# Patient Record
Sex: Male | Born: 1980 | Race: Black or African American | Hispanic: No | Marital: Married | Smoking: Current every day smoker
Health system: Southern US, Community
[De-identification: ages and names within clinical notes are randomized; demographics above are authoritative.]

## PROBLEM LIST (undated history)

## (undated) DIAGNOSIS — M5137 Other intervertebral disc degeneration, lumbosacral region: Secondary | ICD-10-CM

## (undated) DIAGNOSIS — T7840XA Allergy, unspecified, initial encounter: Secondary | ICD-10-CM

## (undated) DIAGNOSIS — F1190 Opioid use, unspecified, uncomplicated: Secondary | ICD-10-CM

## (undated) DIAGNOSIS — R519 Headache, unspecified: Secondary | ICD-10-CM

## (undated) DIAGNOSIS — R51 Headache: Secondary | ICD-10-CM

## (undated) DIAGNOSIS — M5441 Lumbago with sciatica, right side: Secondary | ICD-10-CM

## (undated) DIAGNOSIS — M5442 Lumbago with sciatica, left side: Secondary | ICD-10-CM

## (undated) DIAGNOSIS — G8929 Other chronic pain: Secondary | ICD-10-CM

## (undated) HISTORY — PX: BACK SURGERY: SHX140

## (undated) HISTORY — DX: Allergy, unspecified, initial encounter: T78.40XA

## (undated) HISTORY — DX: Other intervertebral disc degeneration, lumbosacral region: M51.37

## (undated) HISTORY — PX: FRACTURE SURGERY: SHX138

## (undated) HISTORY — DX: Opioid use, unspecified, uncomplicated: F11.90

## (undated) HISTORY — DX: Other chronic pain: G89.29

## (undated) HISTORY — DX: Lumbago with sciatica, right side: M54.41

## (undated) HISTORY — DX: Lumbago with sciatica, left side: M54.42

---
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
## 2002-10-06 ENCOUNTER — Emergency Department (HOSPITAL_COMMUNITY): Admission: EM | Admit: 2002-10-06 | Discharge: 2002-10-06 | Payer: Self-pay | Admitting: Emergency Medicine

## 2002-10-06 ENCOUNTER — Encounter: Payer: Self-pay | Admitting: Emergency Medicine

## 2006-11-13 ENCOUNTER — Emergency Department (HOSPITAL_COMMUNITY): Admission: EM | Admit: 2006-11-13 | Discharge: 2006-11-13 | Payer: Self-pay | Admitting: Emergency Medicine

## 2010-07-19 DIAGNOSIS — T7840XA Allergy, unspecified, initial encounter: Secondary | ICD-10-CM

## 2010-07-19 HISTORY — DX: Allergy, unspecified, initial encounter: T78.40XA

## 2014-05-23 DIAGNOSIS — G44011 Episodic cluster headache, intractable: Secondary | ICD-10-CM | POA: Insufficient documentation

## 2015-06-16 HISTORY — PX: SPINE SURGERY: SHX786

## 2015-08-12 ENCOUNTER — Encounter (HOSPITAL_COMMUNITY): Payer: Self-pay

## 2015-08-12 ENCOUNTER — Ambulatory Visit (HOSPITAL_COMMUNITY): Payer: BLUE CROSS/BLUE SHIELD | Attending: Neurosurgery

## 2015-08-12 DIAGNOSIS — M2560 Stiffness of unspecified joint, not elsewhere classified: Secondary | ICD-10-CM | POA: Insufficient documentation

## 2015-08-12 DIAGNOSIS — G8929 Other chronic pain: Secondary | ICD-10-CM | POA: Diagnosis present

## 2015-08-12 DIAGNOSIS — M545 Low back pain: Secondary | ICD-10-CM | POA: Insufficient documentation

## 2015-08-12 DIAGNOSIS — R269 Unspecified abnormalities of gait and mobility: Secondary | ICD-10-CM

## 2015-08-12 DIAGNOSIS — M6281 Muscle weakness (generalized): Secondary | ICD-10-CM | POA: Diagnosis present

## 2015-08-12 DIAGNOSIS — Z9889 Other specified postprocedural states: Secondary | ICD-10-CM | POA: Insufficient documentation

## 2015-08-12 DIAGNOSIS — M5126 Other intervertebral disc displacement, lumbar region: Secondary | ICD-10-CM | POA: Insufficient documentation

## 2015-08-12 NOTE — Patient Instructions (Signed)
  Isometric Transverse abdominal contraction  Lay on your back with your knees bent.    Place your thumbs on your stomach just inside your hip bones to feel the muscle contract.  Activate your abdominals by pulling everything in.  "Try to bring your naval to your spine."  Hold this contraction for 5 sec, 1 set of 15 reps, 2x/day   Learn to use this muscle with daily activities such as standing, walking, and rolling.        STRETCH - PIRIFORMIS  Start in a supine position ( on your back), one leg bent with foot flat on the bed/floor, and the other leg crossed over the knee.  Gently push the knee of the crossed leg forward until a strong yet pain-free stretch is felt.  Hold for 30 seconds then release.  Repeat 3 times and complete the stretch 2x/day      HAMSTRING STRETCH - SUPINE  While lying on your bed, hold the back of your knee/thigh area and straighten your knee until a stretch is felt along the back of your leg.Hold 30 sec and repeat 3 times in a row, Complete 2x/day

## 2015-08-13 ENCOUNTER — Encounter (HOSPITAL_COMMUNITY): Payer: Self-pay

## 2015-08-13 NOTE — Therapy (Signed)
[place] [facility] [address] [place], [place], [address] Phone: [phone]   Fax:  [phone]  Physical Therapy Evaluation  Patient Details  Name: ARRION BROADDUS MRN: [id number] Date of Birth: 21-Sep-1980 Referring Provider: Dr. Donalee Citrin  Encounter Date: 08/12/2015      PT End of Session - 08/12/15 1836    Visit Number 1   Number of Visits 13   Authorization Type [employer]    PT Start Time 1515   PT Stop Time 1620   PT Time Calculation (min) 65 min   Activity Tolerance Patient tolerated treatment well   Behavior During Therapy [facility] for tasks assessed/performed      Past Medical History  Diagnosis Date  . Allergy 2012    dogs/cats and bactirm    Past Surgical History  Procedure Laterality Date  . Fracture surgery Left     ORIF   . Spine surgery Right 06/16/2015    s/p lumbar laminectomy and microdisectomy at L5-S1 on the right    There were no vitals filed for this visit.  Visit Diagnosis:  S/P lumbar laminectomy - Plan: PT plan of care cert/re-cert  Disc displacement, lumbar - Plan: PT plan of care cert/re-cert  Chronic right-sided low back pain without sciatica - Plan: PT plan of care cert/re-cert  Muscle weakness - Plan: PT plan of care cert/re-cert  Joint stiffness of spine - Plan: PT plan of care cert/re-cert  Abnormality of gait - Plan: PT plan of care cert/re-cert      Subjective Assessment - 08/12/15 1536    Subjective Mr. Agard is a 35 yo male who c/o R-sdied LBP, low back stiffness, and generalized weakness s/p lumbar laminectomy and microdisectomy at L5-S1 on the right completed on 06/16/2015 by Dr. Wynetta Emery. Pt noted that he has not completed any formal therapy since his surgery and that he has been out of work as a result of his initial injury. Pt noted that he initially sustained his low back injury while lifting a tote that was ~30# at work on 03/24/2015. He participated with PT services for a  few weeks with symptoms worsening. Pt had a subsequent MRI that indicated a displaced disc in the lumbar spine that led him to the surgical procedure stated above. Pt noted that he has difficulty with long distance ambulation and household chores secondary to low back pain and weakness. He reports that he would like to return to work eventually. Pt was unable to recall his current pain meds and will bring them at his next visit. Pt further reported that he no longer experiences radicular pain into his R LE. According to the patient, his health benefits will drop at the beginning of February 2017 due to being out of work for an extended period of time; therefore, he further noted that he would not be able to return until the beginning of March 2017, which is when his new healthcare policy begins.    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 a few hours   How long can you stand comfortably? 30 minutes    How long can you walk comfortably? 5-10 minutes    Diagnostic tests MRI= herniated NP at L5-S1   Patient Stated Goals Pt's goal is to reduce his LBP, return to sports related activities, return to work, and to be able to play with his son.    Currently  in Pain? Yes   Pain Score 1   R-sided LBP ranges between a 1-7/10 on a VAS   Pain Location Back   Pain Orientation Posterior;Right   Pain Descriptors / Indicators Stabbing;Shooting   Pain Type Surgical pain   Pain Radiating Towards No radicular pain reported   Pain Onset Other (comment)  since surgery   Pain Frequency Intermittent   Aggravating Factors  walking, standing, bending, and sit<>stand transition    Pain Relieving Factors Prescribed pain meds, sitting, elevating legs, and biofreeze ointment    Effect of Pain on Daily Activities Pt reprots difficulty with household chores and lower body exercises.    Multiple Pain Sites No                                PT Education - 08/12/15 1552    Education provided Yes   Education Details Educated pt on log rolling technique, ice therapy (15-20 minutes, 3-4x/day), initial HEP, post-op restrictions, and PT findings    Person(s) Educated Patient   Methods Explanation;Demonstration   Comprehension Verbalized understanding;Returned demonstration          PT Short Term Goals - 08/13/15 0912    PT SHORT TERM GOAL #1   Title Patient will independently demo full understanding with initial HEP in order to progress towards improved function.    Time 2   Period Weeks   Status New   PT SHORT TERM GOAL #2   Title Patient will report decreased R-sided LBP  to 0-5/10 on a VAS in order to improve his ability to sleep with less than 2 interruptions/ night.   Baseline 2-3 interruptions/ night.   Time 3   Period Weeks   Status New   PT SHORT TERM GOAL #3   Title Patient will safely demo proper body mechanics with squats and log rolling in order to reduce stress on the lumbar spine with ADLs and household activities.    Time 2   Period Weeks   Status New           PT Long Term Goals - 08/13/15 0916    PT LONG TERM GOAL #1   Title Patient will report decreased back pain to 0-3/10 on a VAS in order to improve outdoor ambulation to >45 minutes.    Time 7   Period Weeks   Status New   PT LONG TERM GOAL #2   Title Patient will demo improved LE and core strength to 4+/5 MMT grade in order to improve tolerance with leisure activities.   Time 7   Period Weeks   Status New   PT LONG TERM GOAL #3   Title Patient will improve his FOTO to <40% limitation in order to progress towards his PLOF with ambulation, leisure activities, and household chores.    Time 7   Period Weeks   Status New   PT LONG TERM GOAL #4   Title Patient will be able to lift 15# from low counter with proper squatting technique in order to progress towards work related activities.     Time 7   Period Weeks   Status New   PT LONG TERM GOAL #5   Title Patient will improve lumbar spine AROM to [facility] in all directions within post-op restrictions.   Time 7   Period Weeks   Status New  Plan - 08/13/15 0749    Clinical Impression Statement Mr. Cruthfield is a 35 yo male who was referred to outpatient PT to initiate post-op rehab s/p lumbar laminectomy and microdisectomy at L5-S1 on the right with microdissection of the right S1 nerve root and microscopic discectomy. The patient presents with signs and symptoms that are consistent with post-op limitations s/p laminectomy and microdisectomy at L5-S1. He currently presents with impairments including R-sided LBP, impaired B hip/core weakness, limited lumbar AROM, impaired HS/quad/piriformis/ITB flexibility, and impaired posture. The pt denies radicular symptoms into either LE and noted that his radicular symptoms subsided after his surgery. The pt would benefit from skilled PT in order to improve postural alignment/awareness, improve lumbar spine AROM within post-op precautions, improve postural strength, improve hip strength, reduce LBP, and improve HS/quad/piriformis/ITB flexibility. In addition, pt would benefit from body mechanics education with lifting and work related activities in order to reduce risk for injury once returning back to work.    Pt will benefit from skilled therapeutic intervention in order to improve on the following deficits Abnormal gait;Decreased endurance;Hypomobility;Decreased scar mobility;Decreased strength;Pain;Decreased activity tolerance;Difficulty walking;Improper body mechanics;Decreased range of motion;Impaired flexibility;Postural dysfunction;Decreased mobility   Rehab Potential Good   Clinical Impairments Affecting Rehab Potential hx of lumbar disc dicplacement due to work related injury   PT Frequency 2x / week   PT Duration Other (comment)  7 weeks   PT Treatment/Interventions  Electrical Stimulation;Moist Heat;Gait training;Functional mobility training;Therapeutic activities;Therapeutic exercise;Manual techniques;Patient/family education;Scar mobilization;Passive range of motion;Taping   PT Next Visit Plan Next visit to focus on Tr abdominis activation in supine, piriformis/HS/quad stretches, education on log rolling, and manual therapy techaniues to reduce TPs. Update and review HEP. Review and add current meds to medical file.    PT Home Exercise Plan Provided this visit which includes Tr. abdominis level 1 in supine, piriformis stretch, and HS stretch in supine    Recommended Other Services None at this time    Consulted and Agree with Plan of Care Patient         Problem List There are no active problems to display for this patient.   Bonnee Quin, PT, DPT    08/13/2015, 12:55 PM  [facility] [address] [place], [place], [address] Phone: [phone]   Fax:  [phone]  Name: KOBEN DAMAN MRN: [id number] Date of Birth: 05/25/1981

## 2015-08-19 ENCOUNTER — Ambulatory Visit (HOSPITAL_COMMUNITY): Payer: BLUE CROSS/BLUE SHIELD

## 2015-08-19 DIAGNOSIS — Z9889 Other specified postprocedural states: Secondary | ICD-10-CM | POA: Diagnosis not present

## 2015-08-19 DIAGNOSIS — G8929 Other chronic pain: Secondary | ICD-10-CM

## 2015-08-19 DIAGNOSIS — M2560 Stiffness of unspecified joint, not elsewhere classified: Secondary | ICD-10-CM

## 2015-08-19 DIAGNOSIS — R269 Unspecified abnormalities of gait and mobility: Secondary | ICD-10-CM

## 2015-08-19 DIAGNOSIS — M545 Low back pain: Secondary | ICD-10-CM

## 2015-08-19 DIAGNOSIS — M6281 Muscle weakness (generalized): Secondary | ICD-10-CM

## 2015-08-19 DIAGNOSIS — M5126 Other intervertebral disc displacement, lumbar region: Secondary | ICD-10-CM

## 2015-08-19 NOTE — Therapy (Signed)
[place] [facility] [address] [place], [place], [address] Phone: [phone]   Fax:  [phone]  Physical Therapy Treatment  Patient Details  Name: Nathan Lambert MRN: [id number] Date of Birth: November 06, 1980 Referring Provider: Dr. Donalee Citrin  Encounter Date: 08/19/2015      PT End of Session - 08/19/15 1122    Visit Number 2   Number of Visits 13   Authorization Type [employer]    PT Start Time 1114   PT Stop Time 1200   PT Time Calculation (min) 46 min   Activity Tolerance Patient tolerated treatment well;No increased pain   Behavior During Therapy [facility] for tasks assessed/performed      Past Medical History  Diagnosis Date  . Allergy 2012    dogs/cats and bactirm    Past Surgical History  Procedure Laterality Date  . Fracture surgery Left     ORIF   . Spine surgery Right 06/16/2015    p lumbar laminectomy and microdisectomy at L5-S1 on the right    There were no vitals filed for this visit.  Visit Diagnosis:  S/P lumbar laminectomy  Disc displacement, lumbar  Chronic right-sided low back pain without sciatica  Muscle weakness  Joint stiffness of spine  Abnormality of gait      Subjective Assessment - 08/19/15 1117    Subjective Pt reported that his low back "feels stiff" with pain rated a 2/10 on a VAS upon arrival. R-sided LBP has ranged between a 1-7/10 on a VAS since last PT visit. Pt noted that he will not be able to return to outpatient PT until March due to issues with his insurance. Pt will plan to call the clinic back once his insurance reinitiates.    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 a few hours   How long can you stand comfortably? 30 minutes    How long can you walk comfortably? 5-10 minutes    Diagnostic tests MRI= herniated NP at L5-S1   Patient Stated Goals Pt's goal is to reduce his LBP, return to sports  related activities, return to work, and to be able to play with his son.    Currently in Pain? Yes   Pain Score 2    Pain Location Back   Pain Orientation Right   Pain Descriptors / Indicators Aching;Sharp   Pain Type Surgical pain   Pain Onset Other (comment)  Since surgery    Pain Frequency Intermittent   Aggravating Factors  walking, standing, and sitting for prolonged periods of time    Pain Relieving Factors pain meds, sitting, elevating legs, and biofreeze ointment    Effect of Pain on Daily Activities Pt reports continued difficulty with household chores   Multiple Pain Sites No                         [facility] Adult PT Treatment/Exercise - 08/19/15 0001    Lumbar Exercises: Stretches   Active Hamstring Stretch 3 reps;30 seconds;Other (comment)  bilateral    Single Knee to Chest Stretch 3 reps;30 seconds;Other (comment)  bilateral    Lower Trunk Rotation Other (comment)  1 set of 15 reps in supine   Piriformis Stretch 3 reps;30 seconds;Other (comment)  bilateral    Lumbar Exercises: Aerobic   Stationary Bike 5 minutes on level 1  at beginning of PT tx  Lumbar Exercises: Standing   Functional Squats 15 reps;Other (comment)  1 set with use of physioball in corner of room   Lumbar Exercises: Supine   Ab Set 5 seconds;20 reps   Large Ball Abdominal Isometric 15 reps;3 seconds   Manual Therapy   Passive ROM B hip PROM into flex/IR/ER x 15 reps each                 PT Education - 08/19/15 1121    Education provided Yes   Education Details Educated pt on log rolling technique, post-op restrictions, PT eval findings,updated HEP,  and heat therapy 15-20 minutes    Person(s) Educated Patient   Methods Explanation;Demonstration;Handout   Comprehension Verbalized understanding;Returned demonstration;Need further instruction          PT Short Term Goals - 08/13/15 0912    PT SHORT TERM GOAL #1   Title Patient will independently demo full  understanding with initial HEP in order to progress towards improved function.    Time 2   Period Weeks   Status New   PT SHORT TERM GOAL #2   Title Patient will report decreased R-sided LBP  to 0-5/10 on a VAS in order to improve his ability to sleep with less than 2 interruptions/ night.   Baseline 2-3 interruptions/ night.   Time 3   Period Weeks   Status New   PT SHORT TERM GOAL #3   Title Patient will safely demo proper body mechanics with squats and log rolling in order to reduce stress on the lumbar spine with ADLs and household activities.    Time 2   Period Weeks   Status New           PT Long Term Goals - 08/13/15 0916    PT LONG TERM GOAL #1   Title Patient will report decreased back pain to 0-3/10 on a VAS in order to improve outdoor ambulation to >45 minutes.    Time 7   Period Weeks   Status New   PT LONG TERM GOAL #2   Title Patient will demo improved LE and core strength to 4+/5 MMT grade in order to improve tolerance with leisure activities.   Time 7   Period Weeks   Status New   PT LONG TERM GOAL #3   Title Patient will improve his FOTO to <40% limitation in order to progress towards his PLOF with ambulation, leisure activities, and household chores.    Time 7   Period Weeks   Status New   PT LONG TERM GOAL #4   Title Patient will be able to lift 15# from low counter with proper squatting technique in order to progress towards work related activities.    Time 7   Period Weeks   Status New   PT LONG TERM GOAL #5   Title Patient will improve lumbar spine AROM to [facility] in all directions within post-op restrictions.   Time 7   Period Weeks   Status New               Plan - 08/19/15 1123    Clinical Impression Statement Initiated PT tx with 5 minute warm-up on the recumbent bike with good tolerance reported. Pt tolerated progressed core stabilization ther ex with addition of physio ball abdominal isometric in supine. SKTC, LTR, and mini squats were  added to ther ex regimen with good tolerance reported. Pt required verbal and tactile cues for proper technique and to maintain core activated.  HEP was updated this visit with addition of SKTC and mini squats. LBP was reduced to 1/10 on a VAS at the completion of PT tx. Pt is responding well to current PT POC and would benefit from continued skilled PT to further improve B LE/core strength, LE flexibility, lumbar ROM, and reduce pain. Per pt report, he will not be able to return to outpatient PT until March due to his current insurance being discontinued by his employer. Pt plans to return once his new health insurance policy begins. Pt will continue with his updated HEP until then. Excellent understanding demo and verbalized per pt with HEP.    Pt will benefit from skilled therapeutic intervention in order to improve on the following deficits Abnormal gait;Decreased endurance;Hypomobility;Decreased scar mobility;Decreased strength;Pain;Decreased activity tolerance;Difficulty walking;Improper body mechanics;Decreased range of motion;Impaired flexibility;Postural dysfunction;Decreased mobility   Rehab Potential Good   Clinical Impairments Affecting Rehab Potential hx of lumbar disc dicplacement due to work related injury   PT Frequency 2x / week   PT Duration Other (comment)  7 weeks   PT Treatment/Interventions Electrical Stimulation;Moist Heat;Gait training;Functional mobility training;Therapeutic activities;Therapeutic exercise;Manual techniques;Patient/family education;Scar mobilization;Passive range of motion;Taping   PT Next Visit Plan Next visit to focus on Tr abdominis activation in supine, piriformis/HS/quad stretches, education on log rolling, and manual therapy techaniues to reduce TPs. Update and review HEP. Review and add current meds to medical file.    PT Home Exercise Plan Reviewed HEP with addition of mini squats and SKTC stretch. Handout provided this visit with additional instructions  provided on proper technique and parameters.    Recommended Other Services None at time    Consulted and Agree with Plan of Care Patient        Problem List There are no active problems to display for this patient.  Bonnee Quin, PT, DPT    08/19/2015, 12:24 PM  [place] [address] [place], [place], [address] Phone: [phone]   Fax:  [phone]  Name: Nathan Lambert MRN: [id number] Date of Birth: 07-10-1981

## 2015-08-19 NOTE — Patient Instructions (Addendum)
  SINGLE KNEE TO CHEST STRETCH - SKTC  While Lying on your back,  hold your knee and gently pull it up towards your chest. Complete 1 set of 15 reps on both sides, complete 2-3x/day, everyday    EXERCISE BALL - PARTIAL WALL SQUATS  Start by standing up and leaning your low back up against an exercise ball in the corner of a room for safety. Your feet should be spread apart about shoulder width apart.   Next, slowly bend your knees and lower your buttocks towards the floor, but only go down to ~45 degrees/partial squat. DO NOT GO AS FAR DOWN AS THE PICTURE  Knees should bend in line with the 2nd toe and not pass the front of the foot. Complete 10-20 reps, 1-2x/day, everyday with focus on keeping your core engaged during the movement STOP the exercises if you experience pain

## 2015-09-23 ENCOUNTER — Encounter (HOSPITAL_COMMUNITY): Payer: Self-pay

## 2015-09-23 ENCOUNTER — Ambulatory Visit (HOSPITAL_COMMUNITY): Payer: BLUE CROSS/BLUE SHIELD | Attending: Neurosurgery

## 2015-09-23 DIAGNOSIS — Z9889 Other specified postprocedural states: Secondary | ICD-10-CM | POA: Diagnosis not present

## 2015-09-23 DIAGNOSIS — M545 Low back pain: Secondary | ICD-10-CM | POA: Insufficient documentation

## 2015-09-23 DIAGNOSIS — R269 Unspecified abnormalities of gait and mobility: Secondary | ICD-10-CM | POA: Diagnosis present

## 2015-09-23 DIAGNOSIS — M6281 Muscle weakness (generalized): Secondary | ICD-10-CM | POA: Diagnosis present

## 2015-09-23 DIAGNOSIS — G8929 Other chronic pain: Secondary | ICD-10-CM | POA: Insufficient documentation

## 2015-09-23 DIAGNOSIS — M2560 Stiffness of unspecified joint, not elsewhere classified: Secondary | ICD-10-CM | POA: Insufficient documentation

## 2015-09-23 DIAGNOSIS — M5126 Other intervertebral disc displacement, lumbar region: Secondary | ICD-10-CM | POA: Insufficient documentation

## 2015-09-23 NOTE — Therapy (Signed)
[facility] [address] [place], [place], [address] Phone: [phone]   Fax:  [phone]  Physical Therapy Treatment/ Physical Therapy Reassessment   Patient Details  Name: Nathan Lambert MRN: [id number] Date of Birth: Jan 31, 1981 Referring Provider: Dr. Donalee Citrin   Encounter Date: 09/23/2015      PT End of Session - 09/23/15 0944    Visit Number 3   Number of Visits 13   Date for PT Re-Evaluation 10/23/15   Authorization Type BCBS    Authorization Time Period 09/23/2015 to 11/11/2015   PT Start Time 0935   PT Stop Time 1025   PT Time Calculation (min) 50 min   Activity Tolerance Patient tolerated treatment well   Behavior During Therapy [facility] for tasks assessed/performed      Past Medical History  Diagnosis Date  . Allergy 2012    dogs/cats and bactirm    Past Surgical History  Procedure Laterality Date  . Fracture surgery Left     ORIF   . Spine surgery Right 06/16/2015    p lumbar laminectomy and microdisectomy at L5-S1 on the right    There were no vitals filed for this visit.  Visit Diagnosis:  S/P lumbar laminectomy - Plan: PT plan of care cert/re-cert  Disc displacement, lumbar - Plan: PT plan of care cert/re-cert  Chronic right-sided low back pain without sciatica - Plan: PT plan of care cert/re-cert  Muscle weakness - Plan: PT plan of care cert/re-cert  Joint stiffness of spine - Plan: PT plan of care cert/re-cert  Abnormality of gait - Plan: PT plan of care cert/re-cert      Subjective Assessment - 09/23/15 0936    Subjective Pt stated that his new health insurance ([employer]) began the beginning of this month and has returned to continue with PT services. No significant changes or improvements reported since last PT visit. Pt rated his R-sided LBP a 5-6/10 on a VAS upon arrival. Pt stated that he had a f/u appt with Dr. Wynetta Emery a few weeks ago and was told to continue with PT. Furthermore, he was instructed  by Dr. Wynetta Emery to avoid twisting and forward bending per pt report. Pt noted good compliance with his HEP, but verbalized incorrect parameters upon questioning.    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 a few hours   How long can you stand comfortably? 45 minutes    How long can you walk comfortably? 20 minutes    Diagnostic tests MRI= herniated NP at L5-S1   Patient Stated Goals Pt's goal is to reduce his LBP, return to sports related activities, return to work, and to be able to play with his son.    Currently in Pain? Yes   Pain Score 6   R-sided LBP ranges between a 2-10/10 on a VAS    Pain Location Back   Pain Orientation Right   Pain Descriptors / Indicators Aching;Sharp   Pain Type Surgical pain   Pain Radiating Towards No radicualr pain reported    Pain Onset More than a month ago   Pain Frequency Intermittent   Aggravating Factors  walking, standing, and sitting for prolonged periods of time   Pain Relieving Factors pain meds, sitting, and elevating B LE   Effect of Pain on Daily Activities Pt reports continued difficulty with household chores    Multiple Pain Sites No  [facility] PT Assessment - 09/23/15 0001    Assessment   Medical Diagnosis Lumbago   Referring Provider Dr. Donalee CitrinGary Cram    Onset Date/Surgical Date 06/16/15   Hand Dominance Right   Next MD Visit 11/02/2015   Prior Therapy PT with different clinic prior to his recent surgery    Precautions   Precautions Back  s/p Laminectomy and microdisectomy   Restrictions   Weight Bearing Restrictions No   Prior Function   Level of Independence Independent   Vocation On disability  short-term    Cognition   Overall Cognitive Status Within Functional Limits for tasks assessed   Attention Focused   Focused Attention Appears intact   Memory Appears intact   Awareness Appears intact   Problem Solving Appears intact    Observation/Other Assessments   Focus on Therapeutic Outcomes (FOTO)  Not completed this vist   Sensation   Light Touch Appears Intact   ROM / Strength   AROM / PROM / Strength AROM   AROM   AROM Assessment Site Lumbar   Lumbar Flexion NT   Lumbar Extension 8   Lumbar - Right Side Bend 10   Lumbar - Left Side Bend 10   Strength   Right Hip Flexion 4/5   Right Hip Extension 3/5   Right Hip ABduction 4/5   Left Hip Flexion 5/5   Left Hip Extension 3/5   Left Hip ABduction 4/5   Right Knee Flexion 5/5   Right Knee Extension 5/5   Left Knee Flexion 5/5   Left Knee Extension 5/5   Right Ankle Dorsiflexion 5/5   Right Ankle Plantar Flexion 5/5   Right Ankle Inversion 5/5   Right Ankle Eversion 5/5   Left Ankle Dorsiflexion 5/5   Left Ankle Plantar Flexion 5/5   Left Ankle Inversion 5/5   Left Ankle Eversion 5/5   Flexibility   Hamstrings -10 deg, bilateral    Quadriceps 50% limited   Piriformis 50% limited, bilateral           Transfers   Transfers Sit to Stand;Stand to Sit;Supine to Sit;Sit to Supine   Sit to Stand 7: Independent   Stand to Sit 7: Independent   Supine to Sit 5: Supervision   Supine to Sit Details Other (comment)  cues for log rolling   Sit to Supine 5: Supervision   Ambulation/Gait   Gait Pattern --  Right                     [facility] Adult PT Treatment/Exercise - 09/23/15 0001    Lumbar Exercises: Stretches   Passive Hamstring Stretch 3 reps;30 seconds   Passive Hamstring Stretch Limitations bilateral in supine position    Single Knee to Chest Stretch 3 reps;30 seconds;Other (comment)  bilateral    Lower Trunk Rotation Other (comment)  1 set of 15 reps in supine   Piriformis Stretch 3 reps;30 seconds;Other (comment)  bilateral    Lumbar Exercises: Aerobic   Stationary Bike 5 minutes on level 2  at end of PT tx           Lumbar Exercises: Supine   Ab Set 5 seconds;10 reps   Bent Knee Raise 15 reps;5 seconds  Core bracing    Bent Knee Raise Limitations with alternating shoulder flexion    Large Ball Abdominal Isometric 15 reps;3 seconds   Lumbar Exercises: Sidelying   Clam 15 reps;Other (comment)  bilateral    Clam Limitations red thera-band  Modalities   Modalities Moist Heat   Moist Heat Therapy   Number Minutes Moist Heat 10 Minutes  in seated position at the end of PT tx; Skin was WNL   Moist Heat Location Lumbar Spine   Manual Therapy   Passive ROM B hip PROM into flex/IR/ER x 15 reps each                 PT Education - 09/23/15 0942    Education provided Yes   Education Details Educated pt on log rolling, post-op restrictions,current HEP, and heat therapy 15-20 minutes, 3-4x/day to manage pain and muscle spasms    Person(s) Educated Patient   Methods Explanation;Demonstration   Comprehension Verbalized understanding;Returned demonstration          PT Short Term Goals - 09/23/15 1009    PT SHORT TERM GOAL #1   Title Patient will independently demo full understanding with initial HEP in order to progress towards improved function.    Baseline Pt required vebal cues for proper technique and parameters   Time 2   Period Weeks   Status On-going   PT SHORT TERM GOAL #2   Title Patient will report decreased R-sided LBP  to 0-5/10 on a VAS in order to improve his ability to sleep with less than 2 interruptions/ night.   Baseline 2-3 interruptions/ night.   Time 3   Period Weeks   Status On-going   PT SHORT TERM GOAL #3   Title Patient will safely demo proper body mechanics with squats and log rolling in order to reduce stress on the lumbar spine with ADLs and household activities.    Baseline Fair technique demo with log rolling with verbal cues to keep core braced    Time 2   Period Weeks   Status On-going           PT Long Term Goals - 09/23/15 1010    PT LONG TERM GOAL #1   Title Patient will report decreased back pain to 0-3/10 on a VAS in order to improve outdoor  ambulation to >45 minutes.    Baseline LBP ranges between a 2-10/10 on a VAS   Time 7   Period Weeks   Status On-going   PT LONG TERM GOAL #2   Title Patient will demo improved LE and core strength to 4+/5 MMT grade in order to improve tolerance with leisure activities.   Time 7   Period Weeks   Status On-going   PT LONG TERM GOAL #3   Title Patient will improve his FOTO to <40% limitation in order to progress towards his PLOF with ambulation, leisure activities, and household chores.    Baseline Not formally assessed this visit   Time 7   Period Weeks   Status On-going   PT LONG TERM GOAL #4   Title Patient will be able to lift 15# from low counter with proper squatting technique in order to progress towards work related activities.    Baseline Not formally assessed this visit   Time 7   Period Weeks   Status On-going   PT LONG TERM GOAL #5   Title Patient will improve lumbar spine AROM to [facility] in all directions within post-op restrictions.   Time 7   Period Weeks   Status On-going               Plan - 09/23/15 0945    Clinical Impression Statement Mr. Thrun is a 35 yo male who  has not been seen at PT for the last month due to his health insurance expiring; however, the pt returns to PT to resume with his POC with new health insurance in effect since September 17, 2015. Mr. Jeraldine Loots was initially referred to outpatient PT to s/p lumbar laminectomy and microdisectomy at L5-S1 on the right with microdissection of the right S1 nerve root and microscopic discectomy. The pt has demo slight improvement with B hip strength and less guarded movements with functional mobility activities. In addition, his B HS flexibility has improved. Minor progress assessed towards stated goals.  He continues to present with signs and symptoms that are consistent with post-op limitations s/p laminectomy and microdisectomy at L5-S1 including R-sided LBP, impaired B hip/core weakness, limited lumbar  AROM, impaired HS/quad/piriformis/ITB flexibility, and impaired posture. The pt denies radicular symptoms into either LE. PT tx was focused on progressed core stabilization in supine with alternating hip/shoulder flexion and resisted hip ER in S/L position. Pt tolerated progressed ther ex with no issues reported per pt. Pt required tactile and verbal cues for proper core activation and to avoid holding his breath. Improved technique assessed with subsequent reps. R-sided LBP was rated a 5/10 on a VAS at the completion of today's PT tx. Initiated moist heat to the lumbar spine region in order to reduce muscle guarding of erector spinae and to reduce pain. Pt noted minor relief post moist heat tx. Pt would benefit from continued skilled PT to focus on core stabilization/strengthening ther ex, hip strengthening, B LE stretches, education on body mechanics, and lumbar ROM within post-op protocol/MD orders. Pt is in agreement with continued skilled PT with current POC.    Pt will benefit from skilled therapeutic intervention in order to improve on the following deficits Abnormal gait;Decreased endurance;Hypomobility;Decreased scar mobility;Decreased strength;Pain;Decreased activity tolerance;Difficulty walking;Improper body mechanics;Decreased range of motion;Impaired flexibility;Postural dysfunction;Decreased mobility   Rehab Potential Good   Clinical Impairments Affecting Rehab Potential hx of lumbar disc dicplacement due to work related injury   PT Frequency 2x / week   PT Duration Other (comment)  7 weeks    PT Treatment/Interventions Electrical Stimulation;Moist Heat;Gait training;Functional mobility training;Therapeutic activities;Therapeutic exercise;Manual techniques;Patient/family education;Scar mobilization;Passive range of motion;Taping   PT Next Visit Plan Next visit to focus on Tr abdominis activation in supine, piriformis/HS/quad stretches, education on log rolling, and manual therapy techaniues to  reduce TPs. Update and review HEP. Review and add current meds to medical file.    PT Home Exercise Plan Reviewed HEP with no changes made this visit    Consulted and Agree with Plan of Care Patient        Problem List There are no active problems to display for this patient.   Bonnee Quin, PT, DPT   09/23/2015, 12:55 PM  [facility] [address] [place], [place], [address] Phone: [phone]   Fax:  [phone]  Name: UZOMA VIVONA MRN: [id number] Date of Birth: 12-23-1980

## 2015-09-25 ENCOUNTER — Ambulatory Visit (HOSPITAL_COMMUNITY): Payer: BLUE CROSS/BLUE SHIELD

## 2015-09-25 DIAGNOSIS — R269 Unspecified abnormalities of gait and mobility: Secondary | ICD-10-CM

## 2015-09-25 DIAGNOSIS — M545 Low back pain: Secondary | ICD-10-CM

## 2015-09-25 DIAGNOSIS — Z9889 Other specified postprocedural states: Secondary | ICD-10-CM

## 2015-09-25 DIAGNOSIS — M2560 Stiffness of unspecified joint, not elsewhere classified: Secondary | ICD-10-CM

## 2015-09-25 DIAGNOSIS — M6281 Muscle weakness (generalized): Secondary | ICD-10-CM

## 2015-09-25 DIAGNOSIS — M5126 Other intervertebral disc displacement, lumbar region: Secondary | ICD-10-CM

## 2015-09-25 DIAGNOSIS — G8929 Other chronic pain: Secondary | ICD-10-CM

## 2015-09-25 NOTE — Therapy (Signed)
[facility] [address] [place], [place], [address] Phone: [phone]   Fax:  [phone]  Physical Therapy Treatment  Patient Details  Name: Nathan Lambert MRN: [id number] Date of Birth: 1980/11/19 Referring Provider: Dr. Donalee Citrin   Encounter Date: 09/25/2015      PT End of Session - 09/25/15 0950    Visit Number 4   Number of Visits 13   Date for PT Re-Evaluation 10/23/15   Authorization Type BC[employer]    Authorization Time Period 09/23/2015 to 11/11/2015   PT Start Time 0933   PT Stop Time 1025   PT Time Calculation (min) 52 min   Activity Tolerance Patient tolerated treatment well   Behavior During Therapy [facility] for tasks assessed/performed      Past Medical History  Diagnosis Date  . Allergy 2012    dogs/cats and bactirm    Past Surgical History  Procedure Laterality Date  . Fracture surgery Left     ORIF   . Spine surgery Right 06/16/2015    p lumbar laminectomy and microdisectomy at L5-S1 on the right    There were no vitals filed for this visit.  Visit Diagnosis:  S/P lumbar laminectomy  Disc displacement, lumbar  Chronic right-sided low back pain without sciatica  Muscle weakness  Joint stiffness of spine  Abnormality of gait      Subjective Assessment - 09/25/15 0941    Subjective LBP rated a 4-5/10 on a VAS upon arrival with c/o low back stiffness. No changes noted since last PT visit. Pt reported good tolerance with last PT visit with no residual soreness experienced. Pt noted that he forgot to bring in his med list and will plan to bring it to his next PT visit.    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 a few hours   How long can you stand comfortably? 45 minutes    How long can you walk comfortably? 20 minutes    Diagnostic tests MRI= herniated NP at L5-S1   Patient Stated Goals Pt's goal is to reduce his LBP,  return to sports related activities, return to work, and to be able to play with his son.    Pain Score 5   R-sided LBP has ranged between 2-7/10 on a VAS since last PT visit    Pain Location Back   Pain Orientation Right   Pain Descriptors / Indicators Aching   Pain Type Surgical pain   Pain Radiating Towards No radicular pain    Pain Onset More than a month ago   Pain Frequency Intermittent   Aggravating Factors  walking, standing, and sitting for prolonged periods of time    Pain Relieving Factors lying in supine, pain meds, and LE elevation    Effect of Pain on Daily Activities limited with household chores and long distance ambulation    Multiple Pain Sites No                         [facility] Adult PT Treatment/Exercise - 09/25/15 0001                                      Lumbar Exercises: Stretches   Passive Hamstring Stretch 3 reps;30 seconds   Passive Hamstring Stretch Limitations bilateral in supine  position    Single Knee to Chest Stretch 3 reps;30 seconds;Other (comment)  bilateral    Lower Trunk Rotation Other (comment)  1 set of 15 reps in supine   Piriformis Stretch 3 reps;30 seconds;Other (comment)  bilateral    Lumbar Exercises: Aerobic   Stationary Bike 6 minutes on level 3  at end of PT tx    Lumbar Exercises: Standing   Functional Squats 15 reps;Other (comment)  1 set with use of physioball in corner of room   Lumbar Exercises: Supine   Ab Set 5 seconds;15 reps   Bent Knee Raise 15 reps;5 seconds  Core bracing   Bent Knee Raise Limitations with alternating shoulder and hip flexion        Lumbar Exercises: Sidelying   Clam 15 reps;Other (comment)  bilateral    Clam Limitations red thera-band           Modalities   Modalities Moist Heat   Moist Heat Therapy   Number Minutes Moist Heat 10 Minutes  at the end of PT tx    Moist Heat Location Lumbar Spine   Manual Therapy   Manual Therapy Soft tissue mobilization   Manual therapy  comments Completed independently from other interventions    Soft tissue mobilization STM to bilateral paraspinal muscles with palmar spreading technique    Passive ROM B hip PROM into flex/IR/ER x 15 reps each                 PT Education - 09/25/15 0949    Education provided Yes   Education Details Educated pt on log rolling technique, post-op restrictions, current HEP, avoid prolonged sitting, and heat therapy    Person(s) Educated Patient   Methods Explanation;Demonstration   Comprehension Returned demonstration;Verbalized understanding          PT Short Term Goals - 09/23/15 1009    PT SHORT TERM GOAL #1   Title Patient will independently demo full understanding with initial HEP in order to progress towards improved function.    Baseline Pt required vebal cues for proper technique and parameters   Time 2   Period Weeks   Status On-going   PT SHORT TERM GOAL #2   Title Patient will report decreased R-sided LBP  to 0-5/10 on a VAS in order to improve his ability to sleep with less than 2 interruptions/ night.   Baseline 2-3 interruptions/ night.   Time 3   Period Weeks   Status On-going   PT SHORT TERM GOAL #3   Title Patient will safely demo proper body mechanics with squats and log rolling in order to reduce stress on the lumbar spine with ADLs and household activities.    Baseline Fair technique demo with log rolling with verbal cues to keep core braced    Time 2   Period Weeks   Status On-going           PT Long Term Goals - 09/23/15 1010    PT LONG TERM GOAL #1   Title Patient will report decreased back pain to 0-3/10 on a VAS in order to improve outdoor ambulation to >45 minutes.    Baseline LBP ranges between a 2-10/10 on a VAS   Time 7   Period Weeks   Status On-going   PT LONG TERM GOAL #2   Title Patient will demo improved LE and core strength to 4+/5 MMT grade in order to improve tolerance with leisure activities.   Time 7  Period Weeks    Status On-going   PT LONG TERM GOAL #3   Title Patient will improve his FOTO to <40% limitation in order to progress towards his PLOF with ambulation, leisure activities, and household chores.    Baseline Not formally assessed this visit   Time 7   Period Weeks   Status On-going   PT LONG TERM GOAL #4   Title Patient will be able to lift 15# from low counter with proper squatting technique in order to progress towards work related activities.    Baseline Not formally assessed this visit   Time 7   Period Weeks   Status On-going   PT LONG TERM GOAL #5   Title Patient will improve lumbar spine AROM to [facility] in all directions within post-op restrictions.   Time 7   Period Weeks   Status On-going               Plan - 09/25/15 5409    Clinical Impression Statement PT tx focused on LE stretches, core stabilization ther ex, gluteal strengthening, and manual techniques to reduce pain and TPs. Palpable tenderness assessed t/o lumbar paraspinals and quadratus lumborum. Added and completed STM to lumbar paraspinals in L S/L position with TPs reduced. Improved technique and understanding demo with supine core bracing ther ex with minor cues required for proper sequence and to avoid holding breath. Therapist encouraged and instructed the pt to avoid long durations with sitting and to ambulate tolerable distances daily in order to improve tolerance with community ambulation. Pt verbalized full understanding and admitted that he watches television and plays video games for the majority of the day. Reintroduced mini squats with physioball on wall with fair tolerance reported. Pt required verbal and visual cues for proper technique. Improved tolerance reported once cues were provided. Ended PT tx with moist heat therapy to the lumbar spine in a seated position. LBP was rated a 4-5/10 on a VAS at the end of today's visit. Continue with current POC.    Pt will benefit from skilled therapeutic intervention  in order to improve on the following deficits Abnormal gait;Decreased endurance;Hypomobility;Decreased scar mobility;Decreased strength;Pain;Decreased activity tolerance;Difficulty walking;Improper body mechanics;Decreased range of motion;Impaired flexibility;Postural dysfunction;Decreased mobility   Rehab Potential Good   Clinical Impairments Affecting Rehab Potential hx of lumbar disc dicplacement due to work related injury   PT Frequency 2x / week   PT Duration Other (comment)  7 weeks    PT Treatment/Interventions Electrical Stimulation;Moist Heat;Gait training;Functional mobility training;Therapeutic activities;Therapeutic exercise;Manual techniques;Patient/family education;Scar mobilization;Passive range of motion;Taping   PT Next Visit Plan Next visit to focus on Tr abdominis activation in supine, piriformis/HS/quad stretches, manual therapy techaniues to reduce TPs, and IFC e-stim to reduce pain. Update and review HEP. Review and add current meds to medical file once pt brings in med list.    PT Home Exercise Plan Reviewed HEP with addition of alternating hip and shoulder flexion with core stabilization in supine    Consulted and Agree with Plan of Care Patient        Problem List There are no active problems to display for this patient.   Bonnee Quin, PT, DPT  09/25/2015, 12:33 PM  [facility] [address] [place], [place], [address] Phone: [phone]   Fax:  [phone]  Name: ALDOUS HOUSEL MRN: [id number] Date of Birth: Feb 25, 1981

## 2015-09-30 ENCOUNTER — Ambulatory Visit (HOSPITAL_COMMUNITY): Payer: BLUE CROSS/BLUE SHIELD | Admitting: Physical Therapy

## 2015-09-30 DIAGNOSIS — M545 Low back pain, unspecified: Secondary | ICD-10-CM

## 2015-09-30 DIAGNOSIS — Z9889 Other specified postprocedural states: Secondary | ICD-10-CM | POA: Diagnosis not present

## 2015-09-30 DIAGNOSIS — M6281 Muscle weakness (generalized): Secondary | ICD-10-CM

## 2015-09-30 DIAGNOSIS — M5126 Other intervertebral disc displacement, lumbar region: Secondary | ICD-10-CM

## 2015-09-30 DIAGNOSIS — R269 Unspecified abnormalities of gait and mobility: Secondary | ICD-10-CM

## 2015-09-30 DIAGNOSIS — M2560 Stiffness of unspecified joint, not elsewhere classified: Secondary | ICD-10-CM

## 2015-09-30 DIAGNOSIS — G8929 Other chronic pain: Secondary | ICD-10-CM

## 2015-09-30 NOTE — Therapy (Signed)
[place] [facility] [address] [place], [place], [address] Phone: [phone]   Fax:  [phone]  Physical Therapy Treatment  Patient Details  Name: Nathan Lambert MRN: [id number] Date of Birth: 06-18-1981 Referring Provider: Dr. Donalee Citrin   Encounter Date: 09/30/2015      PT End of Session - 09/30/15 1204    Visit Number 5   Number of Visits 13   Date for PT Re-Evaluation 10/23/15   Authorization Type BCBS    Authorization Time Period 09/23/2015 to 11/11/2015   PT Start Time 1107   PT Stop Time 1151   PT Time Calculation (min) 44 min   Activity Tolerance Patient tolerated treatment well   Behavior During Therapy [facility] for tasks assessed/performed      Past Medical History  Diagnosis Date  . Allergy 2012    dogs/cats and bactirm    Past Surgical History  Procedure Laterality Date  . Fracture surgery Left     ORIF   . Spine surgery Right 06/16/2015    p lumbar laminectomy and microdisectomy at L5-S1 on the right    There were no vitals filed for this visit.  Visit Diagnosis:  S/P lumbar laminectomy  Disc displacement, lumbar  Chronic right-sided low back pain without sciatica  Muscle weakness  Joint stiffness of spine  Abnormality of gait      Subjective Assessment - 09/30/15 1108    Subjective Trying to walk more but it was really tough. Able to walk for about 10 minutes before back tightens up and then has to sit down. Trying to get up about every 30 minutes.    Currently in Pain? Yes   Pain Score 5    Pain Location Back   Pain Orientation Lower;Medial   Pain Descriptors / Indicators Aching   Pain Type Surgical pain   Pain Radiating Towards no radicular pain   Aggravating Factors  walking, laying flat, staying in one position too long   Pain Relieving Factors sit in recliner, pain pills                         [facility] Adult PT Treatment/Exercise - 09/30/15 0001    Ambulation/Gait   Ambulation/Gait Yes   Ambulation/Gait Assistance 7: Independent   Ambulation Distance (Feet) 250 Feet   Gait Comments Lt leg external rotation, minimal trunk rotation and arm swing. Encouraging core activation during ambulation   Posture/Postural Control   Posture Comments Discussing posture and positioning at home. Pt describes maintaining lateral shift Lt. Encouraging neurtal position when sitting.    Exercises   Other Exercises  seated abdominal activation 1x10   Lumbar Exercises: Stretches   Active Hamstring Stretch 3 reps;30 seconds;Other (comment)  cues for core activation when lifting/lowering   Passive Hamstring Stretch 3 reps;30 seconds   Passive Hamstring Stretch Limitations bilateral in supine position    Single Knee to Chest Stretch 3 reps;30 seconds;Other (comment)  bilateral    Piriformis Stretch 3 reps;30 seconds;Other (comment)  bilateral    Lumbar Exercises: Seated   Sit to Stand 5 reps   Sit to Stand Limitations focus on core activation.   Lumbar Exercises: Supine   Ab Set 5 seconds;15 reps;Other (comment)  1X15, 1X10   Bent Knee Raise 10 reps;3 seconds   Bent Knee Raise Limitations cues for abdominal activation                PT Education - 09/30/15 1203  Education provided Yes   Education Details education on anatomy of spine and core activation to stabilize, seated abdominal activation, ambulation with activation. Encouraging ambulation and positioning.    Person(s) Educated Patient   Methods Explanation;Demonstration;Tactile cues;Verbal cues   Comprehension Verbalized understanding;Returned demonstration          PT Short Term Goals - 09/23/15 1009    PT SHORT TERM GOAL #1   Title Patient will independently demo full understanding with initial HEP in order to progress towards improved function.    Baseline Pt required vebal cues for proper technique and parameters   Time 2   Period Weeks   Status On-going   PT SHORT TERM GOAL #2   Title  Patient will report decreased R-sided LBP  to 0-5/10 on a VAS in order to improve his ability to sleep with less than 2 interruptions/ night.   Baseline 2-3 interruptions/ night.   Time 3   Period Weeks   Status On-going   PT SHORT TERM GOAL #3   Title Patient will safely demo proper body mechanics with squats and log rolling in order to reduce stress on the lumbar spine with ADLs and household activities.    Baseline Fair technique demo with log rolling with verbal cues to keep core braced    Time 2   Period Weeks   Status On-going           PT Long Term Goals - 09/23/15 1010    PT LONG TERM GOAL #1   Title Patient will report decreased back pain to 0-3/10 on a VAS in order to improve outdoor ambulation to >45 minutes.    Baseline LBP ranges between a 2-10/10 on a VAS   Time 7   Period Weeks   Status On-going   PT LONG TERM GOAL #2   Title Patient will demo improved LE and core strength to 4+/5 MMT grade in order to improve tolerance with leisure activities.   Time 7   Period Weeks   Status On-going   PT LONG TERM GOAL #3   Title Patient will improve his FOTO to <40% limitation in order to progress towards his PLOF with ambulation, leisure activities, and household chores.    Baseline Not formally assessed this visit   Time 7   Period Weeks   Status On-going   PT LONG TERM GOAL #4   Title Patient will be able to lift 15# from low counter with proper squatting technique in order to progress towards work related activities.    Baseline Not formally assessed this visit   Time 7   Period Weeks   Status On-going   PT LONG TERM GOAL #5   Title Patient will improve lumbar spine AROM to [facility] in all directions within post-op restrictions.   Time 7   Period Weeks   Status On-going               Plan - 09/30/15 1205    Clinical Impression Statement Patient having difficulty with core activation and activities such as raising legs, ambulation and transfers. Spent  extensive time on education and technique. Pt admits to not being consistent with HEP, encouraged pt to work on HEP and continue ambulation. Discussed seated core activation to enable pt to perform more consistenly during the day. Pt remains appropriate for continued PT sessions.    PT Next Visit Plan cont with abdominal activation with functional movement, reinforce HEP/ambulation at home. Trial e-stim for muscle guarding.  PT Home Exercise Plan encourage HEP, started seated abdominal sets this session.    Consulted and Agree with Plan of Care Patient        Problem List There are no active problems to display for this patient.   Christiane HaBenjamin J. Preciosa Bundrick, PT, CSCS Pager [phone] Office [phone]  09/30/2015, 12:10 PM  [place] [facility] [address] [place], [place], [address] Phone: [phone]   Fax:  [phone]  Name: Nathan Lambert MRN: [id number] Date of Birth: Aug 31, 1980

## 2015-10-02 ENCOUNTER — Encounter (HOSPITAL_COMMUNITY): Payer: BLUE CROSS/BLUE SHIELD | Admitting: Physical Therapy

## 2015-10-07 ENCOUNTER — Ambulatory Visit (HOSPITAL_COMMUNITY): Payer: BLUE CROSS/BLUE SHIELD

## 2015-10-07 DIAGNOSIS — Z9889 Other specified postprocedural states: Secondary | ICD-10-CM

## 2015-10-07 DIAGNOSIS — M6281 Muscle weakness (generalized): Secondary | ICD-10-CM

## 2015-10-07 DIAGNOSIS — M5126 Other intervertebral disc displacement, lumbar region: Secondary | ICD-10-CM

## 2015-10-07 DIAGNOSIS — M545 Low back pain: Secondary | ICD-10-CM

## 2015-10-07 DIAGNOSIS — G8929 Other chronic pain: Secondary | ICD-10-CM

## 2015-10-07 DIAGNOSIS — M2560 Stiffness of unspecified joint, not elsewhere classified: Secondary | ICD-10-CM

## 2015-10-07 DIAGNOSIS — R269 Unspecified abnormalities of gait and mobility: Secondary | ICD-10-CM

## 2015-10-07 NOTE — Therapy (Signed)
[facility] [address] [place], [place], [address] Phone: [phone]   Fax:  [phone]  Physical Therapy Treatment  Patient Details  Name: Nathan Lambert MRN: [id number] Date of Birth: 11-06-1980 Referring Provider: Dr. Donalee CitrinGary Cram   Encounter Date: 10/07/2015      PT End of Session - 10/07/15 1114    Visit Number 6   Number of Visits 13   Date for PT Re-Evaluation 10/23/15   Authorization Type BCBS    Authorization Time Period 09/23/2015 to 11/11/2015   PT Start Time 1103   PT Stop Time 1150   PT Time Calculation (min) 47 min   Activity Tolerance Patient tolerated treatment well   [facility] tasks assessed/performed      Past Medical History  Diagnosis Date  . Allergy 2012    dogs/cats and bactirm    Past Surgical History  Procedure Laterality Date  . Fracture surgery Left     ORIF   . Spine surgery Right 06/16/2015    p lumbar laminectomy and microdisectomy at L5-S1 on the right    There were no vitals filed for this visit.  Visit Diagnosis:  S/P lumbar laminectomy  Disc displacement, lumbar  Chronic right-sided low back pain without sciatica  Muscle weakness  Joint stiffness of spine  Abnormality of gait      Subjective Assessment - 10/07/15 1107    Subjective Pt reported "my back is feeling better" and that he is able to walk further distances with less severe LBP. He reports good compliance with his HEP and ambulating on a more frequent basis as requested by the therapist. R-sided LBP was rated 4/10 on a VAS upon arrival.    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 a few hours   How long can you stand comfortably? 45 minutes    How long can you walk comfortably? 20 minutes    Diagnostic tests MRI= herniated NP at L5-S1   Patient Stated Goals Pt's goal is to reduce his LBP, return to sports related  activities, return to work, and to be able to play with his son.    Currently in Pain? Yes   Pain Score 4   LBP ranges 1-7/10 on a VAS    Pain Location Back   Pain Orientation Right;Lower;Medial   Pain Descriptors / Indicators Aching   Pain Type Surgical pain   Pain Radiating Towards No radicular pain    Pain Onset More than a month ago   Pain Frequency Intermittent   Aggravating Factors  long distance ambulation and staying in one position for too long    Pain Relieving Factors prescribed pain meds and sitting   Effect of Pain on Daily Activities limited with household chores and long distance ambulation    Multiple Pain Sites No                         [facility] Adult PT Treatment/Exercise - 10/07/15 0001    Transfers   Transfers Sit to Stand;Stand to Sit;Supine to Sit;Sit to Supine   Sit to Stand 7: Independent   Stand to Sit 7: Independent   Supine to Sit 5: Supervision   Supine to Sit Details Other (comment)  cues for log rolling   Sit to Supine 5: Supervision   Ambulation/Gait   Gait Pattern --  Right  Lumbar Exercises: Stretches   Passive Hamstring Stretch 3 reps;30 seconds   Passive Hamstring Stretch Limitations bilateral in supine position    Single Knee to Chest Stretch 3 reps;30 seconds;Other (comment)  bilateral    Lower Trunk Rotation Other (comment)  1 set of 15 reps in supine   Piriformis Stretch 3 reps;30 seconds;Other (comment)  bilateral    Lumbar Exercises: Aerobic   Stationary Bike 7 minutes on level 3  at beginning of PT tx    Lumbar Exercises: Standing   Functional Squats 15 reps;Other (comment)  1 set with use of physioball in corner of room   Other Standing Lumbar Exercises Standing rows with red thera-band x 2 sets of 10 reps    Lumbar Exercises: Supine   Ab Set 5 seconds;15 reps;Other (comment)  with B shoulder flexion to 90 degrees               Other Supine Lumbar Exercises Seated core bracing with shd flexion to 90 degrees  with no additional resistance, x 2 sets of 10 reps    Lumbar Exercises: Sidelying   Clam Other (comment);20 reps  bilateral    Clam Limitations red thera-band    Manual Therapy    Passive ROM B hip PROM into flex/IR/ER x 15 reps each                 PT Education - 10/07/15 1113    Education provided Yes   Education Details Educated pt on current HEP, core bracing, and walking program    Person(s) Educated Patient   Methods Explanation;Demonstration;Tactile cues   Comprehension Verbalized understanding;Returned demonstration          PT Short Term Goals - 09/23/15 1009    PT SHORT TERM GOAL #1   Title Patient will independently demo full understanding with initial HEP in order to progress towards improved function.    Baseline Pt required vebal cues for proper technique and parameters   Time 2   Period Weeks   Status On-going   PT SHORT TERM GOAL #2   Title Patient will report decreased R-sided LBP  to 0-5/10 on a VAS in order to improve his ability to sleep with less than 2 interruptions/ night.   Baseline 2-3 interruptions/ night.   Time 3   Period Weeks   Status On-going   PT SHORT TERM GOAL #3   Title Patient will safely demo proper body mechanics with squats and log rolling in order to reduce stress on the lumbar spine with ADLs and household activities.    Baseline Fair technique demo with log rolling with verbal cues to keep core braced    Time 2   Period Weeks   Status On-going           PT Long Term Goals - 09/23/15 1010    PT LONG TERM GOAL #1   Title Patient will report decreased back pain to 0-3/10 on a VAS in order to improve outdoor ambulation to >45 minutes.    Baseline LBP ranges between a 2-10/10 on a VAS   Time 7   Period Weeks   Status On-going   PT LONG TERM GOAL #2   Title Patient will demo improved LE and core strength to 4+/5 MMT grade in order to improve tolerance with leisure activities.   Time 7   Period Weeks   Status On-going    PT LONG TERM GOAL #3   Title Patient will improve his FOTO to <  40% limitation in order to progress towards his PLOF with ambulation, leisure activities, and household chores.    Baseline Not formally assessed this visit   Time 7   Period Weeks   Status On-going   PT LONG TERM GOAL #4   Title Patient will be able to lift 15# from low counter with proper squatting technique in order to progress towards work related activities.    Baseline Not formally assessed this visit   Time 7   Period Weeks   Status On-going   PT LONG TERM GOAL #5   Title Patient will improve lumbar spine AROM to [facility] in all directions within post-op restrictions.   Time 7   Period Weeks   Status On-going               Plan - 10/07/15 1115    Clinical Impression Statement PT tx was focused on core stabilization/postural strengthening ther ex, LE stretches, and hip strengthening. Pt ambulated into the clinic with less postural guarding with improved reciprocal arm swing. Reviewed and completed core stabilization ther ex with verbal and tactile cues required for proper technique. Progressed core stabilization in seated position with shoulder flexion. Pt demo improved technique and core activation compared to previous PT visits. Added postural strengthening ther ex with addition of seated rows with red thera-band. Good tolerance reported and assessed with added reps and progressed ther ex. Pt is making slow, but steady progress towards stated PT goals with less severe LBP, less postural guarding, and improved tolerance with ambulation. LBP was rated a 3-4/10 on a VAS at the completion of PT tx. Pt would benefit from continued skilled PT to address strength, flexibility, mobility, and pain deficits. Continue with current POC.    Pt will benefit from skilled therapeutic intervention in order to improve on the following deficits Abnormal gait;Decreased endurance;Hypomobility;Decreased scar mobility;Decreased  strength;Pain;Decreased activity tolerance;Difficulty walking;Improper body mechanics;Decreased range of motion;Impaired flexibility;Postural dysfunction;Decreased mobility   Clinical Impairments Affecting Rehab Potential hx of lumbar disc dicplacement due to work related injury   PT Frequency 2x / week   PT Duration Other (comment)  7 weeks    PT Treatment/Interventions Electrical Stimulation;Moist Heat;Gait training;Functional mobility training;Therapeutic activities;Therapeutic exercise;Manual techniques;Patient/family education;Scar mobilization;Passive range of motion;Taping   PT Next Visit Plan cont with abdominal activation with functional movement, reinforce HEP/ambulation at home, and complete STM to lumbar spine paraspinals to reduce muscle guarding. Trial e-stim for muscle guarding.    PT Home Exercise Plan encourage HEP, started seated abdominal sets this session.    Recommended Other Services None at this time    Consulted and Agree with Plan of Care Patient        Problem List There are no active problems to display for this patient.   Erma Pinto, PT, DPT  10/07/2015, 11:57 AM  [place] [facility] [address] [place], [place], [address] Phone: [phone]   Fax:  [phone]  Name: Nathan Lambert MRN: [id number] Date of Birth: [birth year]

## 2015-10-09 ENCOUNTER — Ambulatory Visit (HOSPITAL_COMMUNITY): Payer: BLUE CROSS/BLUE SHIELD

## 2015-10-09 DIAGNOSIS — M5126 Other intervertebral disc displacement, lumbar region: Secondary | ICD-10-CM

## 2015-10-09 DIAGNOSIS — M6281 Muscle weakness (generalized): Secondary | ICD-10-CM

## 2015-10-09 DIAGNOSIS — M545 Low back pain, unspecified: Secondary | ICD-10-CM

## 2015-10-09 DIAGNOSIS — Z9889 Other specified postprocedural states: Secondary | ICD-10-CM

## 2015-10-09 DIAGNOSIS — G8929 Other chronic pain: Secondary | ICD-10-CM

## 2015-10-09 DIAGNOSIS — M2560 Stiffness of unspecified joint, not elsewhere classified: Secondary | ICD-10-CM

## 2015-10-09 DIAGNOSIS — R269 Unspecified abnormalities of gait and mobility: Secondary | ICD-10-CM

## 2015-10-09 NOTE — Therapy (Signed)
[facility] [address] [place], [place], [address] Phone: [phone]   Fax:  [phone]  Physical Therapy Treatment  Patient Details  Name: Nathan Lambert. MRN: [id number] Date of Birth: 1981-02-07 Referring Provider: Dr. Donalee Citrin   Encounter Date: 10/09/2015      PT End of Session - 10/09/15 1130    Visit Number 7   Number of Visits 13   Date for PT Re-Evaluation 10/23/15   Authorization Type BCBS    Authorization Time Period 09/23/2015 to 11/11/2015   PT Start Time 1108   PT Stop Time 1153   PT Time Calculation (min) 45 min   Activity Tolerance Patient tolerated treatment well   Behavior During Therapy [facility] for tasks assessed/performed      Past Medical History  Diagnosis Date  . Allergy 2012    dogs/cats and bactirm    Past Surgical History  Procedure Laterality Date  . Fracture surgery Left     ORIF   . Spine surgery Right 06/16/2015    p lumbar laminectomy and microdisectomy at L5-S1 on the right    There were no vitals filed for this visit.  Visit Diagnosis:  S/P lumbar laminectomy  Disc displacement, lumbar  Chronic right-sided low back pain without sciatica  Muscle weakness  Joint stiffness of spine  Abnormality of gait      Subjective Assessment - 10/09/15 1115    Subjective Pt reported " my back is doing pretty good" with LBP rated a 4/10 on a VAS. Pt further stated " I felt great after my last visit". Pt reported good compliance with his HEP and daily walking. He further reported that he believes the walking is most beneficial.    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    Currently in Pain? Yes   Pain Score 4   LBP has ranged between a 2-6/10 on a VAS  since last PT visit    Pain Location Back   Pain Orientation Right;Lower;Medial   Pain Descriptors / Indicators Aching   Pain Type Surgical pain;Chronic pain   Pain Onset More than a month ago   Pain Frequency Intermittent   Aggravating Factors  long distance ambulation and prolonged standing    Pain Relieving Factors prescribed pain meds and sitting   Effect of Pain on Daily Activities limited with household chores and long distance ambulation                          [facility] Adult PT Treatment/Exercise - 10/09/15 0001    Lumbar Exercises: Stretches   Passive Hamstring Stretch 3 reps;30 seconds   Passive Hamstring Stretch Limitations bilateral in supine position    Single Knee to Chest Stretch 3 reps;30 seconds;Other (comment)   Lower Trunk Rotation Other (comment)  1 set of 15 reps in supine   Piriformis Stretch 3 reps;30 seconds;Other (comment)   Lumbar Exercises: Aerobic   Stationary Bike 7 minutes on level 3   Lumbar Exercises: Standing   Functional Squats 15 reps;Other (comment)  2 sets with use of physioball on wall   Other Standing Lumbar Exercises Standing rows with red thera-band x 2 sets of 10 reps  Lumbar Exercises: Seated   Hip Flexion on Ball Strengthening;Both;Other (comment)   Hip Flexion on Ball Limitations alternating shd and hip flexion x 10 reps   Sit to Stand 5 reps   Sit to Stand Limitations focus on core activation.   Lumbar Exercises: Supine   Other Supine Lumbar Exercises Seated core bracing with shd flexion to 90 degrees, x 2 sets of 10 reps   2nd set completed with 2# DB in each hand    Manual Therapy   Passive ROM B hip PROM into flex/IR/ER x 15 reps each              PT Education - 10/09/15 1129    Education provided Yes   Education Details HEP, core bracing, and walking program    Person(s) Educated Patient   Methods Explanation;Demonstration;Verbal cues   Comprehension Verbalized understanding;Returned demonstration           PT Short Term Goals - 09/23/15 1009    PT SHORT TERM GOAL #1   Title Patient will independently demo full understanding with initial HEP in order to progress towards improved function.    Baseline Pt required vebal cues for proper technique and parameters   Time 2   Period Weeks   Status On-going   PT SHORT TERM GOAL #2   Title Patient will report decreased R-sided LBP  to 0-5/10 on a VAS in order to improve his ability to sleep with less than 2 interruptions/ night.   Baseline 2-3 interruptions/ night.   Time 3   Period Weeks   Status On-going   PT SHORT TERM GOAL #3   Title Patient will safely demo proper body mechanics with squats and log rolling in order to reduce stress on the lumbar spine with ADLs and household activities.    Baseline Fair technique demo with log rolling with verbal cues to keep core braced    Time 2   Period Weeks   Status On-going           PT Long Term Goals - 09/23/15 1010    PT LONG TERM GOAL #1   Title Patient will report decreased back pain to 0-3/10 on a VAS in order to improve outdoor ambulation to >45 minutes.    Baseline LBP ranges between a 2-10/10 on a VAS   Time 7   Period Weeks   Status On-going   PT LONG TERM GOAL #2   Title Patient will demo improved LE and core strength to 4+/5 MMT grade in order to improve tolerance with leisure activities.   Time 7   Period Weeks   Status On-going   PT LONG TERM GOAL #3   Title Patient will improve his FOTO to <40% limitation in order to progress towards his PLOF with ambulation, leisure activities, and household chores.    Baseline Not formally assessed this visit   Time 7   Period Weeks   Status On-going   PT LONG TERM GOAL #4   Title Patient will be able to lift 15# from low counter with proper squatting technique in order to progress towards work related activities.    Baseline Not formally assessed this visit   Time 7   Period Weeks   Status On-going   PT LONG TERM GOAL #5    Title Patient will improve lumbar spine AROM to [facility] in all directions within post-op restrictions.   Time 7   Period Weeks   Status On-going  Plan - 10/09/15 1131    Clinical Impression Statement Progressed core stabilization ther ex with addition of 4# to core bracing in seated position with shoulder flexion to 90 degrees and seated core bracing with alternating hip/shd flexion. Improved core activation and technique demo with core stabilization ther ex this visit with less cueing required. Pt is able to tolerate progressed ther ex without exacerbating symptoms. Pt is responding well to PT tx and is making steady progress towards stated goals with improved activity tolerance. Discussed importance of completing HEP as prescribed. Pt admitted inconsistency with performance of his stretches, but he is completing his ab sets and walking program as prescribed. LBP remained at baseline levels at the end of PT tx. Continue with current POC and progress with core stabilization ther ex as tolerated and within post-op restrictions.   Pt will benefit from skilled therapeutic intervention in order to improve on the following deficits Abnormal gait;Decreased endurance;Hypomobility;Decreased scar mobility;Decreased strength;Pain;Decreased activity tolerance;Difficulty walking;Improper body mechanics;Decreased range of motion;Impaired flexibility;Postural dysfunction;Decreased mobility   Rehab Potential Good   Clinical Impairments Affecting Rehab Potential hx of lumbar disc dicplacement due to work related injury   PT Frequency 2x / week   PT Duration Other (comment)  7 weeks   PT Treatment/Interventions Electrical Stimulation;Moist Heat;Gait training;Functional mobility training;Therapeutic activities;Therapeutic exercise;Manual techniques;Patient/family education;Scar mobilization;Passive range of motion;Taping   PT Next Visit Plan cont with abdominal activation with functional movement,  reinforce HEP/ambulation at home, and complete STM to lumbar spine paraspinals to reduce muscle guarding. Trial e-stim for muscle guarding if applicable   PT Home Exercise Plan Reviewed HEP with no changes made this visit; Current HEP includes= HS stretch, piriformis stretch, ab set in supine/sitting, SKTC, and mini squats with UE support,    Consulted and Agree with Plan of Care Patient        Problem List There are no active problems to display for this patient.   Bonnee QuinGabe Socrates Cahoon, PT, DPT  10/09/2015, 11:58 AM  [facility] [address] [place], [place], [address] Phone: [phone]   Fax:  [phone]  Name: Nathan FlockCarlton L Korver Jr. MRN: [id number] Date of Birth: August 04, 1980

## 2015-10-14 ENCOUNTER — Ambulatory Visit (HOSPITAL_COMMUNITY): Payer: BLUE CROSS/BLUE SHIELD

## 2015-10-14 DIAGNOSIS — M2560 Stiffness of unspecified joint, not elsewhere classified: Secondary | ICD-10-CM

## 2015-10-14 DIAGNOSIS — M5126 Other intervertebral disc displacement, lumbar region: Secondary | ICD-10-CM

## 2015-10-14 DIAGNOSIS — Z9889 Other specified postprocedural states: Secondary | ICD-10-CM

## 2015-10-14 DIAGNOSIS — M6281 Muscle weakness (generalized): Secondary | ICD-10-CM

## 2015-10-14 DIAGNOSIS — M545 Low back pain, unspecified: Secondary | ICD-10-CM

## 2015-10-14 DIAGNOSIS — R269 Unspecified abnormalities of gait and mobility: Secondary | ICD-10-CM

## 2015-10-14 DIAGNOSIS — G8929 Other chronic pain: Secondary | ICD-10-CM

## 2015-10-14 NOTE — Therapy (Signed)
[facility] [address] [place], [place], [address] Phone: [phone]   Fax:  [phone]  Physical Therapy Treatment  Patient Details  Name: Artis FlockCarlton L Madrid Jr. MRN: [id number] Date of Birth: 07-12-1981 Referring Provider: Dr. Donalee CitrinGary Cram   Encounter Date: 10/14/2015      PT End of Session - 10/14/15 1116    Visit Number 8   Number of Visits 13   Date for PT Re-Evaluation 10/23/15   Authorization Type BCBS    Authorization Time Period 09/23/2015 to 11/11/2015   PT Start Time 1101   PT Stop Time 1154   PT Time Calculation (min) 53 min   Activity Tolerance Patient tolerated treatment well   Behavior [facility] assessed/performed      Past Medical History  Diagnosis Date  . Allergy 2012    dogs/cats and bactirm    Past Surgical History  Procedure Laterality Date  . Fracture surgery Left     ORIF   . Spine surgery Right 06/16/2015    p lumbar laminectomy and microdisectomy at L5-S1 on the right    There were no vitals filed for this visit.  Visit Diagnosis:  S/P lumbar laminectomy  Disc displacement, lumbar  Chronic right-sided low back pain without sciatica  Muscle weakness  Joint stiffness of spine  Abnormality of gait      Subjective Assessment - 10/14/15 1108    Subjective Pt reported increased LBP to a 8/10 on a VAS after mowing his lawn with the use of his riding lawnmower yesterday afternoon. Pt noted that his symptoms subsided later yesterday evening. LBP was rated a 4/10 on a VAS upon arrival. Despite his recent exacerbation, pt noted that his LBP has been less severe since last week.   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    Currently in Pain? Yes   Pain Score 4   LBP has ranged  between a 2-8/10 on a VAS since last PT visit.     Pain Location Back   Pain Orientation Right;Lower;Medial   Pain Descriptors / Indicators Aching   Pain Type Surgical pain;Chronic pain   Pain Onset More than a month ago   Pain Frequency Intermittent   Aggravating Factors  long distance ambulation, prolonged standing   Pain Relieving Factors prescribed pain meds and sitting    Effect of Pain on Daily Activities limited with yardwork    Multiple Pain Sites No                         [facility] Adult PT Treatment/Exercise - 10/14/15 0001    Lumbar Exercises: Stretches   Active Hamstring Stretch 3 reps;30 seconds   Active Hamstring Stretch Limitations standing  with heel on second step with B UE support    Single Knee to Chest Stretch 3 reps;30 seconds;Other (comment)   Lower Trunk Rotation Other (comment)  1 set of 15 reps in supine   Piriformis Stretch 3 reps;30 seconds   Piriformis Stretch Limitations seated positon for left LE; supine position for right  LE    Lumbar Exercises: Aerobic   Stationary Bike 7 minutes on level 3   Lumbar Exercises: Standing   Functional Squats 15  reps;Other (comment)  1 set with UE support on parallel bars   Other Standing Lumbar Exercises Standing rows with red thera-band x 2 sets of 10 reps    Other Standing Lumbar Exercises Standing shoulder extension red thera-band x 1 set of 10 reps    Lumbar Exercises: Seated   Hip Flexion on Ball Strengthening;Both;Other (comment)   Hip Flexion on Ball Limitations alternating shd and hip flexion x 10 reps  seated on standard chair   Sit to Stand 5 reps   Sit to Stand Limitations focus on core activation.   Lumbar Exercises: Seated   Other Supine Lumbar Exercises Seated core bracing with shd flexion to 90 degrees, x 2 sets of 10 reps   2nd set completed with 2# DB in each hand    Manual Therapy   Manual Therapy Soft tissue mobilization   Manual therapy comments Completed independently from other  interventions    Soft tissue mobilization STM toR-sided paraspinal/quadratus lumborum muscles with palmar/finger spreading technique    Passive ROM B hip PROM into flex/IR/ER x 15 reps each                 PT Education - 10/14/15 1115    Education provided Yes   Education Details HEP, core bracing with functional mobility activities, and to avoid yardwork at this time    Person(s) Educated Patient   Methods Explanation;Demonstration;Verbal cues   Comprehension Verbalized understanding;Returned demonstration          PT Short Term Goals - 09/23/15 1009    PT SHORT TERM GOAL #1   Title Patient will independently demo full understanding with initial HEP in order to progress towards improved function.    Baseline Pt required vebal cues for proper technique and parameters   Time 2   Period Weeks   Status On-going   PT SHORT TERM GOAL #2   Title Patient will report decreased R-sided LBP  to 0-5/10 on a VAS in order to improve his ability to sleep with less than 2 interruptions/ night.   Baseline 2-3 interruptions/ night.   Time 3   Period Weeks   Status On-going   PT SHORT TERM GOAL #3   Title Patient will safely demo proper body mechanics with squats and log rolling in order to reduce stress on the lumbar spine with ADLs and household activities.    Baseline Fair technique demo with log rolling with verbal cues to keep core braced    Time 2   Period Weeks   Status On-going           PT Long Term Goals - 09/23/15 1010    PT LONG TERM GOAL #1   Title Patient will report decreased back pain to 0-3/10 on a VAS in order to improve outdoor ambulation to >45 minutes.    Baseline LBP ranges between a 2-10/10 on a VAS   Time 7   Period Weeks   Status On-going   PT LONG TERM GOAL #2   Title Patient will demo improved LE and core strength to 4+/5 MMT grade in order to improve tolerance with leisure activities.   Time 7   Period Weeks   Status On-going   PT LONG TERM GOAL  #3   Title Patient will improve his FOTO to <40% limitation in order to progress towards his PLOF with ambulation, leisure activities, and household chores.    Baseline Not formally assessed this visit   Time 7   Period Weeks  Status On-going   PT LONG TERM GOAL #4   Title Patient will be able to lift 15# from low counter with proper squatting technique in order to progress towards work related activities.    Baseline Not formally assessed this visit   Time 7   Period Weeks   Status On-going   PT LONG TERM GOAL #5   Title Patient will improve lumbar spine AROM to [facility] in all directions within post-op restrictions.   Time 7   Period Weeks   Status On-going               Plan - 10/14/15 1118    Clinical Impression Statement Reviewed and completed progressed core stabilization ther ex. Improved core activation and postural awareness demo by pt with less cues required compared to previous PT visits. Added resisted shoulder extension in standing with red thera-band in order to improve postural strength. Reviewed and progressed mini squats with UE support on parallel bars with instructions provided on proper squatting technique. Fair technique demo with verbal cues required to maintain lumbar spine in neutral position. Good tolerance reported with added postural strengthening. Pt c/o minor paraspinal/core fatigue at the end of PT tx. LBP was rated a 3-4/10 on a VAS at the completion of PT tx. Discussed importance of completing HEP as prescribed and to avoid strenuous activities in order to avoid re-injury or exacerbation of symptoms. Pt verbalized full understanding and agreement. Continue with current POC and progress with core stabilization ther ex as tolerated and within post-op restrictions.    Pt will benefit from skilled therapeutic intervention in order to improve on the following deficits Abnormal gait;Decreased endurance;Hypomobility;Decreased scar mobility;Decreased  strength;Pain;Decreased activity tolerance;Difficulty walking;Improper body mechanics;Decreased range of motion;Impaired flexibility;Postural dysfunction;Decreased mobility   Rehab Potential Good   Clinical Impairments Affecting Rehab Potential hx of lumbar disc dicplacement due to work related injury   PT Frequency 2x / week   PT Duration Other (comment)  7 weeks    PT Treatment/Interventions Electrical Stimulation;Moist Heat;Gait training;Functional mobility training;Therapeutic activities;Therapeutic exercise;Manual techniques;Patient/family education;Scar mobilization;Passive range of motion;Taping   PT Next Visit Plan cont with abdominal activation with functional movement, reinforce HEP/ambulation at home, and complete STM to lumbar spine paraspinals to reduce muscle guarding. Complete IFC e-stim to low back in S/L position to reduce pain and muscle guarding at next PT visit.    PT Home Exercise Plan Reviewed HEP with no changes made this visit; Current HEP includes= HS stretch, piriformis stretch, ab set in supine/sitting, SKTC, and mini squats with UE support; Add resisted rows and shd extenstion with red thera-band to HEP at next PT visit   Consulted and Agree with Plan of Care Patient        Problem List There are no active problems to display for this patient.   Bonnee Quin, PT, DPT   10/14/2015, 12:00 PM  [facility] [address] [place], [place], [address] Phone: [phone]   Fax:  [phone]  Name: Ivar Domangue. MRN: [id number] Date of Birth: 1980-07-23

## 2015-10-16 ENCOUNTER — Encounter (HOSPITAL_COMMUNITY): Payer: Self-pay

## 2015-10-16 ENCOUNTER — Ambulatory Visit (HOSPITAL_COMMUNITY): Payer: BLUE CROSS/BLUE SHIELD

## 2015-10-16 DIAGNOSIS — R269 Unspecified abnormalities of gait and mobility: Secondary | ICD-10-CM

## 2015-10-16 DIAGNOSIS — Z9889 Other specified postprocedural states: Secondary | ICD-10-CM | POA: Diagnosis not present

## 2015-10-16 DIAGNOSIS — M2560 Stiffness of unspecified joint, not elsewhere classified: Secondary | ICD-10-CM

## 2015-10-16 DIAGNOSIS — M545 Low back pain: Secondary | ICD-10-CM

## 2015-10-16 DIAGNOSIS — M5126 Other intervertebral disc displacement, lumbar region: Secondary | ICD-10-CM

## 2015-10-16 DIAGNOSIS — M6281 Muscle weakness (generalized): Secondary | ICD-10-CM

## 2015-10-16 DIAGNOSIS — G8929 Other chronic pain: Secondary | ICD-10-CM

## 2015-10-16 NOTE — Therapy (Signed)
[facility] [address] [place], [place], [address] Phone: [phone]   Fax:  [phone]  Physical Therapy Treatment  Patient Details  Name: Nathan Lambert. MRN: [id number] Date of Birth: 07/02/1981 Referring Provider: Dr. Donalee Citrin   Encounter Date: 10/16/2015      PT End of Session - 10/16/15 1117    Visit Number 9   Number of Visits 13   Date for PT Re-Evaluation 10/23/15   Authorization Type BCBS    Authorization Time Period 09/23/2015 to 11/11/2015   PT Start Time 1105   PT Stop Time 1200   PT Time Calculation (min) 55 min   Activity Tolerance Patient tolerated treatment well   Behavior During Therapy [facility] for tasks assessed/performed      Past Medical History  Diagnosis Date  . Allergy 2012    dogs/cats and bactirm    Past Surgical History  Procedure Laterality Date  . Fracture surgery Left     ORIF   . Spine surgery Right 06/16/2015    p lumbar laminectomy and microdisectomy at L5-S1 on the right    There were no vitals filed for this visit.  Visit Diagnosis:  S/P lumbar laminectomy  Disc displacement, lumbar  Chronic right-sided low back pain without sciatica  Muscle weakness  Joint stiffness of spine  Abnormality of gait      Subjective Assessment - 10/16/15 1112    Subjective Pt reported LBP that was rated a 4/10 on a VAS. Pt noted minor residual soreness after his last PT visit, which subsided later that day. No new complaints reported. Pt reported improved compliance with his HEP with no issues noted.    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 20 minutes    Diagnostic tests MRI= herniated NP at L5-S1   Patient Stated Goals Pt's goal is to reduce his LBP, return to sports related activities, return to work, and to be able to play with his son.    Currently in Pain? Yes   Pain Score 4   R-sided LBP has ranged  between a 2-7/10 on a VAS since last PT visit    Pain Location Back   Pain Orientation Right;Lower   Pain Descriptors / Indicators Aching   Pain Type Surgical pain;Chronic pain   Pain Radiating Towards No radicular pain    Pain Onset More than a month ago   Pain Frequency Intermittent   Aggravating Factors  long distance ambulation and prolonged standing    Pain Relieving Factors prescribed pain meds and sitting    Effect of Pain on Daily Activities limited with yard work activities                          [facility] Adult PT Treatment/Exercise - 10/16/15 0001    Lumbar Exercises: Stretches   Active Hamstring Stretch 3 reps;30 seconds   Active Hamstring Stretch Limitations standing  with heel on second step with B UE support    Piriformis Stretch 3 reps;30 seconds   Piriformis Stretch Limitations seated positon for left LE; supine position for right  LE    Lumbar Exercises: Aerobic   Stationary Bike 7 minutes on level 3-4   Lumbar Exercises: Standing   Functional Squats Other (comment);10 reps  2 set with use of physioball on wall   Other Standing Lumbar Exercises Standing rows with  red thera-band x 2 sets of 10 reps   Added to HEP    Other Standing Lumbar Exercises Standing shoulder extension red thera-band x 1 set of 10 reps   Added to HEP    Lumbar Exercises: Supine   Other Supine Lumbar Exercises Seated core bracing with shd flexion to 90 degrees, x 2 sets of 10 reps   2nd set completed with 2# DB in each hand    Modalities   Modalities Electrical Stimulation   Electrical Stimulation   Electrical Stimulation Location Lumbar spine; 4 pads ( 2 on right and 2 on left);    Electrical Stimulation Action IFC e-stim for pain reduction   Electrical Stimulation Parameters Pre-set parameters for IFC e-stim on intensity 4-5; x 10 minutes   Electrical Stimulation Goals Pain;Other (comment)  skin was WNL prior to and after tx    Manual Therapy   Manual Therapy Soft tissue  mobilization   Manual therapy comments Completed independently from other interventions    Soft tissue mobilization STM toR-sided paraspinal/quadratus lumborum muscles with palmar/finger spreading technique                 PT Education - 10/16/15 1116    Education provided Yes   Education Details HEP and core bracing with functional mobility activities    Person(s) Educated Patient   Methods Explanation;Demonstration   Comprehension Verbalized understanding;Returned demonstration          PT Short Term Goals - 09/23/15 1009    PT SHORT TERM GOAL #1   Title Patient will independently demo full understanding with initial HEP in order to progress towards improved function.    Baseline Pt required vebal cues for proper technique and parameters   Time 2   Period Weeks   Status On-going   PT SHORT TERM GOAL #2   Title Patient will report decreased R-sided LBP  to 0-5/10 on a VAS in order to improve his ability to sleep with less than 2 interruptions/ night.   Baseline 2-3 interruptions/ night.   Time 3   Period Weeks   Status On-going   PT SHORT TERM GOAL #3   Title Patient will safely demo proper body mechanics with squats and log rolling in order to reduce stress on the lumbar spine with ADLs and household activities.    Baseline Fair technique demo with log rolling with verbal cues to keep core braced    Time 2   Period Weeks   Status On-going           PT Long Term Goals - 09/23/15 1010    PT LONG TERM GOAL #1   Title Patient will report decreased back pain to 0-3/10 on a VAS in order to improve outdoor ambulation to >45 minutes.    Baseline LBP ranges between a 2-10/10 on a VAS   Time 7   Period Weeks   Status On-going   PT LONG TERM GOAL #2   Title Patient will demo improved LE and core strength to 4+/5 MMT grade in order to improve tolerance with leisure activities.   Time 7   Period Weeks   Status On-going   PT LONG TERM GOAL #3   Title Patient will  improve his FOTO to <40% limitation in order to progress towards his PLOF with ambulation, leisure activities, and household chores.    Baseline Not formally assessed this visit   Time 7   Period Weeks   Status On-going   PT LONG  TERM GOAL #4   Title Patient will be able to lift 15# from low counter with proper squatting technique in order to progress towards work related activities.    Baseline Not formally assessed this visit   Time 7   Period Weeks   Status On-going   PT LONG TERM GOAL #5   Title Patient will improve lumbar spine AROM to [facility] in all directions within post-op restrictions.   Time 7   Period Weeks   Status On-going               Plan - 10/16/15 1118    Clinical Impression Statement Pt progressed well through ther regimen with good tolerance reported. Updated HEP with addition of resisted rows and shd extension with red thera-band. Good understanding demo with updated HEP. No changes made to ther ex regimen this visit. Palpable tenderness assessed over R paraspinals of the lumbar spine. Completed STM to R-sided paraspinals with TPs reduced. Ended PT tx with IFC E-stim to the lumbar spine in order to reduce pain and reduce muscle guarding. Good tolerance reported with E-stim tx with LBP rated a 4/10 on a VAS. Pt is making slow, but steady progress towards stated goals with less guarded standing posture/ambulation, reduced LBP, improved core activation, and improved hip strength. The pt would benefit from continued skilled PT to further improve lumbar AROM, B hip/core strength, reduce pain, and improve LE flexibility in order to return to recreational and work related activities.    Pt will benefit from skilled therapeutic intervention in order to improve on the following deficits Abnormal gait;Decreased endurance;Hypomobility;Decreased scar mobility;Decreased strength;Pain;Decreased activity tolerance;Difficulty walking;Improper body mechanics;Decreased range of  motion;Impaired flexibility;Postural dysfunction;Decreased mobility   Rehab Potential Good   Clinical Impairments Affecting Rehab Potential hx of lumbar disc dicplacement due to work related injury   PT Frequency 2x / week   PT Duration Other (comment)  7 weeks    PT Treatment/Interventions Electrical Stimulation;Moist Heat;Gait training;Functional mobility training;Therapeutic activities;Therapeutic exercise;Manual techniques;Patient/family education;Scar mobilization;Passive range of motion;Taping   PT Next Visit Plan cont with abdominal activation with functional movement, reinforce HEP/ambulation at home, and complete STM to lumbar spine paraspinals to reduce muscle guarding. Continue with IFC e-stim to low back in S/L position to reduce pain and muscle guarding. Progress core stabilization ther ex as tolerated.    PT Home Exercise Plan Reviewed HEP with addition of resisted rows and shd extenstion with red thera-band; Current HEP includes= HS stretch, piriformis stretch, ab set in supine/sitting, SKTC, mini squats with UE support, and resisted rows and shd extenstion with red thera-band ;   Consulted and Agree with Plan of Care Patient        Problem List There are no active problems to display for this patient.   Bonnee Quin, PT, DPT  10/16/2015, 12:15 PM  [facility] [address] [place], [place], [address] Phone: [phone]   Fax:  [phone]  Name: Nathan Lambert. MRN: [id number] Date of Birth: 27-Feb-1981

## 2015-10-21 ENCOUNTER — Ambulatory Visit (HOSPITAL_COMMUNITY): Payer: BLUE CROSS/BLUE SHIELD | Attending: Neurosurgery

## 2015-10-21 DIAGNOSIS — G8929 Other chronic pain: Secondary | ICD-10-CM

## 2015-10-21 DIAGNOSIS — R2689 Other abnormalities of gait and mobility: Secondary | ICD-10-CM | POA: Diagnosis present

## 2015-10-21 DIAGNOSIS — M6281 Muscle weakness (generalized): Secondary | ICD-10-CM | POA: Diagnosis present

## 2015-10-21 DIAGNOSIS — R269 Unspecified abnormalities of gait and mobility: Secondary | ICD-10-CM | POA: Diagnosis present

## 2015-10-21 DIAGNOSIS — M2560 Stiffness of unspecified joint, not elsewhere classified: Secondary | ICD-10-CM | POA: Diagnosis present

## 2015-10-21 DIAGNOSIS — M5126 Other intervertebral disc displacement, lumbar region: Secondary | ICD-10-CM

## 2015-10-21 DIAGNOSIS — Z9889 Other specified postprocedural states: Secondary | ICD-10-CM | POA: Diagnosis present

## 2015-10-21 DIAGNOSIS — M545 Low back pain: Secondary | ICD-10-CM | POA: Insufficient documentation

## 2015-10-21 NOTE — Therapy (Signed)
[facility] [address] [place], [place], [address] Phone: [phone]   Fax:  [phone]  Physical Therapy Treatment  Patient Details  Name: Artis FlockCarlton L Gorczyca Jr. MRN: [id number] Date of Birth: 11-15-1980 Referring Provider: Dr. Donalee CitrinGary Cram  Encounter Date: 10/21/2015      PT End of Session - 10/21/15 1526    Visit Number 10   Number of Visits 13   Date for PT Re-Evaluation 10/23/15   Authorization Type BCBS    Authorization Time Period 09/23/2015 to 11/11/2015   PT Start Time 1520   PT Stop Time 1612   PT Time Calculation (min) 52 min   Activity Tolerance Patient tolerated treatment well   Behavior During Therapy [facility] for tasks assessed/performed      Past Medical History  Diagnosis Date  . Allergy 2012    dogs/cats and bactirm    Past Surgical History  Procedure Laterality Date  . Fracture surgery Left     ORIF   . Spine surgery Right 06/16/2015    p lumbar laminectomy and microdisectomy at L5-S1 on the right    There were no vitals filed for this visit.  Visit Diagnosis:  S/P lumbar laminectomy  Disc displacement, lumbar  Chronic right-sided low back pain without sciatica  Muscle weakness  Joint stiffness of spine  Abnormality of gait      Subjective Assessment - 10/21/15 1517    Subjective Pt stated LBP pain scale 3/10, stated increased pain with certain movements.  Reports compliance with HEP daily with no questions concerning.     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    Currently in Pain? Yes   Pain Score 3    Pain Location Back   Pain Orientation Lower;Right;Left;Medial   Pain Descriptors / Indicators Aching;Sore   Pain Type Surgical pain;Chronic pain   Pain Radiating Towards No radicular pain   Pain Onset More than a month ago   Pain  Frequency Intermittent   Aggravating Factors  long distance ambulation and prolonged standing   Pain Relieving Factors prescribed pain meds and sitting   Effect of Pain on Daily Activities limited with yard work activities            [facility] PT Assessment - 10/21/15 0001    Assessment   Medical Diagnosis Lumbago   Referring Provider Dr. Donalee CitrinGary Cram   Onset Date/Surgical Date 06/16/15   Hand Dominance Right   Next MD Visit 11/02/2015   Prior Therapy PT with different clinic prior to his recent surgery    Precautions   Precautions Back             [facility] Adult PT Treatment/Exercise - 10/21/15 0001    Lumbar Exercises: Stretches   Active Hamstring Stretch 3 reps;30 seconds   Active Hamstring Stretch Limitations standing with 12in step   Single Knee to Chest Stretch 3 reps;30 seconds;Other (comment)  passive Bil LE   Piriformis Stretch 3 reps;30 seconds   Piriformis Stretch Limitations manual passive stretches supine position   Lumbar Exercises: Aerobic   Stationary Bike 5 minutes on level 4   Lumbar Exercises: Standing   Functional Squats 10 reps;Other (comment)  2 sets with use of physioball in corner of room   Lumbar Exercises: Seated   Hip Flexion on Ball  Limitations alternating shd and hip flexion x 10 reps 2#   Lumbar Exercises: Supine   Other Supine Lumbar Exercises Seated core bracing with shd flexion to 90 degrees, x 2 sets of 10 reps 2#   Lumbar Exercises: Quadruped   Single Arm Raise 5 reps;3 seconds   Single Arm Raises Limitations cueing for core stability; reports increased pain to standing following exercise   Electrical Stimulation   Electrical Stimulation Goals Other (comment)  pt. declined, requested    Manual Therapy   Manual Therapy Other (comment)   Manual therapy comments Completed independently from other interventions    Passive ROM B hip PROM into flex/IR/ER x 15 reps each    Other Manual Therapy pt declined all modalities and manual; requested  exercises only                  PT Short Term Goals - 09/23/15 1009    PT SHORT TERM GOAL #1   Title Patient will independently demo full understanding with initial HEP in order to progress towards improved function.    Baseline Pt required vebal cues for proper technique and parameters   Time 2   Period Weeks   Status On-going   PT SHORT TERM GOAL #2   Title Patient will report decreased R-sided LBP  to 0-5/10 on a VAS in order to improve his ability to sleep with less than 2 interruptions/ night.   Baseline 2-3 interruptions/ night.   Time 3   Period Weeks   Status On-going   PT SHORT TERM GOAL #3   Title Patient will safely demo proper body mechanics with squats and log rolling in order to reduce stress on the lumbar spine with ADLs and household activities.    Baseline Fair technique demo with log rolling with verbal cues to keep core braced    Time 2   Period Weeks   Status On-going           PT Long Term Goals - 09/23/15 1010    PT LONG TERM GOAL #1   Title Patient will report decreased back pain to 0-3/10 on a VAS in order to improve outdoor ambulation to >45 minutes.    Baseline LBP ranges between a 2-10/10 on a VAS   Time 7   Period Weeks   Status On-going   PT LONG TERM GOAL #2   Title Patient will demo improved LE and core strength to 4+/5 MMT grade in order to improve tolerance with leisure activities.   Time 7   Period Weeks   Status On-going   PT LONG TERM GOAL #3   Title Patient will improve his FOTO to <40% limitation in order to progress towards his PLOF with ambulation, leisure activities, and household chores.    Baseline Not formally assessed this visit   Time 7   Period Weeks   Status On-going   PT LONG TERM GOAL #4   Title Patient will be able to lift 15# from low counter with proper squatting technique in order to progress towards work related activities.    Baseline Not formally assessed this visit   Time 7   Period Weeks   Status  On-going   PT LONG TERM GOAL #5   Title Patient will improve lumbar spine AROM to [facility] in all directions within post-op restrictions.   Time 7   Period Weeks   Status On-going  Plan - 10/21/15 1615    Clinical Impression Statement Pt entered dept ambulating very stiff.  Session focus on improving LE and spinal mobility as well as core stability for pain control.  Pt presentes with high muscle guarding through session requiring increased cueing to complete diaphragmatic breathing techniques and to relax musculature during movement (ex: Relax pper traps during seated UE flexion and abduction)  Added quadruped exercises for core stabilty with reports of increased back pain following exercise due to lumbar flexion.  Good form and reports of compliance wiht HEP with majority of exercises this session, main cueing for breathing to reduce muscle guarding.  End of session offered soft tissue mobilization and estim for pain relief, pt declined stateing he would prefer to focus on exercises for mobilty and strengthening.  No reports of increased pain at end of session, did reports increase to 1/10 following quadruped exercises though resolved at end of session.     Pt will benefit from skilled therapeutic intervention in order to improve on the following deficits Abnormal gait;Decreased endurance;Hypomobility;Decreased scar mobility;Decreased strength;Pain;Decreased activity tolerance;Difficulty walking;Improper body mechanics;Decreased range of motion;Impaired flexibility;Postural dysfunction;Decreased mobility   Rehab Potential Good   Clinical Impairments Affecting Rehab Potential hx of lumbar disc dicplacement due to work related injury   PT Frequency 2x / week   PT Duration Other (comment)  7 weeks   PT Treatment/Interventions Electrical Stimulation;Moist Heat;Gait training;Functional mobility training;Therapeutic activities;Therapeutic exercise;Manual techniques;Patient/family  education;Scar mobilization;Passive range of motion;Taping   PT Next Visit Plan cont with abdominal activation with functional movement, reinforce HEP/ambulation at home, and complete STM to lumbar spine paraspinals to reduce muscle guarding. Continue with IFC e-stim to low back in S/L position to reduce pain and muscle guarding. Progress core stabilization ther ex as tolerated.    PT Home Exercise Plan Reviewed HEP with addition of resisted rows and shd extenstion with red thera-band; Current HEP includes= HS stretch, piriformis stretch, ab set in supine/sitting, SKTC, mini squats with UE support, and resisted rows and shd extenstion with red thera-band ;        Problem List There are no active problems to display for this patient.  [address], LPTA; CBIS [phone]  Juel Burrow 10/21/2015, 5:26 PM  [facility] [address] [place], [place], [address] Phone: [phone]   Fax:  [phone]  Name: Isidro Monks. MRN: [id number] Date of Birth: 1980/08/26

## 2015-10-23 ENCOUNTER — Ambulatory Visit (HOSPITAL_COMMUNITY): Payer: BLUE CROSS/BLUE SHIELD

## 2015-10-23 DIAGNOSIS — M6281 Muscle weakness (generalized): Secondary | ICD-10-CM

## 2015-10-23 DIAGNOSIS — R2689 Other abnormalities of gait and mobility: Secondary | ICD-10-CM

## 2015-10-23 DIAGNOSIS — M545 Low back pain, unspecified: Secondary | ICD-10-CM

## 2015-10-23 DIAGNOSIS — Z9889 Other specified postprocedural states: Secondary | ICD-10-CM | POA: Diagnosis not present

## 2015-10-23 NOTE — Therapy (Signed)
[place], [place], [address] Phone: [phone]   Fax:  [phone]  Physical Therapy Treatment/Reassessment  Patient Details  Name: Nathan Lambert. MRN: [id number] Date of Birth: 09/26/1980 Referring Provider: Dr. Kary Kos   Encounter Date: 10/23/2015      PT End of Session - 10/23/15 0911    Visit Number 11   Number of Visits 23   Date for PT Re-Evaluation 11/11/15   Authorization Type BCBS    Authorization Time Period 10/23/2015 to 12/04/2015   PT Start Time 0849   PT Stop Time 0930   PT Time Calculation (min) 41 min   Activity Tolerance Patient tolerated treatment well   Behavior During Therapy [facility] for tasks assessed/performed      Past Medical History  Diagnosis Date  . Allergy 2012    dogs/cats and bactirm    Past Surgical History  Procedure Laterality Date  . Fracture surgery Left     ORIF   . Spine surgery Right 06/16/2015    p lumbar laminectomy and microdisectomy at L5-S1 on the right    There were no vitals filed for this visit.      Subjective Assessment - 10/23/15 0903    Subjective Pt reported a 50-60% improvement since beginning with PT with less severe LBP and improved tolerance with ADLs and ambulation. Pt reported minor LBP upon arrival that was rated a 4/10 on a VAS. Pt reports that long distance ambulation continues to be limited secondary to pain. Overall, the pt reported that he is happy with his progress made with PT services and believes he is getting better.   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 No limited    How long can you stand comfortably? 40 minutes    How long can you walk comfortably? 20 minutes    Diagnostic tests MRI= herniated NP at L5-S1   Patient Stated Goals Pt's goal is to reduce his LBP, return to sports related activities, return to work, and to be able to play with his son.    Currently in Pain?  Yes   Pain Score 4   R-sided LBP has ranged between a 2-8/10 on a VAS within the last week   Pain Location Back   Pain Orientation Lower;Right   Pain Descriptors / Indicators Aching   Pain Type Surgical pain;Chronic pain   Pain Radiating Towards no radicular pain    Pain Onset More than a month ago   Pain Frequency Intermittent   Aggravating Factors  twisting, long distance ambulation >20 minutes, and lying in prone    Pain Relieving Factors prescribed pain meds and sitting    Effect of Pain on Daily Activities limited with yard work activities             [facility] PT Assessment - 10/23/15 0001    Assessment   Medical Diagnosis Lumbago   Referring Provider Dr. Kary Kos    Onset Date/Surgical Date 06/16/15   Hand Dominance Right   Next MD Visit 10/23/2015   Prior Therapy --   Precautions   Precautions Back   Prior Function   Level of Independence Independent with basic ADLs   Vocation On disability   AROM   AROM Assessment Site Lumbar   Lumbar Flexion NT   Lumbar Extension 10   Lumbar - Right Side Bend 18   Lumbar - Left Side Bend  16   Strength   Right Hip Flexion 4/5   Right Hip Extension 3+/5   Right Hip ABduction 4/5   Left Hip Flexion 5/5   Left Hip Extension 3+/5   Left Hip ABduction 4/5   Right Knee Flexion 5/5   Right Knee Extension 5/5   Left Knee Flexion 5/5   Right Ankle Dorsiflexion 5/5   Right Ankle Plantar Flexion 5/5   Right Ankle Inversion 5/5   Right Ankle Eversion 5/5   Left Ankle Dorsiflexion 5/5   Left Ankle Plantar Flexion 5/5   Left Ankle Inversion 5/5   Left Ankle Eversion 5/5   Flexibility   Hamstrings -10 deg, bilateral    Quadriceps 50% limited   Piriformis 25% limited, bilateral    Bed Mobility   Bed Mobility Rolling Right;Rolling Left;Sit to Supine;Supine to Sit   Rolling Right 6: Modified independent (Device/Increase time)  with good log roll technique demo   Rolling Left 6: Modified independent (Device/Increase time);Other  (comment)  with good log roll technique demo   Supine to Sit 6: Modified independent (Device/Increase time)  with good log roll technique demo   Sit to Supine 6: Modified independent (Device/Increase time)  with good log roll technique demo                     [facility] PT Treatment/Exercise - 10/23/15 0001    Lumbar Exercises: Stretches   Active Hamstring Stretch 3 reps;30 seconds   Active Hamstring Stretch Limitations standing with 12in step   Single Knee to Chest Stretch 3 reps;30 seconds;Other (comment)  passive Bil LE   Lower Trunk Rotation Other (comment)  1 set of 15 reps in supine   Piriformis Stretch 3 reps;30 seconds   Piriformis Stretch Limitations manual passive stretches supine position   Lumbar Exercises: Aerobic   Stationary Bike 5 minutes on level 4   Lumbar Exercises: Standing   Other Standing Lumbar Exercises Standing core matrix in frontal plane x 1 set of 10 reps; level 1   Lumbar Exercises: Seated   Hip Flexion on Ball Limitations alternating shd x 2 sets of 10 reps, with 2# each hand    Lumbar Exercises: Supine   Other Supine Lumbar Exercises Seated core bracing with shd flexion to 90 degrees, x 2 sets of 10 reps 4#   Electrical Stimulation   Electrical Stimulation Goals Other (comment)  pt. declined, requested    Manual Therapy   Manual Therapy Passive ROM;Muscle Energy Technique;Joint mobilization   Manual therapy comments Completed independently from other interventions    Joint Mobilization L posterior SIJ mobs in R S/L position 2 sets of 6 reps    Passive ROM B hip PROM into flex/IR/ER x 15 reps each    Other Manual Therapy --   Muscle Energy Technique resisted L hip extension and R hip flexion to correct L anterior rotated innominate, 3 sets of 5 reps with 4 sec hold                PT Education - 10/23/15 0910    Education provided Yes   Education Details HEP and core bracing with functional mobility activities    Person(s)  Educated Patient   Methods Explanation;Demonstration   Comprehension Verbalized understanding;Returned demonstration          PT Short Term Goals - 10/23/15 2230    PT SHORT TERM GOAL #1   Title Patient will independently demo full understanding with initial HEP in  order to progress towards improved function.    Baseline Pt required vebal cues for proper technique and parameters   Time 2   Period Weeks   Status Achieved   PT SHORT TERM GOAL #2   Title Patient will report decreased R-sided LBP  to 0-5/10 on a VAS in order to improve his ability to sleep with less than 2 interruptions/ night.   Baseline LBP ranges between a 2-8/10 on a VAS   Time 3   Period Weeks   Status Partially Met   PT SHORT TERM GOAL #3   Title Patient will safely demo proper body mechanics with squats and log rolling in order to reduce stress on the lumbar spine with ADLs and household activities.    Baseline Good technique demo with log rolling technique with no verbal cues required; Improved squatting technique demo, but verbal/tactile cues required to maintain lumbar spine in neutral position    Time 2   Period Weeks   Status Partially Met           PT Long Term Goals - 10/23/15 2232    PT LONG TERM GOAL #1   Title Patient will report decreased back pain to 0-3/10 on a VAS in order to improve outdoor ambulation to >45 minutes.    Baseline R-sded LBP ranges between a 2-8/10 on a VAS; Patient is able to ambulate for 20 minutes.    Time 7   Period Weeks   Status On-going   PT LONG TERM GOAL #2   Title Patient will demo improved LE and core strength to 4+/5 MMT grade in order to improve tolerance with leisure activities.   Baseline Refer to objective measurements for updated MMT   Time 7   Period Weeks   Status On-going   PT LONG TERM GOAL #3   Title Patient will improve his FOTO to <40% limitation in order to progress towards his PLOF with ambulation, leisure activities, and household chores.     Baseline Not formally assessed this visit   Time 7   Period Weeks   Status On-going   PT LONG TERM GOAL #4   Title Patient will be able to lift 15# from low counter with proper squatting technique in order to progress towards work related activities.    Baseline Not formally assessed this visit   Time 7   Period Weeks   Status On-going   PT LONG TERM GOAL #5   Title Patient will improve lumbar spine AROM to [facility] in all directions within post-op restrictions.   Baseline Improved lumbar extension and side bending assessed; refer to objective measurments for updated values    Time 7   Period Weeks   Status On-going               Plan - 10/23/15 0912    Clinical Impression Statement Mr. Walla is a 35 yo male who has been seen for a total of 11 outpatient PT visits with initial complaints of R-sided LBP, low back stiffness, and weakness s/p lumbar laminectomy and microdisectomy at L5-S1 on the right with microdissection of the right S1 nerve root and microscopic discectomy. The patient is making steady progress towards stated PT goals with improved B hip strength, improved lumbar spine AROM into side bending, less guarded movements with functional mobility activities, and less severe/frequent LBP with standing /gait activities. The patient is able to demo improved core bracing with transfers and is also more aware of his posture.  In  addition, his B piriformis muscle flexibility has improved. The patient is tolerating progressed seated core stabilization ther ex with increased resistance. He continues to present with signs and symptoms that are consistent with post-op limitations s/p laminectomy and microdisectomy at L5-S1 including R-sided LBP, impaired B hip/core weakness, limited lumbar AROM into rotation and flexion, impaired quad/piriformis flexibility, and impaired posture. Today's PT tx was focused on progressed core stabilization in sitting with added resistance to 4#, LE  stretches, and manual therapy techniques to restore pelvic alignment. Patient presented with a L anterior rotated innominate in supine, which was restored to proper alignment once MET ( muscle energy technique) and L posterior SIJ mobs were completed. Improved core activation and postural awareness assessed compared to previous PT visits. R-sided LBP was rated a 3-4/10 on a VAS at the completion of today's PT tx. Patient would benefit from continued skilled PT for an additional 2x/week for 6 weeks to focus on core stabilization/strengthening ther ex, hip strengthening, B LE stretches, education on body mechanics, and lumbar ROM within post-op protocol/MD orders. Patient is in agreement with continued skilled PT and is highly motivated to participate with PT services.    Rehab Potential Good   Clinical Impairments Affecting Rehab Potential hx of lumbar disc dicplacement due to work related injury   PT Frequency 2x / week   PT Duration 6 weeks  7 weeks    PT Treatment/Interventions Electrical Stimulation;Moist Heat;Gait training;Functional mobility training;Therapeutic activities;Therapeutic exercise;Manual techniques;Patient/family education;Scar mobilization;Passive range of motion;Taping   PT Next Visit Plan cont with abdominal activation with functional movement, reinforce HEP/ambulation at home, and complete STM to lumbar spine paraspinals to reduce muscle guarding. Continue with IFC e-stim to low back in S/L position to reduce pain and muscle guarding. Progress core stabilization ther ex as tolerated.    PT Home Exercise Plan Reviewed HEP; Current HEP includes= HS stretch, piriformis stretch, ab set in supine/sitting, SKTC, mini squats with UE support, and resisted rows and shd extenstion with red thera-band ;   Consulted and Agree with Plan of Care Patient      Patient will benefit from skilled therapeutic intervention in order to improve the following deficits and impairments:  Abnormal gait,  Decreased endurance, Hypomobility, Decreased scar mobility, Decreased strength, Pain, Decreased activity tolerance, Difficulty walking, Improper body mechanics, Decreased range of motion, Impaired flexibility, Postural dysfunction, Decreased mobility  Visit Diagnosis: Right-sided low back pain without sciatica - Plan: PT plan of care cert/re-cert  Muscle weakness (generalized) - Plan: PT plan of care cert/re-cert  Other abnormalities of gait and mobility - Plan: PT plan of care cert/re-cert     Problem List There are no active problems to display for this patient.   Garen Lah, PT, DPT 10/23/2015, 11:22 PM  [facility] [address] [place], [place], [address] Phone: [phone]   Fax:  [phone]  Name: Nathan Lambert. MRN: [id number] Date of Birth: Jun 26, 1981

## 2015-10-28 ENCOUNTER — Ambulatory Visit (HOSPITAL_COMMUNITY): Payer: BLUE CROSS/BLUE SHIELD

## 2015-10-28 DIAGNOSIS — R2689 Other abnormalities of gait and mobility: Secondary | ICD-10-CM

## 2015-10-28 DIAGNOSIS — M545 Low back pain, unspecified: Secondary | ICD-10-CM

## 2015-10-28 DIAGNOSIS — Z9889 Other specified postprocedural states: Secondary | ICD-10-CM | POA: Diagnosis not present

## 2015-10-28 DIAGNOSIS — M6281 Muscle weakness (generalized): Secondary | ICD-10-CM

## 2015-10-28 NOTE — Therapy (Signed)
[facility] [address] [place], [place], [address] Phone: [phone]   Fax:  [phone]  Physical Therapy Treatment  Patient Details  Name: Nathan Lambert. MRN: [id number] Date of Birth: Nov 17, 1980 Referring Provider: Dr. Kary Kos   Encounter Date: 10/28/2015      PT End of Session - 10/28/15 1155    Visit Number 12   Number of Visits 23   Date for PT Re-Evaluation 11/21/15   Authorization Type BCBS    Authorization Time Period 10/23/2015 to 12/04/2015   PT Start Time 1115   PT Stop Time 1215   PT Time Calculation (min) 60 min   Activity Tolerance Patient tolerated treatment well   Behavior During Therapy [facility] for tasks assessed/performed      Past Medical History  Diagnosis Date  . Allergy 2012    dogs/cats and bactirm    Past Surgical History  Procedure Laterality Date  . Fracture surgery Left     ORIF   . Spine surgery Right 06/16/2015    p lumbar laminectomy and microdisectomy at L5-S1 on the right    There were no vitals filed for this visit.      Subjective Assessment - 10/28/15 1125    Subjective Pt stated pain scale 3-4/10, minimal reports of sharp pain unless makes certain movements.     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    Currently in Pain? Yes   Pain Score 4    Pain Location Back   Pain Orientation Lower;Right   Pain Descriptors / Indicators Aching;Sore   Pain Type Surgical pain;Chronic pain   Pain Radiating Towards no radicular pain   Pain Onset More than a month ago   Pain Frequency Intermittent   Aggravating Factors  twisting, long distance ambulation >20 minutes, and lying in prone   Pain Relieving Factors prescribed pain meds and sitting   Effect of Pain on Daily Activities limited with yard work activities               [facility] Adult PT Treatment/Exercise - 10/28/15 0001    Lumbar Exercises: Stretches   Active Hamstring Stretch 3 reps;30 seconds   Active Hamstring Stretch Limitations passive supine and standing   Single Knee to Chest Stretch 3 reps;30 seconds  passive BLE   Lower Trunk Rotation --  15x 5" holds   Piriformis Stretch 3 reps;30 seconds   Piriformis Stretch Limitations manual passive stretches supine position   Lumbar Exercises: Aerobic   Stationary Bike 5 minutes on level 4   Lumbar Exercises: Standing   Other Standing Lumbar Exercises Yellow theraband isometric then punch 2sets x 10 reps   Other Standing Lumbar Exercises Standing core matrix in frontal plane x 1 set of 10 reps; level 1   Lumbar Exercises: Seated   Hip Flexion on Ball Limitations alternating shd x 2 sets of 10 reps, with 2# each hand    Lumbar Exercises: Supine   Other Supine Lumbar Exercises Seated core bracing with shd flexion to 90 degrees, x 2 sets of 10 reps 4#   Modalities   Modalities Electrical Stimulation   Electrical Stimulation   Electrical Stimulation Location Lumbar spine; 4 pads ( 2 on right and 2 on left);    Electrical Stimulation Action IFC Estim for  pain reduction   Electrical Stimulation Parameters IFES estim intensity set per pt. tolerance   Electrical Stimulation Goals Pain  skin integrity WNL following tx   Manual Therapy   Manual Therapy Passive ROM;Muscle Energy Technique;Joint mobilization   Manual therapy comments Completed independently from other interventions    Soft tissue mobilization pt declined   Passive ROM B hip PROM into flex/IR/ER x 15 reps each                   PT Short Term Goals - 10/23/15 2230    PT SHORT TERM GOAL #1   Title Patient will independently demo full understanding with initial HEP in order to progress towards improved function.    Baseline Pt required vebal cues for proper technique and parameters   Time 2   Period Weeks   Status Achieved   PT  SHORT TERM GOAL #2   Title Patient will report decreased R-sided LBP  to 0-5/10 on a VAS in order to improve his ability to sleep with less than 2 interruptions/ night.   Baseline LBP ranges between a 2-8/10 on a VAS   Time 3   Period Weeks   Status Partially Met   PT SHORT TERM GOAL #3   Title Patient will safely demo proper body mechanics with squats and log rolling in order to reduce stress on the lumbar spine with ADLs and household activities.    Baseline Good technique demo with log rolling technique with no verbal cues required; Improved squatting technique demo, but verbal/tactile cues required to maintain lumbar spine in neutral position    Time 2   Period Weeks   Status Partially Met           PT Long Term Goals - 10/23/15 2232    PT LONG TERM GOAL #1   Title Patient will report decreased back pain to 0-3/10 on a VAS in order to improve outdoor ambulation to >45 minutes.    Baseline R-sded LBP ranges between a 2-8/10 on a VAS; Patient is able to ambulate for 20 minutes.    Time 7   Period Weeks   Status On-going   PT LONG TERM GOAL #2   Title Patient will demo improved LE and core strength to 4+/5 MMT grade in order to improve tolerance with leisure activities.   Baseline Refer to objective measurements for updated MMT   Time 7   Period Weeks   Status On-going   PT LONG TERM GOAL #3   Title Patient will improve his FOTO to <40% limitation in order to progress towards his PLOF with ambulation, leisure activities, and household chores.    Baseline Not formally assessed this visit   Time 7   Period Weeks   Status On-going   PT LONG TERM GOAL #4   Title Patient will be able to lift 15# from low counter with proper squatting technique in order to progress towards work related activities.    Baseline Not formally assessed this visit   Time 7   Period Weeks   Status On-going   PT LONG TERM GOAL #5   Title Patient will improve lumbar spine AROM to [facility] in all directions  within post-op restrictions.   Baseline Improved lumbar extension and side bending assessed; refer to objective measurments for updated values    Time 7   Period Weeks   Status On-going               Plan -  10/28/15 1543    Clinical Impression Statement Session focus on improving spinal and LE mobilty to reduce stiffness, core strengthening and pain relief.  Began session on recumbent stationary bicycle as a warm up to improve LE mobility.  Passive and active stretches complete for LE flexibility.  Pt continues to present with moderate muscle guarding with all exercises especially with any spinal rotation movements due to reports of increased pain with LTR to Lt.  Added core strengthening with theraband resistance isometric exercise with verbal cueing and demonstration for proper technique.  End of session with estim IFC for pain control following increased pain with LTR to Lt, pt reported pain reduced following modality.  Upon removal of eletrodes skin integrity WNL.   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 Continue with IFC e-stim to low back in S/L position to reduce pain and muscle guarding. Progress core stabilization ther ex as tolerated.    PT Home Exercise Plan Reviewed HEP; Current HEP includes= HS stretch, piriformis stretch, ab set in supine/sitting, SKTC, mini squats with UE support, and resisted rows and shd extenstion with  red thera-band ;      Patient will benefit from skilled therapeutic intervention in order to improve the following deficits and impairments:  Abnormal gait, Decreased endurance, Hypomobility, Decreased scar mobility, Decreased strength, Pain, Decreased activity tolerance, Difficulty walking, Improper body mechanics, Decreased range of motion, Impaired flexibility, Postural dysfunction, Decreased mobility  Visit Diagnosis: Right-sided low back pain without sciatica  Muscle weakness (generalized)  Other abnormalities of gait and mobility     Problem List There are no active problems to display for this patient.  [address] LPTA; [place]  Aldona Lento 10/28/2015, 4:06 PM  [facility] [address] [place], [place], [address] Phone: [phone]   Fax:  [phone]  Name: Nathan Lambert. MRN: [id number] Date of Birth: 1980/12/18

## 2015-10-30 ENCOUNTER — Encounter (HOSPITAL_COMMUNITY): Payer: BLUE CROSS/BLUE SHIELD

## 2015-10-31 ENCOUNTER — Ambulatory Visit (HOSPITAL_COMMUNITY): Payer: BLUE CROSS/BLUE SHIELD | Admitting: Physical Therapy

## 2015-10-31 DIAGNOSIS — M6281 Muscle weakness (generalized): Secondary | ICD-10-CM

## 2015-10-31 DIAGNOSIS — R2689 Other abnormalities of gait and mobility: Secondary | ICD-10-CM

## 2015-10-31 DIAGNOSIS — Z9889 Other specified postprocedural states: Secondary | ICD-10-CM | POA: Diagnosis not present

## 2015-10-31 DIAGNOSIS — M545 Low back pain, unspecified: Secondary | ICD-10-CM

## 2015-10-31 NOTE — Therapy (Signed)
[facility] [address] [place], [place], [address] Phone: [phone]   Fax:  [phone]  Physical Therapy Treatment  Patient Details  Name: Nathan Lambert. MRN: [id number] Date of Birth: [birth year] Referring Provider: Dr. Kary Kos   Encounter Date: 10/31/2015      PT End of Session - 10/31/15 1731    Visit Number 13   Number of Visits 23   Date for PT Re-Evaluation 11/21/15   Authorization Type BCBS    Authorization Time Period 10/23/2015 to 12/04/2015   PT Start Time 1657  pt arrived late   PT Stop Time 1729   PT Time Calculation (min) 32 min   Activity Tolerance Patient tolerated treatment well   Behavior During Therapy [facility] for tasks assessed/performed      Past Medical History  Diagnosis Date  . Allergy 2012    dogs/cats and bactirm    Past Surgical History  Procedure Laterality Date  . Fracture surgery Left     ORIF   . Spine surgery Right 06/16/2015    p lumbar laminectomy and microdisectomy at L5-S1 on the right    There were no vitals filed for this visit.      Subjective Assessment - 10/31/15 1659    Subjective Pt arrived 15 minutes late. States he is experiencing 2 or 3/10 pain currently. HEP and walking is going good so far.   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 Yes   Pain Score 3    Pain Location Back   Pain Orientation Right;Lower   Pain Descriptors / Indicators Aching;Sore   Pain Type Chronic pain   Pain Radiating Towards no radicular pain   Pain Onset More than a month ago   Pain Frequency Intermittent                         [facility] Adult PT Treatment/Exercise - 10/31/15 0001    Lumbar Exercises: Stretches   Active Hamstring Stretch 3 reps;30 seconds   Active Hamstring Stretch Limitations passive supine and standing   Lower Trunk Rotation Other (comment)  x15 reps   Lumbar Exercises: Standing   Other Standing Lumbar Exercises  Yellow theraband isometric then punch 1 set x 20 reps   Lumbar Exercises: Seated   Hip Flexion on Ball Limitations alternating shd x3 sets of 1 min, 2#   Lumbar Exercises: Supine   Straight Leg Raise 5 reps  with UE pressdown (+) pelvic rotation noted with R SLR   Other Supine Lumbar Exercises seated alt knee flexion x10 reps                PT Education - 10/31/15 1730    Education provided Yes   Education Details reviewed HEP and importance of core bracing during functional activity.   Person(s) Educated Patient   Methods Explanation;Demonstration   Comprehension Returned demonstration;Verbalized understanding          PT Short Term Goals - 10/23/15 2230    PT SHORT TERM GOAL #1   Title Patient will independently demo full understanding with initial HEP in order to progress towards improved function.    Baseline Pt required vebal cues for proper technique and parameters   Time 2   Period Weeks   Status Achieved   PT SHORT TERM GOAL #2   Title Patient will report decreased R-sided LBP  to 0-5/10 on a VAS in  order to improve his ability to sleep with less than 2 interruptions/ night.   Baseline LBP ranges between a 2-8/10 on a VAS   Time 3   Period Weeks   Status Partially Met   PT SHORT TERM GOAL #3   Title Patient will safely demo proper body mechanics with squats and log rolling in order to reduce stress on the lumbar spine with ADLs and household activities.    Baseline Good technique demo with log rolling technique with no verbal cues required; Improved squatting technique demo, but verbal/tactile cues required to maintain lumbar spine in neutral position    Time 2   Period Weeks   Status Partially Met           PT Long Term Goals - 10/23/15 2232    PT LONG TERM GOAL #1   Title Patient will report decreased back pain to 0-3/10 on a VAS in order to improve outdoor ambulation to >45 minutes.    Baseline R-sded LBP ranges between a 2-8/10 on a VAS; Patient is  able to ambulate for 20 minutes.    Time 7   Period Weeks   Status On-going   PT LONG TERM GOAL #2   Title Patient will demo improved LE and core strength to 4+/5 MMT grade in order to improve tolerance with leisure activities.   Baseline Refer to objective measurements for updated MMT   Time 7   Period Weeks   Status On-going   PT LONG TERM GOAL #3   Title Patient will improve his FOTO to <40% limitation in order to progress towards his PLOF with ambulation, leisure activities, and household chores.    Baseline Not formally assessed this visit   Time 7   Period Weeks   Status On-going   PT LONG TERM GOAL #4   Title Patient will be able to lift 15# from low counter with proper squatting technique in order to progress towards work related activities.    Baseline Not formally assessed this visit   Time 7   Period Weeks   Status On-going   PT LONG TERM GOAL #5   Title Patient will improve lumbar spine AROM to [facility] in all directions within post-op restrictions.   Baseline Improved lumbar extension and side bending assessed; refer to objective measurments for updated values    Time 7   Period Weeks   Status On-going               Plan - 10/31/15 1731    Clinical Impression Statement Pt arrived late to today's session, so primary focus was on core stabilization activity for improved activation during functional tasks. Pt demonstrating decreased trunk movement and guarding during today's session with reported fatigue during several activities. Pt with good tolerance to activity without increase in LBP by the end of today's session. Will continue with current POC.   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 Continue with IFC e-stim to low back in S/L position to reduce pain and muscle guarding. Progress core stabilization ther ex as  tolerated.    PT Home Exercise Plan Reviewed HEP; Current HEP includes= HS stretch, piriformis stretch, ab set in supine/sitting, SKTC, mini squats with UE support, and resisted rows and shd extenstion with red thera-band ;   Consulted and Agree with Plan of Care Patient      Patient will benefit from skilled therapeutic intervention in order to improve the following deficits and impairments:  Abnormal gait, Decreased endurance, Hypomobility, Decreased scar mobility, Decreased strength, Pain, Decreased activity tolerance, Difficulty walking, Improper body mechanics, Decreased range of motion, Impaired flexibility, Postural dysfunction, Decreased mobility  Visit Diagnosis: Right-sided low back pain without sciatica  Muscle weakness (generalized)  Other abnormalities of gait and mobility     Problem List There are no active problems to display for this patient.  5:38 PM,10/31/2015 Elly Modena PT, DPT [facility] [address] [place], [place], [address] Phone: [phone]   Fax:  [phone]  Name: Nathan Lambert. MRN: [id number] Date of Birth: 23-Jun-1981

## 2015-11-04 ENCOUNTER — Ambulatory Visit (HOSPITAL_COMMUNITY): Payer: BLUE CROSS/BLUE SHIELD

## 2015-11-04 DIAGNOSIS — M545 Low back pain, unspecified: Secondary | ICD-10-CM

## 2015-11-04 DIAGNOSIS — Z9889 Other specified postprocedural states: Secondary | ICD-10-CM | POA: Diagnosis not present

## 2015-11-04 DIAGNOSIS — R2689 Other abnormalities of gait and mobility: Secondary | ICD-10-CM

## 2015-11-04 DIAGNOSIS — M6281 Muscle weakness (generalized): Secondary | ICD-10-CM

## 2015-11-04 NOTE — Therapy (Signed)
[facility] [address] [place], [place], [address] Phone: [phone]   Fax:  [phone]  Physical Therapy Treatment  Patient Details  Name: Nathan Lambert. MRN: [id number] Date of Birth: 1980/11/25 Referring Provider: Dr. Kary Kos   Encounter Date: 11/04/2015      PT End of Session - 11/04/15 1046    Visit Number 14   Number of Visits 23   Date for PT Re-Evaluation 11/21/15   Authorization Type BCBS    Authorization Time Period 10/23/2015 to 12/04/2015   PT Start Time 1040   PT Stop Time 1120   PT Time Calculation (min) 40 min   Activity Tolerance Patient tolerated treatment well   Behavior During Therapy [facility] for tasks assessed/performed      Past Medical History  Diagnosis Date  . Allergy 2012    dogs/cats and bactirm    Past Surgical History  Procedure Laterality Date  . Fracture surgery Left     ORIF   . Spine surgery Right 06/16/2015    p lumbar laminectomy and microdisectomy at L5-S1 on the right    There were no vitals filed for this visit.      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    Currently in Pain? Yes   Pain Score 3    Pain Location Back   Pain Orientation Lower;Mid   Pain Descriptors / Indicators Aching   Pain Type Chronic pain   Pain Radiating Towards no radicular pain   Pain Onset More than a month ago   Pain Frequency Intermittent   Aggravating Factors  twisting, long distance ambulation >20 minutes, and lying in prone   Pain Relieving Factors prescribed pain meds and sitting   Effect of Pain on Daily Activities limited with yard work activities             [facility] Adult PT Treatment/Exercise - 11/04/15 0001    Lumbar  Exercises: Stretches   Active Hamstring Stretch 3 reps;30 seconds   Active Hamstring Stretch Limitations standing 2nd step on stairs   Lower Trunk Rotation Other (comment)  15x   Lumbar Exercises: Standing   Wall Slides 10 reps;3 seconds   Other Standing Lumbar Exercises Standing core matrix in frontal plane x 1 set of 10 reps; level 1   Lumbar Exercises: Supine   Straight Leg Raise 5 reps  with UE pressdown (+) pelvic rotatin noted with Rt SLR   Manual Therapy   Manual Therapy Soft tissue mobilization   Manual therapy comments Completed independently from other interventions    Soft tissue mobilization Lt sidelying STM to lower back, quadratus lumborum and paraspinals            PT Short Term Goals - 10/23/15 2230    PT SHORT TERM GOAL #1   Title Patient will independently demo full understanding with initial HEP in order to progress towards improved function.    Baseline Pt required vebal cues for proper technique and parameters   Time 2   Period Weeks   Status Achieved   PT SHORT TERM GOAL #2   Title Patient will report decreased R-sided LBP  to 0-5/10 on a  VAS in order to improve his ability to sleep with less than 2 interruptions/ night.   Baseline LBP ranges between a 2-8/10 on a VAS   Time 3   Period Weeks   Status Partially Met   PT SHORT TERM GOAL #3   Title Patient will safely demo proper body mechanics with squats and log rolling in order to reduce stress on the lumbar spine with ADLs and household activities.    Baseline Good technique demo with log rolling technique with no verbal cues required; Improved squatting technique demo, but verbal/tactile cues required to maintain lumbar spine in neutral position    Time 2   Period Weeks   Status Partially Met           PT Long Term Goals - 10/23/15 2232    PT LONG TERM GOAL #1   Title Patient will report decreased back pain to 0-3/10 on a VAS in order to improve outdoor ambulation to >45 minutes.    Baseline  R-sded LBP ranges between a 2-8/10 on a VAS; Patient is able to ambulate for 20 minutes.    Time 7   Period Weeks   Status On-going   PT LONG TERM GOAL #2   Title Patient will demo improved LE and core strength to 4+/5 MMT grade in order to improve tolerance with leisure activities.   Baseline Refer to objective measurements for updated MMT   Time 7   Period Weeks   Status On-going   PT LONG TERM GOAL #3   Title Patient will improve his FOTO to <40% limitation in order to progress towards his PLOF with ambulation, leisure activities, and household chores.    Baseline Not formally assessed this visit   Time 7   Period Weeks   Status On-going   PT LONG TERM GOAL #4   Title Patient will be able to lift 15# from low counter with proper squatting technique in order to progress towards work related activities.    Baseline Not formally assessed this visit   Time 7   Period Weeks   Status On-going   PT LONG TERM GOAL #5   Title Patient will improve lumbar spine AROM to [facility] in all directions within post-op restrictions.   Baseline Improved lumbar extension and side bending assessed; refer to objective measurments for updated values    Time 7   Period Weeks   Status On-going               Plan - 11/04/15 1127    Clinical Impression Statement Session focus on improvig core activaitn and manual/verbal technqiues to reduce muscle guarding for pain.  Pt continues to demonstrate decreased trunk rotation with increased muscle guarding and reports of increased pain with rotation.  Manual technqiues complete to reduce lumbar musculature tightness, utilized breathing techniques to assist with muscle guards, noted trigger point Rt QL.  End of session noted improved gait mechanics, no reports of increased pain.   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 Continue with IFC e-stim to low back in  S/L position to reduce pain and muscle guarding. Progress core stabilization ther ex as tolerated.       Patient will benefit from skilled therapeutic intervention in order to improve the following deficits and impairments:  Abnormal gait, Decreased endurance, Hypomobility, Decreased scar mobility, Decreased strength, Pain, Decreased activity tolerance, Difficulty walking, Improper body mechanics, Decreased range of motion, Impaired flexibility, Postural dysfunction, Decreased mobility  Visit Diagnosis: Right-sided low back pain without sciatica  Muscle weakness (generalized)  Other abnormalities of gait and mobility     Problem List There are no active problems to display for this patient.  [address], LPTA; CBIS [phone]  Aldona Lento 11/04/2015, 1:00 PM  [facility] [address] [place], [place], [address] Phone: [phone]   Fax:  [phone]  Name: Nathan Lambert. MRN: [id number] Date of Birth: 1981-03-07

## 2015-11-06 ENCOUNTER — Ambulatory Visit (HOSPITAL_COMMUNITY): Payer: BLUE CROSS/BLUE SHIELD

## 2015-11-06 DIAGNOSIS — M545 Low back pain, unspecified: Secondary | ICD-10-CM

## 2015-11-06 DIAGNOSIS — R2689 Other abnormalities of gait and mobility: Secondary | ICD-10-CM

## 2015-11-06 DIAGNOSIS — Z9889 Other specified postprocedural states: Secondary | ICD-10-CM | POA: Diagnosis not present

## 2015-11-06 DIAGNOSIS — M6281 Muscle weakness (generalized): Secondary | ICD-10-CM

## 2015-11-06 NOTE — Therapy (Signed)
[facility] [address] [place], [place], [address] Phone: [phone]   Fax:  [phone]  Physical Therapy Treatment  Patient Details  Name: Nathan Lambert. MRN: [id number] Date of Birth: 1981/05/16 Referring Provider: Dr. Kary Kos   Encounter Date: 11/06/2015      PT End of Session - 11/06/15 0840    Visit Number 15   Number of Visits 23   Date for PT Re-Evaluation 11/21/15   Authorization Type BCBS    Authorization Time Period 10/23/2015 to 12/04/2015   PT Start Time 0815   PT Stop Time 0900   PT Time Calculation (min) 45 min   Activity Tolerance Patient tolerated treatment well   Behavior During Therapy [facility] for tasks assessed/performed      Past Medical History  Diagnosis Date  . Allergy 2012    dogs/cats and bactirm    Past Surgical History  Procedure Laterality Date  . Fracture surgery Left     ORIF   . Spine surgery Right 06/16/2015    p lumbar laminectomy and microdisectomy at L5-S1 on the right    There were no vitals filed for this visit.      Subjective Assessment - 11/06/15 0820    Subjective Reports relief following massage last session, feels extra stiff today, has not taken any medication this morning.  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    Currently in Pain? Yes   Pain Score 4    Pain Location Back   Pain Orientation Lower;Mid   Pain Descriptors / Indicators --  stiffness   Pain Type Chronic pain   Pain Radiating Towards no radicular pain   Pain Onset More than a month ago   Pain Frequency Intermittent   Aggravating Factors  twisting, long distance ambulation >20 minutes, and lying in prone   Pain Relieving Factors prescribed pain meds and sitting   Effect of Pain on Daily Activities  limited with yard work activities                         [facility] Adult PT Treatment/Exercise - 11/06/15 0001    Lumbar Exercises: Stretches   Active Hamstring Stretch 3 reps;30 seconds   Active Hamstring Stretch Limitations standing 2nd step on stairs   Single Knee to Chest Stretch 3 reps;30 seconds   Lower Trunk Rotation Other (comment)  15x   Piriformis Stretch 3 reps;30 seconds   Piriformis Stretch Limitations manual passive stretches supine position   Lumbar Exercises: Aerobic   Stationary Bike 5 minutes on level 4 (warmup no charge)   Lumbar Exercises: Standing   Functional Squats 10 reps;Other (comment)  UE support   Other Standing Lumbar Exercises Yellow theraband isometric then punch 1 set x 20 reps   Other Standing Lumbar Exercises Standing core matrix in frontal plane x 1 set of 10 reps; level 1   Lumbar Exercises: Supine   Straight Leg Raise 5 reps  UE Pressdown (+)  pelvic rotation noted wiht Rt SLR                  PT Short Term Goals - 10/23/15 2230    PT SHORT TERM GOAL #1   Title  Patient will independently demo full understanding with initial HEP in order to progress towards improved function.    Baseline Pt required vebal cues for proper technique and parameters   Time 2   Period Weeks   Status Achieved   PT SHORT TERM GOAL #2   Title Patient will report decreased R-sided LBP  to 0-5/10 on a VAS in order to improve his ability to sleep with less than 2 interruptions/ night.   Baseline LBP ranges between a 2-8/10 on a VAS   Time 3   Period Weeks   Status Partially Met   PT SHORT TERM GOAL #3   Title Patient will safely demo proper body mechanics with squats and log rolling in order to reduce stress on the lumbar spine with ADLs and household activities.    Baseline Good technique demo with log rolling technique with no verbal cues required; Improved squatting technique demo, but verbal/tactile cues required to maintain lumbar spine in  neutral position    Time 2   Period Weeks   Status Partially Met           PT Long Term Goals - 10/23/15 2232    PT LONG TERM GOAL #1   Title Patient will report decreased back pain to 0-3/10 on a VAS in order to improve outdoor ambulation to >45 minutes.    Baseline R-sded LBP ranges between a 2-8/10 on a VAS; Patient is able to ambulate for 20 minutes.    Time 7   Period Weeks   Status On-going   PT LONG TERM GOAL #2   Title Patient will demo improved LE and core strength to 4+/5 MMT grade in order to improve tolerance with leisure activities.   Baseline Refer to objective measurements for updated MMT   Time 7   Period Weeks   Status On-going   PT LONG TERM GOAL #3   Title Patient will improve his FOTO to <40% limitation in order to progress towards his PLOF with ambulation, leisure activities, and household chores.    Baseline Not formally assessed this visit   Time 7   Period Weeks   Status On-going   PT LONG TERM GOAL #4   Title Patient will be able to lift 15# from low counter with proper squatting technique in order to progress towards work related activities.    Baseline Not formally assessed this visit   Time 7   Period Weeks   Status On-going   PT LONG TERM GOAL #5   Title Patient will improve lumbar spine AROM to [facility] in all directions within post-op restrictions.   Baseline Improved lumbar extension and side bending assessed; refer to objective measurments for updated values    Time 7   Period Weeks   Status On-going               Plan - 11/06/15 1054    Clinical Impression Statement Session focus on improving core strengthening and lumbar/ LE mobility to reduce stiffness, reduce pain and improve gait mechanuics.  Began on bike as warm-up to reduce stiffness (non-chargable).  Pt. demonstrated reduced muscle guarding with exercises today with less cueing required to improve breathing and improved tolerance with weight shifting.  Pt conintues to  demonstrate decreased trunk rotation and reports of discomfort with Lt rotation to Rt lower back.  Therapist facilitation required to improve core activation with exercises and reduce compensation.  End of session noted improve gait mechanics and reports of significant reduction of stiffness.  Pt declined offer for soft tissue mobilization.   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 Continue with IFC e-stim to low back in S/L position to reduce pain and muscle guarding. Progress core stabilization ther ex as tolerated.    PT Home Exercise Plan Reviewed HEP; Current HEP includes= HS stretch, piriformis stretch, ab set in supine/sitting, SKTC, mini squats with UE support, and resisted rows and shd extenstion with red thera-band ;      Patient will benefit from skilled therapeutic intervention in order to improve the following deficits and impairments:  Abnormal gait, Decreased endurance, Hypomobility, Decreased scar mobility, Decreased strength, Pain, Decreased activity tolerance, Difficulty walking, Improper body mechanics, Decreased range of motion, Impaired flexibility, Postural dysfunction, Decreased mobility  Visit Diagnosis: Right-sided low back pain without sciatica  Muscle weakness (generalized)  Other abnormalities of gait and mobility     Problem List There are no active problems to display for this patient.  [address]  LPTA; [place]  Aldona Lento 11/06/2015, 11:10 AM  [facility] [place], [place], [address] Phone: [phone]   Fax:  [phone]  Name: Sharron Simpson. MRN: [id number] Date of Birth: April 26, 1981

## 2015-11-11 ENCOUNTER — Encounter (HOSPITAL_COMMUNITY): Payer: BLUE CROSS/BLUE SHIELD | Admitting: Physical Therapy

## 2015-11-11 ENCOUNTER — Telehealth (HOSPITAL_COMMUNITY): Payer: Self-pay

## 2015-11-11 NOTE — Telephone Encounter (Signed)
11/11/15  CX but no reason given

## 2015-11-13 ENCOUNTER — Ambulatory Visit (HOSPITAL_COMMUNITY): Payer: BLUE CROSS/BLUE SHIELD

## 2015-11-13 DIAGNOSIS — M545 Low back pain, unspecified: Secondary | ICD-10-CM

## 2015-11-13 DIAGNOSIS — M6281 Muscle weakness (generalized): Secondary | ICD-10-CM

## 2015-11-13 DIAGNOSIS — R2689 Other abnormalities of gait and mobility: Secondary | ICD-10-CM

## 2015-11-13 DIAGNOSIS — Z9889 Other specified postprocedural states: Secondary | ICD-10-CM | POA: Diagnosis not present

## 2015-11-13 NOTE — Therapy (Signed)
[facility] [address] [place], [place], [address] Phone: [phone]   Fax:  [phone]  Physical Therapy Treatment  Patient Details  Name: Nathan Lambert. MRN: [id number] Date of Birth: [birth year] Referring Provider: Dr. Kary Kos   Encounter Date: 11/13/2015      PT End of Session - 11/13/15 0943    Visit Number 16   Number of Visits 23   Date for PT Re-Evaluation 11/21/15   Authorization Type BCBS    Authorization Time Period 10/23/2015 to 12/04/2015   PT Start Time 0904   PT Stop Time 0947   PT Time Calculation (min) 43 min   Activity Tolerance Patient tolerated treatment well   Behavior During Therapy [facility] for tasks assessed/performed      Past Medical History  Diagnosis Date  . Allergy 2012    dogs/cats and bactirm    Past Surgical History  Procedure Laterality Date  . Fracture surgery Left     ORIF   . Spine surgery Right 06/16/2015    p lumbar laminectomy and microdisectomy at L5-S1 on the right    There were no vitals filed for this visit.      Subjective Assessment - 11/13/15 0905    Subjective Pt stated back was feeling pretty good today, pain scale 2/10 in center of lower back.  Pt with big smile on face and reported he tied his shoes by crossing legs for first time since September.   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    Currently in Pain? Yes   Pain Score 2    Pain Location Back   Pain Orientation Lower;Mid   Pain Descriptors / Indicators Aching  stiffness   Pain Type Chronic pain   Pain Radiating Towards no radicular pain   Pain Onset More than a month ago   Pain Frequency Intermittent   Aggravating Factors  twisting, long distance ambulation >20 minutes, and lying in prone   Pain Relieving Factors prescribed pain meds and  sitting   Effect of Pain on Daily Activities limited with yard work activities             [place] Adult PT Treatment/Exercise - 11/13/15 0001    Lumbar Exercises: Stretches   Active Hamstring Stretch 3 reps;30 seconds   Active Hamstring Stretch Limitations standing 2nd step on stairs   Single Knee to Chest Stretch 3 reps;30 seconds   Piriformis Stretch 3 reps;30 seconds   Piriformis Stretch Limitations seated    Lumbar Exercises: Aerobic   Stationary Bike 5 minutes on level 4 (warmup no charge)   Lumbar Exercises: Standing   Functional Squats 10 reps;Other (comment)  UE A   Other Standing Lumbar Exercises Yellow theraband isometric then punch 1 set x 20 reps   Other Standing Lumbar Exercises Standing core matrix in frontal and sagital plane (no transverse) x 1 set of 10 reps; level 1  2 reps of transverse plane with heel raise   Lumbar Exercises: Supine   Straight Leg Raise 5 reps  2 sets x 5 reps (with UE pressdown (+) pelvis rotation R SLR                  PT Short Term Goals - 10/23/15 2230    PT SHORT TERM GOAL #  1   Title Patient will independently demo full understanding with initial HEP in order to progress towards improved function.    Baseline Pt required vebal cues for proper technique and parameters   Time 2   Period Weeks   Status Achieved   PT SHORT TERM GOAL #2   Title Patient will report decreased R-sided LBP  to 0-5/10 on a VAS in order to improve his ability to sleep with less than 2 interruptions/ night.   Baseline LBP ranges between a 2-8/10 on a VAS   Time 3   Period Weeks   Status Partially Met   PT SHORT TERM GOAL #3   Title Patient will safely demo proper body mechanics with squats and log rolling in order to reduce stress on the lumbar spine with ADLs and household activities.    Baseline Good technique demo with log rolling technique with no verbal cues required; Improved squatting technique demo, but verbal/tactile cues required to  maintain lumbar spine in neutral position    Time 2   Period Weeks   Status Partially Met           PT Long Term Goals - 10/23/15 2232    PT LONG TERM GOAL #1   Title Patient will report decreased back pain to 0-3/10 on a VAS in order to improve outdoor ambulation to >45 minutes.    Baseline R-sded LBP ranges between a 2-8/10 on a VAS; Patient is able to ambulate for 20 minutes.    Time 7   Period Weeks   Status On-going   PT LONG TERM GOAL #2   Title Patient will demo improved LE and core strength to 4+/5 MMT grade in order to improve tolerance with leisure activities.   Baseline Refer to objective measurements for updated MMT   Time 7   Period Weeks   Status On-going   PT LONG TERM GOAL #3   Title Patient will improve his FOTO to <40% limitation in order to progress towards his PLOF with ambulation, leisure activities, and household chores.    Baseline Not formally assessed this visit   Time 7   Period Weeks   Status On-going   PT LONG TERM GOAL #4   Title Patient will be able to lift 15# from low counter with proper squatting technique in order to progress towards work related activities.    Baseline Not formally assessed this visit   Time 7   Period Weeks   Status On-going   PT LONG TERM GOAL #5   Title Patient will improve lumbar spine AROM to [facility] in all directions within post-op restrictions.   Baseline Improved lumbar extension and side bending assessed; refer to objective measurments for updated values    Time 7   Period Weeks   Status On-going               Plan - 11/13/15 0943    Clinical Impression Statement Session focus on improving core stabilty and lumbar/LE mobilityi to reduce stiffness and reduce pain.  Pt improving mobilty with abiltiy to complete seated piriformis stretch indepdendently.  Pt instructed to begin seated piriformis in addition with HEP for mobilty.  Pt continues to demonstrate muscle guarding with trunk rotation.  Added transverse  plane in standing exercises with modified heel raise with rotation, pt able to complete 2 reps with reports of increased tension with movement, no reports of increased pain.  Soft tissue mobilization technqiues offered to assist with muscle guarding, pt declined  stated he would prefer therex only this session.  Pain scale at EOS at 2/10.  Noted improved mobility wtih gait mechanics.     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 Continue with IFC e-stim to low back in S/L position to reduce pain and muscle guarding. Progress core stabilization ther ex as tolerated.    PT Home Exercise Plan Reviewed compliance with HEP, added seated piriformis stretch      Patient will benefit from skilled therapeutic intervention in order to improve the following deficits and impairments:  Abnormal gait, Decreased endurance, Hypomobility, Decreased scar mobility, Decreased strength, Pain, Decreased activity tolerance, Difficulty walking, Improper body mechanics, Decreased range of motion, Impaired flexibility, Postural dysfunction, Decreased mobility  Visit Diagnosis: Right-sided low back pain without sciatica  Muscle weakness (generalized)  Other abnormalities of gait and mobility     Problem List There are no active problems to display for this patient.  [address] LPTA; [place]  Aldona Lento 11/13/2015, 10:10 AM  [place], [place], [address] Phone: [phone]   Fax:  [phone]  Name: Nathan Lambert. MRN: [id number] Date of Birth: Nov 13, 1980

## 2015-11-19 ENCOUNTER — Ambulatory Visit (HOSPITAL_COMMUNITY): Payer: BLUE CROSS/BLUE SHIELD | Attending: Neurosurgery

## 2015-11-19 DIAGNOSIS — R2689 Other abnormalities of gait and mobility: Secondary | ICD-10-CM | POA: Diagnosis present

## 2015-11-19 DIAGNOSIS — Z9889 Other specified postprocedural states: Secondary | ICD-10-CM | POA: Insufficient documentation

## 2015-11-19 DIAGNOSIS — M6281 Muscle weakness (generalized): Secondary | ICD-10-CM | POA: Diagnosis present

## 2015-11-19 DIAGNOSIS — R269 Unspecified abnormalities of gait and mobility: Secondary | ICD-10-CM | POA: Insufficient documentation

## 2015-11-19 DIAGNOSIS — G8929 Other chronic pain: Secondary | ICD-10-CM | POA: Diagnosis present

## 2015-11-19 DIAGNOSIS — M2560 Stiffness of unspecified joint, not elsewhere classified: Secondary | ICD-10-CM | POA: Diagnosis present

## 2015-11-19 DIAGNOSIS — M5126 Other intervertebral disc displacement, lumbar region: Secondary | ICD-10-CM | POA: Insufficient documentation

## 2015-11-19 DIAGNOSIS — M545 Low back pain, unspecified: Secondary | ICD-10-CM

## 2015-11-19 NOTE — Therapy (Signed)
[facility] [address] [place], [place], [address] Phone: [phone]   Fax:  [phone]  Physical Therapy Treatment  Patient Details  Name: Nathan Lambert. MRN: [id number] Date of Birth: 07-12-1981 Referring Provider: Dr. Kary Kos   Encounter Date: 11/19/2015      PT End of Session - 11/19/15 0945    Visit Number 17   Number of Visits 23   Date for PT Re-Evaluation 11/21/15   Authorization Type BCBS    Authorization Time Period 10/23/2015 to 12/04/2015   PT Start Time 0821  pt came late.    PT Stop Time 0901   PT Time Calculation (min) 40 min   Activity Tolerance Patient limited by pain;Patient tolerated treatment well   Behavior During Therapy Anxious;WFL for tasks assessed/performed      Past Medical History  Diagnosis Date  . Allergy 2012    dogs/cats and bactirm    Past Surgical History  Procedure Laterality Date  . Fracture surgery Left     ORIF   . Spine surgery Right 06/16/2015    p lumbar laminectomy and microdisectomy at L5-S1 on the right    There were no vitals filed for this visit.      Subjective Assessment - 11/19/15 0825    Subjective Pt reports he has been doing well, keepign up with HEP, walking more, and trying to remain fairly active.    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 No limited    How long can you stand comfortably? 10-15 minutes then achiness in lo back    How long can you walk comfortably? 30 minutes, 20 minutes 3 WA    Diagnostic tests MRI= herniated NP at L5-S1   Patient Stated Goals Pt's goal is to reduce his LBP, return to sports related activities, return to work, and to be able to play with his son.    Currently in Pain? Yes   Pain Score 3    Pain Location Back   Pain Orientation Mid   Pain Descriptors / Indicators Aching   Pain Onset More than a month ago   Pain Frequency Occasional   Aggravating Factors  sneezing, prolonged standing >10, walking > 30 minutes   Pain Relieving Factors meds   Effect of Pain on Daily Activities mildly limited    Multiple Pain Sites No                         [facility] Adult PT Treatment/Exercise - 11/19/15 0001    Lumbar Exercises: Stretches   Active Hamstring Stretch 5 reps;10 seconds  Supine, LAQ 15 on/15 off   Active Hamstring Stretch Limitations supine  L HS stretch = R LB pulling   Single Knee to Chest Stretch 3 reps;60 seconds   Lower Trunk Rotation Other (comment)  3x 60sec bilat, painful guarded.    Lumbar Exercises: Standing   Functional Squats --   Lumbar Exercises: Supine   Ab Set 10 reps;5 seconds  hooklying, paired with belly breathing   Lumbar Exercises: Quadruped   Single Arm Raise --  2x10   Straight Leg Raise --  2x10                 PT Education - 11/19/15 0945    Education provided Yes   Education Details encouraged time to relax trunk and turn off spastic  muscles while seated/resting.    Person(s) Educated Patient   Methods Explanation;Demonstration   Comprehension Verbalized understanding          PT Short Term Goals - 10/23/15 2230    PT SHORT TERM GOAL #1   Title Patient will independently demo full understanding with initial HEP in order to progress towards improved function.    Baseline Pt required vebal cues for proper technique and parameters   Time 2   Period Weeks   Status Achieved   PT SHORT TERM GOAL #2   Title Patient will report decreased R-sided LBP  to 0-5/10 on a VAS in order to improve his ability to sleep with less than 2 interruptions/ night.   Baseline LBP ranges between a 2-8/10 on a VAS   Time 3   Period Weeks   Status Partially Met   PT SHORT TERM GOAL #3   Title Patient will safely demo proper body mechanics with squats and log rolling in order to reduce stress on the lumbar spine with ADLs and household activities.    Baseline Good technique demo  with log rolling technique with no verbal cues required; Improved squatting technique demo, but verbal/tactile cues required to maintain lumbar spine in neutral position    Time 2   Period Weeks   Status Partially Met           PT Long Term Goals - 10/23/15 2232    PT LONG TERM GOAL #1   Title Patient will report decreased back pain to 0-3/10 on a VAS in order to improve outdoor ambulation to >45 minutes.    Baseline R-sded LBP ranges between a 2-8/10 on a VAS; Patient is able to ambulate for 20 minutes.    Time 7   Period Weeks   Status On-going   PT LONG TERM GOAL #2   Title Patient will demo improved LE and core strength to 4+/5 MMT grade in order to improve tolerance with leisure activities.   Baseline Refer to objective measurements for updated MMT   Time 7   Period Weeks   Status On-going   PT LONG TERM GOAL #3   Title Patient will improve his FOTO to <40% limitation in order to progress towards his PLOF with ambulation, leisure activities, and household chores.    Baseline Not formally assessed this visit   Time 7   Period Weeks   Status On-going   PT LONG TERM GOAL #4   Title Patient will be able to lift 15# from low counter with proper squatting technique in order to progress towards work related activities.    Baseline Not formally assessed this visit   Time 7   Period Weeks   Status On-going   PT LONG TERM GOAL #5   Title Patient will improve lumbar spine AROM to [facility] in all directions within post-op restrictions.   Baseline Improved lumbar extension and side bending assessed; refer to objective measurments for updated values    Time 7   Period Weeks   Status On-going               Plan - 11/19/15 0946    Clinical Impression Statement Pt remains hypomobile, spastic, and quite anxious abotu movement. Attempted to progress core activities, but session is most limited by additional time required to get the patient to relx while in each stretch. I explained  that its difficult to mobilize joints when the muscles remain spastic aroudn them, even during a stretch. The  pt woudl likely benefit from more soft tissue work about the hips, but was unable to find a tolerable position to perform and ran out of time.    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 Continue with IFC e-stim to low back in S/L position to reduce pain and muscle guarding. Progress core stabilization ther ex as tolerated.    PT Home Exercise Plan asked to continue to progress walking.    Consulted and Agree with Plan of Care Patient      Patient will benefit from skilled therapeutic intervention in order to improve the following deficits and impairments:  Abnormal gait, Decreased endurance, Hypomobility, Decreased scar mobility, Decreased strength, Pain, Decreased activity tolerance, Difficulty walking, Improper body mechanics, Decreased range of motion, Impaired flexibility, Postural dysfunction, Decreased mobility  Visit Diagnosis: Right-sided low back pain without sciatica  Muscle weakness (generalized)  Other abnormalities of gait and mobility  S/P lumbar laminectomy  Disc displacement, lumbar  Chronic right-sided low back pain without sciatica  Muscle weakness  Joint stiffness of spine  Abnormality of gait     Problem List There are no active problems to display for this patient.   9:50 AM, 11/19/2015 Etta Grandchild, PT, DPT PRN  Physical Therapist - [place] License # [id number] [phone] (mobile)   [facility] [address] [place], [place], [address] Phone: [phone]   Fax:  [phone]  Name: Italo Banton. MRN: [id number] Date of Birth: Feb 14, 1981

## 2015-11-24 ENCOUNTER — Ambulatory Visit (HOSPITAL_COMMUNITY): Payer: BLUE CROSS/BLUE SHIELD | Admitting: Physical Therapy

## 2015-11-24 DIAGNOSIS — M545 Low back pain, unspecified: Secondary | ICD-10-CM

## 2015-11-24 DIAGNOSIS — R2689 Other abnormalities of gait and mobility: Secondary | ICD-10-CM

## 2015-11-24 DIAGNOSIS — M6281 Muscle weakness (generalized): Secondary | ICD-10-CM

## 2015-11-24 NOTE — Therapy (Signed)
[facility] [address] [place], [place], [address] Phone: [phone]   Fax:  [phone]  Physical Therapy Treatment  Patient Details  Name: Nathan Lambert. MRN: [id number] Date of Birth: 1980/12/14 Referring Provider: Dr. Kary Kos   Encounter Date: 11/24/2015      PT End of Session - 11/24/15 1056    Visit Number 18   Number of Visits 23   Date for PT Re-Evaluation 11/21/15   Authorization Type BCBS    Authorization Time Period 10/23/2015 to 12/04/2015   PT Start Time 1042  pt arrived late   PT Stop Time 1115   PT Time Calculation (min) 33 min   Activity Tolerance Patient limited by pain;Patient tolerated treatment well   Behavior During Therapy Anxious;WFL for tasks assessed/performed      Past Medical History  Diagnosis Date  . Allergy 2012    dogs/cats and bactirm    Past Surgical History  Procedure Laterality Date  . Fracture surgery Left     ORIF   . Spine surgery Right 06/16/2015    p lumbar laminectomy and microdisectomy at L5-S1 on the right    There were no vitals filed for this visit.      Subjective Assessment - 11/24/15 1046    Subjective Pt reports he has been doing his HEP at home without difficulty. Notes he has been trying to walk more at home, but [place] alot of [place], etc.    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    Currently in Pain? Yes   Pain Score 3    Pain Location Back   Pain Orientation Mid   Pain Descriptors / Indicators Aching   Pain Type Chronic pain   Pain Radiating Towards none   Pain Onset More than a month ago   Pain Frequency Occasional   Aggravating Factors  walking >32mn, standing for long periods of time   Pain  Relieving Factors meds   Multiple Pain Sites No                         [facility] Adult PT Treatment/Exercise - 11/24/15 0001    Exercises   Other Exercises  L sidelying QL stretch 2x30 sec   Lumbar Exercises: Supine   Ab Set 15 reps   Other Supine Lumbar Exercises ab set with alt bent knee raise x15   Manual Therapy   Manual Therapy Soft tissue mobilization   Manual therapy comments Completed independently from other interventions    Soft tissue mobilization Prone on pillows: STM to lower back, quadratus lumborum and paraspinals                PT Education - 11/24/15 1053    Education provided Yes   Education Details updated HEP; disucssed importance of movement for pt's with backpain and encouraged him to begin incorportaing more lumbar movement throughout his day   Person(s) Educated Patient   Methods Explanation;Demonstration;Handout   Comprehension Verbalized understanding;Returned demonstration;Need further instruction          PT Short Term Goals - 10/23/15 2230    PT SHORT TERM GOAL #1   Title Patient will  independently demo full understanding with initial HEP in order to progress towards improved function.    Baseline Pt required vebal cues for proper technique and parameters   Time 2   Period Weeks   Status Achieved   PT SHORT TERM GOAL #2   Title Patient will report decreased R-sided LBP  to 0-5/10 on a VAS in order to improve his ability to sleep with less than 2 interruptions/ night.   Baseline LBP ranges between a 2-8/10 on a VAS   Time 3   Period Weeks   Status Partially Met   PT SHORT TERM GOAL #3   Title Patient will safely demo proper body mechanics with squats and log rolling in order to reduce stress on the lumbar spine with ADLs and household activities.    Baseline Good technique demo with log rolling technique with no verbal cues required; Improved squatting technique demo, but verbal/tactile cues required to maintain lumbar spine  in neutral position    Time 2   Period Weeks   Status Partially Met           PT Long Term Goals - 10/23/15 2232    PT LONG TERM GOAL #1   Title Patient will report decreased back pain to 0-3/10 on a VAS in order to improve outdoor ambulation to >45 minutes.    Baseline R-sded LBP ranges between a 2-8/10 on a VAS; Patient is able to ambulate for 20 minutes.    Time 7   Period Weeks   Status On-going   PT LONG TERM GOAL #2   Title Patient will demo improved LE and core strength to 4+/5 MMT grade in order to improve tolerance with leisure activities.   Baseline Refer to objective measurements for updated MMT   Time 7   Period Weeks   Status On-going   PT LONG TERM GOAL #3   Title Patient will improve his FOTO to <40% limitation in order to progress towards his PLOF with ambulation, leisure activities, and household chores.    Baseline Not formally assessed this visit   Time 7   Period Weeks   Status On-going   PT LONG TERM GOAL #4   Title Patient will be able to lift 15# from low counter with proper squatting technique in order to progress towards work related activities.    Baseline Not formally assessed this visit   Time 7   Period Weeks   Status On-going   PT LONG TERM GOAL #5   Title Patient will improve lumbar spine AROM to [facility] in all directions within post-op restrictions.   Baseline Improved lumbar extension and side bending assessed; refer to objective measurments for updated values    Time 7   Period Weeks   Status On-going               Plan - 11/24/15 1215    Clinical Impression Statement Today's session focused on continued therex addressing deep abdominal endurance as well as manual treatment and stretches to improved muscle relaxation. Pt expressing increased pain and movement avoidance during abdominal bracing with single knee lift to chest, however pain never reached above 3/10 on VAS. Also presenting with increased muscle spasm along his Rt lumbar  paraspinals and QL. Minimal tolerance to STM, so therapist guided pt through a sidelying QL stretch with improved pain report. Will continue with current POC to address muscle spasm, deep abdominal endurance and lumbar ROM.   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 Continue with IFC e-stim to low back in S/L position to reduce pain and muscle guarding. Progress core stabilization ther ex as tolerated.    PT Home Exercise Plan updated with supine ab set with alt bent knee raise and QL stretch   Consulted and Agree with Plan of Care Patient      Patient will benefit from skilled therapeutic intervention in order to improve the following deficits and impairments:  Abnormal gait, Decreased endurance, Hypomobility, Decreased scar mobility, Decreased strength, Pain, Decreased activity tolerance, Difficulty walking, Improper body mechanics, Decreased range of motion, Impaired flexibility, Postural dysfunction, Decreased mobility  Visit Diagnosis: Right-sided low back pain without sciatica  Muscle weakness (generalized)  Other abnormalities of gait and mobility     Problem List There are no active problems to display for this patient.  12:26 PM,11/24/2015 Elly Modena PT, DPT [facility] [place] [address] [place], [place], [address] Phone: [phone]   Fax:   [phone]  Name: Nikolaj Geraghty. MRN: [id number] Date of Birth: [birth year]

## 2015-11-27 ENCOUNTER — Ambulatory Visit (HOSPITAL_COMMUNITY): Payer: BLUE CROSS/BLUE SHIELD | Admitting: Physical Therapy

## 2015-11-27 DIAGNOSIS — M545 Low back pain, unspecified: Secondary | ICD-10-CM

## 2015-11-27 DIAGNOSIS — R2689 Other abnormalities of gait and mobility: Secondary | ICD-10-CM

## 2015-11-27 DIAGNOSIS — M6281 Muscle weakness (generalized): Secondary | ICD-10-CM

## 2015-11-27 NOTE — Therapy (Signed)
[facility] [address] [place], [place], [address] Phone: [phone]   Fax:  [phone]  Physical Therapy Treatment  Patient Details  Name: Nathan Lambert. MRN: [id number] Date of Birth: [birth year] Referring Provider: Dr. Kary Kos   Encounter Date: 11/27/2015      PT End of Session - 11/27/15 1201    Visit Number 19   Number of Visits 23   Date for PT Re-Evaluation 12/04/15   Authorization Type BCBS    Authorization Time Period 10/23/2015 to 12/04/2015   PT Start Time 1035   PT Stop Time 1115   PT Time Calculation (min) 40 min   Activity Tolerance Patient limited by pain;Patient tolerated treatment well   Behavior During Therapy Anxious;WFL for tasks assessed/performed      Past Medical History  Diagnosis Date  . Allergy 2012    dogs/cats and bactirm    Past Surgical History  Procedure Laterality Date  . Fracture surgery Left     ORIF   . Spine surgery Right 06/16/2015    p lumbar laminectomy and microdisectomy at L5-S1 on the right    There were no vitals filed for this visit.      Subjective Assessment - 11/27/15 1042    Subjective Pt states he was released from his surgeon and going to a different one now.  States he is unsure why he was released.  States he had to resign from his job due to unable to return to that type of work.  Currently with 2/10 pain.    Currently in Pain? Yes   Pain Score 2    Pain Location Back   Pain Orientation Mid   Pain Type Chronic pain                         [facility] Adult PT Treatment/Exercise - 11/27/15 1051    Lumbar Exercises: Stretches   Active Hamstring Stretch 3 reps;30 seconds   Active Hamstring Stretch Limitations 2nd step   Lumbar Exercises: Aerobic   Stationary Bike 5 minutes on level 4 (warmup no charge)   Lumbar Exercises: Standing   Functional Squats 10 reps  3D hip excursions   Lumbar Exercises: Supine   Large Ball Abdominal Isometric  Other (comment)   Large Ball Abdominal Isometric Limitations attempted, however patient stated he couldnt due to pain   Large Ball Oblique Isometric 10 reps   Lumbar Exercises: Quadruped   Single Arm Raise 5 reps   Straight Leg Raise 5 reps                  PT Short Term Goals - 10/23/15 2230    PT SHORT TERM GOAL #1   Title Patient will independently demo full understanding with initial HEP in order to progress towards improved function.    Baseline Pt required vebal cues for proper technique and parameters   Time 2   Period Weeks   Status Achieved   PT SHORT TERM GOAL #2   Title Patient will report decreased R-sided LBP  to 0-5/10 on a VAS in order to improve his ability to sleep with less than 2 interruptions/ night.   Baseline LBP ranges between a 2-8/10 on a VAS   Time 3   Period Weeks   Status Partially Met   PT SHORT TERM GOAL #3   Title Patient will safely demo proper body mechanics with squats and log rolling in order  to reduce stress on the lumbar spine with ADLs and household activities.    Baseline Good technique demo with log rolling technique with no verbal cues required; Improved squatting technique demo, but verbal/tactile cues required to maintain lumbar spine in neutral position    Time 2   Period Weeks   Status Partially Met           PT Long Term Goals - 10/23/15 2232    PT LONG TERM GOAL #1   Title Patient will report decreased back pain to 0-3/10 on a VAS in order to improve outdoor ambulation to >45 minutes.    Baseline R-sded LBP ranges between a 2-8/10 on a VAS; Patient is able to ambulate for 20 minutes.    Time 7   Period Weeks   Status On-going   PT LONG TERM GOAL #2   Title Patient will demo improved LE and core strength to 4+/5 MMT grade in order to improve tolerance with leisure activities.   Baseline Refer to objective measurements for updated MMT   Time 7   Period Weeks   Status On-going   PT LONG TERM GOAL #3   Title Patient  will improve his FOTO to <40% limitation in order to progress towards his PLOF with ambulation, leisure activities, and household chores.    Baseline Not formally assessed this visit   Time 7   Period Weeks   Status On-going   PT LONG TERM GOAL #4   Title Patient will be able to lift 15# from low counter with proper squatting technique in order to progress towards work related activities.    Baseline Not formally assessed this visit   Time 7   Period Weeks   Status On-going   PT LONG TERM GOAL #5   Title Patient will improve lumbar spine AROM to [facility] in all directions within post-op restrictions.   Baseline Improved lumbar extension and side bending assessed; refer to objective measurments for updated values    Time 7   Period Weeks   Status On-going               Plan - 11/27/15 1201    Clinical Impression Statement Continued focus on improving strength and stabiltiy.  PT declined completing any manual on his back today as he states it hurts and gets him flared up.  Pt able to complete quadruped therex, however with voiced discomfort.  Attempted isometric abdominal actvity using physioball for rectus femoris, however patient c/o too much pain and difficulty.  Able to complete actvitiy for oblique muscles.  Pt reports complaince with new stretches added last session.  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 Continue with IFC e-stim to low back in S/L position to reduce pain and muscle guarding. Progress core stabilization ther ex as tolerated.    PT Home Exercise Plan updated with supine ab set with alt bent knee raise and QL stretch   Consulted and Agree with Plan of Care Patient  Patient will benefit from skilled therapeutic intervention in order to improve the following deficits and impairments:  Abnormal gait, Decreased endurance, Hypomobility, Decreased scar mobility, Decreased strength, Pain, Decreased activity tolerance, Difficulty walking, Improper body mechanics, Decreased range of motion, Impaired flexibility, Postural dysfunction, Decreased mobility  Visit Diagnosis: Right-sided low back pain without sciatica  Muscle weakness (generalized)  Other abnormalities of gait and mobility     Problem List There are no active problems to display for this patient.   Teena Irani, PTA/CLT [phone]  11/27/2015, 12:06 PM  [facility] [address] [place], [place], [address] Phone: [phone]   Fax:  [phone]  Name: Con Arganbright. MRN: [id number] Date of Birth: [birth year]

## 2015-12-02 ENCOUNTER — Ambulatory Visit (HOSPITAL_COMMUNITY): Payer: BLUE CROSS/BLUE SHIELD

## 2015-12-02 DIAGNOSIS — M545 Low back pain, unspecified: Secondary | ICD-10-CM

## 2015-12-02 DIAGNOSIS — M5126 Other intervertebral disc displacement, lumbar region: Secondary | ICD-10-CM

## 2015-12-02 DIAGNOSIS — R269 Unspecified abnormalities of gait and mobility: Secondary | ICD-10-CM

## 2015-12-02 DIAGNOSIS — M2560 Stiffness of unspecified joint, not elsewhere classified: Secondary | ICD-10-CM

## 2015-12-02 DIAGNOSIS — M6281 Muscle weakness (generalized): Secondary | ICD-10-CM

## 2015-12-02 DIAGNOSIS — Z9889 Other specified postprocedural states: Secondary | ICD-10-CM

## 2015-12-02 DIAGNOSIS — G8929 Other chronic pain: Secondary | ICD-10-CM

## 2015-12-02 DIAGNOSIS — R2689 Other abnormalities of gait and mobility: Secondary | ICD-10-CM

## 2015-12-02 NOTE — Therapy (Signed)
[facility] [address] [place], [place], [address] Phone: [phone]   Fax:  [phone]  Physical Therapy Treatment  Patient Details  Name: Nathan Lambert. MRN: [id number] Date of Birth: [birth year] Referring Provider: Dr Kary Kos   Encounter Date: 12/02/2015      PT End of Session - 12/02/15 1235    Visit Number 20   Number of Visits 23   Date for PT Re-Evaluation 12/04/15   Authorization Type BCBS- reassessment done on 12/02/15    Authorization Time Period 10/23/2015 to 12/04/2015; --> 12/03/15 to 01/03/16    PT Start Time 1119   PT Stop Time 1212   PT Time Calculation (min) 53 min   Activity Tolerance Patient limited by pain;Patient tolerated treatment well   Behavior During Therapy Anxious;WFL for tasks assessed/performed      Past Medical History  Diagnosis Date  . Allergy 2012    dogs/cats and bactirm    Past Surgical History  Procedure Laterality Date  . Fracture surgery Left     ORIF   . Spine surgery Right 06/16/2015    p lumbar laminectomy and microdisectomy at L5-S1 on the right    There were no vitals filed for this visit.      Subjective Assessment - 12/02/15 1122    Subjective Pt reports he has been working on new HEP activities, stretches, and flexion, but he is improving and like them.    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 No limited    How long can you stand comfortably? 10-15 minutes then achiness in low back    How long can you walk comfortably? 20-30 min, mildly improved   Diagnostic tests MRI= herniated NP at L5-S1   Patient Stated Goals Pt's goal is to reduce his LBP, return to sports related activities, return to work, and to be able to play with his son.    Currently in Pain? Yes   Pain Score 2    Pain Location Back   Pain Orientation Mid   Pain Descriptors / Indicators Aching   Pain Type Chronic  pain   Pain Onset More than a month ago   Pain Frequency Constant   Aggravating Factors  sneezing, or sudden movements.    Pain Relieving Factors pain meds, lying flat on back with feet on floor.    Multiple Pain Sites No            [facility] PT Assessment - 12/02/15 0001    Assessment   Medical Diagnosis Lumbago   Referring Provider Dr Kary Kos    Onset Date/Surgical Date 06/16/15   Hand Dominance Right   Next MD Visit Has been relased  referred to Glenna Fellows in [place] and waiting to hear back.    Prior Therapy PT with different clinic prior to his recent surgery    Precautions   Precautions Back  DC with recs to not lift over 10lbs, and to 'be careful'   Balance Screen   Has the patient fallen in the past 6 months No   Has the patient had a decrease in activity level because of a fear of falling?  No   Is the patient reluctant to leave their home because of a fear of falling?  No  increased since 1MA, still below baseline.    Strength   Right Hip Flexion 4+/5   Right Hip  Extension 3/5   Right Hip External Rotation  5/5   Right Hip Internal Rotation 5/5   Right Hip ABduction 4/5   Left Hip Flexion 4+/5   Left Hip Extension 4/5   Left Hip ABduction 5/5   Right Knee Flexion 4+/5   Right Knee Extension 5/5   Left Knee Flexion 5/5   Left Knee Extension 5/5   Right Ankle Dorsiflexion 5/5   Left Ankle Dorsiflexion 5/5      Therapeutic Exercise" 1. Seated Transverse Abdominus activation paired with belly breathing 1x15.  2. QL Stretch 3x45sec Reviewed from HEP 3. Seated Trunk Rotation stretch 3x30sec bilat 4. Standing  To/from prone on pillows (4 minutes to perform)          [facility] Adult PT Treatment/Exercise - 12/02/15 0001    Manual Therapy   Manual Therapy Myofascial release  12 minutes   Myofascial Release R quadratus lumborum, iliocostalis lumborum           PT Education - 12/02/15 1234    Education provided Yes   Education Details continued to espouse  return of normalized movement in the setting of chronic pain and allodynia. He is agreeable and willing to continue to try.    Person(s) Educated Patient   Methods Explanation;Demonstration   Comprehension Verbalized understanding;Returned demonstration          PT Short Term Goals - 12/02/15 1432    PT SHORT TERM GOAL #1   Title Patient will independently demo full understanding with initial HEP in order to progress towards improved function.    Baseline Pt required vebal cues for proper technique and parameters   Time 2   Period Weeks   Status Achieved   PT SHORT TERM GOAL #2   Title Patient will report decreased R-sided LBP  to 0-5/10 on a VAS in order to improve his ability to sleep with less than 2 interruptions/ night.   Baseline LBP ranges between a 2-8/10 on a VAS   Time 3   Period Weeks   Status Partially Met   PT SHORT TERM GOAL #3   Title Patient will safely demo proper body mechanics with squats and log rolling in order to reduce stress on the lumbar spine with ADLs and household activities.    Baseline Good technique demo with log rolling technique with no verbal cues required; Improved squatting technique demo, but verbal/tactile cues required to maintain lumbar spine in neutral position    Time 2   Period Weeks   Status Partially Met           PT Long Term Goals - 12/02/15 1433    PT LONG TERM GOAL #1   Title Patient will report decreased back pain to 0-3/10 on a VAS in order to improve outdoor ambulation to >45 minutes.    Baseline Time has improved to 25-30 minutes, still limited by pain.    Time 7   Period Weeks   Status Partially Met   PT LONG TERM GOAL #2   Title Patient will demo improved LE and core strength to 4+/5 MMT grade in order to improve tolerance with leisure activities.   Baseline Core activation continues to present with altered firing patterns and poor quality.    Time 7   Period Weeks   Status On-going   PT LONG TERM GOAL #4   Title  Patient will be able to lift 15# from low counter with proper squatting technique in order to progress towards work related  activities.    Baseline Referring provider has restricted him 10# only.    Time 7   Period Weeks   Status Deferred   PT LONG TERM GOAL #5   Title Patient will improve lumbar spine AROM to [facility] in all directions within post-op restrictions.   Baseline Mobility is improving but largely dictated by spastic R lumbar musculature and still very allodynic.    Time 7   Period Weeks   Status Partially Met               Plan - 12/02/15 1242    Clinical Impression Statement Reassessment performed today: pt continues to show improvement in some areas of isolated strength. The patient demonstrates decreased firing of lumbar paraspinals during functional movement, with heightened tonicity and allodynia at the R lumbar iliocostalis and quadratus. Central achiness at the surgical site remains constant but minimally concerning for the patient. Myofascial release was performed to release portions of lumbar muscles, which were difficult to tolerate but provided some relief  thereafter. Transverse Abdonimus activation was reviewed in full to correction current stabilization technique that was too dominant in superfiscial abdominals and too little activation of the deep core stabilization.  Pt is making some progress toward functional  goals. The patient will benefit from contiued PT services to address the current limitations and restrictions to imporve functional mobility, tolerance to IADL, and tolerance to household and work rolels.    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 May change depending of recs from new consult.    PT Treatment/Interventions Electrical Stimulation;Moist Heat;Gait training;Functional  mobility training;Therapeutic activities;Therapeutic exercise;Manual techniques;Patient/family education;Scar mobilization;Passive range of motion;Taping   PT Next Visit Plan Review transverse abdominus contractions with breathing and isolate from breathing if ready; MFR to R lilocostalis lumborum and quadratus lumborum, Progress to biocuff core training with bent knee drop.    PT Home Exercise Plan Review QL stretch added 2 sessions ago.    Consulted and Agree with Plan of Care Patient      Patient will benefit from skilled therapeutic intervention in order to improve the following deficits and impairments:  Abnormal gait, Decreased endurance, Hypomobility, Decreased scar mobility, Decreased strength, Pain, Decreased activity tolerance, Difficulty walking, Improper body mechanics, Decreased range of motion, Impaired flexibility, Postural dysfunction, Decreased mobility  Visit Diagnosis: Right-sided low back pain without sciatica - Plan: PT plan of care cert/re-cert  Muscle weakness (generalized) - Plan: PT plan of care cert/re-cert  S/P lumbar laminectomy - Plan: PT plan of care cert/re-cert  Other abnormalities of gait and mobility - Plan: PT plan of care cert/re-cert  Disc displacement, lumbar - Plan: PT plan of care cert/re-cert  Chronic right-sided low back pain without sciatica - Plan: PT plan of care cert/re-cert  Muscle weakness - Plan: PT plan of care cert/re-cert  Joint stiffness of spine - Plan: PT plan of care cert/re-cert  Abnormality of gait - Plan: PT plan of care cert/re-cert     Problem List There are no active problems to display for this patient.   2:43 PM, 12/02/2015 Etta Grandchild, PT, DPT PRN [facility] # [id number] [phone] (office)     [facility] [place], [place], [address] Phone: [phone]   Fax:  [phone]  Name: Nathan Lambert. MRN:  [id number] Date of Birth: 1980/08/22

## 2015-12-04 ENCOUNTER — Ambulatory Visit (HOSPITAL_COMMUNITY): Payer: BLUE CROSS/BLUE SHIELD

## 2015-12-04 DIAGNOSIS — M2560 Stiffness of unspecified joint, not elsewhere classified: Secondary | ICD-10-CM

## 2015-12-04 DIAGNOSIS — G8929 Other chronic pain: Secondary | ICD-10-CM

## 2015-12-04 DIAGNOSIS — M545 Low back pain, unspecified: Secondary | ICD-10-CM

## 2015-12-04 DIAGNOSIS — M5126 Other intervertebral disc displacement, lumbar region: Secondary | ICD-10-CM

## 2015-12-04 DIAGNOSIS — M6281 Muscle weakness (generalized): Secondary | ICD-10-CM

## 2015-12-04 DIAGNOSIS — Z9889 Other specified postprocedural states: Secondary | ICD-10-CM

## 2015-12-04 DIAGNOSIS — R2689 Other abnormalities of gait and mobility: Secondary | ICD-10-CM

## 2015-12-04 DIAGNOSIS — R269 Unspecified abnormalities of gait and mobility: Secondary | ICD-10-CM

## 2015-12-04 NOTE — Therapy (Signed)
[facility] [address] [place], [place], [address] Phone: [phone]   Fax:  [phone]  Physical Therapy Treatment  Patient Details  Name: Nathan Lambert. MRN: [id number] Date of Birth: 07-26-1980 Referring Provider: Dr Kary Kos   Encounter Date: 12/04/2015      PT End of Session - 12/04/15 2201    Visit Number 21   Number of Visits 31   Date for PT Re-Evaluation 01/02/16   Authorization Type [employer]- reassessment done on 12/02/15    Authorization Time Period 10/23/2015 to 12/04/2015; --> 12/03/15 to 01/03/16    PT Start Time 0915   PT Stop Time 0949   PT Time Calculation (min) 34 min   Activity Tolerance Patient tolerated treatment well;Patient limited by pain   Behavior During Therapy [facility] for tasks assessed/performed      Past Medical History  Diagnosis Date  . Allergy 2012    dogs/cats and bactirm    Past Surgical History  Procedure Laterality Date  . Fracture surgery Left     ORIF   . Spine surgery Right 06/16/2015    p lumbar laminectomy and microdisectomy at L5-S1 on the right    There were no vitals filed for this visit.      Subjective Assessment - 12/04/15 0925    Subjective Pt has been busy attending childrens baseball and T-ball games, hence has had to stand for prolonged periods out of necessity, and has been doing the best he can. He received the instrucitonal videos on three-part-breath  and transverse abdominal activation and has been practicing those at home in supine.    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 No limited    How long can you stand comfortably? 10-15 minutes then achiness in low back    How long can you walk comfortably? 20-30 min, mildly improved   Diagnostic tests MRI= herniated NP at L5-S1   Patient Stated Goals Pt's goal is to reduce his LBP, return to sports related activities, return to work, and  to be able to play with his son.    Currently in Pain? Yes   Pain Score 2    Pain Location --  Left lateral spinal stabilizers and central low back near surgical site.    Pain Orientation Left   Pain Descriptors / Indicators Aching   Pain Type Chronic pain   Pain Onset More than a month ago                         [facility] Adult PT Treatment/Exercise - 12/04/15 0001    Lumbar Exercises: Supine   Ab Set 10 reps;3 seconds  transverse contractions pauired c 3-part breath   Glut Set 10 reps;3 seconds  bridge inititation preceded by Transverse Abdominus activati   Moist Heat Therapy   Number Minutes Moist Heat 15 Minutes  lumbar hot pack positioned during all supne exercises.    Manual Therapy   Manual Therapy Myofascial release  8 minutes   Joint Mobilization Lumbar Spine Lateral Joint Glides  3x60sec, hooklying with PT supporting hips.    Myofascial Release R quadratus lumborum, iliocostalis lumborum                  PT Short Term Goals - 12/02/15 1432    PT SHORT TERM GOAL #1   Title Patient will independently demo full  understanding with initial HEP in order to progress towards improved function.    Baseline Pt required vebal cues for proper technique and parameters   Time 2   Period Weeks   Status Achieved   PT SHORT TERM GOAL #2   Title Patient will report decreased R-sided LBP  to 0-5/10 on a VAS in order to improve his ability to sleep with less than 2 interruptions/ night.   Baseline LBP ranges between a 2-8/10 on a VAS   Time 3   Period Weeks   Status Partially Met   PT SHORT TERM GOAL #3   Title Patient will safely demo proper body mechanics with squats and log rolling in order to reduce stress on the lumbar spine with ADLs and household activities.    Baseline Good technique demo with log rolling technique with no verbal cues required; Improved squatting technique demo, but verbal/tactile cues required to maintain lumbar spine in neutral  position    Time 2   Period Weeks   Status Partially Met           PT Long Term Goals - 12/02/15 1433    PT LONG TERM GOAL #1   Title Patient will report decreased back pain to 0-3/10 on a VAS in order to improve outdoor ambulation to >45 minutes.    Baseline Time has improved to 25-30 minutes, still limited by pain.    Time 7   Period Weeks   Status Partially Met   PT LONG TERM GOAL #2   Title Patient will demo improved LE and core strength to 4+/5 MMT grade in order to improve tolerance with leisure activities.   Baseline Core activation continues to present with altered firing patterns and poor quality.    Time 7   Period Weeks   Status On-going   PT LONG TERM GOAL #4   Title Patient will be able to lift 15# from low counter with proper squatting technique in order to progress towards work related activities.    Baseline Referring provider has restricted him 10# only.    Time 7   Period Weeks   Status Deferred   PT LONG TERM GOAL #5   Title Patient will improve lumbar spine AROM to WFL in all directions within post-op restrictions.   Baseline Mobility is improving but largely dictated by spastic R lumbar musculature and still very allodynic.    Time 7   Period Weeks   Status Partially Met               Plan - 12/04/15 2203    Clinical Impression Statement Pt showing some progress toward his treatment goals this session. He has improved in his ability to isolate the deep abdominal stabilizers, but they continue to fatigue quickly. He was also able to activate lumbar multifidus effectively today for th efirst time today, which was then paired with new advanced core techniques. The pt requires additional time to rest between biuts duue to fatigue and pain, but completes session as planned. The pt tolerates manual release of lumbar musculature today much more easily than previous session revealing some pain associated with pyomobility of the lumbar spine, which was  responded well to mobilization. Will continue with POC as previously laid out at eval/reassessment.    Rehab Potential Good   Clinical Impairments Affecting Rehab Potential hx of lumbar disc displacement due to work related injury, reports this still has not been resolved resulitng in intermittent shooting pain on the Right side     PT Frequency 2x / week   PT Duration 4 weeks   PT Treatment/Interventions Electrical Stimulation;Moist Heat;Gait training;Functional mobility training;Therapeutic activities;Therapeutic exercise;Manual techniques;Patient/family education;Scar mobilization;Passive range of motion;Taping   PT Next Visit Plan Progress transverse abdominus contractions to 5x8 seconds with breathing and isolate from breathing if ready; MFR to R lilocostalis lumborum and quadratus lumborum, Progress to biocuff core training with bent knee drop.    Consulted and Agree with Plan of Care Patient      Patient will benefit from skilled therapeutic intervention in order to improve the following deficits and impairments:  Abnormal gait, Decreased endurance, Hypomobility, Decreased scar mobility, Decreased strength, Pain, Decreased activity tolerance, Difficulty walking, Improper body mechanics, Decreased range of motion, Impaired flexibility, Postural dysfunction, Decreased mobility  Visit Diagnosis: Right-sided low back pain without sciatica  Muscle weakness (generalized)  S/P lumbar laminectomy  Other abnormalities of gait and mobility  Disc displacement, lumbar  Chronic right-sided low back pain without sciatica  Muscle weakness  Joint stiffness of spine  Abnormality of gait     Problem List There are no active problems to display for this patient.   10:14 PM, 12/04/2015 Allan C Buccola, PT, DPT PRN Physical Therapist at [facility] [place] License # [id number] [phone] (office)    [facility] [address] [place], [place],  [address] Phone: [phone]   Fax:  [phone]  Name: Reyce L Schnyder Jr. MRN: [id number] Date of Birth: 07/01/1981     

## 2015-12-08 ENCOUNTER — Ambulatory Visit (HOSPITAL_COMMUNITY): Payer: BLUE CROSS/BLUE SHIELD | Admitting: Physical Therapy

## 2015-12-09 ENCOUNTER — Telehealth (HOSPITAL_COMMUNITY): Payer: Self-pay

## 2015-12-09 ENCOUNTER — Encounter (HOSPITAL_COMMUNITY): Payer: BLUE CROSS/BLUE SHIELD

## 2015-12-09 NOTE — Telephone Encounter (Signed)
12/09/15 pt just called to cx - said he just needed to cancel today

## 2015-12-11 ENCOUNTER — Ambulatory Visit (HOSPITAL_COMMUNITY): Payer: BLUE CROSS/BLUE SHIELD

## 2015-12-11 ENCOUNTER — Telehealth (HOSPITAL_COMMUNITY): Payer: Self-pay

## 2015-12-11 NOTE — Telephone Encounter (Signed)
12/11/15 he cx and we rescheduled the appt

## 2015-12-16 ENCOUNTER — Ambulatory Visit (HOSPITAL_COMMUNITY): Payer: BLUE CROSS/BLUE SHIELD

## 2015-12-16 DIAGNOSIS — R269 Unspecified abnormalities of gait and mobility: Secondary | ICD-10-CM

## 2015-12-16 DIAGNOSIS — M6281 Muscle weakness (generalized): Secondary | ICD-10-CM

## 2015-12-16 DIAGNOSIS — M2560 Stiffness of unspecified joint, not elsewhere classified: Secondary | ICD-10-CM

## 2015-12-16 DIAGNOSIS — R2689 Other abnormalities of gait and mobility: Secondary | ICD-10-CM

## 2015-12-16 DIAGNOSIS — M545 Low back pain, unspecified: Secondary | ICD-10-CM

## 2015-12-16 DIAGNOSIS — G8929 Other chronic pain: Secondary | ICD-10-CM

## 2015-12-16 DIAGNOSIS — M5126 Other intervertebral disc displacement, lumbar region: Secondary | ICD-10-CM

## 2015-12-16 NOTE — Patient Instructions (Signed)
Asked pt to walk 1x daily, at a comfortable pace.

## 2015-12-16 NOTE — Therapy (Signed)
[facility] [address] [place], [place], [address] Phone: [phone]   Fax:  [phone]  Physical Therapy Treatment  Patient Details  Name: Nathan Lambert. MRN: [id number] Date of Birth: Nov 22, 1980 Referring Provider: Dr Kary Kos   Encounter Date: 12/16/2015      PT End of Session - 12/16/15 1104    Visit Number 22   Number of Visits 31   Date for PT Re-Evaluation 01/02/16   Authorization Type [employer]- reassessment done on 12/02/15    Authorization Time Period 10/23/2015 to 12/04/2015; --> 12/03/15 to 01/03/16    PT Start Time 1045   PT Stop Time 1115   PT Time Calculation (min) 30 min   Activity Tolerance Patient limited by pain;Patient limited by fatigue;Patient tolerated treatment well   Behavior During Therapy [facility] for tasks assessed/performed      Past Medical History  Diagnosis Date  . Allergy 2012    dogs/cats and bactirm    Past Surgical History  Procedure Laterality Date  . Fracture surgery Left     ORIF   . Spine surgery Right 06/16/2015    p lumbar laminectomy and microdisectomy at L5-S1 on the right    There were no vitals filed for this visit.      Subjective Assessment - 12/16/15 1047    Subjective Pt reports he has been doing ok. He canceled last two sessions due to a fall in the shower at home, wherein he was feeling very sore, but he thinks he has bounced back since then. He has been walking to his pond with his son on occasion.     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 Went to several cookouts, total standing time talking to guests ~30 minutes   How long can you walk comfortably? 20-30 min, about the same since 5/18   Diagnostic tests MRI= herniated NP at L5-S1   Patient Stated Goals Pt's goal is to reduce his LBP, return to sports related activities, return to work, and to be able to play with his son.    Currently in Pain? Yes   Pain Score 2   Reports it to be better than ususal, no sudden sharp pains.    Pain Location Back  R low back   Pain Orientation Right   Pain Descriptors / Indicators Aching   Pain Type Chronic pain   Pain Onset More than a month ago                         [facility] Adult PT Treatment/Exercise - 12/16/15 0001    Lumbar Exercises: Standing   Other Standing Lumbar Exercises Normal stance alt rotation c physio ball  10x bilat   Lumbar Exercises: Prone   Other Prone Lumbar Exercises Quadruped Firehydrants   Lumbar Exercises: Quadruped   Single Arm Raise 10 reps;Right;Left  10x bilat alteranting, c TrAbd set preceedig   Straight Leg Raise 10 reps   Manual Therapy   Joint Mobilization Lumbar Spine in 4-point during exercises for help with pain  10 minutes                  PT Short Term Goals - 12/02/15 1432    PT SHORT TERM GOAL #1   Title Patient will independently demo full understanding with initial HEP in order to progress towards improved function.  Baseline Pt required vebal cues for proper technique and parameters   Time 2   Period Weeks   Status Achieved   PT SHORT TERM GOAL #2   Title Patient will report decreased R-sided LBP  to 0-5/10 on a VAS in order to improve his ability to sleep with less than 2 interruptions/ night.   Baseline LBP ranges between a 2-8/10 on a VAS   Time 3   Period Weeks   Status Partially Met   PT SHORT TERM GOAL #3   Title Patient will safely demo proper body mechanics with squats and log rolling in order to reduce stress on the lumbar spine with ADLs and household activities.    Baseline Good technique demo with log rolling technique with no verbal cues required; Improved squatting technique demo, but verbal/tactile cues required to maintain lumbar spine in neutral position    Time 2   Period Weeks   Status Partially Met           PT Long Term Goals - 12/02/15 1433    PT LONG TERM GOAL  #1   Title Patient will report decreased back pain to 0-3/10 on a VAS in order to improve outdoor ambulation to >45 minutes.    Baseline Time has improved to 25-30 minutes, still limited by pain.    Time 7   Period Weeks   Status Partially Met   PT LONG TERM GOAL #2   Title Patient will demo improved LE and core strength to 4+/5 MMT grade in order to improve tolerance with leisure activities.   Baseline Core activation continues to present with altered firing patterns and poor quality.    Time 7   Period Weeks   Status On-going   PT LONG TERM GOAL #4   Title Patient will be able to lift 15# from low counter with proper squatting technique in order to progress towards work related activities.    Baseline Referring provider has restricted him 10# only.    Time 7   Period Weeks   Status Deferred   PT LONG TERM GOAL #5   Title Patient will improve lumbar spine AROM to [facility] in all directions within post-op restrictions.   Baseline Mobility is improving but largely dictated by spastic R lumbar musculature and still very allodynic.    Time 7   Period Weeks   Status Partially Met               Plan - 12/16/15 1107    Clinical Impression Statement Pt makig some progress toward goals today. Session focused on pairing newly acquiried isolated deep ab set with quadruped activities. Pt demonstrates high level sof fatigue, taking standing breaks regularly, and performng trunk rotation AROM for pain relief. Activitiy tolerance is noted to be improved since previous session, able to perform more exercises with less bradykinesia.    Rehab Potential Good   Clinical Impairments Affecting Rehab Potential hx of lumbar disc displacement due to work related injury, reports this still has not been resolved resulitng in intermittent shooting pain on the Right side   PT Frequency 2x / week   PT Duration 4 weeks   PT Treatment/Interventions Electrical Stimulation;Moist Heat;Gait training;Functional  mobility training;Therapeutic activities;Therapeutic exercise;Manual techniques;Patient/family education;Scar mobilization;Passive range of motion;Taping   PT Next Visit Plan Continue with quadruped activities, and monitor endurance. with activity.    Consulted and Agree with Plan of Care Patient      Patient will benefit from skilled therapeutic intervention in  order to improve the following deficits and impairments:  Abnormal gait, Decreased endurance, Hypomobility, Decreased scar mobility, Decreased strength, Pain, Decreased activity tolerance, Difficulty walking, Improper body mechanics, Decreased range of motion, Impaired flexibility, Postural dysfunction, Decreased mobility  Visit Diagnosis: Right-sided low back pain without sciatica  Muscle weakness (generalized)  Other abnormalities of gait and mobility  Disc displacement, lumbar  Chronic right-sided low back pain without sciatica  Muscle weakness  Joint stiffness of spine  Abnormality of gait     Problem List There are no active problems to display for this patient.   11:18 AM, 12/16/2015 Etta Grandchild, PT, DPT PRN Physical Therapist at [facility] # [id number] [phone] (office)      [facility] [address] [place], [place], [address] Phone: [phone]   Fax:  [phone]  Name: Nathan Lambert. MRN: [id number] Date of Birth: October 31, 1980

## 2015-12-22 ENCOUNTER — Ambulatory Visit (HOSPITAL_COMMUNITY): Payer: BLUE CROSS/BLUE SHIELD | Attending: Neurosurgery | Admitting: Physical Therapy

## 2015-12-22 DIAGNOSIS — M6281 Muscle weakness (generalized): Secondary | ICD-10-CM | POA: Diagnosis present

## 2015-12-22 DIAGNOSIS — R2689 Other abnormalities of gait and mobility: Secondary | ICD-10-CM

## 2015-12-22 DIAGNOSIS — M545 Low back pain, unspecified: Secondary | ICD-10-CM

## 2015-12-22 DIAGNOSIS — M2560 Stiffness of unspecified joint, not elsewhere classified: Secondary | ICD-10-CM | POA: Insufficient documentation

## 2015-12-22 DIAGNOSIS — M5126 Other intervertebral disc displacement, lumbar region: Secondary | ICD-10-CM | POA: Insufficient documentation

## 2015-12-22 DIAGNOSIS — R269 Unspecified abnormalities of gait and mobility: Secondary | ICD-10-CM | POA: Insufficient documentation

## 2015-12-22 DIAGNOSIS — G8929 Other chronic pain: Secondary | ICD-10-CM | POA: Insufficient documentation

## 2015-12-22 DIAGNOSIS — Z9889 Other specified postprocedural states: Secondary | ICD-10-CM | POA: Insufficient documentation

## 2015-12-22 NOTE — Therapy (Signed)
[facility] [address] [place], [place], [address] Phone: [phone]   Fax:  [phone]  Physical Therapy Treatment  Patient Details  Name: Nathan Lambert. MRN: [id number] Date of Birth: [birth year] Referring Provider: Dr Kary Kos   Encounter Date: 12/22/2015      PT End of Session - 12/22/15 1357    Visit Number 23   Number of Visits 31   Date for PT Re-Evaluation 01/02/16   Authorization Type [employer]- reassessment done on 12/02/15    Authorization Time Period 10/23/2015 to 12/04/2015; --> 12/03/15 to 01/03/16    PT Start Time 1120   PT Stop Time 1200   PT Time Calculation (min) 40 min   Activity Tolerance Patient limited by pain;Patient limited by fatigue;Patient tolerated treatment well   Behavior During Therapy [facility] for tasks assessed/performed      Past Medical History  Diagnosis Date  . Allergy 2012    dogs/cats and bactirm    Past Surgical History  Procedure Laterality Date  . Fracture surgery Left     ORIF   . Spine surgery Right 06/16/2015    p lumbar laminectomy and microdisectomy at L5-S1 on the right    There were no vitals filed for this visit.      Subjective Assessment - 12/22/15 1357    Subjective PT states he went to [place] on Thursday and he changed his oxycodone to gabapentin to help with the nerve pain.  States he can't tell much of a differnce yet but just started yesterday.  2/10 aching pain in central low back.                          [facility] Adult PT Treatment/Exercise - 12/22/15 1131    Lumbar Exercises: Stretches   Active Hamstring Stretch 3 reps;30 seconds   Active Hamstring Stretch Limitations 12" step   Lumbar Exercises: Supine   Large Ball Abdominal Isometric 10 reps   Large Ball Oblique Isometric 10 reps   Lumbar Exercises: Prone   Other Prone Lumbar Exercises Quadruped Firehydrants   Lumbar Exercises: Quadruped   Single Arm Raise 10 reps;Right;Left   Straight Leg  Raise 10 reps   Opposite Arm/Leg Raise 10 reps                  PT Short Term Goals - 12/02/15 1432    PT SHORT TERM GOAL #1   Title Patient will independently demo full understanding with initial HEP in order to progress towards improved function.    Baseline Pt required vebal cues for proper technique and parameters   Time 2   Period Weeks   Status Achieved   PT SHORT TERM GOAL #2   Title Patient will report decreased R-sided LBP  to 0-5/10 on a VAS in order to improve his ability to sleep with less than 2 interruptions/ night.   Baseline LBP ranges between a 2-8/10 on a VAS   Time 3   Period Weeks   Status Partially Met   PT SHORT TERM GOAL #3   Title Patient will safely demo proper body mechanics with squats and log rolling in order to reduce stress on the lumbar spine with ADLs and household activities.    Baseline Good technique demo with log rolling technique with no verbal cues required; Improved squatting technique demo, but verbal/tactile cues required to maintain lumbar spine in neutral position    Time 2  Period Weeks   Status Partially Met           PT Long Term Goals - 12/02/15 1433    PT LONG TERM GOAL #1   Title Patient will report decreased back pain to 0-3/10 on a VAS in order to improve outdoor ambulation to >45 minutes.    Baseline Time has improved to 25-30 minutes, still limited by pain.    Time 7   Period Weeks   Status Partially Met   PT LONG TERM GOAL #2   Title Patient will demo improved LE and core strength to 4+/5 MMT grade in order to improve tolerance with leisure activities.   Baseline Core activation continues to present with altered firing patterns and poor quality.    Time 7   Period Weeks   Status On-going   PT LONG TERM GOAL #4   Title Patient will be able to lift 15# from low counter with proper squatting technique in order to progress towards work related activities.    Baseline Referring provider has restricted him 10#  only.    Time 7   Period Weeks   Status Deferred   PT LONG TERM GOAL #5   Title Patient will improve lumbar spine AROM to [facility] in all directions within post-op restrictions.   Baseline Mobility is improving but largely dictated by spastic R lumbar musculature and still very allodynic.    Time 7   Period Weeks   Status Partially Met               Plan - 12/22/15 1358    Clinical Impression Statement Continued progression with core stability and functional strengthening/stretching.  Pt requires extended time to complete exercises and required rest after each set of 5 reps with each activity.  Able to complete rectus abdominus strengthening with physioball today without pain as had in previous weeks.  Pt did not display pain behaviors of show distress during session today.  Also able to increase reps today with quadruped and add opposite UE/LE to POC.    Rehab Potential Good   Clinical Impairments Affecting Rehab Potential hx of lumbar disc displacement due to work related injury, reports this still has not been resolved resulitng in intermittent shooting pain on the Right side   PT Frequency 2x / week   PT Duration 4 weeks   PT Treatment/Interventions Electrical Stimulation;Moist Heat;Gait training;Functional mobility training;Therapeutic activities;Therapeutic exercise;Manual techniques;Patient/family education;Scar mobilization;Passive range of motion;Taping   PT Next Visit Plan Continue with quadruped activities, and monitor endurance with activity.    Consulted and Agree with Plan of Care Patient      Patient will benefit from skilled therapeutic intervention in order to improve the following deficits and impairments:  Abnormal gait, Decreased endurance, Hypomobility, Decreased scar mobility, Decreased strength, Pain, Decreased activity tolerance, Difficulty walking, Improper body mechanics, Decreased range of motion, Impaired flexibility, Postural dysfunction, Decreased  mobility  Visit Diagnosis: Right-sided low back pain without sciatica  Muscle weakness (generalized)  Other abnormalities of gait and mobility     Problem List There are no active problems to display for this patient.   Teena Irani, PTA/CLT [phone] 12/22/2015, 2:12 PM  [facility] [address] [place], [place], [address] Phone: [phone]   Fax:  [phone]  Name: Nathan Lambert. MRN: [id number] Date of Birth: 13-Mar-1981

## 2015-12-24 ENCOUNTER — Ambulatory Visit (HOSPITAL_COMMUNITY): Payer: BLUE CROSS/BLUE SHIELD

## 2015-12-29 ENCOUNTER — Ambulatory Visit (HOSPITAL_COMMUNITY): Payer: BLUE CROSS/BLUE SHIELD | Admitting: Physical Therapy

## 2015-12-29 DIAGNOSIS — R269 Unspecified abnormalities of gait and mobility: Secondary | ICD-10-CM

## 2015-12-29 DIAGNOSIS — Z9889 Other specified postprocedural states: Secondary | ICD-10-CM

## 2015-12-29 DIAGNOSIS — G8929 Other chronic pain: Secondary | ICD-10-CM

## 2015-12-29 DIAGNOSIS — M545 Low back pain, unspecified: Secondary | ICD-10-CM

## 2015-12-29 DIAGNOSIS — R2689 Other abnormalities of gait and mobility: Secondary | ICD-10-CM

## 2015-12-29 DIAGNOSIS — M6281 Muscle weakness (generalized): Secondary | ICD-10-CM

## 2015-12-29 DIAGNOSIS — M2560 Stiffness of unspecified joint, not elsewhere classified: Secondary | ICD-10-CM

## 2015-12-29 DIAGNOSIS — M5126 Other intervertebral disc displacement, lumbar region: Secondary | ICD-10-CM

## 2015-12-29 NOTE — Therapy (Signed)
[facility] [address] [place], [place], [address] Phone: [phone]   Fax:  [phone]  Physical Therapy Treatment  Patient Details  Name: Nathan Lambert. MRN: [id number] Date of Birth: 1980/09/24 Referring Provider: Dr Kary Kos   Encounter Date: 12/29/2015      PT End of Session - 12/29/15 1009    Visit Number 24   Number of Visits 31   Date for PT Re-Evaluation 01/02/16   Authorization Type [employer]- reassessment done on 12/02/15    Authorization Time Period 10/23/2015 to 12/04/2015; --> 12/03/15 to 01/03/16    PT Start Time 0905   PT Stop Time 0945   PT Time Calculation (min) 40 min   Activity Tolerance Patient limited by pain;Patient limited by fatigue;Patient tolerated treatment well   Behavior During Therapy [facility] for tasks assessed/performed      Past Medical History  Diagnosis Date  . Allergy 2012    dogs/cats and bactirm    Past Surgical History  Procedure Laterality Date  . Fracture surgery Left     ORIF   . Spine surgery Right 06/16/2015    p lumbar laminectomy and microdisectomy at L5-S1 on the right    There were no vitals filed for this visit.      Subjective Assessment - 12/29/15 1013    Subjective PT states he is still having the 2/10 dull ache in his central LB.  Pt states he grilled out with friends over the weekend without any distress or limitations.    Currently in Pain? Yes   Pain Score 2    Pain Location Back   Pain Orientation Mid;Lower   Pain Descriptors / Indicators Aching                         [facility] Adult PT Treatment/Exercise - 12/29/15 0001    Lumbar Exercises: Stretches   Active Hamstring Stretch 3 reps;30 seconds   Active Hamstring Stretch Limitations 12" step   Lumbar Exercises: Standing   Functional Squats 10 reps   Forward Lunge 10 reps   Forward Lunge Limitations 4" box   Side Lunge 10 reps  4" box   Lumbar Exercises: Supine   Ab Set 10 reps;3 seconds    Glut Set 10 reps;3 seconds   Large Ball Abdominal Isometric 10 reps   Large Ball Oblique Isometric 10 reps   Lumbar Exercises: Quadruped   Single Arm Raise 10 reps;Right;Left   Straight Leg Raise 10 reps   Opposite Arm/Leg Raise 10 reps                  PT Short Term Goals - 12/02/15 1432    PT SHORT TERM GOAL #1   Title Patient will independently demo full understanding with initial HEP in order to progress towards improved function.    Baseline Pt required vebal cues for proper technique and parameters   Time 2   Period Weeks   Status Achieved   PT SHORT TERM GOAL #2   Title Patient will report decreased R-sided LBP  to 0-5/10 on a VAS in order to improve his ability to sleep with less than 2 interruptions/ night.   Baseline LBP ranges between a 2-8/10 on a VAS   Time 3   Period Weeks   Status Partially Met   PT SHORT TERM GOAL #3   Title Patient will safely demo proper body mechanics with squats and log rolling in  order to reduce stress on the lumbar spine with ADLs and household activities.    Baseline Good technique demo with log rolling technique with no verbal cues required; Improved squatting technique demo, but verbal/tactile cues required to maintain lumbar spine in neutral position    Time 2   Period Weeks   Status Partially Met           PT Long Term Goals - 12/02/15 1433    PT LONG TERM GOAL #1   Title Patient will report decreased back pain to 0-3/10 on a VAS in order to improve outdoor ambulation to >45 minutes.    Baseline Time has improved to 25-30 minutes, still limited by pain.    Time 7   Period Weeks   Status Partially Met   PT LONG TERM GOAL #2   Title Patient will demo improved LE and core strength to 4+/5 MMT grade in order to improve tolerance with leisure activities.   Baseline Core activation continues to present with altered firing patterns and poor quality.    Time 7   Period Weeks   Status On-going   PT LONG TERM GOAL #4    Title Patient will be able to lift 15# from low counter with proper squatting technique in order to progress towards work related activities.    Baseline Referring provider has restricted him 10# only.    Time 7   Period Weeks   Status Deferred   PT LONG TERM GOAL #5   Title Patient will improve lumbar spine AROM to [facility] in all directions within post-op restrictions.   Baseline Mobility is improving but largely dictated by spastic R lumbar musculature and still very allodynic.    Time 7   Period Weeks   Status Partially Met               Plan - 12/29/15 1010    Clinical Impression Statement Continued to progress core stab.  Pt able to complete all exercises today without rest breaks or c/o pain.  Pt required less cues to complete exericses correctly.  Began lunges and squats with good form and no difficulty.     Rehab Potential Good   Clinical Impairments Affecting Rehab Potential hx of lumbar disc displacement due to work related injury, reports this still has not been resolved resulitng in intermittent shooting pain on the Right side   PT Frequency 2x / week   PT Duration 4 weeks   PT Treatment/Interventions Electrical Stimulation;Moist Heat;Gait training;Functional mobility training;Therapeutic activities;Therapeutic exercise;Manual techniques;Patient/family education;Scar mobilization;Passive range of motion;Taping   PT Next Visit Plan Continue with quadruped activities, and monitor endurance with activity.    Consulted and Agree with Plan of Care Patient      Patient will benefit from skilled therapeutic intervention in order to improve the following deficits and impairments:  Abnormal gait, Decreased endurance, Hypomobility, Decreased scar mobility, Decreased strength, Pain, Decreased activity tolerance, Difficulty walking, Improper body mechanics, Decreased range of motion, Impaired flexibility, Postural dysfunction, Decreased mobility  Visit Diagnosis: Right-sided low back  pain without sciatica  Muscle weakness (generalized)  Other abnormalities of gait and mobility  Disc displacement, lumbar  Chronic right-sided low back pain without sciatica  Muscle weakness  Joint stiffness of spine  Abnormality of gait  S/P lumbar laminectomy     Problem List There are no active problems to display for this patient.   Teena Irani, PTA/CLT [phone]  12/29/2015, 10:14 AM  [facility]  [address] [place], [place], [address] Phone: [phone]   Fax:  [phone]  Name: [name]. MRN: [id number] Date of Birth: January 25, 1981

## 2015-12-31 ENCOUNTER — Ambulatory Visit (HOSPITAL_COMMUNITY): Payer: BLUE CROSS/BLUE SHIELD | Admitting: Physical Therapy

## 2015-12-31 DIAGNOSIS — M545 Low back pain, unspecified: Secondary | ICD-10-CM

## 2015-12-31 DIAGNOSIS — R2689 Other abnormalities of gait and mobility: Secondary | ICD-10-CM

## 2015-12-31 DIAGNOSIS — M6281 Muscle weakness (generalized): Secondary | ICD-10-CM

## 2015-12-31 NOTE — Therapy (Signed)
[facility] [address] [place], [place], [address] Phone: [phone]   Fax:  [phone]  Physical Therapy Treatment/Discharge  Patient Details  Name: Nathan L Everton Jr. MRN: [id number] Date of Birth: 01/10/1981 Referring Provider: Gary Cram, MD  Encounter Date: 12/31/2015      PT End of Session - 12/31/15 1440    Visit Number 25   Number of Visits 31   Date for PT Re-Evaluation 01/02/16   Authorization Type [employer]- reassessment done on 12/02/15    Authorization Time Period 10/23/2015 to 12/04/2015; --> 12/03/15 to 01/03/16    PT Start Time 1305   PT Stop Time 1345   PT Time Calculation (min) 40 min   Activity Tolerance Patient tolerated treatment well   Behavior During Therapy WFL for tasks assessed/performed      Past Medical History  Diagnosis Date  . Allergy 2012    dogs/cats and bactirm    Past Surgical History  Procedure Laterality Date  . Fracture surgery Left     ORIF   . Spine surgery Right 06/16/2015    p lumbar laminectomy and microdisectomy at L5-S1 on the right    There were no vitals filed for this visit.      Subjective Assessment - 12/31/15 1309    Subjective Pt reports he has made some improvement lately. He feels he is walking a little better and is able to stand/walk a little longer without too much pain. He still has issues when he lifts objects.   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 unlimited    How long can you stand comfortably? 40 minutes   How long can you walk comfortably? unlimited, but notices he starts slouching    Diagnostic tests MRI= herniated NP at L5-S1   Patient Stated Goals Pt's goal is to reduce his LBP, return to sports related activities, return to work, and to be able to play with his son.    Currently in Pain? Yes   Pain Score 2    Pain Location Back   Pain Descriptors / Indicators Aching   Pain  Type Chronic pain   Pain Radiating Towards none    Pain Onset More than a month ago   Pain Frequency Constant   Aggravating Factors  lifting and very long walks   Pain Relieving Factors sitting, medication   Effect of Pain on Daily Activities only slightly limited   Multiple Pain Sites No            [facility] PT Assessment - 12/31/15 0001    Assessment   Medical Diagnosis Lumbago   Referring Provider Gary Cram, MD   Onset Date/Surgical Date 06/16/15   Hand Dominance Right   Next MD Visit --  referred to Mark Roy in [place] and waiting to hear back.    Prior Therapy PT with different clinic prior to his recent surgery    Precautions   Precautions Back  DC with recs to not lift over 10lbs, and to 'be careful'   Balance Screen   Has the patient fallen in the past 6 months No   Has the patient had a decrease in activity level because of a fear of falling?  No   Is the patient reluctant to leave their home because of a fear of falling?  No   Strength   Right Hip Flexion 5/5   Right Hip   Extension 4+/5   Right Hip External Rotation  5/5   Right Hip Internal Rotation 5/5   Right Hip ABduction 5/5   Left Hip Flexion 5/5   Left Hip Extension 4+/5   Left Hip ABduction 5/5   Right Knee Flexion 5/5   Right Knee Extension 5/5   Left Knee Flexion 5/5   Left Knee Extension 5/5   Right Ankle Dorsiflexion 5/5   Left Ankle Dorsiflexion 5/5                     [facility] Adult PT Treatment/Exercise - 12/31/15 0001    Therapeutic Activites    Therapeutic Activities Lifting   Lifting lifting 30# box with demonstration and cues to encourage lifting with LE and keeping object close to body   Exercises   Other Exercises  resisted trunk rotation with BUE elevation 2x20 each directions   Lumbar Exercises: Supine   Other Supine Lumbar Exercises lower trunk rotation x20                PT Education - 12/31/15 1439    Education provided Yes   Education Details importance of HEP  adherence even though PT is over; encouraged pt to realize the difference between pain and stiffness to prevent fear avoidant behavior; correct lifting mechanics   Person(s) Educated Patient   Methods Explanation;Demonstration;Handout   Comprehension Verbalized understanding;Returned demonstration          PT Short Term Goals - 12/31/15 1319    PT SHORT TERM GOAL #1   Title Patient will independently demo full understanding with initial HEP in order to progress towards improved function.    Baseline Pt required vebal cues for proper technique and parameters   Time 2   Period Weeks   Status Achieved   PT SHORT TERM GOAL #2   Title Patient will report decreased R-sided LBP  to 0-5/10 on a VAS in order to improve his ability to sleep with less than 2 interruptions/ night.   Baseline LBP ranges between a 2-8/10 on a VAS; 5/10 VAS   Time 3   Period Weeks   Status Achieved   PT SHORT TERM GOAL #3   Title Patient will safely demo proper body mechanics with squats and log rolling in order to reduce stress on the lumbar spine with ADLs and household activities.    Baseline Good technique demo with log rolling technique with no verbal cues required; Improved squatting technique demo, but verbal/tactile cues required to maintain lumbar spine in neutral position    Time 2   Period Weeks   Status Achieved           PT Long Term Goals - 12/31/15 1320    PT LONG TERM GOAL #1   Title Patient will report decreased back pain to 0-3/10 on a VAS in order to improve outdoor ambulation to >45 minutes.    Baseline pt reporting he is no longer limited with walking. Reports up to 5/10 pain/stiffness in the mornings or when he spends all day at a game   Time 7   Period Weeks   Status Partially Met   PT LONG TERM GOAL #2   Title Patient will demo improved LE and core strength to 4+/5 MMT grade in order to improve tolerance with leisure activities.   Baseline Core activation continues to present with  altered firing patterns and poor quality.    Time 7   Period Weeks   Status Achieved     PT LONG TERM GOAL #4   Title Patient will be able to lift 15# from low counter with proper squatting technique in order to progress towards work related activities.    Baseline --   Time 7   Period Weeks   Status Achieved   PT LONG TERM GOAL #5   Title Patient will improve lumbar spine AROM to WFL in all directions within post-op restrictions.   Baseline limited lumbar extension with increased awareness of pain response   Time 7   Period Weeks   Status Partially Met               Plan - 12/31/15 1441    Clinical Impression Statement Pt was reassessed this session having made slow progress but meeting several of his goals. He reports increased tolerance to activity throughout the day and improved strength/ROM. At this time, he presents with low back pain and he demonstrates limited lumbar flexion and hip extensor strength. PT noting pt with fair trunk endurance evident by report of fatigue with therex performed during the session. At this time, although he has remaining deficits in ROM and strength, the pt feels he can continue to work on his exercises at home. I have reviewed his HEP, adding therex to address stiffness and trunk endurance, and pt was able to return demonstration and good understanding of lifting mechanics. Pt is being discharged from PT due to a plateau in progress and with independence/understanding of advanced home management program.    Rehab Potential Good   Clinical Impairments Affecting Rehab Potential hx of lumbar disc displacement due to work related injury, reports this still has not been resolved resulitng in intermittent shooting pain on the Right side   PT Frequency 2x / week   PT Duration 4 weeks   PT Treatment/Interventions Electrical Stimulation;Moist Heat;Gait training;Functional mobility training;Therapeutic activities;Therapeutic exercise;Manual  techniques;Patient/family education;Scar mobilization;Passive range of motion;Taping   PT Next Visit Plan d/c   PT Home Exercise Plan addition of seated resisting trunk rotation with BUE elevation, QL stretch, lower trunk rotation, continued walking   Recommended Other Services none   Consulted and Agree with Plan of Care Patient      Patient will benefit from skilled therapeutic intervention in order to improve the following deficits and impairments:  Abnormal gait, Decreased endurance, Hypomobility, Decreased scar mobility, Decreased strength, Pain, Decreased activity tolerance, Difficulty walking, Improper body mechanics, Decreased range of motion, Impaired flexibility, Postural dysfunction, Decreased mobility  Visit Diagnosis: Right-sided low back pain without sciatica  Muscle weakness (generalized)  Other abnormalities of gait and mobility  PHYSICAL THERAPY DISCHARGE SUMMARY  Visits from Start of Care: 25  Current functional level related to goals / functional outcomes: Improved ROM, strength, activity tolerance, see clinical impression for more details   Remaining deficits: Limited lumbar extension, pain with extended activity, trunk endurance   Education / Equipment: Advanced home management program, lifting mechanics Plan: Patient agrees to discharge.  Patient goals were partially met. Patient is being discharged due to being pleased with the current functional level.  ?????        Problem List There are no active problems to display for this patient.  2:50 PM,12/31/2015 Sara Kiser PT, DPT [facility] [phone]   [facility] [address] [place], [place], [address] Phone: [phone]   Fax:  [phone]  Name: Melroy L Plaskett Jr. MRN: [id number] Date of Birth: 07/28/1980     

## 2016-01-05 ENCOUNTER — Encounter (HOSPITAL_COMMUNITY): Payer: BLUE CROSS/BLUE SHIELD

## 2016-01-07 ENCOUNTER — Encounter (HOSPITAL_COMMUNITY): Payer: BLUE CROSS/BLUE SHIELD | Admitting: Physical Therapy

## 2016-01-12 ENCOUNTER — Encounter (HOSPITAL_COMMUNITY): Payer: BLUE CROSS/BLUE SHIELD | Admitting: Physical Therapy

## 2016-01-14 ENCOUNTER — Encounter (HOSPITAL_COMMUNITY): Payer: BLUE CROSS/BLUE SHIELD | Admitting: Physical Therapy

## 2016-01-19 ENCOUNTER — Encounter (HOSPITAL_COMMUNITY): Payer: BLUE CROSS/BLUE SHIELD | Admitting: Physical Therapy

## 2016-01-21 ENCOUNTER — Encounter (HOSPITAL_COMMUNITY): Payer: BLUE CROSS/BLUE SHIELD | Admitting: Physical Therapy

## 2016-06-21 ENCOUNTER — Encounter (HOSPITAL_COMMUNITY): Payer: Self-pay | Admitting: Emergency Medicine

## 2016-06-21 ENCOUNTER — Emergency Department (HOSPITAL_COMMUNITY)
Admission: EM | Admit: 2016-06-21 | Discharge: 2016-06-21 | Disposition: A | Discharge status: Home / Self Care | Payer: BLUE CROSS/BLUE SHIELD | Attending: Dermatology | Admitting: Dermatology

## 2016-06-21 DIAGNOSIS — F17210 Nicotine dependence, cigarettes, uncomplicated: Secondary | ICD-10-CM | POA: Insufficient documentation

## 2016-06-21 DIAGNOSIS — R51 Headache: Secondary | ICD-10-CM | POA: Insufficient documentation

## 2016-06-21 DIAGNOSIS — Z79899 Other long term (current) drug therapy: Secondary | ICD-10-CM | POA: Diagnosis not present

## 2016-06-21 DIAGNOSIS — Z5321 Procedure and treatment not carried out due to patient leaving prior to being seen by health care provider: Secondary | ICD-10-CM | POA: Insufficient documentation

## 2016-06-21 HISTORY — DX: Headache, unspecified: R51.9

## 2016-06-21 HISTORY — DX: Headache: R51

## 2016-06-21 NOTE — ED Notes (Signed)
PT told registration he was leaving at this time.  

## 2016-06-21 NOTE — ED Triage Notes (Signed)
PT c/o headache that started today and was seen at PCP today and states he has an outpatient MRI scheduled for Monday and states he has clutter headaches that usually last for 30 minutes but no relief today. PT states light and noise sensitivity and nausea at times.

## 2016-11-17 ENCOUNTER — Other Ambulatory Visit: Payer: Self-pay | Admitting: Neurosurgery

## 2016-11-17 DIAGNOSIS — M5440 Lumbago with sciatica, unspecified side: Secondary | ICD-10-CM

## 2016-11-30 ENCOUNTER — Ambulatory Visit
Admission: RE | Admit: 2016-11-30 | Discharge: 2016-11-30 | Disposition: A | Discharge status: Home / Self Care | Payer: BLUE CROSS/BLUE SHIELD | Source: Ambulatory Visit | Attending: Neurosurgery | Admitting: Neurosurgery

## 2016-11-30 DIAGNOSIS — E882 Lipomatosis, not elsewhere classified: Secondary | ICD-10-CM | POA: Insufficient documentation

## 2016-11-30 DIAGNOSIS — M5136 Other intervertebral disc degeneration, lumbar region: Secondary | ICD-10-CM | POA: Insufficient documentation

## 2016-11-30 DIAGNOSIS — M5440 Lumbago with sciatica, unspecified side: Secondary | ICD-10-CM | POA: Diagnosis present

## 2016-11-30 DIAGNOSIS — Q7649 Other congenital malformations of spine, not associated with scoliosis: Secondary | ICD-10-CM | POA: Diagnosis not present

## 2016-11-30 DIAGNOSIS — M48061 Spinal stenosis, lumbar region without neurogenic claudication: Secondary | ICD-10-CM | POA: Insufficient documentation

## 2016-12-27 ENCOUNTER — Other Ambulatory Visit: Payer: Self-pay | Admitting: Nurse Practitioner

## 2016-12-27 ENCOUNTER — Ambulatory Visit
Admission: RE | Admit: 2016-12-27 | Discharge: 2016-12-27 | Disposition: A | Discharge status: Home / Self Care | Payer: BLUE CROSS/BLUE SHIELD | Source: Ambulatory Visit | Attending: Nurse Practitioner | Admitting: Nurse Practitioner

## 2016-12-27 ENCOUNTER — Ambulatory Visit: Payer: BLUE CROSS/BLUE SHIELD | Attending: Nurse Practitioner | Admitting: Nurse Practitioner

## 2016-12-27 ENCOUNTER — Encounter: Payer: Self-pay | Admitting: Nurse Practitioner

## 2016-12-27 VITALS — BP 116/61 | HR 71 | Temp 98.2°F | Resp 16 | Ht 71.0 in | Wt 275.0 lb

## 2016-12-27 DIAGNOSIS — Z79891 Long term (current) use of opiate analgesic: Secondary | ICD-10-CM | POA: Insufficient documentation

## 2016-12-27 DIAGNOSIS — M48062 Spinal stenosis, lumbar region with neurogenic claudication: Secondary | ICD-10-CM | POA: Insufficient documentation

## 2016-12-27 DIAGNOSIS — G8929 Other chronic pain: Secondary | ICD-10-CM | POA: Insufficient documentation

## 2016-12-27 DIAGNOSIS — Z79899 Other long term (current) drug therapy: Secondary | ICD-10-CM | POA: Insufficient documentation

## 2016-12-27 DIAGNOSIS — M533 Sacrococcygeal disorders, not elsewhere classified: Secondary | ICD-10-CM | POA: Diagnosis not present

## 2016-12-27 DIAGNOSIS — Z833 Family history of diabetes mellitus: Secondary | ICD-10-CM | POA: Diagnosis not present

## 2016-12-27 DIAGNOSIS — M5136 Other intervertebral disc degeneration, lumbar region: Secondary | ICD-10-CM | POA: Insufficient documentation

## 2016-12-27 DIAGNOSIS — G894 Chronic pain syndrome: Secondary | ICD-10-CM

## 2016-12-27 DIAGNOSIS — G4730 Sleep apnea, unspecified: Secondary | ICD-10-CM | POA: Diagnosis not present

## 2016-12-27 DIAGNOSIS — M5442 Lumbago with sciatica, left side: Secondary | ICD-10-CM

## 2016-12-27 DIAGNOSIS — F17210 Nicotine dependence, cigarettes, uncomplicated: Secondary | ICD-10-CM | POA: Diagnosis not present

## 2016-12-27 DIAGNOSIS — M48061 Spinal stenosis, lumbar region without neurogenic claudication: Secondary | ICD-10-CM | POA: Diagnosis not present

## 2016-12-27 DIAGNOSIS — M51379 Other intervertebral disc degeneration, lumbosacral region without mention of lumbar back pain or lower extremity pain: Secondary | ICD-10-CM

## 2016-12-27 DIAGNOSIS — M5137 Other intervertebral disc degeneration, lumbosacral region: Secondary | ICD-10-CM

## 2016-12-27 DIAGNOSIS — M5440 Lumbago with sciatica, unspecified side: Secondary | ICD-10-CM | POA: Diagnosis not present

## 2016-12-27 DIAGNOSIS — Z882 Allergy status to sulfonamides status: Secondary | ICD-10-CM | POA: Insufficient documentation

## 2016-12-27 DIAGNOSIS — M545 Low back pain: Secondary | ICD-10-CM | POA: Diagnosis not present

## 2016-12-27 DIAGNOSIS — M5441 Lumbago with sciatica, right side: Secondary | ICD-10-CM

## 2016-12-27 HISTORY — DX: Other intervertebral disc degeneration, lumbosacral region without mention of lumbar back pain or lower extremity pain: M51.379

## 2016-12-27 HISTORY — DX: Other intervertebral disc degeneration, lumbosacral region: M51.37

## 2016-12-27 NOTE — Patient Instructions (Addendum)
°____________________________________________________________________________________________ ° °Appointment Policy ° °It is our goal and responsibility to provide the medical community with assistance in the evaluation and management of patients with chronic pain. Unfortunately our resources are limited. Because we do not have an unlimited amount of time, or available appointments, we are required to closely monitor and manage their use. The following rules exist to maximize their use: ° °Patient's responsibilities: °1. Punctuality: You are required to be physically present and registered in our facility at least 30 minutes before your appointment. °2. Tardiness: The cutoff is your appointment time. If you have an appointment scheduled for 10:00 AM and you arrive at 10:01, you will be required to reschedule your appointment.  °3. Plan ahead: Always assume that you will encounter traffic on your way in. Plan for it. If you are dependent on a driver, make sure they understand these rules and the need to arrive early. °4. Other appointments and responsibilities: Avoid scheduling any other appointments before or after your pain clinic appointments.  °5. Be prepared: Write down everything that you need to discuss with your healthcare provider and give this information to the admitting nurse. Write down the medications that you will need refilled. Bring your pills and bottles (even the empty ones), to all of your appointments, except for those where a procedure is scheduled. °6. No children or pets: Find someone to take care of them. It is not appropriate to bring them in. °7. Scheduling changes: We request "advanced notification" of any changes or cancellations. °8. Advanced notification: Defined as a time period of more than 24 hours prior to the originally scheduled appointment. This allows for the appointment to be offered to other patients. °9. Rescheduling: When a visit is rescheduled, it will require the  cancellation of the original appointment. For this reason they both fall within the category of "Cancellations".  °10. Cancellations: They require advanced notification. Any cancellation less than 24 hours before the  appointment will be recorded as a "No Show". °11. No Show: Defined as an unkept appointment where the patient failed to notify or declare to the practice their intention or inability to keep the appointment. ° °Corrective process for repeat offenders:  °1. Tardiness: Three (3) episodes of rescheduling due to late arrivals will be recorded as one (1) "No Show". °2. Cancellation or reschedule: Three (3) cancellations or rescheduling will be recorded as one (1) "No Show". °3. "No Shows": Three (3) "No Shows" within a 12 month period will result in discharge from the practice. ° °____________________________________________________________________________________________ ° °____________________________________________________________________________________________ ° °Pain Scale ° °Introduction: The pain score used by this practice is the Verbal Numerical Rating Scale (VNRS-11). This is an 11-point scale. It is for adults and children 10 years or older. There are significant differences in how the pain score is reported, used, and applied. Forget everything you learned in the past and learn this scoring system. ° °General Information: The scale should reflect your current level of pain. Unless you are specifically asked for the level of your worst pain, or your average pain. If you are asked for one of these two, then it should be understood that it is over the past 24 hours. ° °Basic Activities of Daily Living (ADL): Personal hygiene, dressing, eating, transferring, and using restroom. ° °Instructions: Most patients tend to report their level of pain as a combination of two factors, their physical pain and their psychosocial pain. This last one is also known as “suffering” and it is reflection of how  physical pain   affects you socially and psychologically. From now on, report them separately. From this point on, when asked to report your pain level, report only your physical pain. Use the following table for reference. ° °Pain Clinic Pain Levels (0-5/10)  °Pain Level Score  Description  °No Pain 0   °Mild pain 1 Nagging, annoying, but does not interfere with basic activities of daily living (ADL). Patients are able to eat, bathe, get dressed, toileting (being able to get on and off the toilet and perform personal hygiene functions), transfer (move in and out of bed or a chair without assistance), and maintain continence (able to control bladder and bowel functions). Blood pressure and heart rate are unaffected. A normal heart rate for a healthy adult ranges from 60 to 100 bpm (beats per minute). °  °Mild to moderate pain 2 Noticeable and distracting. Impossible to hide from other people. More frequent flare-ups. Still possible to adapt and function close to normal. It can be very annoying and may have occasional stronger flare-ups. With discipline, patients may get used to it and adapt. °  °Moderate pain 3 Interferes significantly with activities of daily living (ADL). It becomes difficult to feed, bathe, get dressed, get on and off the toilet or to perform personal hygiene functions. Difficult to get in and out of bed or a chair without assistance. Very distracting. With effort, it can be ignored when deeply involved in activities. °  °Moderately severe pain 4 Impossible to ignore for more than a few minutes. With effort, patients may still be able to manage work or participate in some social activities. Very difficult to concentrate. °Signs of autonomic nervous system discharge are evident: dilated pupils (mydriasis); mild sweating (diaphoresis); sleep interference. Heart rate becomes elevated (>115 bpm). Diastolic blood pressure (lower number) rises above 100 mmHg. Patients find relief in laying down and not  moving. °  °Severe pain 5 Intense and extremely unpleasant. Associated with frowning face and frequent crying. Pain overwhelms the senses.  Ability to do any activity or maintain social relationships becomes significantly limited. Conversation becomes difficult. Pacing back and forth is common, as getting into a comfortable position is nearly impossible. Pain wakes you up from deep sleep. Physical signs will be obvious: pupillary dilation; increased sweating; goosebumps; brisk reflexes; cold, clammy hands and feet; nausea, vomiting or dry heaves; loss of appetite; significant sleep disturbance with inability to fall asleep or to remain asleep. When persistent, significant weight loss is observed due to the complete loss of appetite and sleep deprivation.  Blood pressure and heart rate becomes significantly elevated. °Caution: If elevated blood pressure triggers a pounding headache associated with blurred vision, then the patient should immediately seek attention at an urgent or emergency care unit, as these may be signs of an impending stroke. °  ° °Emergency Department Pain Levels (6-10/10)  °Emergency Room Pain 6 Severely limiting. Requires emergency care and should not be seen or managed at an outpatient pain management facility. Communication becomes difficult and requires great effort. Assistance to reach the emergency department may be required. Facial flushing and profuse sweating along with potentially dangerous increases in heart rate and blood pressure will be evident. °  °Distressing pain 7 Self-care is very difficult. Assistance is required to transport, or use restroom. Assistance to reach the emergency department will be required. Tasks requiring coordination, such as bathing and getting dressed become very difficult. °  °Disabling pain 8 Self-care is no longer possible. At this level, pain is disabling. The individual is   unable to do even the most “basic” activities such as walking, eating, bathing,  dressing, transferring to a bed, or toileting. Fine motor skills are lost. It is difficult to think clearly. °  °Incapacitating pain 9 Pain becomes incapacitating. Thought processing is no longer possible. Difficult to remember your own name. Control of movement and coordination are lost. °  °The worst pain imaginable 10 At this level, most patients pass out from pain. When this level is reached, collapse of the autonomic nervous system occurs, leading to a sudden drop in blood pressure and heart rate. This in turn results in a temporary and dramatic drop in blood flow to the brain, leading to a loss of consciousness. Fainting is one of the body’s self defense mechanisms. Passing out puts the brain in a calmed state and causes it to shut down for a while, in order to begin the healing process. °  ° °Summary: °1. Refer to this scale when providing us with your pain level. °2. Be accurate and careful when reporting your pain level. This will help with your care. °3. Over-reporting your pain level will lead to loss of credibility. °4. Even a level of 1/10 means that there is pain and will be treated at our facility. °5. High, inaccurate reporting will be documented as “Symptom Exaggeration”, leading to loss of credibility and suspicions of possible secondary gains such as obtaining more narcotics, or wanting to appear disabled, for fraudulent reasons. °6. Only pain levels of 5 or below will be seen at our facility. °7. Pain levels of 6 and above will be sent to the Emergency Department and the appointment cancelled. °____________________________________________________________________________________________ ° ° °____________________________________________________________________________________________ ° °Medication Rules ° °Applies to: All patients receiving prescriptions (written or electronic). ° °Pharmacy of record: Pharmacy where electronic prescriptions will be sent. If written prescriptions are taken to a  different pharmacy, please inform the nursing staff. The pharmacy listed in the electronic medical record should be the one where you would like electronic prescriptions to be sent. ° °Prescription refills: Only during scheduled appointments. Applies to both, written and electronic prescriptions. ° °NOTE: The following applies primarily to controlled substances (Opioid Pain Medications) ° °Patient's responsibilities: °1. Pain Pills: Bring all pain pills to every appointment (except for procedure appointments). °2. Pill Bottles: Bring pills in original pharmacy bottle. Always bring newest bottle. Bring bottle, even if empty. °3. Medication refills: You are responsible for knowing and keeping track of what medications you need refilled. The day before your appointment, write a list of all prescriptions that need to be refilled. Bring that list to your appointment and give it to the admitting nurse. Prescriptions will be written only during appointments. If you forget a medication, it will not be "Called in", "Faxed", or "electronically sent". You will need to get another appointment to get these prescribed. °4. Prescription Accuracy: You are responsible for carefully inspecting your prescriptions before leaving our office. Have the discharge nurse carefully go over each prescription with you, before taking them home. Make sure that your name is accurately spelled, that your address is correct. Check the name and dose of your medication to make sure it is accurate. Check the number of pills, and the written instructions to make sure they are clear and accurate. Make sure that you are given enough medication to last until your next medication refill appointment. °5. Taking Medication: Take medication as prescribed. Never take more pills than instructed. Never take medication more frequently than prescribed. Taking less pills or less   frequently is permitted and encouraged, when it comes to controlled substances (written  prescriptions).  °6. Inform other Doctors: Always inform, all of your healthcare providers, of all the medications you take. °7. Pain Medication from other Providers: You are not allowed to accept any additional pain medication from any other Doctor or Healthcare provider. There are two exceptions to this rule. (see below) In the event that you require additional pain medication, you are responsible for notifying us, as stated below. °8. Medication Agreement: You are responsible for carefully reading and following our Medication Agreement. This must be signed before receiving any prescriptions from our practice. Safely store a copy of your signed Agreement. Violations to the Agreement will result in no further prescriptions. (Additional copies of our Medication Agreement are available upon request.) °9. Laws, Rules, & Regulations: All patients are expected to follow all Federal and State Laws, Statutes, Rules, & Regulations. Ignorance of the Laws does not constitute a valid excuse. ° °Exceptions: There are only two exceptions to the rule of not receiving pain medications from other Healthcare Providers. °1. Exception #1 (Emergencies): In the event of an emergency (i.e.: accident requiring emergency care), you are allowed to receive additional pain medication. However, you are responsible for: As soon as you are able, call our office [phone], at any time of the day or night, and leave a message stating your name, the date and nature of the emergency, and the name and dose of the medication prescribed. In the event that your call is answered by a member of our staff, make sure to document and save the date, time, and the name of the person that took your information.  °2. Exception #2 (Planned Surgery): In the event that you are scheduled by another doctor or dentist to have any type of surgery or procedure, you are allowed (for a period no longer than 30 days), to receive additional pain medication, for the  acute post-op pain. However, in this case, you are responsible for picking up a copy of our "Post-op Pain Management for Surgeons" handout, and giving it to your surgeon or dentist. This document is available at our office, and does not require an appointment to obtain it. Simply go to our office during business hours (Monday-Thursday from 8:00 AM to 4:00 PM) (Friday 8:00 AM to 12:00 Noon) or if you have a scheduled appointment with us, prior to your surgery, and ask for it by name. In addition, you will need to provide us with your name, name of your surgeon, type of surgery, and date of procedure or surgery. ° °____________________________________________________________________________________________ °____________________________________________________________________________________________ ° °DRUG HOLIDAYS ° °Definitions °Tolerance: defined as the progressively decreased responsiveness to a drug. Occurs when the drug is used repeatedly and the body adapts to the continued presence of the drug. As a result, a larger dose of the drug is needed to achieve the effect originally obtained by a smaller dose. It is thought to be due to the formation of excess opioid receptors. ° °Drug Holiday: is when a patient stops taking a medication(s) for a period of time; anywhere from a few days to several weeks. ° °Withdrawals: refers to the wide range of symptoms that occur after stopping or dramatically reducing opiate drugs after heavy and prolonged use. Withdrawal symptoms do not occur to patients that use low dose opioids, or those who take the medication sporadically. Contrary to benzodiazepine (example: Valium, Xanax, etc.) or alcohol withdrawals (“Delirium Tremens”), opioid withdrawals are not lethal. Withdrawals are the physical   manifestation of the body getting rid of the excess receptors. ° °Purpose °To eliminate tolerance. ° °Duration of Holiday °14 consecutive days. (2 weeks) ° °Expected Symptoms °Early symptoms  of withdrawal include: °• Agitation °• Anxiety °• Muscle aches °• Increased tearing °• Insomnia °• Runny nose °• Sweating °• Yawning ° °Late symptoms of withdrawal include: °• Abdominal cramping °• Diarrhea °• Dilated pupils °• Goose bumps °• Nausea °• Vomiting ° °Opioid withdrawal reactions are very uncomfortable but are not life-threatening. Symptoms usually start within °12 hours of last opioid dose and within 30 hours of last methadone exposure. ° °Duration of Symptoms °48 to 72 hours for short acting medications and 2 to 14 days for methadone. ° °Treatment °• Clonidine (Catapres™) or tizanidine (Zanaflex™) for agitation, sweating, tearing, runny nose. °• Promethazine (Phenergan™) for nausea, vomiting. °• NSAIDs for pain. ° °Benefits °• Improved effectiveness of opioids. °• Decreased opioid dose needed to achieve benefits. °• Improved pain with lesser dose. ° °Please get your x-rays done as soon as possible. °____________________________________________________________________________________________ ° ° °

## 2016-12-27 NOTE — Progress Notes (Signed)
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 Instructions    ____________________________________________________________________________________________  Appointment Policy  It is our goal and responsibility to provide the medical community with assistance in the evaluation and management of patients with chronic pain. Unfortunately our resources are limited. Because we do not have an unlimited amount of time, or available appointments, we are required to closely monitor and manage their use. The following rules exist to maximize their use:  Patient's responsibilities: 1. Punctuality: You are required to be physically present and registered in our facility at least 30 minutes before your appointment. 2. Tardiness: The cutoff is your appointment time. If you have an appointment  scheduled for 10:00 AM and you arrive at 10:01, you will be required to reschedule your appointment.  3. Plan ahead: Always assume that you will encounter traffic on your way in. Plan for it. If you are dependent on a driver, make sure they understand these rules and the need to arrive early. 4. Other appointments and responsibilities: Avoid scheduling any other appointments before or after your pain clinic appointments.  5. Be prepared: Write down everything that you need to discuss with your healthcare provider and give this information to the admitting nurse. Write down the medications that you will need refilled. Bring your pills and bottles (even the empty ones), to all of your appointments, except for those where a procedure is scheduled. 6. No children or pets: Find someone to take care of them. It is not appropriate to bring them in. 7. Scheduling changes: We request "advanced notification" of any changes or cancellations. 8. Advanced notification: Defined as a time period of more than 24  hours prior to the originally scheduled appointment. This allows for the appointment to be offered to other patients. 9. Rescheduling: When a visit is rescheduled, it will require the cancellation of the original appointment. For this reason they both fall within the category of "Cancellations".  10. Cancellations: They require advanced notification. Any cancellation less than 24 hours before the  appointment will be recorded as a "No Show". 11. No Show: Defined as an unkept appointment where the patient failed to notify or declare to the practice their intention or inability to keep the appointment.  Corrective process for repeat offenders:  1. Tardiness: Three (3) episodes of rescheduling due to late arrivals will be recorded as one (1) "No Show". 2. Cancellation or reschedule: Three (3) cancellations or rescheduling will be recorded as one (1) "No Show". 3. "No Shows": Three (3) "No Shows" within a 12 month  period will result in discharge from the practice.  ____________________________________________________________________________________________  ____________________________________________________________________________________________  Pain Scale  Introduction: The pain score used by this practice is the Verbal Numerical Rating Scale (VNRS-11). This is an 11-point scale. It is for adults and children 10 years or older. There are significant differences in how the pain score is reported, used, and applied. Forget everything you learned in the past and learn this scoring system.  General Information: The scale should reflect your current level of pain. Unless you are specifically asked for the level of your worst pain, or your average pain. If you are asked for one of these two, then it should be understood that it is over the past 24 hours.  Basic Activities of Daily Living (ADL): Personal hygiene, dressing, eating, transferring, and using restroom.  Instructions: Most patients tend to report their level of pain as a combination of two factors, their physical pain and their psychosocial pain. This last one is also known as suffering and it is reflection of how physical pain affects you socially and psychologically. From now on, report them separately. From this point on, when asked to report your pain level, report only your physical pain. Use the following table for reference.  Pain Clinic Pain Levels (0-5/10)  Pain Level Score  Description  No Pain 0   Mild pain 1 Nagging, annoying, but does not interfere with basic activities of daily living (ADL). Patients are able to eat, bathe, get dressed, toileting (being able to get on and off the toilet and perform personal hygiene functions), transfer (move in and out of bed or a chair without assistance), and maintain continence (able to control bladder and bowel functions). Blood pressure and heart rate are unaffected. A normal heart rate for a  healthy adult ranges from 60 to 100 bpm (beats per minute).   Mild to moderate pain 2 Noticeable and distracting. Impossible to hide from other people. More frequent flare-ups. Still possible to adapt and function close to normal. It can be very annoying and may have occasional stronger flare-ups. With discipline, patients may get used to it and adapt.   Moderate pain 3 Interferes significantly with activities of daily living (ADL). It becomes difficult to feed, bathe, get dressed, get on and off the toilet or to perform personal hygiene functions. Difficult to get in and out of bed or a chair without assistance. Very distracting. With effort, it can be ignored when deeply involved in activities.   Moderately severe pain 4 Impossible to ignore for more than a few minutes. With effort, patients may still be able to manage work or participate  in some social activities. Very difficult to concentrate. Signs of autonomic nervous system discharge are evident: dilated pupils (mydriasis); mild sweating (diaphoresis); sleep interference. Heart rate becomes elevated (>115 bpm). Diastolic blood pressure (lower number) rises above 100 mmHg. Patients find relief in laying down and not moving.   Severe pain 5 Intense and extremely unpleasant. Associated with frowning face and frequent crying. Pain overwhelms the senses.  Ability to do any activity or maintain social relationships becomes significantly limited. Conversation becomes difficult. Pacing back and forth is common, as getting into a comfortable position is nearly impossible. Pain wakes you up from deep sleep. Physical signs will be obvious: pupillary dilation; increased sweating; goosebumps; brisk reflexes; cold, clammy hands and feet; nausea, vomiting or dry heaves; loss of appetite; significant sleep disturbance with inability to fall asleep or to remain asleep. When persistent, significant weight loss is observed due to the complete loss of appetite and sleep  deprivation.  Blood pressure and heart rate becomes significantly elevated. Caution: If elevated blood pressure triggers a pounding headache associated with blurred vision, then the patient should immediately seek attention at an urgent or emergency care unit, as these may be signs of an impending stroke.    Emergency Department Pain Levels (6-10/10)  Emergency Room Pain 6 Severely limiting. Requires emergency care and should not be seen or managed at an outpatient pain management facility. Communication becomes difficult and requires great effort. Assistance to reach the emergency department may be required. Facial flushing and profuse sweating along with potentially dangerous increases in heart rate and blood pressure will be evident.   Distressing pain 7 Self-care is very difficult. Assistance is required to transport, or use restroom. Assistance to reach the emergency department will be required. Tasks requiring coordination, such as bathing and getting dressed become very difficult.   Disabling pain 8 Self-care is no longer possible. At this level, pain is disabling. The individual is unable to do even the most basic activities such as walking, eating, bathing, dressing, transferring to a bed, or toileting. Fine motor skills are lost. It is difficult to think clearly.   Incapacitating pain 9 Pain becomes incapacitating. Thought processing is no longer possible. Difficult to remember your own name. Control of movement and coordination are lost.   The worst pain imaginable 10 At this level, most patients pass out from pain. When this level is reached, collapse of the autonomic nervous system occurs, leading to a sudden drop in blood pressure and heart rate. This in turn results in a temporary and dramatic drop in blood flow to the brain, leading to a loss of consciousness. Fainting is one of the bodys self defense mechanisms. Passing out puts the brain in a calmed state and causes it to shut down  for a while, in order to begin the healing process.    Summary: 1. Refer to this scale when providing [place] with your pain level. 2. Be accurate and careful when reporting your pain level. This will help with your care. 3. Over-reporting your pain level will lead to loss of credibility. 4. Even a level of 1/10 means that there is pain and will be treated at our facility. 5. High, inaccurate reporting will be documented as Symptom Exaggeration, leading to loss of credibility and suspicions of possible secondary gains such as obtaining more narcotics, or wanting to appear disabled, for fraudulent reasons. 6. Only pain levels of 5 or below will be seen at our facility. 7. Pain levels of 6 and above will  be sent to the Emergency Department and the appointment cancelled. ____________________________________________________________________________________________   ____________________________________________________________________________________________  Medication Rules  Applies to: All patients receiving prescriptions (written or electronic).  Pharmacy of record: Pharmacy where electronic prescriptions will be sent. If written prescriptions are taken to a different pharmacy, please inform the nursing staff. The pharmacy listed in the electronic medical record should be the one where you would like electronic prescriptions to be sent.  Prescription refills: Only during scheduled appointments. Applies to both, written and electronic prescriptions.  NOTE: The following applies primarily to controlled substances (Opioid Pain Medications)  Patient's responsibilities: 1. Pain Pills: Bring all pain pills to every appointment (except for procedure appointments). 2. Pill Bottles: Bring pills in original pharmacy bottle. Always bring newest bottle. Bring bottle, even if empty. 3. Medication refills: You are responsible for knowing and keeping track of what medications you need refilled. The day  before your appointment, write a list of all prescriptions that need to be refilled. Bring that list to your appointment and give it to the admitting nurse. Prescriptions will be written only during appointments. If you forget a medication, it will not be "Called in", "Faxed", or "electronically sent". You will need to get another appointment to get these prescribed. 4. Prescription Accuracy: You are responsible for carefully inspecting your prescriptions before leaving our office. Have the discharge nurse carefully go over each prescription with you, before taking them home. Make sure that your name is accurately spelled, that your address is correct. Check the name and dose of your medication to make sure it is accurate. Check the number of pills, and the written instructions to make sure they are clear and accurate. Make sure that you are given enough medication to last until your next medication refill appointment. 5. Taking Medication: Take medication as prescribed. Never take more pills than instructed. Never take medication more frequently than prescribed. Taking less pills or less frequently is permitted and encouraged, when it comes to controlled substances (written prescriptions).  6. Inform other Doctors: Always inform, all of your healthcare providers, of all the medications you take. 7. Pain Medication from other Providers: You are not allowed to accept any additional pain medication from any other Doctor or Healthcare provider. There are two exceptions to this rule. (see below) In the event that you require additional pain medication, you are responsible for notifying us, as stated below. 8. Medication Agreement: You are responsible for carefully reading and following our Medication Agreement. This must be signed before receiving any prescriptions from our practice. Safely store a copy of your signed Agreement. Violations to the Agreement will result in no further prescriptions. (Additional copies  of our Medication Agreement are available upon request.) 9. Laws, Rules, & Regulations: All patients are expected to follow all Federal and [employer], Rules, [employer]. Ignorance of the Laws does not constitute a valid excuse.  Exceptions: There are only two exceptions to the rule of not receiving pain medications from other Healthcare Providers. 1. Exception #1 (Emergencies): In the event of an emergency (i.e.: accident requiring emergency care), you are allowed to receive additional pain medication. However, you are responsible for: As soon as you are able, call our office [phone], at any time of the day or night, and leave a message stating your name, the date and nature of the emergency, and the name and dose of the medication prescribed. In the event that your call is answered by a member of our staff, make sure to  document and save the date, time, and the name of the person that took your information.  2. Exception #2 (Planned Surgery): In the event that you are scheduled by another doctor or dentist to have any type of surgery or procedure, you are allowed (for a period no longer than 30 days), to receive additional pain medication, for the acute post-op pain. However, in this case, you are responsible for picking up a copy of our "Post-op Pain Management for Surgeons" handout, and giving it to your surgeon or dentist. This document is available at our office, and does not require an appointment to obtain it. Simply go to our office during business hours (Monday-Thursday from 8:00 AM to 4:00 PM) (Friday 8:00 AM to 12:00 Noon) or if you have a scheduled appointment with [place], prior to your surgery, and ask for it by name. In addition, you will need to provide [place] with your name, name of your surgeon, type of surgery, and date of procedure or  surgery.  ____________________________________________________________________________________________ ____________________________________________________________________________________________  DRUG HOLIDAYS  Definitions Tolerance: defined as the progressively decreased responsiveness to a drug. Occurs when the drug is used repeatedly and the body adapts to the continued presence of the drug. As a result, a larger dose of the drug is needed to achieve the effect originally obtained by a smaller dose. It is thought to be due to the formation of excess opioid receptors.  Drug Holiday: is when a patient stops taking a medication(s) for a period of time; anywhere from a few days to several weeks.  Withdrawals: refers to the wide range of symptoms that occur after stopping or dramatically reducing opiate drugs after heavy and prolonged use. Withdrawal symptoms do not occur to patients that use low dose opioids, or those who take the medication sporadically. Contrary to benzodiazepine (example: Valium, Xanax, etc.) or alcohol withdrawals (Delirium Tremens), opioid withdrawals are not lethal. Withdrawals are the physical manifestation of the body getting rid of the excess receptors.  Purpose To eliminate tolerance.  Duration of Holiday 14 consecutive days. (2 weeks)  Expected Symptoms Early symptoms of withdrawal include:  Agitation  Anxiety  Muscle aches  Increased tearing  Insomnia  Runny nose  Sweating  Yawning  Late symptoms of withdrawal include:  Abdominal cramping  Diarrhea  Dilated pupils  Goose bumps  Nausea  Vomiting  Opioid withdrawal reactions are very uncomfortable but are not life-threatening. Symptoms usually start within 12 hours of last opioid dose and within 30 hours of last methadone exposure.  Duration of Symptoms 48 to 72 hours for short acting medications and 2 to 14 days for methadone.  Treatment  Clonidine (Catapres) or tizanidine  (Zanaflex) for agitation, sweating, tearing, runny nose.  Promethazine (Phenergan) for nausea, vomiting.  NSAIDs for pain.  Benefits  Improved effectiveness of opioids.  Decreased opioid dose needed to achieve benefits.  Improved pain with lesser dose.  ____________________________________________________________________________________________  Please get your x-rays done as soon as possible.

## 2016-12-27 NOTE — Progress Notes (Signed)
Safety precautions to be maintained throughout the outpatient stay will include: orient to surroundings, keep bed in low position, maintain call bell within reach at all times, provide assistance with transfer out of bed and ambulation.  

## 2016-12-28 ENCOUNTER — Encounter: Payer: Self-pay | Admitting: Nurse Practitioner

## 2016-12-30 LAB — COMPREHENSIVE METABOLIC PANEL
A/G RATIO: 1.2 (ref 1.2–2.2)
ALBUMIN: 4.2 g/dL (ref 3.5–5.5)
ALK PHOS: 56 IU/L (ref 39–117)
ALT: 15 IU/L (ref 0–44)
AST: 11 IU/L (ref 0–40)
BILIRUBIN TOTAL: 0.2 mg/dL (ref 0.0–1.2)
BUN / CREAT RATIO: 7 — AB (ref 9–20)
BUN: 6 mg/dL (ref 6–20)
CO2: 23 mmol/L (ref 20–29)
Calcium: 9 mg/dL (ref 8.7–10.2)
Chloride: 106 mmol/L (ref 96–106)
Creatinine, Ser: 0.83 mg/dL (ref 0.76–1.27)
GFR calc Af Amer: 133 mL/min/{1.73_m2} (ref >59)
GFR calc non Af Amer: 115 mL/min/{1.73_m2} (ref >59)
GLOBULIN, TOTAL: 3.4 g/dL (ref 1.5–4.5)
Glucose: 87 mg/dL (ref 65–99)
POTASSIUM: 4.3 mmol/L (ref 3.5–5.2)
SODIUM: 144 mmol/L (ref 134–144)
Total Protein: 7.6 g/dL (ref 6.0–8.5)

## 2016-12-30 LAB — SEDIMENTATION RATE: Sed Rate: 35 mm/hr — ABNORMAL HIGH (ref 0–15)

## 2016-12-30 LAB — C-REACTIVE PROTEIN: CRP: 8.5 mg/L — ABNORMAL HIGH (ref 0.0–4.9)

## 2016-12-30 LAB — 25-HYDROXYVITAMIN D LCMS D2+D3: 25-HYDROXY, VITAMIN D: 17 ng/mL — AB

## 2016-12-30 LAB — MAGNESIUM: Magnesium: 2.1 mg/dL (ref 1.6–2.3)

## 2016-12-30 LAB — VITAMIN B12: VITAMIN B 12: 462 pg/mL (ref 232–1245)

## 2016-12-30 LAB — 25-HYDROXY VITAMIN D LCMS D2+D3
25-Hydroxy, Vitamin D-2: <1 ng/mL
25-Hydroxy, Vitamin D-3: 17 ng/mL

## 2016-12-31 LAB — COMPLIANCE DRUG ANALYSIS, UR

## 2017-01-05 ENCOUNTER — Other Ambulatory Visit: Payer: Self-pay | Admitting: Nurse Practitioner

## 2017-01-05 ENCOUNTER — Encounter: Payer: Self-pay | Admitting: Nurse Practitioner

## 2017-01-05 DIAGNOSIS — E559 Vitamin D deficiency, unspecified: Secondary | ICD-10-CM | POA: Insufficient documentation

## 2017-01-05 MED ORDER — ERGOCALCIFEROL 1.25 MG (50000 UT) PO CAPS: 50000.0000 [IU] | ORAL_CAPSULE | ORAL | 0 refills | Status: DC

## 2017-02-15 ENCOUNTER — Encounter: Payer: Self-pay | Admitting: Pain Medicine

## 2017-02-15 DIAGNOSIS — M961 Postlaminectomy syndrome, not elsewhere classified: Secondary | ICD-10-CM | POA: Insufficient documentation

## 2017-02-15 DIAGNOSIS — G8929 Other chronic pain: Secondary | ICD-10-CM | POA: Insufficient documentation

## 2017-02-15 DIAGNOSIS — Z79891 Long term (current) use of opiate analgesic: Secondary | ICD-10-CM | POA: Insufficient documentation

## 2017-02-15 DIAGNOSIS — M79604 Pain in right leg: Secondary | ICD-10-CM

## 2017-02-15 DIAGNOSIS — M48061 Spinal stenosis, lumbar region without neurogenic claudication: Secondary | ICD-10-CM | POA: Insufficient documentation

## 2017-02-15 DIAGNOSIS — M47816 Spondylosis without myelopathy or radiculopathy, lumbar region: Secondary | ICD-10-CM | POA: Insufficient documentation

## 2017-02-15 DIAGNOSIS — M79605 Pain in left leg: Secondary | ICD-10-CM

## 2017-02-15 DIAGNOSIS — M25562 Pain in left knee: Secondary | ICD-10-CM

## 2017-02-15 DIAGNOSIS — M5136 Other intervertebral disc degeneration, lumbar region: Secondary | ICD-10-CM | POA: Insufficient documentation

## 2017-02-15 DIAGNOSIS — F1190 Opioid use, unspecified, uncomplicated: Secondary | ICD-10-CM

## 2017-02-15 HISTORY — DX: Opioid use, unspecified, uncomplicated: F11.90

## 2017-02-15 NOTE — Progress Notes (Signed)
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 options: Planned, scheduled, and/or pending:    Diagnostic bilateral lumbar facet block #1   Considering:   Diagnostic bilateral lumbar facet block  Possible bilateral lumbar facet RFA  Diagnostic bilateral sacroiliac joint block Possible bilateral sacroiliac joint RFA Diagnostic caudal epidural steroid injection + diagnostic epidurogram  Possible Racz procedure  Diagnostic left intra-articular knee injection with local anesthetic and steroid  Possible left series of 5 intra-articular Hyalgan knee injections  Diagnostic left Genicular nerve block  Possible left Genicular nerve RFA    PRN Procedures:   To be determined at a later time   Provider-requested follow-up: Return for Procedure (with sedation): Dx (B) L-FCT BLK.  Future Appointments Date Time Provider [place]  03/08/2017 8:45 AM Milinda Pointer, MD [facility] None    Primary Care Physician: Vidal Schwalbe, MD Location: [facility] Note by: Gaspar Cola, MD Date: 02/16/2017; Time: 12:47 PM  Patient Instructions   ____________________________________________________________________________________________  Preparing for Procedure with Sedation Instructions: . Oral Intake: Do not eat or drink anything for at least 8 hours prior to your procedure. . Transportation: Public transportation is not allowed. Bring an adult driver. The driver must be physically present in our waiting room before any procedure can be started. Marland Kitchen Physical Assistance: Bring an adult physically capable of assisting you, in the event you need help. This adult should keep you company at home for at least 6 hours after the procedure. . Blood Pressure Medicine: Take your blood pressure medicine with a sip of water the morning of the procedure. . Blood thinners:  . Diabetics on insulin: Notify the staff so that you can be scheduled 1st case in the morning. If your diabetes requires high dose insulin, take only  of your normal insulin dose the morning of the procedure and notify the staff that you have done so. . Preventing infections: Shower with an antibacterial soap the morning of your procedure. . Build-up your immune system: Take 1000 mg of Vitamin C with every meal (3 times a day) the day prior to your procedure. Marland Kitchen Antibiotics: Inform the staff if you have a condition or reason that requires you to take antibiotics before dental procedures. . Pregnancy: If you are pregnant, call and cancel the procedure. . Sickness: If you have a cold, fever, or any active infections, call and cancel the procedure. . Arrival: You must be in the facility at least 30 minutes prior to your scheduled procedure. . Children: Do not bring children with you. . Dress appropriately: Bring dark clothing that you would not mind if they get  stained. . Valuables: Do not bring any jewelry or valuables. Procedure appointments are reserved for interventional treatments only. Marland Kitchen No Prescription Refills. . No medication changes will be discussed during procedure appointments. . No disability issues will be discussed. ____________________________________________________________________________________________  ____________________________________________________________________________________________  Initial Gabapentin Titration  Medication used: Gabapentin (Generic Name) or Neurontin (Brand Name) 300 mg tablets/capsules  Reasons to stop increasing the dose:  Reason 1: You get good relief of symptoms, in which case there is no need to increase the daily dose any further.    Reason 2: You  develop some side effects, such as sleeping all of the time, difficulty concentrating, or becoming disoriented, in which case you need to go down on the dose, to the prior level, where you were not experiencing any side effects. Stay on that dose longer, to allow more time for your body to get use it, before attempting to increase it again.   Steps: Step 1: Start by taking 1 (one) tablet at bedtime x 7 (seven) days.  Step 2: After being on 1 (one) tablet for 7 (seven) days, then increase it to 2 (two) tablets at bedtime for another 7 (seven) days.  Step 3: Next, after being on 2 (two) tablets at bedtime for 7 (seven) days, then increase it to 3 (three) tablets at bedtime, and stay on that dose until you see your doctor.  Reasons to stop increasing the dose: Reason 1: You get good relief of symptoms, in which case there is no need to increase the daily dose any further.  Reason 2: You develop some side effects, such as sleeping all of the time, difficulty concentrating, or becoming disoriented, in which case you need to go down on the dose, to the prior level, where you were not experiencing any side effects. Stay on that dose longer, to allow  more time for your body to get use it, before attempting to increase it again.  Endpoint: Once you have reached the maximum dose you can tolerate without side-effects, contact your physician so as to evaluate the results of the regimen.   Questions: Feel free to contact us for any questions or problems at [phone] ____________________________________________________________________________________________  ____________________________________________________________________________________________  Risk(s) and Possible Complications  Patient Responsibilities: It is important that you read this as it is part of your informed consent. It is our duty to inform you of the risks and possible complications associated with treatments offered to you. It is your responsibility as a patient to read this and to ask questions about anything that is not clear or that you believe was not covered in this document.  Patient's Rights: You have the right to refuse treatment. You also have the right to change your mind, even after initially having agreed to have the treatment done. However, under this last option, if you wait until the last second to change your mind, you may be charged for the materials used up to that point.  Introduction: Medicine is not an Chief Strategy Officer. Everything in Medicine, including the lack of treatment(s), carries the potential for danger, harm, or loss (which is by definition: Risk). In Medicine, a complication is a secondary problem, condition, or disease that can aggravate an already existing one. All treatments carry the risk of possible complications. The fact that a side effects or complications occurs, does not imply that the treatment was conducted incorrectly. It must be clearly understood that these can happen even when everything is done following the highest safety standards.  No treatment: You can choose not to proceed with the proposed treatment alternative. The "PRO(s)"  would include: avoiding the risk of complications associated with the therapy. The "CON(s)" would include: not getting any of the treatment benefits. These benefits fall under one of three categories: diagnostic; therapeutic; and/or palliative. Diagnostic benefits include: getting information which can ultimately lead to improvement of the disease or symptom(s). Therapeutic benefits are those associated with the successful treatment of the disease. Finally, palliative benefits are those related to the decrease of the primary symptoms, without necessarily curing the condition (example: decreasing  the pain from a flare-up of a chronic condition, such as incurable terminal cancer).  General Risks and Complications: These are associated to most interventional treatments. They can occur alone, or in combination. They fall under one of the following six (6) categories: no benefit or worsening of symptoms; bleeding; infection; nerve damage; allergic reactions; and/or death. 1. No benefits or worsening of symptoms: In Medicine there are no guarantees, only probabilities. No healthcare provider can ever guarantee that a medical treatment will work, they can only state the probability that it may. Furthermore, there is always the possibility that the condition may worsen, either directly, or indirectly, as a consequence of the treatment. 2. Bleeding: This is more common if the patient is taking a blood thinner, either prescription or over the counter (example: Goody Powders, Fish oil, Aspirin, Garlic, etc.), or if suffering a condition associated with impaired coagulation (example: Hemophilia, cirrhosis of the liver, low platelet counts, etc.). However, even if you do not have one on these, it can still happen. If you have any of these conditions, or take one of these drugs, make sure to notify your treating physician. 3. Infection: This is more common in patients with a compromised immune system, either due to disease  (example: diabetes, cancer, human immunodeficiency virus [HIV], etc.), or due to medications or treatments (example: therapies used to treat cancer and rheumatological diseases). However, even if you do not have one on these, it can still happen. If you have any of these conditions, or take one of these drugs, make sure to notify your treating physician. 4. Nerve Damage: This is more common when the treatment is an invasive one, but it can also happen with the use of medications, such as those used in the treatment of cancer. The damage can occur to small secondary nerves, or to large primary ones, such as those in the spinal cord and brain. This damage may be temporary or permanent and it may lead to impairments that can range from temporary numbness to permanent paralysis and/or brain death. 5. Allergic Reactions: Any time a substance or material comes in contact with our body, there is the possibility of an allergic reaction. These can range from a mild skin rash (contact dermatitis) to a severe systemic reaction (anaphylactic reaction), which can result in death. 6. Death: In general, any medical intervention can result in death, most of the time due to an unforeseen complication. ____________________________________________________________________________________________  Dennis Bast were given prescriptions for Gabapentin, Oxycodone, and Narcan today.

## 2017-02-16 ENCOUNTER — Encounter: Payer: Self-pay | Admitting: Pain Medicine

## 2017-02-16 ENCOUNTER — Ambulatory Visit: Payer: BLUE CROSS/BLUE SHIELD | Attending: Pain Medicine | Admitting: Pain Medicine

## 2017-02-16 VITALS — BP 130/78 | HR 78 | Temp 98.2°F | Resp 16 | Ht 71.0 in | Wt 273.0 lb

## 2017-02-16 DIAGNOSIS — M47896 Other spondylosis, lumbar region: Secondary | ICD-10-CM | POA: Diagnosis not present

## 2017-02-16 DIAGNOSIS — M5442 Lumbago with sciatica, left side: Secondary | ICD-10-CM

## 2017-02-16 DIAGNOSIS — F1190 Opioid use, unspecified, uncomplicated: Secondary | ICD-10-CM | POA: Diagnosis not present

## 2017-02-16 DIAGNOSIS — M545 Low back pain: Secondary | ICD-10-CM | POA: Diagnosis not present

## 2017-02-16 DIAGNOSIS — M4696 Unspecified inflammatory spondylopathy, lumbar region: Secondary | ICD-10-CM

## 2017-02-16 DIAGNOSIS — M79604 Pain in right leg: Secondary | ICD-10-CM | POA: Insufficient documentation

## 2017-02-16 DIAGNOSIS — M47816 Spondylosis without myelopathy or radiculopathy, lumbar region: Secondary | ICD-10-CM

## 2017-02-16 DIAGNOSIS — Z79891 Long term (current) use of opiate analgesic: Secondary | ICD-10-CM | POA: Insufficient documentation

## 2017-02-16 DIAGNOSIS — M9983 Other biomechanical lesions of lumbar region: Secondary | ICD-10-CM

## 2017-02-16 DIAGNOSIS — G8929 Other chronic pain: Secondary | ICD-10-CM

## 2017-02-16 DIAGNOSIS — M792 Neuralgia and neuritis, unspecified: Secondary | ICD-10-CM | POA: Diagnosis not present

## 2017-02-16 DIAGNOSIS — M25562 Pain in left knee: Secondary | ICD-10-CM | POA: Insufficient documentation

## 2017-02-16 DIAGNOSIS — M8938 Hypertrophy of bone, other site: Secondary | ICD-10-CM | POA: Insufficient documentation

## 2017-02-16 DIAGNOSIS — M79605 Pain in left leg: Secondary | ICD-10-CM | POA: Insufficient documentation

## 2017-02-16 DIAGNOSIS — G894 Chronic pain syndrome: Secondary | ICD-10-CM | POA: Insufficient documentation

## 2017-02-16 DIAGNOSIS — M5441 Lumbago with sciatica, right side: Secondary | ICD-10-CM

## 2017-02-16 DIAGNOSIS — M48061 Spinal stenosis, lumbar region without neurogenic claudication: Secondary | ICD-10-CM | POA: Diagnosis not present

## 2017-02-16 DIAGNOSIS — M5136 Other intervertebral disc degeneration, lumbar region: Secondary | ICD-10-CM | POA: Diagnosis not present

## 2017-02-16 DIAGNOSIS — M4807 Spinal stenosis, lumbosacral region: Secondary | ICD-10-CM | POA: Diagnosis not present

## 2017-02-16 DIAGNOSIS — M961 Postlaminectomy syndrome, not elsewhere classified: Secondary | ICD-10-CM

## 2017-02-16 MED ORDER — OXYCODONE HCL 10 MG PO TABS: 10.0000 mg | ORAL_TABLET | Freq: Four times a day (QID) | ORAL | 0 refills | Status: DC | PRN

## 2017-02-16 MED ORDER — NALOXONE HCL 2 MG/2ML IJ SOSY: PREFILLED_SYRINGE | INTRAMUSCULAR | 1 refills | Status: DC

## 2017-02-16 MED ORDER — NALOXONE HCL 0.4 MG/ML IJ SOLN: 0.4000 mg | INTRAMUSCULAR | 0 refills | Status: DC | PRN

## 2017-02-16 MED ORDER — GABAPENTIN 300 MG PO CAPS: 300.0000 mg | ORAL_CAPSULE | Freq: Three times a day (TID) | ORAL | 0 refills | Status: DC

## 2017-02-16 NOTE — Progress Notes (Signed)
Safety precautions to be maintained throughout the outpatient stay will include: orient to surroundings, keep bed in low position, maintain call bell within reach at all times, provide assistance with transfer out of bed and ambulation.   Medication agreement signed.

## 2017-02-16 NOTE — Patient Instructions (Addendum)
____________________________________________________________________________________________  Preparing for Procedure with Sedation Instructions: . Oral Intake: Do not eat or drink anything for at least 8 hours prior to your procedure. . Transportation: Public transportation is not allowed. Bring an adult driver. The driver must be physically present in our waiting room before any procedure can be started. Marland Kitchen Physical Assistance: Bring an adult physically capable of assisting you, in the event you need help. This adult should keep you company at home for at least 6 hours after the procedure. . Blood Pressure Medicine: Take your blood pressure medicine with a sip of water the morning of the procedure. . Blood thinners:  . Diabetics on insulin: Notify the staff so that you can be scheduled 1st case in the morning. If your diabetes requires high dose insulin, take only  of your normal insulin dose the morning of the procedure and notify the staff that you have done so. . Preventing infections: Shower with an antibacterial soap the morning of your procedure. . Build-up your immune system: Take 1000 mg of Vitamin C with every meal (3 times a day) the day prior to your procedure. Marland Kitchen Antibiotics: Inform the staff if you have a condition or reason that requires you to take antibiotics before dental procedures. . Pregnancy: If you are pregnant, call and cancel the procedure. . Sickness: If you have a cold, fever, or any active infections, call and cancel the procedure. . Arrival: You must be in the facility at least 30 minutes prior to your scheduled procedure. . Children: Do not bring children with you. . Dress appropriately: Bring dark clothing that you would not mind if they get stained. . Valuables: Do not bring any jewelry or valuables. Procedure appointments are reserved for interventional treatments only. Marland Kitchen No Prescription Refills. . No medication changes will be discussed during procedure  appointments. . No disability issues will be discussed. ____________________________________________________________________________________________  ____________________________________________________________________________________________  Initial Gabapentin Titration  Medication used: Gabapentin (Generic Name) or Neurontin (Brand Name) 300 mg tablets/capsules  Reasons to stop increasing the dose:  Reason 1: You get good relief of symptoms, in which case there is no need to increase the daily dose any further.    Reason 2: You develop some side effects, such as sleeping all of the time, difficulty concentrating, or becoming disoriented, in which case you need to go down on the dose, to the prior level, where you were not experiencing any side effects. Stay on that dose longer, to allow more time for your body to get use it, before attempting to increase it again.   Steps: Step 1: Start by taking 1 (one) tablet at bedtime x 7 (seven) days.  Step 2: After being on 1 (one) tablet for 7 (seven) days, then increase it to 2 (two) tablets at bedtime for another 7 (seven) days.  Step 3: Next, after being on 2 (two) tablets at bedtime for 7 (seven) days, then increase it to 3 (three) tablets at bedtime, and stay on that dose until you see your doctor.  Reasons to stop increasing the dose: Reason 1: You get good relief of symptoms, in which case there is no need to increase the daily dose any further.  Reason 2: You develop some side effects, such as sleeping all of the time, difficulty concentrating, or becoming disoriented, in which case you need to go down on the dose, to the prior level, where you were not experiencing any side effects. Stay on that dose longer, to allow more time for your  body to get use it, before attempting to increase it again.  Endpoint: Once you have reached the maximum dose you can tolerate without side-effects, contact your physician so as to evaluate the results of  the regimen.   Questions: Feel free to contact us for any questions or problems at [phone] ____________________________________________________________________________________________  ____________________________________________________________________________________________  Risk(s) and Possible Complications  Patient Responsibilities: It is important that you read this as it is part of your informed consent. It is our duty to inform you of the risks and possible complications associated with treatments offered to you. It is your responsibility as a patient to read this and to ask questions about anything that is not clear or that you believe was not covered in this document.  Patient's Rights: You have the right to refuse treatment. You also have the right to change your mind, even after initially having agreed to have the treatment done. However, under this last option, if you wait until the last second to change your mind, you may be charged for the materials used up to that point.  Introduction: Medicine is not an Visual merchandiserexact science. Everything in Medicine, including the lack of treatment(s), carries the potential for danger, harm, or loss (which is by definition: Risk). In Medicine, a complication is a secondary problem, condition, or disease that can aggravate an already existing one. All treatments carry the risk of possible complications. The fact that a side effects or complications occurs, does not imply that the treatment was conducted incorrectly. It must be clearly understood that these can happen even when everything is done following the highest safety standards.  No treatment: You can choose not to proceed with the proposed treatment alternative. The "PRO(s)" would include: avoiding the risk of complications associated with the therapy. The "CON(s)" would include: not getting any of the treatment benefits. These benefits fall under one of three categories: diagnostic; therapeutic;  and/or palliative. Diagnostic benefits include: getting information which can ultimately lead to improvement of the disease or symptom(s). Therapeutic benefits are those associated with the successful treatment of the disease. Finally, palliative benefits are those related to the decrease of the primary symptoms, without necessarily curing the condition (example: decreasing the pain from a flare-up of a chronic condition, such as incurable terminal cancer).  General Risks and Complications: These are associated to most interventional treatments. They can occur alone, or in combination. They fall under one of the following six (6) categories: no benefit or worsening of symptoms; bleeding; infection; nerve damage; allergic reactions; and/or death. 1. No benefits or worsening of symptoms: In Medicine there are no guarantees, only probabilities. No healthcare provider can ever guarantee that a medical treatment will work, they can only state the probability that it may. Furthermore, there is always the possibility that the condition may worsen, either directly, or indirectly, as a consequence of the treatment. 2. Bleeding: This is more common if the patient is taking a blood thinner, either prescription or over the counter (example: Goody Powders, Fish oil, Aspirin, Garlic, etc.), or if suffering a condition associated with impaired coagulation (example: Hemophilia, cirrhosis of the liver, low platelet counts, etc.). However, even if you do not have one on these, it can still happen. If you have any of these conditions, or take one of these drugs, make sure to notify your treating physician. 3. Infection: This is more common in patients with a compromised immune system, either due to disease (example: diabetes, cancer, human immunodeficiency virus [HIV], etc.), or due to  medications or treatments (example: therapies used to treat cancer and rheumatological diseases). However, even if you do not have one on these,  it can still happen. If you have any of these conditions, or take one of these drugs, make sure to notify your treating physician. 4. Nerve Damage: This is more common when the treatment is an invasive one, but it can also happen with the use of medications, such as those used in the treatment of cancer. The damage can occur to small secondary nerves, or to large primary ones, such as those in the spinal cord and brain. This damage may be temporary or permanent and it may lead to impairments that can range from temporary numbness to permanent paralysis and/or brain death. 5. Allergic Reactions: Any time a substance or material comes in contact with our body, there is the possibility of an allergic reaction. These can range from a mild skin rash (contact dermatitis) to a severe systemic reaction (anaphylactic reaction), which can result in death. 6. Death: In general, any medical intervention can result in death, most of the time due to an unforeseen complication. ____________________________________________________________________________________________  Bonita QuinYou were given prescriptions for Gabapentin, Oxycodone, and Narcan today.

## 2017-03-08 ENCOUNTER — Encounter: Payer: Self-pay | Admitting: Pain Medicine

## 2017-03-08 ENCOUNTER — Ambulatory Visit
Admission: RE | Admit: 2017-03-08 | Discharge: 2017-03-08 | Disposition: A | Discharge status: Home / Self Care | Payer: BLUE CROSS/BLUE SHIELD | Source: Ambulatory Visit | Attending: Pain Medicine | Admitting: Pain Medicine

## 2017-03-08 ENCOUNTER — Ambulatory Visit (HOSPITAL_BASED_OUTPATIENT_CLINIC_OR_DEPARTMENT_OTHER): Payer: BLUE CROSS/BLUE SHIELD | Admitting: Pain Medicine

## 2017-03-08 VITALS — BP 120/79 | HR 72 | Temp 97.1°F | Resp 15 | Ht 71.0 in | Wt 277.0 lb

## 2017-03-08 DIAGNOSIS — M5442 Lumbago with sciatica, left side: Secondary | ICD-10-CM | POA: Insufficient documentation

## 2017-03-08 DIAGNOSIS — M47816 Spondylosis without myelopathy or radiculopathy, lumbar region: Secondary | ICD-10-CM

## 2017-03-08 DIAGNOSIS — M4696 Unspecified inflammatory spondylopathy, lumbar region: Secondary | ICD-10-CM | POA: Insufficient documentation

## 2017-03-08 DIAGNOSIS — M5136 Other intervertebral disc degeneration, lumbar region: Secondary | ICD-10-CM | POA: Diagnosis not present

## 2017-03-08 DIAGNOSIS — M47896 Other spondylosis, lumbar region: Secondary | ICD-10-CM | POA: Insufficient documentation

## 2017-03-08 DIAGNOSIS — M5441 Lumbago with sciatica, right side: Secondary | ICD-10-CM

## 2017-03-08 DIAGNOSIS — G8929 Other chronic pain: Secondary | ICD-10-CM | POA: Insufficient documentation

## 2017-03-08 MED ORDER — FENTANYL CITRATE (PF) 100 MCG/2ML IJ SOLN
25.0000 ug | INTRAMUSCULAR | Status: DC | PRN
  Administered 2017-03-08: 100 ug via INTRAVENOUS
  Filled 2017-03-08: qty 2

## 2017-03-08 MED ORDER — MIDAZOLAM HCL 5 MG/5ML IJ SOLN
1.0000 mg | INTRAMUSCULAR | Status: DC | PRN
  Administered 2017-03-08: 3 mg via INTRAVENOUS
  Filled 2017-03-08: qty 5

## 2017-03-08 MED ORDER — ROPIVACAINE HCL 2 MG/ML IJ SOLN
9.0000 mL | Freq: Once | INTRAMUSCULAR | Status: AC
  Administered 2017-03-08: 10 mL via PERINEURAL
  Filled 2017-03-08: qty 10

## 2017-03-08 MED ORDER — LIDOCAINE HCL (PF) 1.5 % IJ SOLN
20.0000 mL | Freq: Once | INTRAMUSCULAR | Status: AC
  Administered 2017-03-08: 20 mL
  Filled 2017-03-08: qty 20

## 2017-03-08 MED ORDER — TRIAMCINOLONE ACETONIDE 40 MG/ML IJ SUSP
40.0000 mg | Freq: Once | INTRAMUSCULAR | Status: AC
  Administered 2017-03-08: 40 mg
  Filled 2017-03-08: qty 1

## 2017-03-08 MED ORDER — LIDOCAINE HCL (PF) 1.5 % IJ SOLN
20.0000 mL | Freq: Once | INTRAMUSCULAR | Status: DC
  Filled 2017-03-08: qty 20

## 2017-03-08 MED ORDER — LACTATED RINGERS IV SOLN
1000.0000 mL | Freq: Once | INTRAVENOUS | Status: AC
  Administered 2017-03-08: 1000 mL via INTRAVENOUS

## 2017-03-08 NOTE — Patient Instructions (Addendum)
____________________________________________________________________________________________  Post-Procedure instructions Instructions:  Apply ice: Fill a plastic sandwich bag with crushed ice. Cover it with a small towel and apply to injection site. Apply for 15 minutes then remove x 15 minutes. Repeat sequence on day of procedure, until you go to bed. The purpose is to minimize swelling and discomfort after procedure.  Apply heat: Apply heat to procedure site starting the day following the procedure. The purpose is to treat any soreness and discomfort from the procedure.  Food intake: Start with clear liquids (like water) and advance to regular food, as tolerated.   Physical activities: Keep activities to a minimum for the first 8 hours after the procedure.   Driving: If you have received any sedation, you are not allowed to drive for 24 hours after your procedure.  Blood thinner: Restart your blood thinner 6 hours after your procedure. (Only for those taking blood thinners)  Insulin: As soon as you can eat, you may resume your normal dosing schedule. (Only for those taking insulin)  Infection prevention: Keep procedure site clean and dry.  Post-procedure Pain Diary: Extremely important that this be done correctly and accurately. Recorded information will be used to determine the next step in treatment.  Pain evaluated is that of treated area only. Do not include pain from an untreated area.  Complete every hour, on the hour, for the initial 8 hours. Set an alarm to help you do this part accurately.  Do not go to sleep and have it completed later. It will not be accurate.  Follow-up appointment: Keep your follow-up appointment after the procedure. Usually 2 weeks for most procedures. (6 weeks in the case of radiofrequency.) Bring you pain diary.  Expect:  From numbing medicine (AKA: Local Anesthetics): Numbness or decrease in pain.  Onset: Full effect within 15 minutes of  injected.  Duration: It will depend on the type of local anesthetic used. On the average, 1 to 8 hours.   From steroids: Decrease in swelling or inflammation. Once inflammation is improved, relief of the pain will follow.  Onset of benefits: Depends on the amount of swelling present. The more swelling, the longer it will take for the benefits to be seen. In some cases, up to 10 days.  Duration: Steroids will stay in the system x 2 weeks. Duration of benefits will depend on multiple posibilities including persistent irritating factors.  From procedure: Some discomfort is to be expected once the numbing medicine wears off. This should be minimal if ice and heat are applied as instructed. Call if:  You experience numbness and weakness that gets worse with time, as opposed to wearing off.  New onset bowel or bladder incontinence. (Spinal procedures only)  Emergency Numbers:  Durning business hours (Monday - Thursday, 8:00 AM - 4:00 PM) (Friday, 9:00 AM - 12:00 Noon): [phone]  After hours: [phone] ____________________________________________________________________________________________  Pain Management Discharge Instructions  General Discharge Instructions :  If you need to reach your doctor call: Monday-Friday 8:00 am - 4:00 pm at [phone] or toll free [phone].  After clinic hours [phone] to have operator reach doctor.  Bring all of your medication bottles to all your appointments in the pain clinic.  To cancel or reschedule your appointment with Pain Management please remember to call 24 hours in advance to avoid a fee.  Refer to the educational materials which you have been given on: General Risks, I had my Procedure. Discharge Instructions, Post Sedation.  Post Procedure Instructions:  The drugs you   were given will stay in your system until tomorrow, so for the next 24 hours you should not drive, make any legal decisions or drink any alcoholic  beverages.  You may eat anything you prefer, but it is better to start with liquids then soups and crackers, and gradually work up to solid foods.  Please notify your doctor immediately if you have any unusual bleeding, trouble breathing or pain that is not related to your normal pain.  Depending on the type of procedure that was done, some parts of your body may feel week and/or numb.  This usually clears up by tonight or the next day.  Walk with the use of an assistive device or accompanied by an adult for the 24 hours.  You may use ice on the affected area for the first 24 hours.  Put ice in a Ziploc bag and cover with a towel and place against area 15 minutes on 15 minutes off.  You may switch to heat after 24 hours. Facet Joint Block, Care After Refer to this sheet in the next few weeks. These instructions provide you with information about caring for yourself after your procedure. Your health care provider may also give you more specific instructions. Your treatment has been planned according to current medical practices, but problems sometimes occur. Call your health care provider if you have any problems or questions after your procedure. What can I expect after the procedure? After the procedure, it is common to have:  Some tenderness over the injection sites for 2 days after the procedure.  A temporary increase in blood sugar if you have diabetes.  Follow these instructions at home:  Keep track of the amount of pain relief you feel and how long it lasts.  Take over-the-counter and prescription medicines only as told by your health care provider. You may need to limit pain medicine within the first 4-6 hours after the procedure.  Remove your bandages (dressings) the morning after the procedure.  For the first 24 hours after the procedure: ? Do not apply heat near or over the injection sites. ? Do not take a bath or soak in water, such as in a pool or lake. ? Do not drive or  operate heavy machinery unless approved by your health care provider. ? Avoid activities that require a lot of energy.  If the injection site is tender, try applying ice to the area. To do this: ? Put ice in a plastic bag. ? Place a towel between your skin and the bag. ? Leave the ice on for 20 minutes, 2-3 times a day.  Keep all follow-up visits as told by your health care provider. This is important. Contact a health care provider if:  Fluid is coming from an injection site.  There is significant bleeding or swelling at an injection site.  You have diabetes and your blood sugar is above 180 mg/dL. Get help right away if:  You have a fever.  You have worsening pain or swelling around an injection site.  There are red streaks around an injection site.  You develop severe pain that is not controlled by your medicines.  You develop a headache, stiff neck, nausea, or vomiting.  Your eyes become very sensitive to light.  You have weakness, paralysis, or tingling in your arms or legs that was not present before the procedure.  You have difficulty urinating or breathing. This information is not intended to replace advice given to you by your health care   provider. Make sure you discuss any questions you have with your health care provider. Document Released: 06/21/2012 Document Revised: 11/19/2015 Document Reviewed: 03/31/2015 [employer] Interactive Patient Education  2018 [employer]  Facet Joint Block The facet joints connect the bones of the spine (vertebrae). They make it possible for you to bend, twist, and make other movements with your spine. They also keep you from bending too far, twisting too far, and making other excessive movements. A facet joint block is a procedure where a numbing medicine (anesthetic) is injected into a facet joint. Often, a type of anti-inflammatory medicine called a steroid is also injected. A facet joint block may be done to diagnose neck or back  pain. If the pain gets better after a facet joint block, it means the pain is probably coming from the facet joint. If the pain does not get better, it means the pain is probably not coming from the facet joint. A facet joint block may also be done to relieve neck or back pain caused by an inflamed facet joint. A facet joint block is only done to relieve pain if the pain does not improve with other methods, such as medicine, exercise programs, and physical therapy. Tell a health care provider about:  Any allergies you have.  All medicines you are taking, including vitamins, herbs, eye drops, creams, and over-the-counter medicines.  Any problems you or family members have had with anesthetic medicines.  Any blood disorders you have.  Any surgeries you have had.  Any medical conditions you have.  Whether you are pregnant or may be pregnant. What are the risks? Generally, this is a safe procedure. However, problems may occur, including:  Bleeding.  Injury to a nerve near the injection site.  Pain at the injection site.  Weakness or numbness in areas controlled by nerves near the injection site.  Infection.  Temporary fluid retention.  Allergic reactions to medicines or dyes.  Injury to other structures or organs near the injection site.  What happens before the procedure?  Follow instructions from your health care provider about eating or drinking restrictions.  Ask your health care provider about: ? Changing or stopping your regular medicines. This is especially important if you are taking diabetes medicines or blood thinners. ? Taking medicines such as aspirin and ibuprofen. These medicines can thin your blood. Do not take these medicines before your procedure if your health care provider instructs you not to.  Do not take any new dietary supplements or medicines without asking your health care provider first.  Plan to have someone take you home after the procedure. What  happens during the procedure?  You may need to remove your clothing and dress in an open-back gown.  The procedure will be done while you are lying on an X-ray table. You will most likely be asked to lie on your stomach, but you may be asked to lie in a different position if an injection will be made in your neck.  Machines will be used to monitor your oxygen levels, heart rate, and blood pressure.  If an injection will be made in your neck, an IV tube will be inserted into one of your veins. Fluids and medicine will flow directly into your body through the IV tube.  The area over the facet joint where the injection will be made will be cleaned with soap. The surrounding skin will be covered with clean drapes.  A numbing medicine (local anesthetic) will be applied to your skin.   Your skin may sting or burn for a moment.  A video X-ray machine (fluoroscopy) will be used to locate the joint. In some cases, a CT scan may be used.  A contrast dye may be injected into the facet joint area to help locate the joint.  When the joint is located, an anesthetic will be injected into the joint through the needle.  Your health care provider will ask you whether you feel pain relief. If you do feel relief, a steroid may be injected to provide pain relief for a longer period of time. If you do not feel relief or feel only partial relief, additional injections of an anesthetic may be made in other facet joints.  The needle will be removed.  Your skin will be cleaned.  A bandage (dressing) will be applied over each injection site. The procedure may vary among health care providers and hospitals. What happens after the procedure?  You will be observed for 15-30 minutes before being allowed to go home. This information is not intended to replace advice given to you by your health care provider. Make sure you discuss any questions you have with your health care provider. Document Released: 11/24/2006  Document Revised: 08/06/2015 Document Reviewed: 03/31/2015 [employer] Interactive Patient Education  2018 [employer]  

## 2017-03-08 NOTE — Progress Notes (Signed)
Patient's Name: Nathan Lambert.  MRN: [id number]  Referring Provider: Smith Robert, MD  DOB: 09-27-1980  PCP: Smith Robert, MD  DOS: 03/08/2017  Note by: Oswaldo Done, MD  Service setting: Ambulatory outpatient  Specialty: Interventional Pain Management  Patient type: Established  Location: [facility] (AMB) Pain Management Facility  Visit type: Interventional Procedure   Primary Reason for Visit: Interventional Pain Management Treatment. CC: Back Pain (lower)  Procedure:  Anesthesia, Analgesia, Anxiolysis:  Type: Diagnostic Medial Branch Facet Block Region: Lumbar Level: L2, L3, L4, L5, & S1 Medial Branch Level(s) Laterality: Bilateral  Type: Local Anesthesia with Moderate (Conscious) Sedation Local Anesthetic: Lidocaine 1% Route: Intravenous (IV) IV Access: Secured Sedation: Meaningful verbal contact was maintained at all times during the procedure  Indication(s): Analgesia and Anxiety  Indications: 1. Lumbar facet syndrome (Bilateral) (R>L)   2. Lumbar facet hypertrophy (Bilateral)   3. DDD (degenerative disc disease), lumbar   4. Chronic low back pain (Primary Area of Pain) (Bilateral) (R>L)    Pain Score: Pre-procedure: 8 /10 Post-procedure: 0-No pain/10  Pre-op Assessment:  Mr. Chiasson is a 36 y.o. (year old), male patient, seen today for interventional treatment. He  has a past surgical history that includes Fracture surgery (Left); Spine surgery (Right, 06/16/2015); and Back surgery. Mr. Higashi has a current medication list which includes the following prescription(s): ergocalciferol, gabapentin, loratadine, montelukast, naloxone, oxycodone hcl, and oxycodone-acetaminophen, and the following Facility-Administered Medications: fentanyl, lidocaine, and midazolam. His primarily concern today is the Back Pain (lower)  Initial Vital Signs: There were no vitals taken for this visit. BMI: Estimated body mass index is 38.63 kg/m as calculated from the  following:   Height as of this encounter: 5\' 11"  (1.803 m).   Weight as of this encounter: 277 lb (125.6 kg).  Risk Assessment: Allergies: Reviewed. He is allergic to bactrim [sulfamethoxazole-trimethoprim].  Allergy Precautions: None required Coagulopathies: Reviewed. None identified.  Blood-thinner therapy: None at this time Active Infection(s): Reviewed. None identified. Mr. Ing is afebrile  Site Confirmation: Mr. Chavarin was asked to confirm the procedure and laterality before marking the site Procedure checklist: Completed Consent: Before the procedure and under the influence of no sedative(s), amnesic(s), or anxiolytics, the patient was informed of the treatment options, risks and possible complications. To fulfill our ethical and legal obligations, as recommended by the American Medical Association's Code of Ethics, I have informed the patient of my clinical impression; the nature and purpose of the treatment or procedure; the risks, benefits, and possible complications of the intervention; the alternatives, including doing nothing; the risk(s) and benefit(s) of the alternative treatment(s) or procedure(s); and the risk(s) and benefit(s) of doing nothing. The patient was provided information about the general risks and possible complications associated with the procedure. These may include, but are not limited to: failure to achieve desired goals, infection, bleeding, organ or nerve damage, allergic reactions, paralysis, and death. In addition, the patient was informed of those risks and complications associated to Spine-related procedures, such as failure to decrease pain; infection (i.e.: Meningitis, epidural or intraspinal abscess); bleeding (i.e.: epidural hematoma, subarachnoid hemorrhage, or any other type of intraspinal or peri-dural bleeding); organ or nerve damage (i.e.: Any type of peripheral nerve, nerve root, or spinal cord injury) with subsequent damage to sensory,  motor, and/or autonomic systems, resulting in permanent pain, numbness, and/or weakness of one or several areas of the body; allergic reactions; (i.e.: anaphylactic reaction); and/or death. Furthermore, the patient was informed of those risks and complications associated with the  medications. These include, but are not limited to: allergic reactions (i.e.: anaphylactic or anaphylactoid reaction(s)); adrenal axis suppression; blood sugar elevation that in diabetics may result in ketoacidosis or comma; water retention that in patients with history of congestive heart failure may result in shortness of breath, pulmonary edema, and decompensation with resultant heart failure; weight gain; swelling or edema; medication-induced neural toxicity; particulate matter embolism and blood vessel occlusion with resultant organ, and/or nervous system infarction; and/or aseptic necrosis of one or more joints. Finally, the patient was informed that Medicine is not an exact science; therefore, there is also the possibility of unforeseen or unpredictable risks and/or possible complications that may result in a catastrophic outcome. The patient indicated having understood very clearly. We have given the patient no guarantees and we have made no promises. Enough time was given to the patient to ask questions, all of which were answered to the patient's satisfaction. Mr. Schifano has indicated that he wanted to continue with the procedure. Attestation: I, the ordering provider, attest that I have discussed with the patient the benefits, risks, side-effects, alternatives, likelihood of achieving goals, and potential problems during recovery for the procedure that I have provided informed consent. Date: 03/08/2017; Time: 7:59 AM  Pre-Procedure Preparation:  Monitoring: As per clinic protocol. Respiration, ETCO2, SpO2, BP, heart rate and rhythm monitor placed and checked for adequate function Safety Precautions: Patient was  assessed for positional comfort and pressure points before starting the procedure. Time-out: I initiated and conducted the "Time-out" before starting the procedure, as per protocol. The patient was asked to participate by confirming the accuracy of the "Time Out" information. Verification of the correct person, site, and procedure were performed and confirmed by me, the nursing staff, and the patient. "Time-out" conducted as per Joint Commission's Universal Protocol (UP.01.01.01). "Time-out" Date & Time: 03/08/2017; 1007 hrs.  Description of Procedure Process:   Position: Prone Target Area: For Lumbar Facet blocks, the target is the groove formed by the junction of the transverse process and superior articular process. For the L5 dorsal ramus, the target is the notch between superior articular process and sacral ala. For the S1 dorsal ramus, the target is the superior and lateral edge of the posterior S1 Sacral foramen. Approach: Paramedial approach. Area Prepped: Entire Posterior Lumbosacral Region Prepping solution: ChloraPrep (2% chlorhexidine gluconate and 70% isopropyl alcohol) Safety Precautions: Aspiration looking for blood return was conducted prior to all injections. At no point did we inject any substances, as a needle was being advanced. No attempts were made at seeking any paresthesias. Safe injection practices and needle disposal techniques used. Medications properly checked for expiration dates. SDV (single dose vial) medications used. Description of the Procedure: Protocol guidelines were followed. The patient was placed in position over the fluoroscopy table. The target area was identified and the area prepped in the usual manner. Skin desensitized using vapocoolant spray. Skin & deeper tissues infiltrated with local anesthetic. Appropriate amount of time allowed to pass for local anesthetics to take effect. The procedure needle was introduced through the skin, ipsilateral to the reported  pain, and advanced to the target area. Employing the "Medial Branch Technique", the needles were advanced to the angle made by the superior and medial portion of the transverse process, and the lateral and inferior portion of the superior articulating process of the targeted vertebral bodies. This area is known as "Burton's Eye" or the "Eye of the [place] Dog". A procedure needle was introduced through the skin, and this time advanced to  the angle made by the superior and medial border of the sacral ala, and the lateral border of the S1 vertebral body. This last needle was later repositioned at the superior and lateral border of the posterior S1 foramen. Negative aspiration confirmed. Solution injected in intermittent fashion, asking for systemic symptoms every 0.5cc of injectate. The needles were then removed and the area cleansed, making sure to leave some of the prepping solution back to take advantage of its long term bactericidal properties.   Illustration of the posterior view of the lumbar spine and the posterior neural structures. Laminae of L2 through S1 are labeled. DPRL5, dorsal primary ramus of L5; DPRS1, dorsal primary ramus of S1; DPR3, dorsal primary ramus of L3; FJ, facet (zygapophyseal) joint L3-L4; I, inferior articular process of L4; LB1, lateral branch of dorsal primary ramus of L1; IAB, inferior articular branches from L3 medial branch (supplies L4-L5 facet joint); IBP, intermediate branch plexus; MB3, medial branch of dorsal primary ramus of L3; NR3, third lumbar nerve root; S, superior articular process of L5; SAB, superior articular branches from L4 (supplies L4-5 facet joint also); TP3, transverse process of L3.  Vitals:   03/08/17 1025 03/08/17 1035 03/08/17 1045 03/08/17 1055  BP: 114/65 121/76 127/77 120/79  Pulse:      Resp: 11 13 10 15   Temp:  (!) 97.1 F (36.2 C)    SpO2: 100% 100% 100% 100%  Weight:      Height:        Start Time: 1009 hrs. End Time: 1025  hrs. Materials:  Needle(s) Type: Regular needle Gauge: 22G Length: 5-in Medication(s): We administered lactated ringers, midazolam, fentaNYL, triamcinolone acetonide, ropivacaine (PF) 2 mg/mL (0.2%), lidocaine, triamcinolone acetonide, and ropivacaine (PF) 2 mg/mL (0.2%). Please see chart orders for dosing details.  Imaging Guidance (Spinal):  Type of Imaging Technique: Fluoroscopy Guidance (Spinal) Indication(s): Assistance in needle guidance and placement for procedures requiring needle placement in or near specific anatomical locations not easily accessible without such assistance. Exposure Time: Please see nurses notes. Contrast: None used. Fluoroscopic Guidance: I was personally present during the use of fluoroscopy. "Tunnel Vision Technique" used to obtain the best possible view of the target area. Parallax error corrected before commencing the procedure. "Direction-depth-direction" technique used to introduce the needle under continuous pulsed fluoroscopy. Once target was reached, antero-posterior, oblique, and lateral fluoroscopic projection used confirm needle placement in all planes. Images permanently stored in EMR. Interpretation: No contrast injected. I personally interpreted the imaging intraoperatively. Adequate needle placement confirmed in multiple planes. Permanent images saved into the patient's record.  Antibiotic Prophylaxis:  Indication(s): None identified Antibiotic given: None  Post-operative Assessment:  EBL: None Complications: No immediate post-treatment complications observed by team, or reported by patient. Note: The patient tolerated the entire procedure well. A repeat set of vitals were taken after the procedure and the patient was kept under observation following institutional policy, for this type of procedure. Post-procedural neurological assessment was performed, showing return to baseline, prior to discharge. The patient was provided with post-procedure  discharge instructions, including a section on how to identify potential problems. Should any problems arise concerning this procedure, the patient was given instructions to immediately contact us, at any time, without hesitation. In any case, we plan to contact the patient by telephone for a follow-up status report regarding this interventional procedure. Comments:  No additional relevant information.  Plan of Care  Disposition: Discharge home  Discharge Date & Time: 03/08/2017; 1057 hrs.  Physician-requested Follow-up:  Return for  post-procedure eval by Dr. Laban Emperor in 2 weeks.  New Prescriptions   No medications on file   Future Appointments Date Time Provider Department Center  03/14/2017 10:30 AM Barbette Merino, NP [facility] None  04/06/2017 10:15 AM Delano Metz, MD [facility] None    Imaging Orders     DG C-Arm 1-60 Min-No Report  Procedure Orders     LUMBAR FACET(MEDIAL BRANCH NERVE BLOCK) MBNB  Medications ordered for procedure: Meds ordered this encounter  Medications  . lactated ringers infusion 1,000 mL  . midazolam (VERSED) 5 MG/5ML injection 1-2 mg    Make sure Flumazenil is available in the pyxis when using this medication. If oversedation occurs, administer 0.2 mg IV over 15 sec. If after 45 sec no response, administer 0.2 mg again over 1 min; may repeat at 1 min intervals; not to exceed 4 doses (1 mg)  . fentaNYL (SUBLIMAZE) injection 25-50 mcg    Make sure Narcan is available in the pyxis when using this medication. In the event of respiratory depression (RR< 8/min): Titrate NARCAN (naloxone) in increments of 0.1 to 0.2 mg IV at 2-3 minute intervals, until desired degree of reversal.  . lidocaine 1.5 % injection 20 mL    From block tray  . triamcinolone acetonide (KENALOG-40) injection 40 mg  . ropivacaine (PF) 2 mg/mL (0.2%) (NAROPIN) injection 9 mL  . lidocaine 1.5 % injection 20 mL  . triamcinolone acetonide (KENALOG-40) injection 40 mg  . ropivacaine  (PF) 2 mg/mL (0.2%) (NAROPIN) injection 9 mL   Medications administered: We administered lactated ringers, midazolam, fentaNYL, triamcinolone acetonide, ropivacaine (PF) 2 mg/mL (0.2%), lidocaine, triamcinolone acetonide, and ropivacaine (PF) 2 mg/mL (0.2%).  See the medical record for exact dosing, route, and time of administration.  Primary Care Physician: Smith Robert, MD Location: [facility] Note by: Oswaldo Done, MD Date: 03/08/2017; Time: 11:35 AM  Disclaimer:  Medicine is not an exact science. The only guarantee in medicine is that nothing is guaranteed. It is important to note that the decision to proceed with this intervention was based on the information collected from the patient. The Data and conclusions were drawn from the patient's questionnaire, the interview, and the physical examination. Because the information was provided in large part by the patient, it cannot be guaranteed that it has not been purposely or unconsciously manipulated. Every effort has been made to obtain as much relevant data as possible for this evaluation. It is important to note that the conclusions that lead to this procedure are derived in large part from the available data. Always take into account that the treatment will also be dependent on availability of resources and existing treatment guidelines, considered by other Pain Management Practitioners as being common knowledge and practice, at the time of the intervention. For Medico-Legal purposes, it is also important to point out that variation in procedural techniques and pharmacological choices are the acceptable norm. The indications, contraindications, technique, and results of the above procedure should only be interpreted and judged by a Board-Certified Interventional Pain Specialist with extensive familiarity and expertise in the same exact procedure and technique.

## 2017-03-08 NOTE — Progress Notes (Signed)
Safety precautions to be maintained throughout the outpatient stay will include: orient to surroundings, keep bed in low position, maintain call bell within reach at all times, provide assistance with transfer out of bed and ambulation.  

## 2017-03-09 ENCOUNTER — Telehealth: Payer: Self-pay | Admitting: *Deleted

## 2017-03-09 NOTE — Telephone Encounter (Signed)
Attempted to call for post procedure follow-up. No answer. 

## 2017-03-10 NOTE — Progress Notes (Signed)
Patient's Name: Nathan Lambert.  MRN: [id number]  Referring Provider: Vidal Schwalbe, MD  DOB: 1981-07-06  PCP: Vidal Schwalbe, MD  DOS: 03/14/2017  Note by: Vevelyn Francois NP  Service setting: Ambulatory outpatient  Specialty: Interventional Pain Management  Location: [facility] (AMB) Pain Management Facility    Patient type: Established    Primary Reason(s) for Visit: Encounter for prescription drug management. (Level of risk: moderate)  CC: Back Pain (lower)  HPI  Nathan Lambert is a 36 y.o. year old, male patient, who comes today for a medication management evaluation. He has Intractable episodic cluster headache; Chronic pain syndrome; Degeneration of lumbar or lumbosacral intervertebral disc (L4-L5); Chronic low back pain (Primary Area of Pain) (Bilateral) (R>L); Sacroiliac joint pain; Vitamin D deficiency; DDD (degenerative disc disease), lumbar; Chronic knee pain (Secondary Area of pain) (Left); Lumbar foraminal stenosis(L4-5 and L5-S1) (Bilateral); Failed back surgical syndrome; Chronic lower extremity pain (Bilateral) (L>R); Long term (current) use of opiate analgesic; Long term prescription opiate use; Opiate use (60 MME/Day); Lumbar facet hypertrophy (Bilateral); Lumbar facet syndrome (Bilateral) (R>L); Neurogenic pain; and Constipation on his problem list. His primarily concern today is the Back Pain (lower)  Pain Assessment: Location: Lower Back Radiating: stays in lower back Onset: More than a month ago Duration: Chronic pain Quality: Aching, Constant Severity: 2 /10 (self-reported pain score)  Note: Reported level is compatible with observation.                   Effect on ADL: pace self Timing: Constant Modifying factors: procedure, medicine  Nathan Lambert was last scheduled for an appointment on 12/27/2016 for medication management. During today's appointment we reviewed Nathan Lambert chronic pain status, as well as his outpatient medication regimen.  The patient   reports that he does not use drugs. His body mass index is 38.63 kg/m.  Further details on both, my assessment(s), as well as the proposed treatment plan, please see below.  Controlled Substance Pharmacotherapy Assessment REMS (Risk Evaluation and Mitigation Strategy)  Analgesic: oxycodone 10 mg 4 times daily MME/day:60 mg/day.  Lona Millard, RN  03/14/2017 10:31 AM  Sign at close encounter Nursing Pain Medication Assessment:  Safety precautions to be maintained throughout the outpatient stay will include: orient to surroundings, keep bed in low position, maintain call bell within reach at all times, provide assistance with transfer out of bed and ambulation.  Medication Inspection Compliance: Pill count conducted under aseptic conditions, in front of the patient. Neither the pills nor the bottle was removed from the patient's sight at any time. Once count was completed pills were immediately returned to the patient in their original bottle.  Medication: Oxycodone IR Pill/Patch Count: 18 of 120 pills remain Pill/Patch Appearance: Markings consistent with prescribed medication Bottle Appearance: Standard pharmacy container. Clearly labeled. Filled Date: 08 / 01 / 2018 Last Medication intake:  Today   Pharmacokinetics: Liberation and absorption (onset of action): WNL Distribution (time to peak effect): WNL Metabolism and excretion (duration of action): WNL         Pharmacodynamics: Desired effects: Analgesia: Nathan Lambert reports >50% benefit. Functional ability: Patient reports that medication allows him to accomplish basic ADLs Clinically meaningful improvement in function (CMIF): Sustained CMIF goals met Perceived effectiveness: Described as relatively effective, allowing for increase in activities of daily living (ADL) Undesirable effects: Side-effects or Adverse reactions: None reported Monitoring: [place] PMP: Online review of the past 78-monthperiod conducted. Compliant with  practice rules and regulations List of all UDS  test(s) done:  Lab Results  Component Value Date   SUMMARY FINAL 12/27/2016   Last UDS on record: Summary  Date Value Ref Range Status  12/27/2016 FINAL  Final    Comment:    ==================================================================== [facility] COMP DRUG ANALYSIS,UR ==================================================================== Test                             Result       Flag       Units Drug Present and Declared for Prescription Verification   Oxycodone                      300          EXPECTED   ng/mg creat   Oxymorphone                    493          EXPECTED   ng/mg creat   Noroxycodone                   723          EXPECTED   ng/mg creat   Noroxymorphone                 129          EXPECTED   ng/mg creat    Sources of oxycodone are scheduled prescription medications.    Oxymorphone, noroxycodone, and noroxymorphone are expected    metabolites of oxycodone. Oxymorphone is also available as a    scheduled prescription medication.   Acetaminophen                  PRESENT      EXPECTED Drug Absent but Declared for Prescription Verification   Gabapentin                     Not Detected UNEXPECTED ==================================================================== Test                      Result    Flag   Units      Ref Range   Creatinine              257              mg/dL      >=20 ==================================================================== Declared Medications:  The flagging and interpretation on this report are based on the  following declared medications.  Unexpected results may arise from  inaccuracies in the declared medications.  **Note: The testing scope of this panel includes these medications:  Gabapentin  Oxycodone (Percocet)  **Note: The testing scope of this panel does not include small to  moderate amounts of these reported medications:  Acetaminophen (Percocet)  **Note: The testing  scope of this panel does not include following  reported medications:  Loratadine (Claritin) ==================================================================== For clinical consultation, please call [phone]. ====================================================================    UDS interpretation: Compliant          Medication Assessment Form: Reviewed. Patient indicates being compliant with therapy Treatment compliance: Compliant Risk Assessment Profile: Aberrant behavior: See prior evaluations. None observed or detected today Comorbid factors increasing risk of overdose: See prior notes. No additional risks detected today Risk of substance use disorder (SUD): Low     Opioid Risk Tool - 03/14/17 1028      Family History of Substance Abuse   Alcohol Negative  Illegal Drugs Negative   Rx Drugs Negative     Personal History of Substance Abuse   Alcohol Negative   Illegal Drugs Negative   Rx Drugs Negative     Age   Age between 39-45 years  Yes     History of Preadolescent Sexual Abuse   History of Preadolescent Sexual Abuse Negative or Male     Psychological Disease   Psychological Disease Negative   Depression Negative     Total Score   Opioid Risk Tool Scoring 1   Opioid Risk Interpretation Low Risk     ORT Scoring interpretation table:  Score <3 = Low Risk for SUD  Score between 4-7 = Moderate Risk for SUD  Score >8 = High Risk for Opioid Abuse   Risk Mitigation Strategies:  Patient Counseling: Covered Patient-Prescriber Agreement (PPA): Present and active  Notification to other healthcare providers: Done  Pharmacologic Plan: No change in therapy, at this time  Laboratory Chemistry  Inflammation Markers (CRP: Acute Phase) (ESR: Chronic Phase) Lab Results  Component Value Date   CRP 8.5 (H) 12/27/2016   ESRSEDRATE 35 (H) 12/27/2016                 Renal Function Markers Lab Results  Component Value Date   BUN 6 12/27/2016   CREATININE  0.83 12/27/2016   GFRAA 133 12/27/2016   GFRNONAA 115 12/27/2016                 Hepatic Function Markers Lab Results  Component Value Date   AST 11 12/27/2016   ALT 15 12/27/2016   ALBUMIN 4.2 12/27/2016   ALKPHOS 56 12/27/2016                 Electrolytes Lab Results  Component Value Date   NA 144 12/27/2016   K 4.3 12/27/2016   CL 106 12/27/2016   CALCIUM 9.0 12/27/2016   MG 2.1 12/27/2016                 Neuropathy Markers Lab Results  Component Value Date   VITAMINB12 462 12/27/2016                 Bone Pathology Markers Lab Results  Component Value Date   ALKPHOS 56 12/27/2016   25OHVITD1 17 (L) 12/27/2016   25OHVITD2 <1.0 12/27/2016   25OHVITD3 17 12/27/2016   CALCIUM 9.0 12/27/2016                 Coagulation Parameters No results found for: INR, LABPROT, APTT, PLT               Cardiovascular Markers No results found for: BNP, HGB, HCT               Note: Lab results reviewed.  Recent Diagnostic Imaging Review  Dg C-arm 1-60 Min-no Report  Result Date: 03/08/2017 Fluoroscopy was utilized by the requesting physician.  No radiographic interpretation.   Note: Imaging results reviewed.          Meds   Current Meds  Medication Sig  . ergocalciferol (VITAMIN D2) 50000 units capsule Take 1 capsule (50,000 Units total) by mouth once a week. X12 weeks.  . gabapentin (NEURONTIN) 300 MG capsule Take 1-3 capsules (300-900 mg total) by mouth 3 (three) times daily. Follow written titration schedule.  . loratadine (CLARITIN) 10 MG tablet Take 10 mg by mouth daily as needed for allergies.  . montelukast (SINGULAIR) 10 MG tablet Take 10 mg  by mouth at bedtime.  . naloxone (NARCAN) 2 MG/2ML injection Inject content of syringe into thigh muscle. Call 911.  Derrill Memo ON 03/18/2017] Oxycodone HCl 10 MG TABS Take 1 tablet (10 mg total) by mouth every 6 (six) hours as needed.  . [DISCONTINUED] Oxycodone HCl 10 MG TABS Take 1 tablet (10 mg total) by mouth every 6 (six)  hours as needed.    ROS  Constitutional: Denies any fever or chills Gastrointestinal: No reported hemesis, hematochezia, vomiting, or acute GI distress Musculoskeletal: Denies any acute onset joint swelling, redness, loss of ROM, or weakness Neurological: No reported episodes of acute onset apraxia, aphasia, dysarthria, agnosia, amnesia, paralysis, loss of coordination, or loss of consciousness  Allergies  Mr. Laabs is allergic to bactrim [sulfamethoxazole-trimethoprim].  PFSH  Drug: Mr. Hurta  reports that he does not use drugs. Alcohol:  reports that he drinks alcohol. Tobacco:  reports that he has been smoking Cigarettes.  He has a 10.00 pack-year smoking history. He has never used smokeless tobacco. Medical:  has a past medical history of Allergy (2012); Degeneration of lumbar or lumbosacral intervertebral disc (L4-L5) (12/27/2016); Headache; and Opiate use (02/15/2017). Surgical: Mr. Ditmer  has a past surgical history that includes Fracture surgery (Left); Spine surgery (Right, 06/16/2015); and Back surgery. Family: family history includes Diabetes in his father.  Constitutional Exam  General appearance: Well nourished, well developed, and well hydrated. In no apparent acute distress Vitals:   03/14/17 1023  BP: 131/86  Pulse: 71  Resp: 16  Temp: 98.7 F (37.1 C)  SpO2: 100%  Weight: 277 lb (125.6 kg)  Height: 5' 11"  (1.803 m)  Psych/Mental status: Alert, oriented x 3 (person, place, & time)       Eyes: PERLA Respiratory: No evidence of acute respiratory distress   Lumbar Spine Area Exam  Skin & Axial Inspection: No masses, redness, or swelling Alignment: Symmetrical Functional ROM: Unrestricted ROM      Stability: No instability detected Muscle Tone/Strength: Functionally intact. No obvious neuro-muscular anomalies detected. Sensory (Neurological): Unimpaired Palpation: Complains of area being tender to palpation       Provocative Tests: Lumbar  Hyperextension and rotation test: Positive bilaterally for facet joint pain. Lumbar Lateral bending test: evaluation deferred today       Patrick's Maneuver: evaluation deferred today                    Gait & Posture Assessment  Ambulation: Unassisted Gait: Relatively normal for age and body habitus Posture: WNL   Lower Extremity Exam    Side: Right lower extremity  Side: Left lower extremity  Skin & Extremity Inspection: Skin color, temperature, and hair growth are WNL. No peripheral edema or cyanosis. No masses, redness, swelling, asymmetry, or associated skin lesions. No contractures.  Skin & Extremity Inspection: Skin color, temperature, and hair growth are WNL. No peripheral edema or cyanosis. No masses, redness, swelling, asymmetry, or associated skin lesions. No contractures.  Functional ROM: Unrestricted ROM          Functional ROM: Unrestricted ROM          Muscle Tone/Strength: Functionally intact. No obvious neuro-muscular anomalies detected.  Muscle Tone/Strength: Functionally intact. No obvious neuro-muscular anomalies detected.  Sensory (Neurological): Unimpaired  Sensory (Neurological): Unimpaired  Palpation: No palpable anomalies  Palpation: No palpable anomalies   Assessment  Primary Diagnosis & Pertinent Problem List: The primary encounter diagnosis was Chronic low back pain (Primary Area of Pain) (Bilateral) (R>L). Diagnoses of Lumbar facet  hypertrophy (Bilateral), DDD (degenerative disc disease), lumbar, Chronic pain syndrome, and Drug-induced constipation were also pertinent to this visit.  Status Diagnosis  Controlled Controlled Controlled 1. Chronic low back pain (Primary Area of Pain) (Bilateral) (R>L)   2. Lumbar facet hypertrophy (Bilateral)   3. DDD (degenerative disc disease), lumbar   4. Chronic pain syndrome   5. Drug-induced constipation     Problems updated and reviewed during this visit: Problem  Constipation   Plan of Care  Pharmacotherapy  (Medications Ordered): Meds ordered this encounter  Medications  . Oxycodone HCl 10 MG TABS    Sig: Take 1 tablet (10 mg total) by mouth every 6 (six) hours as needed.    Dispense:  120 tablet    Refill:  0    Do not place this medication, or any other prescription from our practice, on "Automatic Refill". Patient may have prescription filled one day early if pharmacy is closed on scheduled refill date. Do not fill until: 03/18/2017 To last until:04/17/2017    Order Specific Question:   Supervising Provider    Answer:   Milinda Pointer [phone]  . naloxegol oxalate (MOVANTIK) 25 MG TABS tablet    Sig: Take 1 tablet (25 mg total) by mouth daily. Take on an empty stomach at least 1 hour before or 2 hours after a meal.    Dispense:  30 tablet    Refill:  0    Please instruct the patient not to break the tablet.    Order Specific Question:   Supervising Provider    Answer:   Milinda Pointer [phone]   New Prescriptions   NALOXEGOL OXALATE (MOVANTIK) 25 MG TABS TABLET    Take 1 tablet (25 mg total) by mouth daily. Take on an empty stomach at least 1 hour before or 2 hours after a meal.   Medications administered today: Mr. Meroney had no medications administered during this visit. Lab-work, procedure(s), and/or referral(s): No orders of the defined types were placed in this encounter.  Imaging and/or referral(s): None  Interventional management options: Planned, scheduled, and/or pending:    Not at this time   Considering:   Diagnostic bilateral lumbar facet block  Possible bilateral lumbar facet RFA  Diagnostic bilateral sacroiliac joint block Possible bilateral sacroiliac joint RFA Diagnostic caudal epidural steroid injection + diagnostic epidurogram  Possible Racz procedure  Diagnostic left intra-articular knee injection with local anesthetic and steroid  Possible left series of 5 intra-articular Hyalgan knee injections  Diagnostic left Genicular nerve block   Possible left Genicular nerve RFA    PRN Procedures:   To be determined at a later time   Provider-requested follow-up: Return in about 4 weeks (around 04/11/2017) for MedMgmt.  Future Appointments Date Time Provider [place]  04/06/2017 10:15 AM Milinda Pointer, MD [facility] None   Primary Care Physician: Vidal Schwalbe, MD Location: [facility] Note by: Vevelyn Francois NP Date: 03/14/2017; Time: 11:08 AM  Pain Score Disclaimer: We use the NRS-11 scale. This is a self-reported, subjective measurement of pain severity with only modest accuracy. It is used primarily to identify changes within a particular patient. It must be understood that outpatient pain scales are significantly less accurate that those used for research, where they can be applied under ideal controlled circumstances with minimal exposure to variables. In reality, the score is likely to be a combination of pain intensity and pain affect, where pain affect describes the degree of emotional arousal or changes in action readiness  caused by the sensory experience of pain. Factors such as social and work situation, setting, emotional state, anxiety levels, expectation, and prior pain experience may influence pain perception and show large inter-individual differences that may also be affected by time variables.  Patient instructions provided during this appointment: Patient Instructions    ____________________________________________________________________________________________  Medication Rules  Applies to: All patients receiving prescriptions (written or electronic).  Pharmacy of record: Pharmacy where electronic prescriptions will be sent. If written prescriptions are taken to a different pharmacy, please inform the nursing staff. The pharmacy listed in the electronic medical record should be the one where you would like electronic prescriptions to be sent.  Prescription  refills: Only during scheduled appointments. Applies to both, written and electronic prescriptions.  NOTE: The following applies primarily to controlled substances (Opioid* Pain Medications).   Patient's responsibilities: 1. Pain Pills: Bring all pain pills to every appointment (except for procedure appointments). 2. Pill Bottles: Bring pills in original pharmacy bottle. Always bring newest bottle. Bring bottle, even if empty. 3. Medication refills: You are responsible for knowing and keeping track of what medications you need refilled. The day before your appointment, write a list of all prescriptions that need to be refilled. Bring that list to your appointment and give it to the admitting nurse. Prescriptions will be written only during appointments. If you forget a medication, it will not be "Called in", "Faxed", or "electronically sent". You will need to get another appointment to get these prescribed. 4. Prescription Accuracy: You are responsible for carefully inspecting your prescriptions before leaving our office. Have the discharge nurse carefully go over each prescription with you, before taking them home. Make sure that your name is accurately spelled, that your address is correct. Check the name and dose of your medication to make sure it is accurate. Check the number of pills, and the written instructions to make sure they are clear and accurate. Make sure that you are given enough medication to last until your next medication refill appointment. 5. Taking Medication: Take medication as prescribed. Never take more pills than instructed. Never take medication more frequently than prescribed. Taking less pills or less frequently is permitted and encouraged, when it comes to controlled substances (written prescriptions).  6. Inform other Doctors: Always inform, all of your healthcare providers, of all the medications you take. 7. Pain Medication from other Providers: You are not allowed to  accept any additional pain medication from any other Doctor or Healthcare provider. There are two exceptions to this rule. (see below) In the event that you require additional pain medication, you are responsible for notifying us, as stated below. 8. Medication Agreement: You are responsible for carefully reading and following our Medication Agreement. This must be signed before receiving any prescriptions from our practice. Safely store a copy of your signed Agreement. Violations to the Agreement will result in no further prescriptions. (Additional copies of our Medication Agreement are available upon request.) 9. Laws, Rules, & Regulations: All patients are expected to follow all Federal and [employer], Rules, [employer]. Ignorance of the Laws does not constitute a valid excuse. The use of any illegal substances is prohibited. 10. Adopted CDC guidelines & recommendations: Target dosing levels will be at or below 60 MME/day. Use of benzodiazepines** is not recommended.  Exceptions: There are only two exceptions to the rule of not receiving pain medications from other Healthcare Providers. 1. Exception #1 (Emergencies): In the event of an emergency (i.e.: accident requiring emergency care), you  are allowed to receive additional pain medication. However, you are responsible for: As soon as you are able, call our office [phone], at any time of the day or night, and leave a message stating your name, the date and nature of the emergency, and the name and dose of the medication prescribed. In the event that your call is answered by a member of our staff, make sure to document and save the date, time, and the name of the person that took your information.  2. Exception #2 (Planned Surgery): In the event that you are scheduled by another doctor or dentist to have any type of surgery or procedure, you are allowed (for a period no longer than 30 days), to receive additional pain medication, for  the acute post-op pain. However, in this case, you are responsible for picking up a copy of our "Post-op Pain Management for Surgeons" handout, and giving it to your surgeon or dentist. This document is available at our office, and does not require an appointment to obtain it. Simply go to our office during business hours (Monday-Thursday from 8:00 AM to 4:00 PM) (Friday 8:00 AM to 12:00 Noon) or if you have a scheduled appointment with [place], prior to your surgery, and ask for it by name. In addition, you will need to provide [place] with your name, name of your surgeon, type of surgery, and date of procedure or surgery.  *Opioid medications include: morphine, codeine, oxycodone, oxymorphone, hydrocodone, hydromorphone, meperidine, tramadol, tapentadol, buprenorphine, fentanyl, methadone. **Benzodiazepine medications include: diazepam (Valium), alprazolam (Xanax), clonazepam (Klonopine), lorazepam (Ativan), clorazepate (Tranxene), chlordiazepoxide (Librium), estazolam (Prosom), oxazepam (Serax), temazepam (Restoril), triazolam (Halcion)  ____________________________________________________________________________________________  BMI Assessment: Estimated body mass index is 38.63 kg/m as calculated from the following:   Height as of this encounter: 5' 11"  (1.803 m).   Weight as of this encounter: 277 lb (125.6 kg).  BMI interpretation table: BMI level Category Range association with higher incidence of chronic pain  <18 kg/m2 Underweight   18.5-24.9 kg/m2 Ideal body weight   25-29.9 kg/m2 Overweight Increased incidence by 20%  30-34.9 kg/m2 Obese (Class I) Increased incidence by 68%  35-39.9 kg/m2 Severe obesity (Class II) Increased incidence by 136%  >40 kg/m2 Extreme obesity (Class III) Increased incidence by 254%   BMI Readings from Last 4 Encounters:  03/14/17 38.63 kg/m  03/08/17 38.63 kg/m  02/16/17 38.08 kg/m  12/27/16 38.35 kg/m   Wt Readings from Last 4 Encounters:  03/14/17 277 lb  (125.6 kg)  03/08/17 277 lb (125.6 kg)  02/16/17 273 lb (123.8 kg)  12/27/16 275 lb (124.7 kg)

## 2017-03-14 ENCOUNTER — Encounter: Payer: Self-pay | Admitting: Nurse Practitioner

## 2017-03-14 ENCOUNTER — Ambulatory Visit: Payer: BLUE CROSS/BLUE SHIELD | Attending: Nurse Practitioner | Admitting: Nurse Practitioner

## 2017-03-14 VITALS — BP 131/86 | HR 71 | Temp 98.7°F | Resp 16 | Ht 71.0 in | Wt 277.0 lb

## 2017-03-14 DIAGNOSIS — K5903 Drug induced constipation: Secondary | ICD-10-CM | POA: Diagnosis not present

## 2017-03-14 DIAGNOSIS — M5441 Lumbago with sciatica, right side: Secondary | ICD-10-CM | POA: Diagnosis not present

## 2017-03-14 DIAGNOSIS — G8929 Other chronic pain: Secondary | ICD-10-CM | POA: Diagnosis not present

## 2017-03-14 DIAGNOSIS — E559 Vitamin D deficiency, unspecified: Secondary | ICD-10-CM | POA: Insufficient documentation

## 2017-03-14 DIAGNOSIS — X58XXXA Exposure to other specified factors, initial encounter: Secondary | ICD-10-CM | POA: Diagnosis not present

## 2017-03-14 DIAGNOSIS — M545 Low back pain: Secondary | ICD-10-CM | POA: Insufficient documentation

## 2017-03-14 DIAGNOSIS — R51 Headache: Secondary | ICD-10-CM | POA: Diagnosis not present

## 2017-03-14 DIAGNOSIS — T50905A Adverse effect of unspecified drugs, medicaments and biological substances, initial encounter: Secondary | ICD-10-CM | POA: Insufficient documentation

## 2017-03-14 DIAGNOSIS — Z881 Allergy status to other antibiotic agents status: Secondary | ICD-10-CM | POA: Insufficient documentation

## 2017-03-14 DIAGNOSIS — Z79891 Long term (current) use of opiate analgesic: Secondary | ICD-10-CM | POA: Insufficient documentation

## 2017-03-14 DIAGNOSIS — Z87828 Personal history of other (healed) physical injury and trauma: Secondary | ICD-10-CM | POA: Insufficient documentation

## 2017-03-14 DIAGNOSIS — M5136 Other intervertebral disc degeneration, lumbar region: Secondary | ICD-10-CM | POA: Diagnosis not present

## 2017-03-14 DIAGNOSIS — F17210 Nicotine dependence, cigarettes, uncomplicated: Secondary | ICD-10-CM | POA: Diagnosis not present

## 2017-03-14 DIAGNOSIS — M5442 Lumbago with sciatica, left side: Secondary | ICD-10-CM

## 2017-03-14 DIAGNOSIS — Z833 Family history of diabetes mellitus: Secondary | ICD-10-CM | POA: Diagnosis not present

## 2017-03-14 DIAGNOSIS — M47896 Other spondylosis, lumbar region: Secondary | ICD-10-CM | POA: Diagnosis not present

## 2017-03-14 DIAGNOSIS — G894 Chronic pain syndrome: Secondary | ICD-10-CM | POA: Diagnosis not present

## 2017-03-14 DIAGNOSIS — K5900 Constipation, unspecified: Secondary | ICD-10-CM | POA: Insufficient documentation

## 2017-03-14 DIAGNOSIS — M47816 Spondylosis without myelopathy or radiculopathy, lumbar region: Secondary | ICD-10-CM

## 2017-03-14 MED ORDER — OXYCODONE HCL 10 MG PO TABS: 10.0000 mg | ORAL_TABLET | Freq: Four times a day (QID) | ORAL | 0 refills | Status: DC | PRN

## 2017-03-14 MED ORDER — NALOXEGOL OXALATE 25 MG PO TABS: 25.0000 mg | ORAL_TABLET | Freq: Every day | ORAL | 0 refills | Status: DC

## 2017-03-14 NOTE — Progress Notes (Signed)
Nursing Pain Medication Assessment:  Safety precautions to be maintained throughout the outpatient stay will include: orient to surroundings, keep bed in low position, maintain call bell within reach at all times, provide assistance with transfer out of bed and ambulation.  Medication Inspection Compliance: Pill count conducted under aseptic conditions, in front of the patient. Neither the pills nor the bottle was removed from the patient's sight at any time. Once count was completed pills were immediately returned to the patient in their original bottle.  Medication: Oxycodone IR Pill/Patch Count: 18 of 120 pills remain Pill/Patch Appearance: Markings consistent with prescribed medication Bottle Appearance: Standard pharmacy container. Clearly labeled. Filled Date: 08 / 01 / 2018 Last Medication intake:  Today

## 2017-03-14 NOTE — Patient Instructions (Addendum)
____________________________________________________________________________________________  Medication Rules  Applies to: All patients receiving prescriptions (written or electronic).  Pharmacy of record: Pharmacy where electronic prescriptions will be sent. If written prescriptions are taken to a different pharmacy, please inform the nursing staff. The pharmacy listed in the electronic medical record should be the one where you would like electronic prescriptions to be sent.  Prescription refills: Only during scheduled appointments. Applies to both, written and electronic prescriptions.  NOTE: The following applies primarily to controlled substances (Opioid* Pain Medications).   Patient's responsibilities: 1. Pain Pills: Bring all pain pills to every appointment (except for procedure appointments). 2. Pill Bottles: Bring pills in original pharmacy bottle. Always bring newest bottle. Bring bottle, even if empty. 3. Medication refills: You are responsible for knowing and keeping track of what medications you need refilled. The day before your appointment, write a list of all prescriptions that need to be refilled. Bring that list to your appointment and give it to the admitting nurse. Prescriptions will be written only during appointments. If you forget a medication, it will not be "Called in", "Faxed", or "electronically sent". You will need to get another appointment to get these prescribed. 4. Prescription Accuracy: You are responsible for carefully inspecting your prescriptions before leaving our office. Have the discharge nurse carefully go over each prescription with you, before taking them home. Make sure that your name is accurately spelled, that your address is correct. Check the name and dose of your medication to make sure it is accurate. Check the number of pills, and the written instructions to make sure they are clear and accurate. Make sure that you are given enough medication to  last until your next medication refill appointment. 5. Taking Medication: Take medication as prescribed. Never take more pills than instructed. Never take medication more frequently than prescribed. Taking less pills or less frequently is permitted and encouraged, when it comes to controlled substances (written prescriptions).  6. Inform other Doctors: Always inform, all of your healthcare providers, of all the medications you take. 7. Pain Medication from other Providers: You are not allowed to accept any additional pain medication from any other Doctor or Healthcare provider. There are two exceptions to this rule. (see below) In the event that you require additional pain medication, you are responsible for notifying us, as stated below. 8. Medication Agreement: You are responsible for carefully reading and following our Medication Agreement. This must be signed before receiving any prescriptions from our practice. Safely store a copy of your signed Agreement. Violations to the Agreement will result in no further prescriptions. (Additional copies of our Medication Agreement are available upon request.) 9. Laws, Rules, & Regulations: All patients are expected to follow all Federal and State Laws, Statutes, Rules, & Regulations. Ignorance of the Laws does not constitute a valid excuse. The use of any illegal substances is prohibited. 10. Adopted CDC guidelines & recommendations: Target dosing levels will be at or below 60 MME/day. Use of benzodiazepines** is not recommended.  Exceptions: There are only two exceptions to the rule of not receiving pain medications from other Healthcare Providers. 1. Exception #1 (Emergencies): In the event of an emergency (i.e.: accident requiring emergency care), you are allowed to receive additional pain medication. However, you are responsible for: As soon as you are able, call our office [phone], at any time of the day or night, and leave a message stating your  name, the date and nature of the emergency, and the name and dose of the medication   prescribed. In the event that your call is answered by a member of our staff, make sure to document and save the date, time, and the name of the person that took your information.  2. Exception #2 (Planned Surgery): In the event that you are scheduled by another doctor or dentist to have any type of surgery or procedure, you are allowed (for a period no longer than 30 days), to receive additional pain medication, for the acute post-op pain. However, in this case, you are responsible for picking up a copy of our "Post-op Pain Management for Surgeons" handout, and giving it to your surgeon or dentist. This document is available at our office, and does not require an appointment to obtain it. Simply go to our office during business hours (Monday-Thursday from 8:00 AM to 4:00 PM) (Friday 8:00 AM to 12:00 Noon) or if you have a scheduled appointment with [place], prior to your surgery, and ask for it by name. In addition, you will need to provide [place] with your name, name of your surgeon, type of surgery, and date of procedure or surgery.  *Opioid medications include: morphine, codeine, oxycodone, oxymorphone, hydrocodone, hydromorphone, meperidine, tramadol, tapentadol, buprenorphine, fentanyl, methadone. **Benzodiazepine medications include: diazepam (Valium), alprazolam (Xanax), clonazepam (Klonopine), lorazepam (Ativan), clorazepate (Tranxene), chlordiazepoxide (Librium), estazolam (Prosom), oxazepam (Serax), temazepam (Restoril), triazolam (Halcion)  ____________________________________________________________________________________________  BMI Assessment: Estimated body mass index is 38.63 kg/m as calculated from the following:   Height as of this encounter: 5\' 11"  (1.803 m).   Weight as of this encounter: 277 lb (125.6 kg).  BMI interpretation table: BMI level Category Range association with higher incidence of chronic  pain  <18 kg/m2 Underweight   18.5-24.9 kg/m2 Ideal body weight   25-29.9 kg/m2 Overweight Increased incidence by 20%  30-34.9 kg/m2 Obese (Class I) Increased incidence by 68%  35-39.9 kg/m2 Severe obesity (Class II) Increased incidence by 136%  >40 kg/m2 Extreme obesity (Class III) Increased incidence by 254%   BMI Readings from Last 4 Encounters:  03/14/17 38.63 kg/m  03/08/17 38.63 kg/m  02/16/17 38.08 kg/m  12/27/16 38.35 kg/m   Wt Readings from Last 4 Encounters:  03/14/17 277 lb (125.6 kg)  03/08/17 277 lb (125.6 kg)  02/16/17 273 lb (123.8 kg)  12/27/16 275 lb (124.7 kg)

## 2017-03-22 ENCOUNTER — Other Ambulatory Visit: Payer: Self-pay | Admitting: Nurse Practitioner

## 2017-03-22 MED ORDER — LUBIPROSTONE 24 MCG PO CAPS: 24.0000 ug | ORAL_CAPSULE | Freq: Two times a day (BID) | ORAL | 0 refills | Status: DC

## 2017-04-06 ENCOUNTER — Encounter: Payer: Self-pay | Admitting: Pain Medicine

## 2017-04-06 ENCOUNTER — Ambulatory Visit: Payer: BLUE CROSS/BLUE SHIELD | Attending: Pain Medicine | Admitting: Pain Medicine

## 2017-04-06 VITALS — BP 109/65 | HR 67 | Temp 97.8°F | Resp 20 | Ht 71.0 in | Wt 277.0 lb

## 2017-04-06 DIAGNOSIS — M47896 Other spondylosis, lumbar region: Secondary | ICD-10-CM | POA: Diagnosis not present

## 2017-04-06 DIAGNOSIS — M48061 Spinal stenosis, lumbar region without neurogenic claudication: Secondary | ICD-10-CM | POA: Insufficient documentation

## 2017-04-06 DIAGNOSIS — G8929 Other chronic pain: Secondary | ICD-10-CM

## 2017-04-06 DIAGNOSIS — M79605 Pain in left leg: Secondary | ICD-10-CM | POA: Insufficient documentation

## 2017-04-06 DIAGNOSIS — F17210 Nicotine dependence, cigarettes, uncomplicated: Secondary | ICD-10-CM | POA: Insufficient documentation

## 2017-04-06 DIAGNOSIS — Z9889 Other specified postprocedural states: Secondary | ICD-10-CM | POA: Diagnosis not present

## 2017-04-06 DIAGNOSIS — M5442 Lumbago with sciatica, left side: Secondary | ICD-10-CM

## 2017-04-06 DIAGNOSIS — M533 Sacrococcygeal disorders, not elsewhere classified: Secondary | ICD-10-CM | POA: Insufficient documentation

## 2017-04-06 DIAGNOSIS — M5441 Lumbago with sciatica, right side: Secondary | ICD-10-CM

## 2017-04-06 DIAGNOSIS — M47816 Spondylosis without myelopathy or radiculopathy, lumbar region: Secondary | ICD-10-CM

## 2017-04-06 DIAGNOSIS — M545 Low back pain: Secondary | ICD-10-CM | POA: Insufficient documentation

## 2017-04-06 DIAGNOSIS — M5136 Other intervertebral disc degeneration, lumbar region: Secondary | ICD-10-CM | POA: Diagnosis not present

## 2017-04-06 DIAGNOSIS — M79604 Pain in right leg: Secondary | ICD-10-CM | POA: Diagnosis not present

## 2017-04-06 DIAGNOSIS — M4696 Unspecified inflammatory spondylopathy, lumbar region: Secondary | ICD-10-CM

## 2017-04-06 DIAGNOSIS — Z823 Family history of stroke: Secondary | ICD-10-CM | POA: Insufficient documentation

## 2017-04-06 DIAGNOSIS — G894 Chronic pain syndrome: Secondary | ICD-10-CM | POA: Insufficient documentation

## 2017-04-06 DIAGNOSIS — E559 Vitamin D deficiency, unspecified: Secondary | ICD-10-CM | POA: Insufficient documentation

## 2017-04-06 DIAGNOSIS — Z5181 Encounter for therapeutic drug level monitoring: Secondary | ICD-10-CM | POA: Diagnosis present

## 2017-04-06 DIAGNOSIS — K5900 Constipation, unspecified: Secondary | ICD-10-CM | POA: Diagnosis not present

## 2017-04-06 DIAGNOSIS — Z833 Family history of diabetes mellitus: Secondary | ICD-10-CM | POA: Insufficient documentation

## 2017-04-06 MED ORDER — OXYCODONE HCL 10 MG PO TABS: 10.0000 mg | ORAL_TABLET | Freq: Four times a day (QID) | ORAL | 0 refills | Status: DC | PRN

## 2017-04-06 NOTE — Patient Instructions (Addendum)
____________________________________________________________________________________________ Nathan Lambert have been given a script for oxycodone today.  Pain Scale  Introduction: The pain score used by this practice is the Verbal Numerical Rating Scale (VNRS-11). This is an 11-point scale. It is for adults and children 10 years or older. There are significant differences in how the pain score is reported, used, and applied. Forget everything you learned in the past and learn this scoring system.  General Information: The scale should reflect your current level of pain. Unless you are specifically asked for the level of your worst pain, or your average pain. If you are asked for one of these two, then it should be understood that it is over the past 24 hours.  Basic Activities of Daily Living (ADL): Personal hygiene, dressing, eating, transferring, and using restroom.  Instructions: Most patients tend to report their level of pain as a combination of two factors, their physical pain and their psychosocial pain. This last one is also known as "suffering" and it is reflection of how physical pain affects you socially and psychologically. From now on, report them separately. From this point on, when asked to report your pain level, report only your physical pain. Use the following table for reference.  Pain Clinic Pain Levels (0-5/10)  Pain Level Score  Description  No Pain 0   Mild pain 1 Nagging, annoying, but does not interfere with basic activities of daily living (ADL). Patients are able to eat, bathe, get dressed, toileting (being able to get on and off the toilet and perform personal hygiene functions), transfer (move in and out of bed or a chair without assistance), and maintain continence (able to control bladder and bowel functions). Blood pressure and heart rate are unaffected. A normal heart rate for a healthy adult ranges from 60 to 100 bpm (beats per minute).   Mild to moderate pain 2 Noticeable  and distracting. Impossible to hide from other people. More frequent flare-ups. Still possible to adapt and function close to normal. It can be very annoying and may have occasional stronger flare-ups. With discipline, patients may get used to it and adapt.   Moderate pain 3 Interferes significantly with activities of daily living (ADL). It becomes difficult to feed, bathe, get dressed, get on and off the toilet or to perform personal hygiene functions. Difficult to get in and out of bed or a chair without assistance. Very distracting. With effort, it can be ignored when deeply involved in activities.   Moderately severe pain 4 Impossible to ignore for more than a few minutes. With effort, patients may still be able to manage work or participate in some social activities. Very difficult to concentrate. Signs of autonomic nervous system discharge are evident: dilated pupils (mydriasis); mild sweating (diaphoresis); sleep interference. Heart rate becomes elevated (>115 bpm). Diastolic blood pressure (lower number) rises above 100 mmHg. Patients find relief in laying down and not moving.   Severe pain 5 Intense and extremely unpleasant. Associated with frowning face and frequent crying. Pain overwhelms the senses.  Ability to do any activity or maintain social relationships becomes significantly limited. Conversation becomes difficult. Pacing back and forth is common, as getting into a comfortable position is nearly impossible. Pain wakes you up from deep sleep. Physical signs will be obvious: pupillary dilation; increased sweating; goosebumps; brisk reflexes; cold, clammy hands and feet; nausea, vomiting or dry heaves; loss of appetite; significant sleep disturbance with inability to fall asleep or to remain asleep. When persistent, significant weight loss is observed due to the  complete loss of appetite and sleep deprivation.  Blood pressure and heart rate becomes significantly elevated. Caution: If elevated  blood pressure triggers a pounding headache associated with blurred vision, then the patient should immediately seek attention at an urgent or emergency care unit, as these may be signs of an impending stroke.    Emergency Department Pain Levels (6-10/10)  Emergency Room Pain 6 Severely limiting. Requires emergency care and should not be seen or managed at an outpatient pain management facility. Communication becomes difficult and requires great effort. Assistance to reach the emergency department may be required. Facial flushing and profuse sweating along with potentially dangerous increases in heart rate and blood pressure will be evident.   Distressing pain 7 Self-care is very difficult. Assistance is required to transport, or use restroom. Assistance to reach the emergency department will be required. Tasks requiring coordination, such as bathing and getting dressed become very difficult.   Disabling pain 8 Self-care is no longer possible. At this level, pain is disabling. The individual is unable to do even the most "basic" activities such as walking, eating, bathing, dressing, transferring to a bed, or toileting. Fine motor skills are lost. It is difficult to think clearly.   Incapacitating pain 9 Pain becomes incapacitating. Thought processing is no longer possible. Difficult to remember your own name. Control of movement and coordination are lost.   The worst pain imaginable 10 At this level, most patients pass out from pain. When this level is reached, collapse of the autonomic nervous system occurs, leading to a sudden drop in blood pressure and heart rate. This in turn results in a temporary and dramatic drop in blood flow to the brain, leading to a loss of consciousness. Fainting is one of the body's self defense mechanisms. Passing out puts the brain in a calmed state and causes it to shut down for a while, in order to begin the healing process.    Summary: 1. Refer to this scale when  providing [place] with your pain level. 2. Be accurate and careful when reporting your pain level. This will help with your care. 3. Over-reporting your pain level will lead to loss of credibility. 4. Even a level of 1/10 means that there is pain and will be treated at our facility. 5. High, inaccurate reporting will be documented as "Symptom Exaggeration", leading to loss of credibility and suspicions of possible secondary gains such as obtaining more narcotics, or wanting to appear disabled, for fraudulent reasons. 6. Only pain levels of 5 or below will be seen at our facility. 7. Pain levels of 6 and above will be sent to the Emergency Department and the appointment cancelled. ____________________________________________________________________________________________  ____________________________________________________________________________________________  Preparing for Procedure with Sedation Instructions: . Oral Intake: Do not eat or drink anything for at least 8 hours prior to your procedure. . Transportation: Public transportation is not allowed. Bring an adult driver. The driver must be physically present in our waiting room before any procedure can be started. [facility] Kitchen Physical Assistance: Bring an adult physically capable of assisting you, in the event you need help. This adult should keep you company at home for at least 6 hours after the procedure. . Blood Pressure Medicine: Take your blood pressure medicine with a sip of water the morning of the procedure. . Blood thinners:  . Diabetics on insulin: Notify the staff so that you can be scheduled 1st case in the morning. If your diabetes requires high dose insulin, take only  of your normal insulin  dose the morning of the procedure and notify the staff that you have done so. . Preventing infections: Shower with an antibacterial soap the morning of your procedure. . Build-up your immune system: Take 1000 mg of Vitamin C with every meal (3  times a day) the day prior to your procedure. Marland Kitchen Antibiotics: Inform the staff if you have a condition or reason that requires you to take antibiotics before dental procedures. . Pregnancy: If you are pregnant, call and cancel the procedure. . Sickness: If you have a cold, fever, or any active infections, call and cancel the procedure. . Arrival: You must be in the facility at least 30 minutes prior to your scheduled procedure. . Children: Do not bring children with you. . Dress appropriately: Bring dark clothing that you would not mind if they get stained. . Valuables: Do not bring any jewelry or valuables. Procedure appointments are reserved for interventional treatments only. Marland Kitchen No Prescription Refills. . No medication changes will be discussed during procedure appointments. . No disability issues will be discussed. ____________________________________________________________________________________________  Facet Blocks Patient Information  Description: The facets are joints in the spine between the vertebrae.  Like any joints in the body, facets can become irritated and painful.  Arthritis can also effect the facets.  By injecting steroids and local anesthetic in and around these joints, we can temporarily block the nerve supply to them.  Steroids act directly on irritated nerves and tissues to reduce selling and inflammation which often leads to decreased pain.  Facet blocks may be done anywhere along the spine from the neck to the low back depending upon the location of your pain.   After numbing the skin with local anesthetic (like Novocaine), a small needle is passed onto the facet joints under x-ray guidance.  You may experience a sensation of pressure while this is being done.  The entire block usually lasts about 15-25 minutes.   Conditions which may be treated by facet blocks:   Low back/buttock pain  Neck/shoulder pain  Certain types of headaches  Preparation for the  injection:  1. Do not eat any solid food or dairy products within 8 hours of your appointment. 2. You may drink clear liquid up to 3 hours before appointment.  Clear liquids include water, black coffee, juice or soda.  No milk or cream please. 3. You may take your regular medication, including pain medications, with a sip of water before your appointment.  Diabetics should hold regular insulin (if taken separately) and take 1/2 normal NPH dose the morning of the procedure.  Carry some sugar containing items with you to your appointment. 4. A driver must accompany you and be prepared to drive you home after your procedure. 5. Bring all your current medications with you. 6. An IV may be inserted and sedation may be given at the discretion of the physician. 7. A blood pressure cuff, EKG and other monitors will often be applied during the procedure.  Some patients may need to have extra oxygen administered for a short period. 8. You will be asked to provide medical information, including your allergies and medications, prior to the procedure.  We must know immediately if you are taking blood thinners (like Coumadin/Warfarin) or if you are allergic to IV iodine contrast (dye).  We must know if you could possible be pregnant.  Possible side-effects:   Bleeding from needle site  Infection (rare, may require surgery)  Nerve injury (rare)  Numbness & tingling (temporary)  Difficulty urinating (  rare, temporary)  Spinal headache (a headache worse with upright posture)  Light-headedness (temporary)  Pain at injection site (serveral days)  Decreased blood pressure (rare, temporary)  Weakness in arm/leg (temporary)  Pressure sensation in back/neck (temporary)   Call if you experience:   Fever/chills associated with headache or increased back/neck pain  Headache worsened by an upright position  New onset, weakness or numbness of an extremity below the injection site  Hives or  difficulty breathing (go to the emergency room)  Inflammation or drainage at the injection site(s)  Severe back/neck pain greater than usual  New symptoms which are concerning to you  Please note:  Although the local anesthetic injected can often make your back or neck feel good for several hours after the injection, the pain will likely return. It takes 3-7 days for steroids to work.  You may not notice any pain relief for at least one week.  If effective, we will often do a series of 2-3 injections spaced 3-6 weeks apart to maximally decrease your pain.  After the initial series, you may be a candidate for a more permanent nerve block of the facets.  If you have any questions, please call #[phone] [facility]

## 2017-04-06 NOTE — Progress Notes (Signed)
Patient's Name: Vitaly Wanat.  MRN: [id number]  Referring Provider: Vidal Schwalbe, MD  DOB: [birth year]  PCP: Vidal Schwalbe, MD  DOS: 04/06/2017  Note by: Gaspar Cola, MD  Service setting: Ambulatory outpatient  Specialty: Interventional Pain Management  Location: [facility] (AMB) Pain Management Facility    Patient type: Established   Primary Reason(s) for Visit: Encounter for prescription drug management & post-procedure evaluation of chronic illness with mild to moderate exacerbation(Level of risk: moderate) CC: Back Pain (low right)  HPI  Mr. Ellerman is a 36 y.o. year old, male patient, who comes today for a post-procedure evaluation and medication management. He has Intractable episodic cluster headache; Chronic pain syndrome; Degeneration of lumbar or lumbosacral intervertebral disc (L4-L5); Chronic low back pain (Primary Area of Pain) (Bilateral) (R>L); Sacroiliac joint pain (B) (R>L); Vitamin D deficiency; DDD (degenerative disc disease), lumbar; Chronic knee pain (Secondary Area of pain) (Left); Lumbar foraminal stenosis(L4-5 and L5-S1) (Bilateral); Failed back surgical syndrome; Chronic lower extremity pain (Bilateral) (L>R); Long term (current) use of opiate analgesic; Long term prescription opiate use; Opiate use (60 MME/Day); Lumbar facet hypertrophy (Bilateral); Lumbar facet syndrome (Bilateral) (R>L); Neurogenic pain; and Constipation on his problem list. His primarily concern today is the Back Pain (low right)  Pain Assessment: Location: Lower, Right Back Radiating: denies Onset: More than a month ago Duration: Chronic pain Quality: Aching, Constant Severity: 8 /10 (self-reported pain score)  Note: Reported level is compatible with observation.                   Timing: Constant Modifying factors: procedure at first  Mr. Milligan was last seen on 03/08/2017 for a procedure. During today's appointment we reviewed Mr. Ybarbo post-procedure results, as  well as his outpatient medication regimen.  Further details on both, my assessment(s), as well as the proposed treatment plan, please see below.  Controlled Substance Pharmacotherapy Assessment REMS (Risk Evaluation and Mitigation Strategy)  Analgesic: oxycodone 10 mg 4 times daily MME/day:60 mg/day. Dewayne Shorter, RN  04/06/2017 11:36 AM  Signed Safety precautions to be maintained throughout the outpatient stay will include: orient to surroundings, keep bed in low position, maintain call bell within reach at all times, provide assistance with transfer out of bed and ambulation.   Pharmacokinetics: Liberation and absorption (onset of action): WNL Distribution (time to peak effect): WNL Metabolism and excretion (duration of action): WNL         Pharmacodynamics: Desired effects: Analgesia: Mr. Hazard reports >50% benefit. Functional ability: Patient reports that medication allows him to accomplish basic ADLs Clinically meaningful improvement in function (CMIF): Sustained CMIF goals met Perceived effectiveness: Described as relatively effective, allowing for increase in activities of daily living (ADL) Undesirable effects: Side-effects or Adverse reactions: None reported Monitoring: [place] PMP: Online review of the past 7-monthperiod conducted. Compliant with practice rules and regulations List of all UDS test(s) done:  Lab Results  Component Value Date   SUMMARY FINAL 12/27/2016   Last UDS on record: Summary  Date Value Ref Range Status  12/27/2016 FINAL  Final    Comment:    ==================================================================== [facility] COMP DRUG ANALYSIS,UR ==================================================================== Test                             Result       Flag       Units Drug Present and Declared for Prescription Verification   Oxycodone  300          EXPECTED   ng/mg creat   Oxymorphone                    493           EXPECTED   ng/mg creat   Noroxycodone                   723          EXPECTED   ng/mg creat   Noroxymorphone                 129          EXPECTED   ng/mg creat    Sources of oxycodone are scheduled prescription medications.    Oxymorphone, noroxycodone, and noroxymorphone are expected    metabolites of oxycodone. Oxymorphone is also available as a    scheduled prescription medication.   Acetaminophen                  PRESENT      EXPECTED Drug Absent but Declared for Prescription Verification   Gabapentin                     Not Detected UNEXPECTED ==================================================================== Test                      Result    Flag   Units      Ref Range   Creatinine              257              mg/dL      >=20 ==================================================================== Declared Medications:  The flagging and interpretation on this report are based on the  following declared medications.  Unexpected results may arise from  inaccuracies in the declared medications.  **Note: The testing scope of this panel includes these medications:  Gabapentin  Oxycodone (Percocet)  **Note: The testing scope of this panel does not include small to  moderate amounts of these reported medications:  Acetaminophen (Percocet)  **Note: The testing scope of this panel does not include following  reported medications:  Loratadine (Claritin) ==================================================================== For clinical consultation, please call [phone]. ====================================================================    UDS interpretation: Compliant          Medication Assessment Form: Reviewed. Patient indicates being compliant with therapy Treatment compliance: Compliant Risk Assessment Profile: Aberrant behavior: See prior evaluations. None observed or detected today Comorbid factors increasing risk of overdose: See prior notes. No additional risks  detected today Risk of substance use disorder (SUD): Low     Opioid Risk Tool - 03/14/17 1028      Family History of Substance Abuse   Alcohol Negative   Illegal Drugs Negative   Rx Drugs Negative     Personal History of Substance Abuse   Alcohol Negative   Illegal Drugs Negative   Rx Drugs Negative     Age   Age between 38-45 years  Yes     History of Preadolescent Sexual Abuse   History of Preadolescent Sexual Abuse Negative or Male     Psychological Disease   Psychological Disease Negative   Depression Negative     Total Score   Opioid Risk Tool Scoring 1   Opioid Risk Interpretation Low Risk     ORT Scoring interpretation table:  Score <3 = Low Risk for SUD  Score between 4-7 =  Moderate Risk for SUD  Score >8 = High Risk for Opioid Abuse   Risk Mitigation Strategies:  Patient Counseling: Covered Patient-Prescriber Agreement (PPA): Present and active  Notification to other healthcare providers: Done  Pharmacologic Plan: No change in therapy, at this time  Post-Procedure Assessment  03/08/2017 Procedure: Diagnostic bilateral lumbar facet block under fluoroscopic guidance and IV sedation Pre-procedure pain score:  8/10 Post-procedure pain score: 0/10 (100% relief) Influential Factors: BMI: 38.63 kg/m Intra-procedural challenges: None observed.         Assessment challenges: None detected.              Reported side-effects: None.        Post-procedural adverse reactions or complications: None reported         Sedation: Sedation provided. When no sedatives are used, the analgesic levels obtained are directly associated to the effectiveness of the local anesthetics. However, when sedation is provided, the level of analgesia obtained during the initial 1 hour following the intervention, is believed to be the result of a combination of factors. These factors may include, but are not limited to: 1. The effectiveness of the local anesthetics used. 2. The effects of  the analgesic(s) and/or anxiolytic(s) used. 3. The degree of discomfort experienced by the patient at the time of the procedure. 4. The patients ability and reliability in recalling and recording the events. 5. The presence and influence of possible secondary gains and/or psychosocial factors. Reported result: Relief experienced during the 1st hour after the procedure: 100 % (Ultra-Short Term Relief)            Interpretative annotation: Clinically appropriate result. Analgesia during this period is likely to be Local Anesthetic and/or IV Sedative (Analgesic/Anxiolytic) related.          Effects of local anesthetic: The analgesic effects attained during this period are directly associated to the localized infiltration of local anesthetics and therefore cary significant diagnostic value as to the etiological location, or anatomical origin, of the pain. Expected duration of relief is directly dependent on the pharmacodynamics of the local anesthetic used. Long-acting (4-6 hours) anesthetics used.  Reported result: Relief during the next 4 to 6 hour after the procedure: 50 % (Short-Term Relief)            Interpretative annotation: Clinically appropriate result. Analgesia during this period is likely to be Local Anesthetic-related.          Long-term benefit: Defined as the period of time past the expected duration of local anesthetics (1 hour for short-acting and 4-6 hours for long-acting). With the possible exception of prolonged sympathetic blockade from the local anesthetics, benefits during this period are typically attributed to, or associated with, other factors such as analgesic sensory neuropraxia, antiinflammatory effects, or beneficial biochemical changes provided by agents other than the local anesthetics.  Reported result: Extended relief following procedure: 50 % (2 weeks) (Long-Term Relief)            Interpretative annotation: Clinically appropriate result. Good relief. No permanent benefit  expected. Inflammation plays a part in the etiology to the pain.          Current benefits: Defined as persistent relief that continues at this point in time.   Reported results: Treated area: <25 %       Interpretative annotation: Recurrence of symptoms. No permanent benefit expected. Effective diagnostic intervention.          Interpretation: Results would suggest a successful diagnostic intervention.  Plan:  Proceed with diagnostic procedure # 2.  Laboratory Chemistry  Inflammation Markers (CRP: Acute Phase) (ESR: Chronic Phase) Lab Results  Component Value Date   CRP 8.5 (H) 12/27/2016   ESRSEDRATE 35 (H) 12/27/2016                 Renal Function Markers Lab Results  Component Value Date   BUN 6 12/27/2016   CREATININE 0.83 12/27/2016   GFRAA 133 12/27/2016   GFRNONAA 115 12/27/2016                 Hepatic Function Markers Lab Results  Component Value Date   AST 11 12/27/2016   ALT 15 12/27/2016   ALBUMIN 4.2 12/27/2016   ALKPHOS 56 12/27/2016                 Electrolytes Lab Results  Component Value Date   NA 144 12/27/2016   K 4.3 12/27/2016   CL 106 12/27/2016   CALCIUM 9.0 12/27/2016   MG 2.1 12/27/2016                 Neuropathy Markers Lab Results  Component Value Date   VITAMINB12 462 12/27/2016                 Bone Pathology Markers Lab Results  Component Value Date   ALKPHOS 56 12/27/2016   25OHVITD1 17 (L) 12/27/2016   25OHVITD2 <1.0 12/27/2016   25OHVITD3 17 12/27/2016   CALCIUM 9.0 12/27/2016                 Coagulation Parameters No results found for: INR, LABPROT, APTT, PLT               Cardiovascular Markers No results found for: BNP, HGB, HCT               Note: Lab results reviewed.  Recent Diagnostic Imaging Review  Dg C-arm 1-60 Min-no Report  Result Date: 03/08/2017 Fluoroscopy was utilized by the requesting physician.  No radiographic interpretation.   Note: Imaging results reviewed.          Meds    Current Outpatient Prescriptions:  .  loratadine (CLARITIN) 10 MG tablet, Take 10 mg by mouth daily as needed for allergies., Disp: , Rfl:  .  montelukast (SINGULAIR) 10 MG tablet, Take 10 mg by mouth at bedtime., Disp: , Rfl:  .  naloxone (NARCAN) 2 MG/2ML injection, Inject content of syringe into thigh muscle. Call 911., Disp: 2 Syringe, Rfl: 1 .  [START ON 04/17/2017] Oxycodone HCl 10 MG TABS, Take 1 tablet (10 mg total) by mouth every 6 (six) hours as needed., Disp: 120 tablet, Rfl: 0 .  gabapentin (NEURONTIN) 300 MG capsule, Take 1-3 capsules (300-900 mg total) by mouth 3 (three) times daily. Follow written titration schedule., Disp: 270 capsule, Rfl: 0  ROS  Constitutional: Denies any fever or chills Gastrointestinal: No reported hemesis, hematochezia, vomiting, or acute GI distress Musculoskeletal: Denies any acute onset joint swelling, redness, loss of ROM, or weakness Neurological: No reported episodes of acute onset apraxia, aphasia, dysarthria, agnosia, amnesia, paralysis, loss of coordination, or loss of consciousness  Allergies  Mr. Hanken is allergic to bactrim [sulfamethoxazole-trimethoprim].  PFSH  Drug: Mr. Cwynar  reports that he does not use drugs. Alcohol:  reports that he does not drink alcohol. Tobacco:  reports that he has been smoking Cigarettes.  He has a 10.00 pack-year smoking history. He has never used smokeless tobacco. Medical:  has  a past medical history of Allergy (2012); Chronic low back pain (Primary Area of Pain) (Bilateral) (R>L); Degeneration of lumbar or lumbosacral intervertebral disc (L4-L5) (12/27/2016); Headache; and Opiate use (02/15/2017). Surgical: Mr. Brandis  has a past surgical history that includes Fracture surgery (Left); Spine surgery (Right, 06/16/2015); and Back surgery. Family: family history includes AAA (abdominal aortic aneurysm) in his mother; Diabetes in his father; Stroke in his mother.  Constitutional Exam  General  appearance: Well nourished, well developed, and well hydrated. In no apparent acute distress Vitals:   04/06/17 1124  BP: 109/65  Pulse: 67  Resp: 20  Temp: 97.8 F (36.6 C)  SpO2: 100%  Weight: 277 lb (125.6 kg)  Height: 5' 11"  (1.803 m)   BMI Assessment: Estimated body mass index is 38.63 kg/m as calculated from the following:   Height as of this encounter: 5' 11"  (1.803 m).   Weight as of this encounter: 277 lb (125.6 kg).  BMI interpretation table: BMI level Category Range association with higher incidence of chronic pain  <18 kg/m2 Underweight   18.5-24.9 kg/m2 Ideal body weight   25-29.9 kg/m2 Overweight Increased incidence by 20%  30-34.9 kg/m2 Obese (Class I) Increased incidence by 68%  35-39.9 kg/m2 Severe obesity (Class II) Increased incidence by 136%  >40 kg/m2 Extreme obesity (Class III) Increased incidence by 254%   BMI Readings from Last 4 Encounters:  04/06/17 38.63 kg/m  03/14/17 38.63 kg/m  03/08/17 38.63 kg/m  02/16/17 38.08 kg/m   Wt Readings from Last 4 Encounters:  04/06/17 277 lb (125.6 kg)  03/14/17 277 lb (125.6 kg)  03/08/17 277 lb (125.6 kg)  02/16/17 273 lb (123.8 kg)  Psych/Mental status: Alert, oriented x 3 (person, place, & time)       Eyes: PERLA Respiratory: No evidence of acute respiratory distress  Cervical Spine Area Exam  Skin & Axial Inspection: No masses, redness, edema, swelling, or associated skin lesions Alignment: Symmetrical Functional ROM: Unrestricted ROM      Stability: No instability detected Muscle Tone/Strength: Functionally intact. No obvious neuro-muscular anomalies detected. Sensory (Neurological): Unimpaired Palpation: No palpable anomalies              Upper Extremity (UE) Exam    Side: Right upper extremity  Side: Left upper extremity  Skin & Extremity Inspection: Skin color, temperature, and hair growth are WNL. No peripheral edema or cyanosis. No masses, redness, swelling, asymmetry, or associated skin  lesions. No contractures.  Skin & Extremity Inspection: Skin color, temperature, and hair growth are WNL. No peripheral edema or cyanosis. No masses, redness, swelling, asymmetry, or associated skin lesions. No contractures.  Functional ROM: Unrestricted ROM          Functional ROM: Unrestricted ROM          Muscle Tone/Strength: Functionally intact. No obvious neuro-muscular anomalies detected.  Muscle Tone/Strength: Functionally intact. No obvious neuro-muscular anomalies detected.  Sensory (Neurological): Unimpaired          Sensory (Neurological): Unimpaired          Palpation: No palpable anomalies              Palpation: No palpable anomalies              Specialized Test(s): Deferred         Specialized Test(s): Deferred          Thoracic Spine Area Exam  Skin & Axial Inspection: No masses, redness, or swelling Alignment: Symmetrical Functional ROM: Unrestricted ROM Stability: No  instability detected Muscle Tone/Strength: Functionally intact. No obvious neuro-muscular anomalies detected. Sensory (Neurological): Unimpaired Muscle strength & Tone: No palpable anomalies  Lumbar Spine Area Exam  Skin & Axial Inspection: No masses, redness, or swelling Alignment: Symmetrical Functional ROM: Improved after treatment      Stability: No instability detected Muscle Tone/Strength: Functionally intact. No obvious neuro-muscular anomalies detected. Sensory (Neurological): Movement-associated pain Palpation: Complains of area being tender to palpation       Provocative Tests: Lumbar Hyperextension and rotation test: Positive bilaterally for facet joint pain. Lumbar Lateral bending test: evaluation deferred today       Patrick's Maneuver: evaluation deferred today                    Gait & Posture Assessment  Ambulation: Unassisted Gait: Relatively normal for age and body habitus Posture: WNL   Lower Extremity Exam    Side: Right lower extremity  Side: Left lower extremity  Skin &  Extremity Inspection: Skin color, temperature, and hair growth are WNL. No peripheral edema or cyanosis. No masses, redness, swelling, asymmetry, or associated skin lesions. No contractures.  Skin & Extremity Inspection: Skin color, temperature, and hair growth are WNL. No peripheral edema or cyanosis. No masses, redness, swelling, asymmetry, or associated skin lesions. No contractures.  Functional ROM: Unrestricted ROM          Functional ROM: Unrestricted ROM          Muscle Tone/Strength: Functionally intact. No obvious neuro-muscular anomalies detected.  Muscle Tone/Strength: Functionally intact. No obvious neuro-muscular anomalies detected.  Sensory (Neurological): Unimpaired  Sensory (Neurological): Unimpaired  Palpation: No palpable anomalies  Palpation: No palpable anomalies   Assessment  Primary Diagnosis & Pertinent Problem List: The primary encounter diagnosis was Chronic low back pain (Primary Area of Pain) (Bilateral) (R>L). Diagnoses of Lumbar facet syndrome (Bilateral) (R>L), Lumbar facet hypertrophy (Bilateral), Sacroiliac joint pain, and Chronic pain syndrome were also pertinent to this visit.  Status Diagnosis  Controlled Improving Stable 1. Chronic low back pain (Primary Area of Pain) (Bilateral) (R>L)   2. Lumbar facet syndrome (Bilateral) (R>L)   3. Lumbar facet hypertrophy (Bilateral)   4. Sacroiliac joint pain   5. Chronic pain syndrome     Problems updated and reviewed during this visit: Problem  Constipation   Plan of Care  Pharmacotherapy (Medications Ordered): Meds ordered this encounter  Medications  . Oxycodone HCl 10 MG TABS    Sig: Take 1 tablet (10 mg total) by mouth every 6 (six) hours as needed.    Dispense:  120 tablet    Refill:  0    Do not place this medication, or any other prescription from our practice, on "Automatic Refill". Patient may have prescription filled one day early if pharmacy is closed on scheduled refill date. Do not fill until:  04/17/2017 To last until: 05/17/2017   New Prescriptions   No medications on file   Medications administered today: Mr. Broyles had no medications administered during this visit.   Procedure Orders     LUMBAR FACET(MEDIAL BRANCH NERVE BLOCK) MBNB     SACROILIAC JOINT INJECTINS  Lab Orders  No laboratory test(s) ordered today   Imaging Orders  No imaging studies ordered today   Referral Orders  No referral(s) requested today    Interventional management options: Planned, scheduled, and/or pending:   Diagnostic bilateral lumbar facet block #2 + bilateral sacroiliac joint block #1   Considering:   Diagnostic bilateral lumbar facet block  Possible  bilateral lumbar facet RFA  Diagnostic bilateral sacroiliac joint block Possible bilateral sacroiliac joint RFA Diagnostic caudal epidural steroid injection + diagnostic epidurogram  Possible Racz procedure  Diagnostic left intra-articular knee injection with local anesthetic and steroid  Possible left series of 5 intra-articular Hyalgan knee injections  Diagnostic left Genicular nerve block  Possible left Genicular nerve RFA    Palliative PRN treatment(s):   None at this time   Provider-requested follow-up: Return for Procedure (with sedation): (B) L-FCT BLK #2.  Future Appointments Date Time Provider Martin  05/09/2017 9:00 AM Vevelyn Francois, NP [facility] None   Primary Care Physician: Vidal Schwalbe, MD Location: [facility] Note by: Gaspar Cola, MD Date: 04/06/2017; Time: 12:30 PM

## 2017-04-06 NOTE — Progress Notes (Signed)
Safety precautions to be maintained throughout the outpatient stay will include: orient to surroundings, keep bed in low position, maintain call bell within reach at all times, provide assistance with transfer out of bed and ambulation.  

## 2017-04-11 ENCOUNTER — Ambulatory Visit: Payer: BLUE CROSS/BLUE SHIELD | Admitting: Nurse Practitioner

## 2017-05-05 ENCOUNTER — Encounter: Payer: Self-pay | Admitting: Nurse Practitioner

## 2017-05-05 ENCOUNTER — Ambulatory Visit: Payer: BLUE CROSS/BLUE SHIELD | Attending: Nurse Practitioner | Admitting: Nurse Practitioner

## 2017-05-05 DIAGNOSIS — M47896 Other spondylosis, lumbar region: Secondary | ICD-10-CM | POA: Diagnosis not present

## 2017-05-05 DIAGNOSIS — F17210 Nicotine dependence, cigarettes, uncomplicated: Secondary | ICD-10-CM | POA: Diagnosis not present

## 2017-05-05 DIAGNOSIS — M5136 Other intervertebral disc degeneration, lumbar region: Secondary | ICD-10-CM | POA: Diagnosis not present

## 2017-05-05 DIAGNOSIS — M5441 Lumbago with sciatica, right side: Secondary | ICD-10-CM | POA: Diagnosis not present

## 2017-05-05 DIAGNOSIS — G8929 Other chronic pain: Secondary | ICD-10-CM | POA: Diagnosis not present

## 2017-05-05 DIAGNOSIS — G44011 Episodic cluster headache, intractable: Secondary | ICD-10-CM | POA: Insufficient documentation

## 2017-05-05 DIAGNOSIS — G894 Chronic pain syndrome: Secondary | ICD-10-CM

## 2017-05-05 DIAGNOSIS — M47816 Spondylosis without myelopathy or radiculopathy, lumbar region: Secondary | ICD-10-CM

## 2017-05-05 DIAGNOSIS — M8938 Hypertrophy of bone, other site: Secondary | ICD-10-CM | POA: Insufficient documentation

## 2017-05-05 DIAGNOSIS — M5442 Lumbago with sciatica, left side: Secondary | ICD-10-CM

## 2017-05-05 DIAGNOSIS — Z5181 Encounter for therapeutic drug level monitoring: Secondary | ICD-10-CM | POA: Diagnosis not present

## 2017-05-05 DIAGNOSIS — M79604 Pain in right leg: Secondary | ICD-10-CM | POA: Diagnosis not present

## 2017-05-05 DIAGNOSIS — Z79891 Long term (current) use of opiate analgesic: Secondary | ICD-10-CM | POA: Diagnosis not present

## 2017-05-05 DIAGNOSIS — M79605 Pain in left leg: Secondary | ICD-10-CM | POA: Insufficient documentation

## 2017-05-05 DIAGNOSIS — Z79899 Other long term (current) drug therapy: Secondary | ICD-10-CM | POA: Diagnosis not present

## 2017-05-05 DIAGNOSIS — M48061 Spinal stenosis, lumbar region without neurogenic claudication: Secondary | ICD-10-CM | POA: Diagnosis not present

## 2017-05-05 DIAGNOSIS — E559 Vitamin D deficiency, unspecified: Secondary | ICD-10-CM | POA: Insufficient documentation

## 2017-05-05 DIAGNOSIS — M545 Low back pain: Secondary | ICD-10-CM | POA: Diagnosis not present

## 2017-05-05 DIAGNOSIS — K5900 Constipation, unspecified: Secondary | ICD-10-CM | POA: Insufficient documentation

## 2017-05-05 DIAGNOSIS — M533 Sacrococcygeal disorders, not elsewhere classified: Secondary | ICD-10-CM

## 2017-05-05 DIAGNOSIS — M51369 Other intervertebral disc degeneration, lumbar region without mention of lumbar back pain or lower extremity pain: Secondary | ICD-10-CM

## 2017-05-05 MED ORDER — OXYCODONE HCL 10 MG PO TABS: 10.0000 mg | ORAL_TABLET | Freq: Four times a day (QID) | ORAL | 0 refills | Status: DC | PRN

## 2017-05-05 NOTE — Progress Notes (Signed)
Nursing Pain Medication Assessment:  Safety precautions to be maintained throughout the outpatient stay will include: orient to surroundings, keep bed in low position, maintain call bell within reach at all times, provide assistance with transfer out of bed and ambulation.  Medication Inspection Compliance: Pill count conducted under aseptic conditions, in front of the patient. Neither the pills nor the bottle was removed from the patient's sight at any time. Once count was completed pills were immediately returned to the patient in their original bottle.  Medication: Oxycodone IR Pill/Patch Count: 56 of 120 pills remain Pill/Patch Appearance: Markings consistent with prescribed medication Bottle Appearance: Standard pharmacy container. Clearly labeled. Filled Date: 10 / 01 / 2018 Last Medication intake:  Today

## 2017-05-05 NOTE — Patient Instructions (Addendum)
____________________________________________________________________________________________  Medication Rules  Applies to: All patients receiving prescriptions (written or electronic).  Pharmacy of record: Pharmacy where electronic prescriptions will be sent. If written prescriptions are taken to a different pharmacy, please inform the nursing staff. The pharmacy listed in the electronic medical record should be the one where you would like electronic prescriptions to be sent.  Prescription refills: Only during scheduled appointments. Applies to both, written and electronic prescriptions.  NOTE: The following applies primarily to controlled substances (Opioid* Pain Medications).   Patient's responsibilities: 1. Pain Pills: Bring all pain pills to every appointment (except for procedure appointments). 2. Pill Bottles: Bring pills in original pharmacy bottle. Always bring newest bottle. Bring bottle, even if empty. 3. Medication refills: You are responsible for knowing and keeping track of what medications you need refilled. The day before your appointment, write a list of all prescriptions that need to be refilled. Bring that list to your appointment and give it to the admitting nurse. Prescriptions will be written only during appointments. If you forget a medication, it will not be "Called in", "Faxed", or "electronically sent". You will need to get another appointment to get these prescribed. 4. Prescription Accuracy: You are responsible for carefully inspecting your prescriptions before leaving our office. Have the discharge nurse carefully go over each prescription with you, before taking them home. Make sure that your name is accurately spelled, that your address is correct. Check the name and dose of your medication to make sure it is accurate. Check the number of pills, and the written instructions to make sure they are clear and accurate. Make sure that you are given enough medication to  last until your next medication refill appointment. 5. Taking Medication: Take medication as prescribed. Never take more pills than instructed. Never take medication more frequently than prescribed. Taking less pills or less frequently is permitted and encouraged, when it comes to controlled substances (written prescriptions).  6. Inform other Doctors: Always inform, all of your healthcare providers, of all the medications you take. 7. Pain Medication from other Providers: You are not allowed to accept any additional pain medication from any other Doctor or Healthcare provider. There are two exceptions to this rule. (see below) In the event that you require additional pain medication, you are responsible for notifying us, as stated below. 8. Medication Agreement: You are responsible for carefully reading and following our Medication Agreement. This must be signed before receiving any prescriptions from our practice. Safely store a copy of your signed Agreement. Violations to the Agreement will result in no further prescriptions. (Additional copies of our Medication Agreement are available upon request.) 9. Laws, Rules, & Regulations: All patients are expected to follow all Federal and State Laws, Statutes, Rules, & Regulations. Ignorance of the Laws does not constitute a valid excuse. The use of any illegal substances is prohibited. 10. Adopted CDC guidelines & recommendations: Target dosing levels will be at or below 60 MME/day. Use of benzodiazepines** is not recommended.  Exceptions: There are only two exceptions to the rule of not receiving pain medications from other Healthcare Providers. 1. Exception #1 (Emergencies): In the event of an emergency (i.e.: accident requiring emergency care), you are allowed to receive additional pain medication. However, you are responsible for: As soon as you are able, call our office [phone], at any time of the day or night, and leave a message stating your  name, the date and nature of the emergency, and the name and dose of the medication   prescribed. In the event that your call is answered by a member of our staff, make sure to document and save the date, time, and the name of the person that took your information.  2. Exception #2 (Planned Surgery): In the event that you are scheduled by another doctor or dentist to have any type of surgery or procedure, you are allowed (for a period no longer than 30 days), to receive additional pain medication, for the acute post-op pain. However, in this case, you are responsible for picking up a copy of our "Post-op Pain Management for Surgeons" handout, and giving it to your surgeon or dentist. This document is available at our office, and does not require an appointment to obtain it. Simply go to our office during business hours (Monday-Thursday from 8:00 AM to 4:00 PM) (Friday 8:00 AM to 12:00 Noon) or if you have a scheduled appointment with us, prior to your surgery, and ask for it by name. In addition, you will need to provide us with your name, name of your surgeon, type of surgery, and date of procedure or surgery.  *Opioid medications include: morphine, codeine, oxycodone, oxymorphone, hydrocodone, hydromorphone, meperidine, tramadol, tapentadol, buprenorphine, fentanyl, methadone. **Benzodiazepine medications include: diazepam (Valium), alprazolam (Xanax), clonazepam (Klonopine), lorazepam (Ativan), clorazepate (Tranxene), chlordiazepoxide (Librium), estazolam (Prosom), oxazepam (Serax), temazepam (Restoril), triazolam (Halcion)  ____________________________________________________________________________________________  BMI Assessment: Estimated body mass index is 37.66 kg/m as calculated from the following:   Height as of this encounter: 5\' 11"  (1.803 m).   Weight as of this encounter: 270 lb (122.5 kg).  BMI interpretation table: BMI level Category Range association with higher incidence of chronic  pain  <18 kg/m2 Underweight   18.5-24.9 kg/m2 Ideal body weight   25-29.9 kg/m2 Overweight Increased incidence by 20%  30-34.9 kg/m2 Obese (Class I) Increased incidence by 68%  35-39.9 kg/m2 Severe obesity (Class II) Increased incidence by 136%  >40 kg/m2 Extreme obesity (Class III) Increased incidence by 254%   BMI Readings from Last 4 Encounters:  05/05/17 37.66 kg/m  04/06/17 38.63 kg/m  03/14/17 38.63 kg/m  03/08/17 38.63 kg/m   Wt Readings from Last 4 Encounters:  05/05/17 270 lb (122.5 kg)  04/06/17 277 lb (125.6 kg)  03/14/17 277 lb (125.6 kg)  03/08/17 277 lb (125.6 kg)  Pain Management Discharge Instructions  General Discharge Instructions :  If you need to reach your doctor call: Monday-Friday 8:00 am - 4:00 pm at [phone] or toll free [phone].  After clinic hours [phone] to have operator reach doctor.  Bring all of your medication bottles to all your appointments in the pain clinic.  To cancel or reschedule your appointment with Pain Management please remember to call 24 hours in advance to avoid a fee.  Refer to the educational materials which you have been given on: General Risks, I had my Procedure. Discharge Instructions, Post Sedation.  Post Procedure Instructions:  The drugs you were given will stay in your system until tomorrow, so for the next 24 hours you should not drive, make any legal decisions or drink any alcoholic beverages.  You may eat anything you prefer, but it is better to start with liquids then soups and crackers, and gradually work up to solid foods.  Please notify your doctor immediately if you have any unusual bleeding, trouble breathing or pain that is not related to your normal pain.  Depending on the type of procedure that was done, some parts of your body may feel week and/or numb.  This usually  clears up by tonight or the next day.  Walk with the use of an assistive device or accompanied by an adult for the 24  hours.  You may use ice on the affected area for the first 24 hours.  Put ice in a Ziploc bag and cover with a towel and place against area 15 minutes on 15 minutes off.  You may switch to heat after 24 hours.GENERAL RISKS AND COMPLICATIONS  What are the risk, side effects and possible complications? Generally speaking, most procedures are safe.  However, with any procedure there are risks, side effects, and the possibility of complications.  The risks and complications are dependent upon the sites that are lesioned, or the type of nerve block to be performed.  The closer the procedure is to the spine, the more serious the risks are.  Great care is taken when placing the radio frequency needles, block needles or lesioning probes, but sometimes complications can occur. 1. Infection: Any time there is an injection through the skin, there is a risk of infection.  This is why sterile conditions are used for these blocks.  There are four possible types of infection. 1. Localized skin infection. 2. Central Nervous System Infection-This can be in the form of Meningitis, which can be deadly. 3. Epidural Infections-This can be in the form of an epidural abscess, which can cause pressure inside of the spine, causing compression of the spinal cord with subsequent paralysis. This would require an emergency surgery to decompress, and there are no guarantees that the patient would recover from the paralysis. 4. Discitis-This is an infection of the intervertebral discs.  It occurs in about 1% of discography procedures.  It is difficult to treat and it may lead to surgery.        2. Pain: the needles have to go through skin and soft tissues, will cause soreness.       3. Damage to internal structures:  The nerves to be lesioned may be near blood vessels or    other nerves which can be potentially damaged.       4. Bleeding: Bleeding is more common if the patient is taking blood thinners such as  aspirin, Coumadin,  Ticiid, Plavix, etc., or if he/she have some genetic predisposition  such as hemophilia. Bleeding into the spinal canal can cause compression of the spinal  cord with subsequent paralysis.  This would require an emergency surgery to  decompress and there are no guarantees that the patient would recover from the  paralysis.       5. Pneumothorax:  Puncturing of a lung is a possibility, every time a needle is introduced in  the area of the chest or upper back.  Pneumothorax refers to free air around the  collapsed lung(s), inside of the thoracic cavity (chest cavity).  Another two possible  complications related to a similar event would include: Hemothorax and Chylothorax.   These are variations of the Pneumothorax, where instead of air around the collapsed  lung(s), you may have blood or chyle, respectively.       6. Spinal headaches: They may occur with any procedures in the area of the spine.       7. Persistent CSF (Cerebro-Spinal Fluid) leakage: This is a rare problem, but may occur  with prolonged intrathecal or epidural catheters either due to the formation of a fistulous  track or a dural tear.       8. Nerve damage: By working so close to  the spinal cord, there is always a possibility of  nerve damage, which could be as serious as a permanent spinal cord injury with  paralysis.       9. Death:  Although rare, severe deadly allergic reactions known as "Anaphylactic  reaction" can occur to any of the medications used.      10. Worsening of the symptoms:  We can always make thing worse.  What are the chances of something like this happening? Chances of any of this occuring are extremely low.  By statistics, you have more of a chance of getting killed in a motor vehicle accident: while driving to the hospital than any of the above occurring .  Nevertheless, you should be aware that they are possibilities.  In general, it is similar to taking a shower.  Everybody knows that you can slip, hit your head and  get killed.  Does that mean that you should not shower again?  Nevertheless always keep in mind that statistics do not mean anything if you happen to be on the wrong side of them.  Even if a procedure has a 1 (one) in a 1,000,000 (million) chance of going wrong, it you happen to be that one..Also, keep in mind that by statistics, you have more of a chance of having something go wrong when taking medications.  Who should not have this procedure? If you are on a blood thinning medication (e.g. Coumadin, Plavix, see list of "Blood Thinners"), or if you have an active infection going on, you should not have the procedure.  If you are taking any blood thinners, please inform your physician.  How should I prepare for this procedure?  Do not eat or drink anything at least six hours prior to the procedure.  Bring a driver with you .  It cannot be a taxi.  Come accompanied by an adult that can drive you back, and that is strong enough to help you if your legs get weak or numb from the local anesthetic.  Take all of your medicines the morning of the procedure with just enough water to swallow them.  If you have diabetes, make sure that you are scheduled to have your procedure done first thing in the morning, whenever possible.  If you have diabetes, take only half of your insulin dose and notify our nurse that you have done so as soon as you arrive at the clinic.  If you are diabetic, but only take blood sugar pills (oral hypoglycemic), then do not take them on the morning of your procedure.  You may take them after you have had the procedure.  Do not take aspirin or any aspirin-containing medications, at least eleven (11) days prior to the procedure.  They may prolong bleeding.  Wear loose fitting clothing that may be easy to take off and that you would not mind if it got stained with Betadine or blood.  Do not wear any jewelry or perfume  Remove any nail coloring.  It will interfere with some of  our monitoring equipment.  NOTE: Remember that this is not meant to be interpreted as a complete list of all possible complications.  Unforeseen problems may occur.  BLOOD THINNERS The following drugs contain aspirin or other products, which can cause increased bleeding during surgery and should not be taken for 2 weeks prior to and 1 week after surgery.  If you should need take something for relief of minor pain, you may take acetaminophen which is found  in Tylenol,m Datril, Anacin-3 and Panadol. It is not blood thinner. The products listed below are.  Do not take any of the products listed below in addition to any listed on your instruction sheet.  A.P.C or A.P.C with Codeine Codeine Phosphate Capsules #3 Ibuprofen Ridaura  ABC compound Congesprin Imuran rimadil  Advil Cope Indocin Robaxisal  Alka-Seltzer Effervescent Pain Reliever and Antacid Coricidin or Coricidin-D  Indomethacin Rufen  Alka-Seltzer plus Cold Medicine Cosprin Ketoprofen S-A-C Tablets  Anacin Analgesic Tablets or Capsules Coumadin Korlgesic Salflex  Anacin Extra Strength Analgesic tablets or capsules CP-2 Tablets Lanoril Salicylate  Anaprox Cuprimine Capsules Levenox Salocol  Anexsia-D Dalteparin Magan Salsalate  Anodynos Darvon compound Magnesium Salicylate Sine-off  Ansaid Dasin Capsules Magsal Sodium Salicylate  Anturane Depen Capsules Marnal Soma  APF Arthritis pain formula Dewitt's Pills Measurin Stanback  Argesic Dia-Gesic Meclofenamic Sulfinpyrazone  Arthritis Bayer Timed Release Aspirin Diclofenac Meclomen Sulindac  Arthritis pain formula Anacin Dicumarol Medipren Supac  Analgesic (Safety coated) Arthralgen Diffunasal Mefanamic Suprofen  Arthritis Strength Bufferin Dihydrocodeine Mepro Compound Suprol  Arthropan liquid Dopirydamole Methcarbomol with Aspirin Synalgos  ASA tablets/Enseals Disalcid Micrainin Tagament  Ascriptin Doan's Midol Talwin  Ascriptin A/D Dolene Mobidin Tanderil  Ascriptin Extra Strength  Dolobid Moblgesic Ticlid  Ascriptin with Codeine Doloprin or Doloprin with Codeine Momentum Tolectin  Asperbuf Duoprin Mono-gesic Trendar  Aspergum Duradyne Motrin or Motrin IB Triminicin  Aspirin plain, buffered or enteric coated Durasal Myochrisine Trigesic  Aspirin Suppositories Easprin Nalfon Trillsate  Aspirin with Codeine Ecotrin Regular or Extra Strength Naprosyn Uracel  Atromid-S Efficin Naproxen Ursinus  Auranofin Capsules Elmiron Neocylate Vanquish  Axotal Emagrin Norgesic Verin  Azathioprine Empirin or Empirin with Codeine Normiflo Vitamin E  Azolid Emprazil Nuprin Voltaren  Bayer Aspirin plain, buffered or children's or timed BC Tablets or powders Encaprin Orgaran Warfarin Sodium  Buff-a-Comp Enoxaparin Orudis Zorpin  Buff-a-Comp with Codeine Equegesic Os-Cal-Gesic   Buffaprin Excedrin plain, buffered or Extra Strength Oxalid   Bufferin Arthritis Strength Feldene Oxphenbutazone   Bufferin plain or Extra Strength Feldene Capsules Oxycodone with Aspirin   Bufferin with Codeine Fenoprofen Fenoprofen Pabalate or Pabalate-SF   Buffets II Flogesic Panagesic   Buffinol plain or Extra Strength Florinal or Florinal with Codeine Panwarfarin   Buf-Tabs Flurbiprofen Penicillamine   Butalbital Compound Four-way cold tablets Penicillin   Butazolidin Fragmin Pepto-Bismol   Carbenicillin Geminisyn Percodan   Carna Arthritis Reliever Geopen Persantine   Carprofen Gold's salt Persistin   Chloramphenicol Goody's Phenylbutazone   Chloromycetin Haltrain Piroxlcam   Clmetidine heparin Plaquenil   Cllnoril Hyco-pap Ponstel   Clofibrate Hydroxy chloroquine Propoxyphen         Before stopping any of these medications, be sure to consult the physician who ordered them.  Some, such as Coumadin (Warfarin) are ordered to prevent or treat serious conditions such as "deep thrombosis", "pumonary embolisms", and other heart problems.  The amount of time that you may need off of the medication may also  vary with the medication and the reason for which you were taking it.  If you are taking any of these medications, please make sure you notify your pain physician before you undergo any procedures.          Facet Joint Block The facet joints connect the bones of the spine (vertebrae). They make it possible for you to bend, twist, and make other movements with your spine. They also keep you from bending too far, twisting too far, and making other excessive movements. A facet joint block is a  procedure where a numbing medicine (anesthetic) is injected into a facet joint. Often, a type of anti-inflammatory medicine called a steroid is also injected. A facet joint block may be done to diagnose neck or back pain. If the pain gets better after a facet joint block, it means the pain is probably coming from the facet joint. If the pain does not get better, it means the pain is probably not coming from the facet joint. A facet joint block may also be done to relieve neck or back pain caused by an inflamed facet joint. A facet joint block is only done to relieve pain if the pain does not improve with other methods, such as medicine, exercise programs, and physical therapy. Tell a health care provider about:  Any allergies you have.  All medicines you are taking, including vitamins, herbs, eye drops, creams, and over-the-counter medicines.  Any problems you or family members have had with anesthetic medicines.  Any blood disorders you have.  Any surgeries you have had.  Any medical conditions you have.  Whether you are pregnant or may be pregnant. What are the risks? Generally, this is a safe procedure. However, problems may occur, including:  Bleeding.  Injury to a nerve near the injection site.  Pain at the injection site.  Weakness or numbness in areas controlled by nerves near the injection site.  Infection.  Temporary fluid retention.  Allergic reactions to medicines or  dyes.  Injury to other structures or organs near the injection site.  What happens before the procedure?  Follow instructions from your health care provider about eating or drinking restrictions.  Ask your health care provider about: ? Changing or stopping your regular medicines. This is especially important if you are taking diabetes medicines or blood thinners. ? Taking medicines such as aspirin and ibuprofen. These medicines can thin your blood. Do not take these medicines before your procedure if your health care provider instructs you not to.  Do not take any new dietary supplements or medicines without asking your health care provider first.  Plan to have someone take you home after the procedure. What happens during the procedure?  You may need to remove your clothing and dress in an open-back gown.  The procedure will be done while you are lying on an X-ray table. You will most likely be asked to lie on your stomach, but you may be asked to lie in a different position if an injection will be made in your neck.  Machines will be used to monitor your oxygen levels, heart rate, and blood pressure.  If an injection will be made in your neck, an IV tube will be inserted into one of your veins. Fluids and medicine will flow directly into your body through the IV tube.  The area over the facet joint where the injection will be made will be cleaned with soap. The surrounding skin will be covered with clean drapes.  A numbing medicine (local anesthetic) will be applied to your skin. Your skin may sting or burn for a moment.  A video X-ray machine (fluoroscopy) will be used to locate the joint. In some cases, a CT scan may be used.  A contrast dye may be injected into the facet joint area to help locate the joint.  When the joint is located, an anesthetic will be injected into the joint through the needle.  Your health care provider will ask you whether you feel pain relief. If you  do feel relief,  a steroid may be injected to provide pain relief for a longer period of time. If you do not feel relief or feel only partial relief, additional injections of an anesthetic may be made in other facet joints.  The needle will be removed.  Your skin will be cleaned.  A bandage (dressing) will be applied over each injection site. The procedure may vary among health care providers and hospitals. What happens after the procedure?  You will be observed for 15-30 minutes before being allowed to go home. This information is not intended to replace advice given to you by your health care provider. Make sure you discuss any questions you have with your health care provider. Document Released: 11/24/2006 Document Revised: 08/06/2015 Document Reviewed: 03/31/2015 [employer]vier Interactive Patient Education  [employer].

## 2017-05-05 NOTE — Progress Notes (Signed)
Patient's Name: Nathan Lambert.  MRN: [id number]  Referring Provider: Vidal Schwalbe, MD  DOB: 24-Mar-1981  PCP: Vidal Schwalbe, MD  DOS: 05/05/2017  Note by: Vevelyn Francois NP  Service setting: Ambulatory outpatient  Specialty: Interventional Pain Management  Location: [facility] (AMB) Pain Management Facility    Patient type: Established    Primary Reason(s) for Visit: Encounter for prescription drug management. (Level of risk: moderate)  CC: Back Pain (low and right)  HPI  Nathan Lambert is a 36 y.o. year old, male patient, who comes today for a medication management evaluation. He has Intractable episodic cluster headache; Chronic pain syndrome; Degeneration of lumbar or lumbosacral intervertebral disc (L4-L5); Chronic low back pain (Primary Area of Pain) (Bilateral) (R>L); Sacroiliac joint pain (B) (R>L); Vitamin D deficiency; DDD (degenerative disc disease), lumbar; Chronic knee pain (Secondary Area of pain) (Left); Lumbar foraminal stenosis(L4-5 and L5-S1) (Bilateral); Failed back surgical syndrome; Chronic lower extremity pain (Bilateral) (L>R); Long term (current) use of opiate analgesic; Long term prescription opiate use; Opiate use (60 MME/Day); Lumbar facet hypertrophy (Bilateral); Lumbar facet syndrome (Bilateral) (R>L); Neurogenic pain; and Constipation on his problem list. His primarily concern today is the Back Pain (low and right)  Pain Assessment: Location: Lower, Right Back Radiating:   Onset: More than a month ago Duration: Chronic pain Quality: Sharp, Constant, Aching Severity: 3 /10 (self-reported pain score)  Note: Reported level is compatible with observation.                    Effect on ADL:   Timing: Constant Modifying factors: rest, medications, biofreeze  Nathan Lambert was last scheduled for an appointment on 03/14/2017 for medication management. During today's appointment we reviewed Mr. Banh chronic pain status, as well as his outpatient  medication regimen. He admits that he continues to have back pain. He denies any radicular symptoms. He was to have a Lumbar facet block but states that he has not heard from anyone. He would like to have this to help relieve his pain. He continues to have constipation. He admits that he has not started the Amitza because he was afraid this would cause additional pain. He was told this by his wife's; friend.   The patient  reports that he does not use drugs. His body mass index is 37.66 kg/m.  Further details on both, my assessment(s), as well as the proposed treatment plan, please see below.  Controlled Substance Pharmacotherapy Assessment REMS (Risk Evaluation and Mitigation Strategy)  Analgesic: oxycodone 10 mg 4 times daily MME/day:60 mg/day.  Hart Rochester, RN  05/05/2017  9:59 AM  Sign at close encounter Nursing Pain Medication Assessment:  Safety precautions to be maintained throughout the outpatient stay will include: orient to surroundings, keep bed in low position, maintain call bell within reach at all times, provide assistance with transfer out of bed and ambulation.  Medication Inspection Compliance: Pill count conducted under aseptic conditions, in front of the patient. Neither the pills nor the bottle was removed from the patient's sight at any time. Once count was completed pills were immediately returned to the patient in their original bottle.  Medication: Oxycodone IR Pill/Patch Count: 56 of 120 pills remain Pill/Patch Appearance: Markings consistent with prescribed medication Bottle Appearance: Standard pharmacy container. Clearly labeled. Filled Date: 11 / 01 / 2018 Last Medication intake:  Today   Pharmacokinetics: Liberation and absorption (onset of action): WNL Distribution (time to peak effect): WNL Metabolism and excretion (duration of action): WNL  Pharmacodynamics: Desired effects: Analgesia: Nathan Lambert reports >50% benefit. Functional  ability: Patient reports that medication allows him to accomplish basic ADLs Clinically meaningful improvement in function (CMIF): Sustained CMIF goals met Perceived effectiveness: Described as relatively effective, allowing for increase in activities of daily living (ADL) Undesirable effects: Side-effects or Adverse reactions: None reported Monitoring: [place] PMP: Online review of the past 75-monthperiod conducted. Compliant with practice rules and regulations Last UDS on record: Summary  Date Value Ref Range Status  12/27/2016 FINAL  Final    Comment:    ==================================================================== [facility] COMP DRUG ANALYSIS,UR ==================================================================== Test                             Result       Flag       Units Drug Present and Declared for Prescription Verification   Oxycodone                      300          EXPECTED   ng/mg creat   Oxymorphone                    493          EXPECTED   ng/mg creat   Noroxycodone                   723          EXPECTED   ng/mg creat   Noroxymorphone                 129          EXPECTED   ng/mg creat    Sources of oxycodone are scheduled prescription medications.    Oxymorphone, noroxycodone, and noroxymorphone are expected    metabolites of oxycodone. Oxymorphone is also available as a    scheduled prescription medication.   Acetaminophen                  PRESENT      EXPECTED Drug Absent but Declared for Prescription Verification   Gabapentin                     Not Detected UNEXPECTED ==================================================================== Test                      Result    Flag   Units      Ref Range   Creatinine              257              mg/dL      >=20 ==================================================================== Declared Medications:  The flagging and interpretation on this report are based on the  following declared medications.  Unexpected  results may arise from  inaccuracies in the declared medications.  **Note: The testing scope of this panel includes these medications:  Gabapentin  Oxycodone (Percocet)  **Note: The testing scope of this panel does not include small to  moderate amounts of these reported medications:  Acetaminophen (Percocet)  **Note: The testing scope of this panel does not include following  reported medications:  Loratadine (Claritin) ==================================================================== For clinical consultation, please call [phone] ====================================================================    UDS interpretation: Compliant          Medication Assessment Form: Reviewed. Patient indicates being compliant with therapy Treatment compliance: Compliant Risk Assessment Profile: Aberrant  behavior: See prior evaluations. None observed or detected today Comorbid factors increasing risk of overdose: See prior notes. No additional risks detected today Risk of substance use disorder (SUD): Low     Opioid Risk Tool - 03/14/17 1028      Family History of Substance Abuse   Alcohol Negative   Illegal Drugs Negative   Rx Drugs Negative     Personal History of Substance Abuse   Alcohol Negative   Illegal Drugs Negative   Rx Drugs Negative     Age   Age between 50-45 years  Yes     History of Preadolescent Sexual Abuse   History of Preadolescent Sexual Abuse Negative or Male     Psychological Disease   Psychological Disease Negative   Depression Negative     Total Score   Opioid Risk Tool Scoring 1   Opioid Risk Interpretation Low Risk     ORT Scoring interpretation table:  Score <3 = Low Risk for SUD  Score between 4-7 = Moderate Risk for SUD  Score >8 = High Risk for Opioid Abuse   Risk Mitigation Strategies:  Patient Counseling: Covered Patient-Prescriber Agreement (PPA): Present and active  Notification to other healthcare providers:  Done  Pharmacologic Plan: No change in therapy, at this time  Laboratory Chemistry  Inflammation Markers (CRP: Acute Phase) (ESR: Chronic Phase) Lab Results  Component Value Date   CRP 8.5 (H) 12/27/2016   ESRSEDRATE 35 (H) 12/27/2016                 Renal Function Markers Lab Results  Component Value Date   BUN 6 12/27/2016   CREATININE 0.83 12/27/2016   GFRAA 133 12/27/2016   GFRNONAA 115 12/27/2016                 Hepatic Function Markers Lab Results  Component Value Date   AST 11 12/27/2016   ALT 15 12/27/2016   ALBUMIN 4.2 12/27/2016   ALKPHOS 56 12/27/2016                 Electrolytes Lab Results  Component Value Date   NA 144 12/27/2016   K 4.3 12/27/2016   CL 106 12/27/2016   CALCIUM 9.0 12/27/2016   MG 2.1 12/27/2016                 Neuropathy Markers Lab Results  Component Value Date   VITAMINB12 462 12/27/2016                 Bone Pathology Markers Lab Results  Component Value Date   ALKPHOS 56 12/27/2016   25OHVITD1 17 (L) 12/27/2016   25OHVITD2 <1.0 12/27/2016   25OHVITD3 17 12/27/2016   CALCIUM 9.0 12/27/2016                 Coagulation Parameters No results found for: INR, LABPROT, APTT, PLT               Cardiovascular Markers No results found for: BNP, HGB, HCT               Note: Lab results reviewed.  Recent Diagnostic Imaging Results  DG C-Arm 1-60 Min-No Report Fluoroscopy was utilized by the requesting physician.  No radiographic  interpretation.   Complexity Note: Imaging results reviewed. Results shared with Mr. Brinkley, using Layman's terms.                         Meds  Current Outpatient Prescriptions:  .  gabapentin (NEURONTIN) 300 MG capsule, Take 1-3 capsules (300-900 mg total) by mouth 3 (three) times daily. Follow written titration schedule., Disp: 270 capsule, Rfl: 0 .  loratadine (CLARITIN) 10 MG tablet, Take 10 mg by mouth daily as needed for allergies., Disp: , Rfl:  .  montelukast (SINGULAIR) 10 MG  tablet, Take 10 mg by mouth at bedtime., Disp: , Rfl:  .  naloxone (NARCAN) 2 MG/2ML injection, Inject content of syringe into thigh muscle. Call 911., Disp: 2 Syringe, Rfl: 1 .  [START ON 05/17/2017] Oxycodone HCl 10 MG TABS, Take 1 tablet (10 mg total) by mouth every 6 (six) hours as needed., Disp: 120 tablet, Rfl: 0  ROS  Constitutional: Denies any fever or chills Gastrointestinal: No reported hemesis, hematochezia, vomiting, or acute GI distress Musculoskeletal: Denies any acute onset joint swelling, redness, loss of ROM, or weakness Neurological: No reported episodes of acute onset apraxia, aphasia, dysarthria, agnosia, amnesia, paralysis, loss of coordination, or loss of consciousness  Allergies  Mr. Quinney is allergic to bactrim [sulfamethoxazole-trimethoprim].  PFSH  Drug: Mr. Schleyer  reports that he does not use drugs. Alcohol:  reports that he does not drink alcohol. Tobacco:  reports that he has been smoking Cigarettes.  He has a 10.00 pack-year smoking history. He has never used smokeless tobacco. Medical:  has a past medical history of Allergy (2012); Chronic low back pain (Primary Area of Pain) (Bilateral) (R>L); Degeneration of lumbar or lumbosacral intervertebral disc (L4-L5) (12/27/2016); Headache; and Opiate use (02/15/2017). Surgical: Mr. Stickler  has a past surgical history that includes Fracture surgery (Left); Spine surgery (Right, 06/16/2015); and Back surgery. Family: family history includes AAA (abdominal aortic aneurysm) in his mother; Diabetes in his father; Stroke in his mother.  Constitutional Exam  General appearance: alert, cooperative and moderately obese Vitals:   05/05/17 0934  BP: 129/69  Pulse: 73  Resp: 18  Temp: 97.9 F (36.6 C)  SpO2: 99%  Weight: 270 lb (122.5 kg)  Height: _0  (1.803 m)   BMI Assessment: Estimated body mass index is 37.66 kg/m as calculated from the following:   Height as of this encounter: _1  (1.803 m).    Weight as of this encounter: 270 lb (122.5 kg).   Psych/Mental status: Alert, oriented x 3 (person, place, & time)       Eyes: PERLA Respiratory: No evidence of acute respiratory distress  Lumbar Spine Area Exam  Skin & Axial Inspection: No masses, redness, or swelling Alignment: Symmetrical Functional ROM: Unrestricted ROM      Stability: No instability detected Muscle Tone/Strength: Functionally intact. No obvious neuro-muscular anomalies detected. Sensory (Neurological): Unimpaired Palpation: Complains of area being tender to palpation       Provocative Tests: Lumbar Hyperextension and rotation test: Positive       Lumbar Lateral bending test: evaluation deferred today       Patrick's Maneuver: evaluation deferred today                    Gait & Posture Assessment  Ambulation: Unassisted Gait: Relatively normal for age and body habitus Posture: WNL   Lower Extremity Exam    Side: Right lower extremity  Side: Left lower extremity  Skin & Extremity Inspection: Skin color, temperature, and hair growth are WNL. No peripheral edema or cyanosis. No masses, redness, swelling, asymmetry, or associated skin lesions. No contractures.  Skin & Extremity Inspection: Skin color, temperature, and hair growth are WNL. No  peripheral edema or cyanosis. No masses, redness, swelling, asymmetry, or associated skin lesions. No contractures.  Functional ROM: Unrestricted ROM          Functional ROM: Unrestricted ROM          Muscle Tone/Strength: Functionally intact. No obvious neuro-muscular anomalies detected.  Muscle Tone/Strength: Functionally intact. No obvious neuro-muscular anomalies detected.  Sensory (Neurological): Unimpaired  Sensory (Neurological): Unimpaired  Palpation: No palpable anomalies  Palpation: No palpable anomalies   Assessment  Primary Diagnosis & Pertinent Problem List: Diagnoses of Chronic low back pain (Primary Area of Pain) (Bilateral) (R>L), DDD (degenerative disc  disease), lumbar, Lumbar facet hypertrophy (Bilateral), Lumbar facet syndrome (Bilateral) (R>L), and Chronic pain syndrome were pertinent to this visit.  Status Diagnosis  Persistent Controlled Persistent 1. Chronic low back pain (Primary Area of Pain) (Bilateral) (R>L)   2. DDD (degenerative disc disease), lumbar   3. Lumbar facet hypertrophy (Bilateral)   4. Lumbar facet syndrome (Bilateral) (R>L)   5. Chronic pain syndrome     Problems updated and reviewed during this visit: No problems updated. Plan of Care  Pharmacotherapy (Medications Ordered): Meds ordered this encounter  Medications  . Oxycodone HCl 10 MG TABS    Sig: Take 1 tablet (10 mg total) by mouth every 6 (six) hours as needed.    Dispense:  120 tablet    Refill:  0    Do not place this medication, or any other prescription from our practice, on "Automatic Refill". Patient may have prescription filled one day early if pharmacy is closed on scheduled refill date. Do not fill until: 05/17/2017 To last until: 06/16/2017    Order Specific Question:   Supervising Provider    Answer:   Milinda Pointer [phone]   New Prescriptions   No medications on file   Medications administered today: Mr. Urieta had no medications administered during this visit. Lab-work, procedure(s), and/or referral(s): No orders of the defined types were placed in this encounter.  Imaging and/or referral(s): None  Interventional therapies: Planned, scheduled, and/or pending:   Diagnostic bilateral lumbar facet block#2 + bilateral sacroiliac joint block #1   Considering:   Diagnostic bilateral lumbar facet block Possible bilateral lumbar facet RFA Diagnostic bilateral sacroiliac joint block Possible bilateral sacroiliac joint RFA Diagnostic caudal epidural steroid injection + diagnostic epidurogram Possible Racz procedure Diagnostic left intra-articular knee injection with local anesthetic and steroid Possible left series  of 5 intra-articular Hyalgan knee injections Diagnostic left Genicular nerve block Possible left Genicular nerve RFA   Palliative PRN treatment(s):   None at this time   Provider-requested follow-up: Return in about 3 months (around 08/05/2017) for MedMgmt.  Future Appointments Date Time Provider [place]  05/19/2017 8:30 AM Milinda Pointer, MD [facility] None  05/31/2017 8:45 AM Vevelyn Francois, NP [facility] None   Primary Care Physician: Vidal Schwalbe, MD Location: [facility] Outpatient Pain Management Facility Note by: Vevelyn Francois NP Date: 05/05/2017; Time: 3:17 PM  Pain Score Disclaimer: We use the NRS-11 scale. This is a self-reported, subjective measurement of pain severity with only modest accuracy. It is used primarily to identify changes within a particular patient. It must be understood that outpatient pain scales are significantly less accurate that those used for research, where they can be applied under ideal controlled circumstances with minimal exposure to variables. In reality, the score is likely to be a combination of pain intensity and pain affect, where pain affect describes the degree of emotional arousal or changes in action readiness caused  by the sensory experience of pain. Factors such as social and work situation, setting, emotional state, anxiety levels, expectation, and prior pain experience may influence pain perception and show large inter-individual differences that may also be affected by time variables.  Patient instructions provided during this appointment: Patient Instructions    ____________________________________________________________________________________________  Medication Rules  Applies to: All patients receiving prescriptions (written or electronic).  Pharmacy of record: Pharmacy where electronic prescriptions will be sent. If written prescriptions are taken to a different pharmacy, please inform the nursing staff. The  pharmacy listed in the electronic medical record should be the one where you would like electronic prescriptions to be sent.  Prescription refills: Only during scheduled appointments. Applies to both, written and electronic prescriptions.  NOTE: The following applies primarily to controlled substances (Opioid* Pain Medications).   Patient's responsibilities: 1. Pain Pills: Bring all pain pills to every appointment (except for procedure appointments). 2. Pill Bottles: Bring pills in original pharmacy bottle. Always bring newest bottle. Bring bottle, even if empty. 3. Medication refills: You are responsible for knowing and keeping track of what medications you need refilled. The day before your appointment, write a list of all prescriptions that need to be refilled. Bring that list to your appointment and give it to the admitting nurse. Prescriptions will be written only during appointments. If you forget a medication, it will not be "Called in", "Faxed", or "electronically sent". You will need to get another appointment to get these prescribed. 4. Prescription Accuracy: You are responsible for carefully inspecting your prescriptions before leaving our office. Have the discharge nurse carefully go over each prescription with you, before taking them home. Make sure that your name is accurately spelled, that your address is correct. Check the name and dose of your medication to make sure it is accurate. Check the number of pills, and the written instructions to make sure they are clear and accurate. Make sure that you are given enough medication to last until your next medication refill appointment. 5. Taking Medication: Take medication as prescribed. Never take more pills than instructed. Never take medication more frequently than prescribed. Taking less pills or less frequently is permitted and encouraged, when it comes to controlled substances (written prescriptions).  6. Inform other Doctors: Always  inform, all of your healthcare providers, of all the medications you take. 7. Pain Medication from other Providers: You are not allowed to accept any additional pain medication from any other Doctor or Healthcare provider. There are two exceptions to this rule. (see below) In the event that you require additional pain medication, you are responsible for notifying us, as stated below. 8. Medication Agreement: You are responsible for carefully reading and following our Medication Agreement. This must be signed before receiving any prescriptions from our practice. Safely store a copy of your signed Agreement. Violations to the Agreement will result in no further prescriptions. (Additional copies of our Medication Agreement are available upon request.) 9. Laws, Rules, & Regulations: All patients are expected to follow all Federal and [employer], [employer], Rules, [employer]. Ignorance of the Laws does not constitute a valid excuse. The use of any illegal substances is prohibited. 10. Adopted CDC guidelines & recommendations: Target dosing levels will be at or below 60 MME/day. Use of benzodiazepines** is not recommended.  Exceptions: There are only two exceptions to the rule of not receiving pain medications from other Healthcare Providers. 1. Exception #1 (Emergencies): In the event of an emergency (i.e.: accident requiring emergency care), you are  allowed to receive additional pain medication. However, you are responsible for: As soon as you are able, call our office [phone], at any time of the day or night, and leave a message stating your name, the date and nature of the emergency, and the name and dose of the medication prescribed. In the event that your call is answered by a member of our staff, make sure to document and save the date, time, and the name of the person that took your information.  2. Exception #2 (Planned Surgery): In the event that you are scheduled by another doctor or dentist to  have any type of surgery or procedure, you are allowed (for a period no longer than 30 days), to receive additional pain medication, for the acute post-op pain. However, in this case, you are responsible for picking up a copy of our "Post-op Pain Management for Surgeons" handout, and giving it to your surgeon or dentist. This document is available at our office, and does not require an appointment to obtain it. Simply go to our office during business hours (Monday-Thursday from 8:00 AM to 4:00 PM) (Friday 8:00 AM to 12:00 Noon) or if you have a scheduled appointment with [place], prior to your surgery, and ask for it by name. In addition, you will need to provide [place] with your name, name of your surgeon, type of surgery, and date of procedure or surgery.  *Opioid medications include: morphine, codeine, oxycodone, oxymorphone, hydrocodone, hydromorphone, meperidine, tramadol, tapentadol, buprenorphine, fentanyl, methadone. **Benzodiazepine medications include: diazepam (Valium), alprazolam (Xanax), clonazepam (Klonopine), lorazepam (Ativan), clorazepate (Tranxene), chlordiazepoxide (Librium), estazolam (Prosom), oxazepam (Serax), temazepam (Restoril), triazolam (Halcion)  ____________________________________________________________________________________________  BMI Assessment: Estimated body mass index is 37.66 kg/m as calculated from the following:   Height as of this encounter: _0  (1.803 m).   Weight as of this encounter: 270 lb (122.5 kg).  BMI interpretation table: BMI level Category Range association with higher incidence of chronic pain  <18 kg/m2 Underweight   18.5-24.9 kg/m2 Ideal body weight   25-29.9 kg/m2 Overweight Increased incidence by 20%  30-34.9 kg/m2 Obese (Class I) Increased incidence by 68%  35-39.9 kg/m2 Severe obesity (Class II) Increased incidence by 136%  >40 kg/m2 Extreme obesity (Class III) Increased incidence by 254%   BMI Readings from Last 4 Encounters:  05/05/17  37.66 kg/m  04/06/17 38.63 kg/m  03/14/17 38.63 kg/m  03/08/17 38.63 kg/m   Wt Readings from Last 4 Encounters:  05/05/17 270 lb (122.5 kg)  04/06/17 277 lb (125.6 kg)  03/14/17 277 lb (125.6 kg)  03/08/17 277 lb (125.6 kg)  Pain Management Discharge Instructions  General Discharge Instructions :  If you need to reach your doctor call: Monday-Friday 8:00 am - 4:00 pm at [phone] or toll free [phone].  After clinic hours [phone] to have operator reach doctor.  Bring all of your medication bottles to all your appointments in the pain clinic.  To cancel or reschedule your appointment with Pain Management please remember to call 24 hours in advance to avoid a fee.  Refer to the educational materials which you have been given on: General Risks, I had my Procedure. Discharge Instructions, Post Sedation.  Post Procedure Instructions:  The drugs you were given will stay in your system until tomorrow, so for the next 24 hours you should not drive, make any legal decisions or drink any alcoholic beverages.  You may eat anything you prefer, but it is better to start with liquids then soups and crackers, and  gradually work up to solid foods.  Please notify your doctor immediately if you have any unusual bleeding, trouble breathing or pain that is not related to your normal pain.  Depending on the type of procedure that was done, some parts of your body may feel week and/or numb.  This usually clears up by tonight or the next day.  Walk with the use of an assistive device or accompanied by an adult for the 24 hours.  You may use ice on the affected area for the first 24 hours.  Put ice in a Ziploc bag and cover with a towel and place against area 15 minutes on 15 minutes off.  You may switch to heat after 24 hours.GENERAL RISKS AND COMPLICATIONS  What are the risk, side effects and possible complications? Generally speaking, most procedures are safe.  However, with any  procedure there are risks, side effects, and the possibility of complications.  The risks and complications are dependent upon the sites that are lesioned, or the type of nerve block to be performed.  The closer the procedure is to the spine, the more serious the risks are.  Great care is taken when placing the radio frequency needles, block needles or lesioning probes, but sometimes complications can occur. 1. Infection: Any time there is an injection through the skin, there is a risk of infection.  This is why sterile conditions are used for these blocks.  There are four possible types of infection. 1. Localized skin infection. 2. Central Nervous System Infection-This can be in the form of Meningitis, which can be deadly. 3. Epidural Infections-This can be in the form of an epidural abscess, which can cause pressure inside of the spine, causing compression of the spinal cord with subsequent paralysis. This would require an emergency surgery to decompress, and there are no guarantees that the patient would recover from the paralysis. 4. Discitis-This is an infection of the intervertebral discs.  It occurs in about 1% of discography procedures.  It is difficult to treat and it may lead to surgery.        2. Pain: the needles have to go through skin and soft tissues, will cause soreness.       3. Damage to internal structures:  The nerves to be lesioned may be near blood vessels or    other nerves which can be potentially damaged.       4. Bleeding: Bleeding is more common if the patient is taking blood thinners such as  aspirin, Coumadin, Ticiid, Plavix, etc., or if he/she have some genetic predisposition  such as hemophilia. Bleeding into the spinal canal can cause compression of the spinal  cord with subsequent paralysis.  This would require an emergency surgery to  decompress and there are no guarantees that the patient would recover from the  paralysis.       5. Pneumothorax:  Puncturing of a lung is  a possibility, every time a needle is introduced in  the area of the chest or upper back.  Pneumothorax refers to free air around the  collapsed lung(s), inside of the thoracic cavity (chest cavity).  Another two possible  complications related to a similar event would include: Hemothorax and Chylothorax.   These are variations of the Pneumothorax, where instead of air around the collapsed  lung(s), you may have blood or chyle, respectively.       6. Spinal headaches: They may occur with any procedures in the area of the spine.  7. Persistent CSF (Cerebro-Spinal Fluid) leakage: This is a rare problem, but may occur  with prolonged intrathecal or epidural catheters either due to the formation of a fistulous  track or a dural tear.       8. Nerve damage: By working so close to the spinal cord, there is always a possibility of  nerve damage, which could be as serious as a permanent spinal cord injury with  paralysis.       9. Death:  Although rare, severe deadly allergic reactions known as "Anaphylactic  reaction" can occur to any of the medications used.      10. Worsening of the symptoms:  We can always make thing worse.  What are the chances of something like this happening? Chances of any of this occuring are extremely low.  By statistics, you have more of a chance of getting killed in a motor vehicle accident: while driving to the hospital than any of the above occurring .  Nevertheless, you should be aware that they are possibilities.  In general, it is similar to taking a shower.  Everybody knows that you can slip, hit your head and get killed.  Does that mean that you should not shower again?  Nevertheless always keep in mind that statistics do not mean anything if you happen to be on the wrong side of them.  Even if a procedure has a 1 (one) in a 1,000,000 (million) chance of going wrong, it you happen to be that one..Also, keep in mind that by statistics, you have more of a chance of having  something go wrong when taking medications.  Who should not have this procedure? If you are on a blood thinning medication (e.g. Coumadin, Plavix, see list of "Blood Thinners"), or if you have an active infection going on, you should not have the procedure.  If you are taking any blood thinners, please inform your physician.  How should I prepare for this procedure?  Do not eat or drink anything at least six hours prior to the procedure.  Bring a driver with you .  It cannot be a taxi.  Come accompanied by an adult that can drive you back, and that is strong enough to help you if your legs get weak or numb from the local anesthetic.  Take all of your medicines the morning of the procedure with just enough water to swallow them.  If you have diabetes, make sure that you are scheduled to have your procedure done first thing in the morning, whenever possible.  If you have diabetes, take only half of your insulin dose and notify our nurse that you have done so as soon as you arrive at the clinic.  If you are diabetic, but only take blood sugar pills (oral hypoglycemic), then do not take them on the morning of your procedure.  You may take them after you have had the procedure.  Do not take aspirin or any aspirin-containing medications, at least eleven (11) days prior to the procedure.  They may prolong bleeding.  Wear loose fitting clothing that may be easy to take off and that you would not mind if it got stained with Betadine or blood.  Do not wear any jewelry or perfume  Remove any nail coloring.  It will interfere with some of our monitoring equipment.  NOTE: Remember that this is not meant to be interpreted as a complete list of all possible complications.  Unforeseen problems may occur.  BLOOD THINNERS The  following drugs contain aspirin or other products, which can cause increased bleeding during surgery and should not be taken for 2 weeks prior to and 1 week after surgery.  If you  should need take something for relief of minor pain, you may take acetaminophen which is found in Tylenol,m Datril, Anacin-3 and Panadol. It is not blood thinner. The products listed below are.  Do not take any of the products listed below in addition to any listed on your instruction sheet.  A.P.C or A.P.C with Codeine Codeine Phosphate Capsules #3 Ibuprofen Ridaura  ABC compound Congesprin Imuran rimadil  Advil Cope Indocin Robaxisal  Alka-Seltzer Effervescent Pain Reliever and Antacid Coricidin or Coricidin-D  Indomethacin Rufen  Alka-Seltzer plus Cold Medicine Cosprin Ketoprofen S-A-C Tablets  Anacin Analgesic Tablets or Capsules Coumadin Korlgesic Salflex  Anacin Extra Strength Analgesic tablets or capsules CP-2 Tablets Lanoril Salicylate  Anaprox Cuprimine Capsules Levenox Salocol  Anexsia-D Dalteparin Magan Salsalate  Anodynos Darvon compound Magnesium Salicylate Sine-off  Ansaid Dasin Capsules Magsal Sodium Salicylate  Anturane Depen Capsules Marnal Soma  APF Arthritis pain formula Dewitt's Pills Measurin Stanback  Argesic Dia-Gesic Meclofenamic Sulfinpyrazone  Arthritis Bayer Timed Release Aspirin Diclofenac Meclomen Sulindac  Arthritis pain formula Anacin Dicumarol Medipren Supac  Analgesic (Safety coated) Arthralgen Diffunasal Mefanamic Suprofen  Arthritis Strength Bufferin Dihydrocodeine Mepro Compound Suprol  Arthropan liquid Dopirydamole Methcarbomol with Aspirin Synalgos  ASA tablets/Enseals Disalcid Micrainin Tagament  Ascriptin Doan's Midol Talwin  Ascriptin A/D Dolene Mobidin Tanderil  Ascriptin Extra Strength Dolobid Moblgesic Ticlid  Ascriptin with Codeine Doloprin or Doloprin with Codeine Momentum Tolectin  Asperbuf Duoprin Mono-gesic Trendar  Aspergum Duradyne Motrin or Motrin IB Triminicin  Aspirin plain, buffered or enteric coated Durasal Myochrisine Trigesic  Aspirin Suppositories Easprin Nalfon Trillsate  Aspirin with Codeine Ecotrin Regular or Extra Strength  Naprosyn Uracel  Atromid-S Efficin Naproxen Ursinus  Auranofin Capsules Elmiron Neocylate Vanquish  Axotal Emagrin Norgesic Verin  Azathioprine Empirin or Empirin with Codeine Normiflo Vitamin E  Azolid Emprazil Nuprin Voltaren  Bayer Aspirin plain, buffered or children's or timed BC Tablets or powders Encaprin Orgaran Warfarin Sodium  Buff-a-Comp Enoxaparin Orudis Zorpin  Buff-a-Comp with Codeine Equegesic Os-Cal-Gesic   Buffaprin Excedrin plain, buffered or Extra Strength Oxalid   Bufferin Arthritis Strength Feldene Oxphenbutazone   Bufferin plain or Extra Strength Feldene Capsules Oxycodone with Aspirin   Bufferin with Codeine Fenoprofen Fenoprofen Pabalate or Pabalate-SF   Buffets II Flogesic Panagesic   Buffinol plain or Extra Strength Florinal or Florinal with Codeine Panwarfarin   Buf-Tabs Flurbiprofen Penicillamine   Butalbital Compound Four-way cold tablets Penicillin   Butazolidin Fragmin Pepto-Bismol   Carbenicillin Geminisyn Percodan   Carna Arthritis Reliever Geopen Persantine   Carprofen Gold's salt Persistin   Chloramphenicol Goody's Phenylbutazone   Chloromycetin Haltrain Piroxlcam   Clmetidine heparin Plaquenil   Cllnoril Hyco-pap Ponstel   Clofibrate Hydroxy chloroquine Propoxyphen         Before stopping any of these medications, be sure to consult the physician who ordered them.  Some, such as Coumadin (Warfarin) are ordered to prevent or treat serious conditions such as "deep thrombosis", "pumonary embolisms", and other heart problems.  The amount of time that you may need off of the medication may also vary with the medication and the reason for which you were taking it.  If you are taking any of these medications, please make sure you notify your pain physician before you undergo any procedures.          Facet Joint Block  The facet joints connect the bones of the spine (vertebrae). They make it possible for you to bend, twist, and make other movements  with your spine. They also keep you from bending too far, twisting too far, and making other excessive movements. A facet joint block is a procedure where a numbing medicine (anesthetic) is injected into a facet joint. Often, a type of anti-inflammatory medicine called a steroid is also injected. A facet joint block may be done to diagnose neck or back pain. If the pain gets better after a facet joint block, it means the pain is probably coming from the facet joint. If the pain does not get better, it means the pain is probably not coming from the facet joint. A facet joint block may also be done to relieve neck or back pain caused by an inflamed facet joint. A facet joint block is only done to relieve pain if the pain does not improve with other methods, such as medicine, exercise programs, and physical therapy. Tell a health care provider about:  Any allergies you have.  All medicines you are taking, including vitamins, herbs, eye drops, creams, and over-the-counter medicines.  Any problems you or family members have had with anesthetic medicines.  Any blood disorders you have.  Any surgeries you have had.  Any medical conditions you have.  Whether you are pregnant or may be pregnant. What are the risks? Generally, this is a safe procedure. However, problems may occur, including:  Bleeding.  Injury to a nerve near the injection site.  Pain at the injection site.  Weakness or numbness in areas controlled by nerves near the injection site.  Infection.  Temporary fluid retention.  Allergic reactions to medicines or dyes.  Injury to other structures or organs near the injection site.  What happens before the procedure?  Follow instructions from your health care provider about eating or drinking restrictions.  Ask your health care provider about: ? Changing or stopping your regular medicines. This is especially important if you are taking diabetes medicines or blood  thinners. ? Taking medicines such as aspirin and ibuprofen. These medicines can thin your blood. Do not take these medicines before your procedure if your health care provider instructs you not to.  Do not take any new dietary supplements or medicines without asking your health care provider first.  Plan to have someone take you home after the procedure. What happens during the procedure?  You may need to remove your clothing and dress in an open-back gown.  The procedure will be done while you are lying on an X-ray table. You will most likely be asked to lie on your stomach, but you may be asked to lie in a different position if an injection will be made in your neck.  Machines will be used to monitor your oxygen levels, heart rate, and blood pressure.  If an injection will be made in your neck, an IV tube will be inserted into one of your veins. Fluids and medicine will flow directly into your body through the IV tube.  The area over the facet joint where the injection will be made will be cleaned with soap. The surrounding skin will be covered with clean drapes.  A numbing medicine (local anesthetic) will be applied to your skin. Your skin may sting or burn for a moment.  A video X-ray machine (fluoroscopy) will be used to locate the joint. In some cases, a CT scan may be used.  A contrast dye  may be injected into the facet joint area to help locate the joint.  When the joint is located, an anesthetic will be injected into the joint through the needle.  Your health care provider will ask you whether you feel pain relief. If you do feel relief, a steroid may be injected to provide pain relief for a longer period of time. If you do not feel relief or feel only partial relief, additional injections of an anesthetic may be made in other facet joints.  The needle will be removed.  Your skin will be cleaned.  A bandage (dressing) will be applied over each injection site. The procedure  may vary among health care providers and hospitals. What happens after the procedure?  You will be observed for 15-30 minutes before being allowed to go home. This information is not intended to replace advice given to you by your health care provider. Make sure you discuss any questions you have with your health care provider. Document Released: 11/24/2006 Document Revised: 08/06/2015 Document Reviewed: 03/31/2015 [employer] Interactive Patient Education  [employer].

## 2017-05-09 ENCOUNTER — Ambulatory Visit: Payer: BLUE CROSS/BLUE SHIELD | Admitting: Nurse Practitioner

## 2017-05-18 NOTE — Progress Notes (Signed)
Patient's Name: Nathan FlockCarlton L Joye Jr.  MRN: [id number]  Referring Provider: Smith RobertKikel, Stephen, MD  DOB: [birth year]  PCP: Smith RobertKikel, Stephen, MD  DOS: 05/19/2017  Note by: Oswaldo DoneFrancisco A Mako Pelfrey, MD  Service setting: Ambulatory outpatient  Specialty: Interventional Pain Management  Patient type: Established  Location: [facility] (AMB) Pain Management Facility  Visit type: Interventional Procedure   Primary Reason for Visit: Interventional Pain Management Treatment. CC: Back Pain (low)  Procedure:  Anesthesia, Analgesia, Anxiolysis:  Procedure #1: Type: Diagnostic Medial Branch Facet Block #2 Region: Lumbar Level: L2, L3, L4, L5, & S1 Medial Branch Level(s) Laterality: Bilateral  Procedure #2: Type: Diagnostic Sacroiliac Joint Block #1 Region: Posterior Lumbosacral Level: PSIS (Posterior Superior Iliac Spine) Sacroiliac Joint Laterality: Bilateral  Type: Local Anesthesia with Moderate (Conscious) Sedation Local Anesthetic: Lidocaine 1% Route: Intravenous (IV) IV Access: Secured Sedation: Meaningful verbal contact was maintained at all times during the procedure  Indication(s): Analgesia and Anxiety   Indications: 1. Lumbar facet syndrome (Bilateral) (R>L)   2. Lumbar facet hypertrophy (Bilateral)   3. Sacroiliac joint pain (Bilateral) (R>L)   4. Chronic low back pain (Primary Area of Pain) (Bilateral) (R>L)    Pain Score: Pre-procedure: 3 /10 Post-procedure: 0-No pain/10  Pre-op Assessment:  Nathan Lambert is a 36 y.o. (year old), male patient, seen today for interventional treatment. He  has a past surgical history that includes Fracture surgery (Left); Spine surgery (Right, 06/16/2015); and Back surgery. Nathan Lambert has a current medication list which includes the following prescription(s): loratadine, montelukast, naloxone, oxycodone hcl, and gabapentin, and the following Facility-Administered Medications: fentanyl, lactated ringers, and midazolam. His primarily concern today is the  Back Pain (low)  Initial Vital Signs: There were no vitals taken for this visit. BMI: Estimated body mass index is 38.22 kg/m as calculated from the following:   Height as of this encounter: 5\' 11"  (1.803 m).   Weight as of this encounter: 274 lb (124.3 kg).  Risk Assessment: Allergies: Reviewed. He is allergic to bactrim [sulfamethoxazole-trimethoprim].  Allergy Precautions: None required Coagulopathies: Reviewed. None identified.  Blood-thinner therapy: None at this time Active Infection(s): Reviewed. None identified. Nathan Lambert is afebrile  Site Confirmation: Nathan Lambert was asked to confirm the procedure and laterality before marking the site Procedure checklist: Completed Consent: Before the procedure and under the influence of no sedative(s), amnesic(s), or anxiolytics, the patient was informed of the treatment options, risks and possible complications. To fulfill our ethical and legal obligations, as recommended by the American Medical Association's Code of Ethics, I have informed the patient of my clinical impression; the nature and purpose of the treatment or procedure; the risks, benefits, and possible complications of the intervention; the alternatives, including doing nothing; the risk(s) and benefit(s) of the alternative treatment(s) or procedure(s); and the risk(s) and benefit(s) of doing nothing. The patient was provided information about the general risks and possible complications associated with the procedure. These may include, but are not limited to: failure to achieve desired goals, infection, bleeding, organ or nerve damage, allergic reactions, paralysis, and death. In addition, the patient was informed of those risks and complications associated to Spine-related procedures, such as failure to decrease pain; infection (i.e.: Meningitis, epidural or intraspinal abscess); bleeding (i.e.: epidural hematoma, subarachnoid hemorrhage, or any other type of intraspinal or  peri-dural bleeding); organ or nerve damage (i.e.: Any type of peripheral nerve, nerve root, or spinal cord injury) with subsequent damage to sensory, motor, and/or autonomic systems, resulting in permanent pain, numbness, and/or weakness of one or  several areas of the body; allergic reactions; (i.e.: anaphylactic reaction); and/or death. Furthermore, the patient was informed of those risks and complications associated with the medications. These include, but are not limited to: allergic reactions (i.e.: anaphylactic or anaphylactoid reaction(s)); adrenal axis suppression; blood sugar elevation that in diabetics may result in ketoacidosis or comma; water retention that in patients with history of congestive heart failure may result in shortness of breath, pulmonary edema, and decompensation with resultant heart failure; weight gain; swelling or edema; medication-induced neural toxicity; particulate matter embolism and blood vessel occlusion with resultant organ, and/or nervous system infarction; and/or aseptic necrosis of one or more joints. Finally, the patient was informed that Medicine is not an exact science; therefore, there is also the possibility of unforeseen or unpredictable risks and/or possible complications that may result in a catastrophic outcome. The patient indicated having understood very clearly. We have given the patient no guarantees and we have made no promises. Enough time was given to the patient to ask questions, all of which were answered to the patient's satisfaction. Nathan Lambert has indicated that he wanted to continue with the procedure. Attestation: I, the ordering provider, attest that I have discussed with the patient the benefits, risks, side-effects, alternatives, likelihood of achieving goals, and potential problems during recovery for the procedure that I have provided informed consent. Date: 05/19/2017; Time: 8:23 AM  Pre-Procedure Preparation:  Monitoring: As per clinic  protocol. Respiration, ETCO2, SpO2, BP, heart rate and rhythm monitor placed and checked for adequate function Safety Precautions: Patient was assessed for positional comfort and pressure points before starting the procedure. Time-out: I initiated and conducted the "Time-out" before starting the procedure, as per protocol. The patient was asked to participate by confirming the accuracy of the "Time Out" information. Verification of the correct person, site, and procedure were performed and confirmed by me, the nursing staff, and the patient. "Time-out" conducted as per Joint Commission's Universal Protocol (UP.01.01.01). "Time-out" Date & Time: 05/19/2017; 0950 hrs.  Description of Procedure #1 Process:   Time-out: "Time-out" completed before starting procedure, as per protocol. Position: Prone Target Area: For Lumbar Facet blocks, the target is the groove formed by the junction of the transverse process and superior articular process. For the L5 dorsal ramus, the target is the notch between superior articular process and sacral ala. For the S1 dorsal ramus, the target is the superior and lateral edge of the posterior S1 Sacral foramen. Approach: Paramedial approach. Area Prepped: Entire Posterior Lumbosacral Region Prepping solution: ChloraPrep (2% chlorhexidine gluconate and 70% isopropyl alcohol) Safety Precautions: Aspiration looking for blood return was conducted prior to all injections. At no point did we inject any substances, as a needle was being advanced. No attempts were made at seeking any paresthesias. Safe injection practices and needle disposal techniques used. Medications properly checked for expiration dates. SDV (single dose vial) medications used.  Description of the Procedure: Protocol guidelines were followed. The patient was placed in position over the fluoroscopy table. The target area was identified and the area prepped in the usual manner. Skin desensitized using vapocoolant  spray. Skin & deeper tissues infiltrated with local anesthetic. Appropriate amount of time allowed to pass for local anesthetics to take effect. The procedure needle was introduced through the skin, ipsilateral to the reported pain, and advanced to the target area. Employing the "Medial Branch Technique", the needles were advanced to the angle made by the superior and medial portion of the transverse process, and the lateral and inferior portion of  the superior articulating process of the targeted vertebral bodies. This area is known as "Burton's Eye" or the "Eye of the [place] Dog". A procedure needle was introduced through the skin, and this time advanced to the angle made by the superior and medial border of the sacral ala, and the lateral border of the S1 vertebral body. This last needle was later repositioned at the superior and lateral border of the posterior S1 foramen. Negative aspiration confirmed. Solution injected in intermittent fashion, asking for systemic symptoms every 0.5cc of injectate. The needles were then removed and the area cleansed, making sure to leave some of the prepping solution back to take advantage of its long term bactericidal properties. Start Time: 1001 hrs. Materials:  Needle(s) Type: Regular needle Gauge: 22G Length: 3.5-in Medication(s): We administered midazolam, fentaNYL, lidocaine, triamcinolone acetonide, ropivacaine (PF) 2 mg/mL (0.2%), triamcinolone acetonide, ropivacaine (PF) 2 mg/mL (0.2%), ropivacaine (PF) 2 mg/mL (0.2%), and methylPREDNISolone acetate. Please see chart orders for dosing details.  Description of Procedure # 2 Process:   Position: Prone Target Area: For upper sacroiliac joint block(s), the target is the superior and posterior margin of the sacroiliac joint. Approach: Ipsilateral approach. Area Prepped: Entire Posterior Lumbosacral Region Prepping solution: ChloraPrep (2% chlorhexidine gluconate and 70% isopropyl alcohol) Safety Precautions:  Aspiration looking for blood return was conducted prior to all injections. At no point did we inject any substances, as a needle was being advanced. No attempts were made at seeking any paresthesias. Safe injection practices and needle disposal techniques used. Medications properly checked for expiration dates. SDV (single dose vial) medications used. Description of the Procedure: Protocol guidelines were followed. The patient was placed in position over the fluoroscopy table. The target area was identified and the area prepped in the usual manner. Skin desensitized using vapocoolant spray. Skin & deeper tissues infiltrated with local anesthetic. Appropriate amount of time allowed to pass for local anesthetics to take effect. The procedure needle was advanced under fluoroscopic guidance into the sacroiliac joint until a firm endpoint was obtained. Proper needle placement secured. Negative aspiration confirmed. Solution injected in intermittent fashion, asking for systemic symptoms every 0.5cc of injectate. The needles were then removed and the area cleansed, making sure to leave some of the prepping solution back to take advantage of its long term bactericidal properties. Vitals:   05/19/17 1009 05/19/17 1016 05/19/17 1026 05/19/17 1033  BP: 125/77 121/73 120/70 133/80  Pulse: 69     Resp: (!) 21 18 19 15   Temp:      SpO2: 97% 97% 95% 97%  Weight:      Height:        End Time: 1008 hrs. Materials:  Needle(s) Type: Regular needle Gauge: 22G Length: 3.5-in Medication(s): We administered midazolam, fentaNYL, lidocaine, triamcinolone acetonide, ropivacaine (PF) 2 mg/mL (0.2%), triamcinolone acetonide, ropivacaine (PF) 2 mg/mL (0.2%), ropivacaine (PF) 2 mg/mL (0.2%), and methylPREDNISolone acetate. Please see chart orders for dosing details.  Imaging Guidance (Spinal):  Type of Imaging Technique: Fluoroscopy Guidance (Spinal) Indication(s): Assistance in needle guidance and placement for procedures  requiring needle placement in or near specific anatomical locations not easily accessible without such assistance. Exposure Time: Please see nurses notes. Contrast: None used. Fluoroscopic Guidance: I was personally present during the use of fluoroscopy. "Tunnel Vision Technique" used to obtain the best possible view of the target area. Parallax error corrected before commencing the procedure. "Direction-depth-direction" technique used to introduce the needle under continuous pulsed fluoroscopy. Once target was reached, antero-posterior, oblique, and lateral fluoroscopic  projection used confirm needle placement in all planes. Images permanently stored in EMR. Interpretation: No contrast injected. I personally interpreted the imaging intraoperatively. Adequate needle placement confirmed in multiple planes. Permanent images saved into the patient's record.  Antibiotic Prophylaxis:  Indication(s): None identified Antibiotic given: None  Post-operative Assessment:  EBL: None Complications: No immediate post-treatment complications observed by team, or reported by patient. Note: The patient tolerated the entire procedure well. A repeat set of vitals were taken after the procedure and the patient was kept under observation following institutional policy, for this type of procedure. Post-procedural neurological assessment was performed, showing return to baseline, prior to discharge. The patient was provided with post-procedure discharge instructions, including a section on how to identify potential problems. Should any problems arise concerning this procedure, the patient was given instructions to immediately contact us, at any time, without hesitation. In any case, we plan to contact the patient by telephone for a follow-up status report regarding this interventional procedure. Comments:  No additional relevant information.  Plan of Care    Imaging Orders     DG C-Arm 1-60 Min-No Report  Procedure  Orders     LUMBAR FACET(MEDIAL BRANCH NERVE BLOCK) MBNB     SACROILIAC JOINT INJECTINS  Medications ordered for procedure: Meds ordered this encounter  Medications  . lactated ringers infusion 1,000 mL  . midazolam (VERSED) 5 MG/5ML injection 1-2 mg    Make sure Flumazenil is available in the pyxis when using this medication. If oversedation occurs, administer 0.2 mg IV over 15 sec. If after 45 sec no response, administer 0.2 mg again over 1 min; may repeat at 1 min intervals; not to exceed 4 doses (1 mg)  . fentaNYL (SUBLIMAZE) injection 25-50 mcg    Make sure Narcan is available in the pyxis when using this medication. In the event of respiratory depression (RR< 8/min): Titrate NARCAN (naloxone) in increments of 0.1 to 0.2 mg IV at 2-3 minute intervals, until desired degree of reversal.  . lidocaine (XYLOCAINE) 2 % (with pres) injection 200 mg  . triamcinolone acetonide (KENALOG-40) injection 40 mg  . ropivacaine (PF) 2 mg/mL (0.2%) (NAROPIN) injection 9 mL  . triamcinolone acetonide (KENALOG-40) injection 40 mg  . ropivacaine (PF) 2 mg/mL (0.2%) (NAROPIN) injection 9 mL  . ropivacaine (PF) 2 mg/mL (0.2%) (NAROPIN) injection 9 mL  . methylPREDNISolone acetate (DEPO-MEDROL) injection 80 mg   Medications administered: We administered midazolam, fentaNYL, lidocaine, triamcinolone acetonide, ropivacaine (PF) 2 mg/mL (0.2%), triamcinolone acetonide, ropivacaine (PF) 2 mg/mL (0.2%), ropivacaine (PF) 2 mg/mL (0.2%), and methylPREDNISolone acetate.  See the medical record for exact dosing, route, and time of administration.  New Prescriptions   No medications on file   Disposition: Discharge home  Discharge Date & Time: 05/19/2017; 1038 hrs.   Physician-requested Follow-up: Return for post-procedure eval by Dr. Laban Emperor in 2 wks. Future Appointments Date Time Provider Department Center  06/08/2017 9:15 AM Delano Metz, MD [facility] None  06/15/2017 10:45 AM Barbette Merino, NP  [facility] None   Primary Care Physician: Smith Robert, MD Location: [facility] Note by: Oswaldo Done, MD Date: 05/19/2017; Time: 11:02 AM  Disclaimer:  Medicine is not an exact science. The only guarantee in medicine is that nothing is guaranteed. It is important to note that the decision to proceed with this intervention was based on the information collected from the patient. The Data and conclusions were drawn from the patient's questionnaire, the interview, and the physical examination. Because the  information was provided in large part by the patient, it cannot be guaranteed that it has not been purposely or unconsciously manipulated. Every effort has been made to obtain as much relevant data as possible for this evaluation. It is important to note that the conclusions that lead to this procedure are derived in large part from the available data. Always take into account that the treatment will also be dependent on availability of resources and existing treatment guidelines, considered by other Pain Management Practitioners as being common knowledge and practice, at the time of the intervention. For Medico-Legal purposes, it is also important to point out that variation in procedural techniques and pharmacological choices are the acceptable norm. The indications, contraindications, technique, and results of the above procedure should only be interpreted and judged by a Board-Certified Interventional Pain Specialist with extensive familiarity and expertise in the same exact procedure and technique.

## 2017-05-19 ENCOUNTER — Encounter: Payer: Self-pay | Admitting: Pain Medicine

## 2017-05-19 ENCOUNTER — Ambulatory Visit (HOSPITAL_BASED_OUTPATIENT_CLINIC_OR_DEPARTMENT_OTHER): Payer: BLUE CROSS/BLUE SHIELD | Admitting: Pain Medicine

## 2017-05-19 ENCOUNTER — Ambulatory Visit
Admission: RE | Admit: 2017-05-19 | Discharge: 2017-05-19 | Disposition: A | Discharge status: Home / Self Care | Payer: BLUE CROSS/BLUE SHIELD | Source: Ambulatory Visit | Attending: Pain Medicine | Admitting: Pain Medicine

## 2017-05-19 VITALS — BP 133/80 | HR 69 | Temp 98.4°F | Resp 15 | Ht 71.0 in | Wt 274.0 lb

## 2017-05-19 DIAGNOSIS — M47896 Other spondylosis, lumbar region: Secondary | ICD-10-CM | POA: Diagnosis not present

## 2017-05-19 DIAGNOSIS — M47816 Spondylosis without myelopathy or radiculopathy, lumbar region: Secondary | ICD-10-CM | POA: Insufficient documentation

## 2017-05-19 DIAGNOSIS — M533 Sacrococcygeal disorders, not elsewhere classified: Secondary | ICD-10-CM | POA: Diagnosis not present

## 2017-05-19 DIAGNOSIS — G8929 Other chronic pain: Secondary | ICD-10-CM

## 2017-05-19 DIAGNOSIS — M5442 Lumbago with sciatica, left side: Secondary | ICD-10-CM

## 2017-05-19 DIAGNOSIS — M5441 Lumbago with sciatica, right side: Secondary | ICD-10-CM | POA: Insufficient documentation

## 2017-05-19 MED ORDER — TRIAMCINOLONE ACETONIDE 40 MG/ML IJ SUSP
40.0000 mg | Freq: Once | INTRAMUSCULAR | Status: AC
  Administered 2017-05-19: 40 mg

## 2017-05-19 MED ORDER — ROPIVACAINE HCL 2 MG/ML IJ SOLN
INTRAMUSCULAR | Status: AC
  Filled 2017-05-19: qty 20

## 2017-05-19 MED ORDER — FENTANYL CITRATE (PF) 100 MCG/2ML IJ SOLN
25.0000 ug | INTRAMUSCULAR | Status: DC | PRN
  Administered 2017-05-19: 100 ug via INTRAVENOUS

## 2017-05-19 MED ORDER — METHYLPREDNISOLONE ACETATE 80 MG/ML IJ SUSP
INTRAMUSCULAR | Status: AC
  Filled 2017-05-19: qty 1

## 2017-05-19 MED ORDER — ROPIVACAINE HCL 2 MG/ML IJ SOLN
INTRAMUSCULAR | Status: AC
  Filled 2017-05-19: qty 10

## 2017-05-19 MED ORDER — ROPIVACAINE HCL 2 MG/ML IJ SOLN
9.0000 mL | Freq: Once | INTRAMUSCULAR | Status: AC
  Administered 2017-05-19: 10 mL via INTRA_ARTICULAR

## 2017-05-19 MED ORDER — ROPIVACAINE HCL 2 MG/ML IJ SOLN
9.0000 mL | Freq: Once | INTRAMUSCULAR | Status: AC
  Administered 2017-05-19: 10 mL via PERINEURAL

## 2017-05-19 MED ORDER — LACTATED RINGERS IV SOLN: 1000.0000 mL | Freq: Once | INTRAVENOUS | Status: DC

## 2017-05-19 MED ORDER — LIDOCAINE HCL 2 % IJ SOLN
10.0000 mL | Freq: Once | INTRAMUSCULAR | Status: AC
  Administered 2017-05-19: 400 mg

## 2017-05-19 MED ORDER — MIDAZOLAM HCL 5 MG/5ML IJ SOLN
1.0000 mg | INTRAMUSCULAR | Status: DC | PRN
  Administered 2017-05-19: 5 mg via INTRAVENOUS

## 2017-05-19 MED ORDER — LIDOCAINE HCL 2 % IJ SOLN
INTRAMUSCULAR | Status: AC
  Filled 2017-05-19: qty 20

## 2017-05-19 MED ORDER — METHYLPREDNISOLONE ACETATE 80 MG/ML IJ SUSP
80.0000 mg | Freq: Once | INTRAMUSCULAR | Status: AC
  Administered 2017-05-19: 80 mg via INTRA_ARTICULAR

## 2017-05-19 MED ORDER — TRIAMCINOLONE ACETONIDE 40 MG/ML IJ SUSP
INTRAMUSCULAR | Status: AC
  Filled 2017-05-19: qty 2

## 2017-05-19 MED ORDER — FENTANYL CITRATE (PF) 100 MCG/2ML IJ SOLN
INTRAMUSCULAR | Status: AC
  Filled 2017-05-19: qty 2

## 2017-05-19 MED ORDER — MIDAZOLAM HCL 5 MG/5ML IJ SOLN
INTRAMUSCULAR | Status: AC
  Filled 2017-05-19: qty 5

## 2017-05-19 NOTE — Patient Instructions (Signed)

## 2017-05-20 ENCOUNTER — Telehealth: Payer: Self-pay

## 2017-05-20 NOTE — Telephone Encounter (Signed)
Post procedure phone call.  Left message.  

## 2017-05-31 ENCOUNTER — Encounter: Payer: BLUE CROSS/BLUE SHIELD | Admitting: Nurse Practitioner

## 2017-06-06 ENCOUNTER — Ambulatory Visit: Payer: BLUE CROSS/BLUE SHIELD | Admitting: Pain Medicine

## 2017-06-08 ENCOUNTER — Encounter: Payer: Self-pay | Admitting: Nurse Practitioner

## 2017-06-08 ENCOUNTER — Other Ambulatory Visit: Payer: Self-pay

## 2017-06-08 ENCOUNTER — Other Ambulatory Visit
Admission: RE | Admit: 2017-06-08 | Discharge: 2017-06-08 | Disposition: A | Discharge status: Home / Self Care | Payer: BLUE CROSS/BLUE SHIELD | Source: Ambulatory Visit | Attending: Nurse Practitioner | Admitting: Nurse Practitioner

## 2017-06-08 ENCOUNTER — Ambulatory Visit: Payer: BLUE CROSS/BLUE SHIELD | Attending: Pain Medicine | Admitting: Nurse Practitioner

## 2017-06-08 VITALS — BP 144/83 | HR 79 | Temp 98.1°F | Resp 18 | Ht 71.0 in | Wt 270.0 lb

## 2017-06-08 DIAGNOSIS — E559 Vitamin D deficiency, unspecified: Secondary | ICD-10-CM | POA: Diagnosis not present

## 2017-06-08 DIAGNOSIS — G894 Chronic pain syndrome: Secondary | ICD-10-CM | POA: Diagnosis not present

## 2017-06-08 DIAGNOSIS — M5136 Other intervertebral disc degeneration, lumbar region: Secondary | ICD-10-CM | POA: Insufficient documentation

## 2017-06-08 DIAGNOSIS — M5442 Lumbago with sciatica, left side: Secondary | ICD-10-CM | POA: Diagnosis not present

## 2017-06-08 DIAGNOSIS — Z823 Family history of stroke: Secondary | ICD-10-CM | POA: Diagnosis not present

## 2017-06-08 DIAGNOSIS — M47816 Spondylosis without myelopathy or radiculopathy, lumbar region: Secondary | ICD-10-CM

## 2017-06-08 DIAGNOSIS — Z79899 Other long term (current) drug therapy: Secondary | ICD-10-CM | POA: Diagnosis not present

## 2017-06-08 DIAGNOSIS — M48061 Spinal stenosis, lumbar region without neurogenic claudication: Secondary | ICD-10-CM | POA: Insufficient documentation

## 2017-06-08 DIAGNOSIS — F17210 Nicotine dependence, cigarettes, uncomplicated: Secondary | ICD-10-CM | POA: Insufficient documentation

## 2017-06-08 DIAGNOSIS — Z79891 Long term (current) use of opiate analgesic: Secondary | ICD-10-CM | POA: Diagnosis not present

## 2017-06-08 DIAGNOSIS — G8929 Other chronic pain: Secondary | ICD-10-CM | POA: Diagnosis not present

## 2017-06-08 DIAGNOSIS — M79604 Pain in right leg: Secondary | ICD-10-CM | POA: Diagnosis not present

## 2017-06-08 DIAGNOSIS — M5441 Lumbago with sciatica, right side: Secondary | ICD-10-CM

## 2017-06-08 DIAGNOSIS — Z5181 Encounter for therapeutic drug level monitoring: Secondary | ICD-10-CM | POA: Diagnosis present

## 2017-06-08 DIAGNOSIS — M545 Low back pain: Secondary | ICD-10-CM | POA: Diagnosis present

## 2017-06-08 DIAGNOSIS — R37 Sexual dysfunction, unspecified: Secondary | ICD-10-CM | POA: Insufficient documentation

## 2017-06-08 DIAGNOSIS — M79605 Pain in left leg: Secondary | ICD-10-CM | POA: Diagnosis not present

## 2017-06-08 DIAGNOSIS — Z833 Family history of diabetes mellitus: Secondary | ICD-10-CM | POA: Diagnosis not present

## 2017-06-08 MED ORDER — GABAPENTIN 300 MG PO CAPS: 300.0000 mg | ORAL_CAPSULE | Freq: Three times a day (TID) | ORAL | 0 refills | Status: DC

## 2017-06-08 MED ORDER — OXYCODONE HCL 10 MG PO TABS: 10.0000 mg | ORAL_TABLET | Freq: Four times a day (QID) | ORAL | 0 refills | Status: DC | PRN

## 2017-06-08 MED ORDER — SILDENAFIL CITRATE 100 MG PO TABS: 100.0000 mg | ORAL_TABLET | ORAL | 1 refills | Status: DC | PRN

## 2017-06-08 NOTE — Progress Notes (Signed)
Patient's Name: Nathan Lambert.  MRN: [id number]  Referring Provider: Vidal Schwalbe, MD  DOB: 07-19-1981  PCP: Vidal Schwalbe, MD  DOS: 06/08/2017  Note by: Vevelyn Francois NP  Service setting: Ambulatory outpatient  Specialty: Interventional Pain Management  Location: [facility] (AMB) Pain Management Facility    Patient type: Established    Primary Reason(s) for Visit: Encounter for prescription drug management & post-procedure evaluation of chronic illness with mild to moderate exacerbation(Level of risk: moderate) CC: Back Pain (lower)  HPI  [name] is a 36 y.o. year old, male patient, who comes today for a post-procedure evaluation and medication management. 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 His primarily concern today is the Back Pain (lower)  Pain Assessment: Location: Lower Back Radiating: left side Onset: More than a month ago Duration: Chronic pain Quality: Aching, Grimacing, Discomfort Severity: 2 /10 (self-reported pain score)  Note: Reported level is compatible with observation.                          Effect on ADL: bending, standing a long time Timing: Constant Modifying factors: medication, rest  [name] was last seen on 05/05/2017 for a procedure.  During today's appointment we reviewed [name] post-procedure results, as well as his outpatient medication regimen. He admits that he did get benefit with the Lumbar Facet injection. He states that it is probably that he feels better and then does too much.. He continues to have the constipation. He has fear of using the [place]. He also admits that he is having some sexual problems.   Further details on both, my assessment(s), as well as the proposed treatment plan, please see below.  Controlled Substance Pharmacotherapy Assessment REMS (Risk Evaluation and Mitigation Strategy)  Analgesic:oxycodone 10 mg 4 times daily MME/day:34m/day.  GIgnatius Specking RN  06/08/2017  9:25 AM  Sign at close encounter Nursing Pain Medication Assessment:  Safety precautions to be maintained throughout the outpatient stay will include: orient to surroundings, keep bed in low position, maintain call bell within reach at all times, provide assistance with transfer out of bed and ambulation.  Medication Inspection Compliance: Pill count conducted under aseptic conditions, in front of the patient. Neither the pills nor the bottle was removed from the patient's sight at any time. Once count was completed pills were immediately returned to the patient in their original bottle.  Medication: See above Pill/Patch Count: 40 of 120 pills remain Pill/Patch Appearance: Markings consistent with prescribed medication Bottle Appearance: Standard pharmacy container. Clearly labeled. Filled Date: 125/ 30 / 2018 Last Medication intake:  Today   Pharmacokinetics: Liberation and absorption (onset of action): WNL Distribution (time to peak effect): WNL Metabolism and excretion (duration of action): WNL         Pharmacodynamics: Desired effects: Analgesia: Mr. CZayedreports >50% benefit. Functional ability: Patient reports that medication allows him to accomplish basic ADLs Clinically meaningful improvement  in function (CMIF): Sustained CMIF goals met Perceived effectiveness: Described as relatively effective, allowing for increase in activities of daily living (ADL) Undesirable effects: Side-effects or Adverse reactions: None reported Monitoring: [place] PMP: Online review of the past 49-monthperiod conducted. Compliant with practice rules and regulations Last UDS on record: Summary  Date Value Ref Range Status  12/27/2016 FINAL  Final    Comment:    ==================================================================== TOX[facility]RE COMP DRUG ANALYSIS,UR ==================================================================== Test                             Result       Flag       Units Drug Present and Declared for Prescription Verification   Oxycodone                      300          EXPECTED   ng/mg creat   Oxymorphone                    493          EXPECTED   ng/mg creat   Noroxycodone                   723          EXPECTED   ng/mg creat   Noroxymorphone                 129          EXPECTED   ng/mg creat    Sources of oxycodone are scheduled prescription medications.    Oxymorphone, noroxycodone, and noroxymorphone are expected    metabolites of oxycodone. Oxymorphone is also available as a    scheduled prescription medication.   Acetaminophen                  PRESENT      EXPECTED Drug Absent but Declared for Prescription Verification   Gabapentin                     Not Detected UNEXPECTED ==================================================================== Test                      Result    Flag   Units      Ref Range   Creatinine              257              mg/dL      >=20 ==================================================================== Declared Medications:  The flagging and interpretation on this report are based on the  following declared medications.  Unexpected results may arise from  inaccuracies in the declared medications.  **Note: The testing scope of this panel  includes these medications:  Gabapentin  Oxycodone (Percocet)  **Note: The testing scope of this panel does not include small to  moderate amounts of these reported medications:  Acetaminophen (Percocet)  **Note: The testing scope of this panel does not include following  reported medications:  Loratadine (Claritin) ==================================================================== For clinical consultation, please call ([phone] ====================================================================    UDS interpretation: Compliant          Medication Assessment Form: Reviewed. Patient indicates being compliant with therapy Treatment compliance: Compliant Risk Assessment Profile: Aberrant behavior: See prior evaluations. None observed or detected today Comorbid factors increasing risk of overdose: See prior notes. No additional risks detected today Risk of  substance use disorder (SUD): Low Opioid Risk Tool - 06/08/17 0914      Family History of Substance Abuse   Alcohol  Negative    Illegal Drugs  Negative    Rx Drugs  Negative      Personal History of Substance Abuse   Alcohol  Positive Male or Male    Illegal Drugs  Negative    Rx Drugs  Negative      Age   Age between 14-45 years   No      History of Preadolescent Sexual Abuse   History of Preadolescent Sexual Abuse  Negative or Male      Psychological Disease   Psychological Disease  Negative    Depression  Negative      Total Score   Opioid Risk Tool Scoring  3    Opioid Risk Interpretation  Low Risk      ORT Scoring interpretation table:  Score <3 = Low Risk for SUD  Score between 4-7 = Moderate Risk for SUD  Score >8 = High Risk for Opioid Abuse   Risk Mitigation Strategies:  Patient Counseling: Covered Patient-Prescriber Agreement (PPA): Present and active  Notification to other healthcare providers: Done  Pharmacologic Plan: No change in therapy, at this time  Post-Procedure Assessment   05/19/2017 Procedure: Bilateral facet and sacroiliac joint injection Pre-procedure pain score:  3/10 Post-procedure pain score: 0/10         Influential Factors: BMI: 37.66 kg/m Intra-procedural challenges: None observed.         Assessment challenges: None detected.              Reported side-effects: None.        Post-procedural adverse reactions or complications: None reported         Sedation: Please see nurses note. When no sedatives are used, the analgesic levels obtained are directly associated to the effectiveness of the local anesthetics. However, when sedation is provided, the level of analgesia obtained during the initial 1 hour following the intervention, is believed to be the result of a combination of factors. These factors may include, but are not limited to: 1. The effectiveness of the local anesthetics used. 2. The effects of the analgesic(s) and/or anxiolytic(s) used. 3. The degree of discomfort experienced by the patient at the time of the procedure. 4. The patients ability and reliability in recalling and recording the events. 5. The presence and influence of possible secondary gains and/or psychosocial factors. Reported result: Relief experienced during the 1st hour after the procedure: 100 % (Ultra-Short Term Relief)            Interpretative annotation: Clinically appropriate result. Analgesia during this period is likely to be Local Anesthetic and/or IV Sedative (Analgesic/Anxiolytic) related.          Effects of local anesthetic: The analgesic effects attained during this period are directly associated to the localized infiltration of local anesthetics and therefore cary significant diagnostic value as to the etiological location, or anatomical origin, of the pain. Expected duration of relief is directly dependent on the pharmacodynamics of the local anesthetic used. Long-acting (4-6 hours) anesthetics used.  Reported result: Relief during the next 4 to 6 hour after  the procedure: 100 % (Short-Term Relief)            Interpretative annotation: Clinically appropriate result. Analgesia during this period is likely to be Local Anesthetic-related.          Long-term benefit: Defined as the period of  time past the expected duration of local anesthetics (1 hour for short-acting and 4-6 hours for long-acting). With the possible exception of prolonged sympathetic blockade from the local anesthetics, benefits during this period are typically attributed to, or associated with, other factors such as analgesic sensory neuropraxia, antiinflammatory effects, or beneficial biochemical changes provided by agents other than the local anesthetics.  Reported result: Extended relief following procedure: 35 % (Long-Term Relief)            Interpretative annotation: Clinically appropriate result. Good relief. No permanent benefit expected. Inflammation plays a part in the etiology to the pain.          Current benefits: Defined as reported results that persistent at this point in time.   Analgesia: >50 % Mr. Blank reports improvement of axial symptoms. Function: Somewhat improved ROM: Somewhat improved Interpretative annotation: Recurrence of symptoms. No permanent benefit expected. Effective diagnostic intervention.          Interpretation: Results would suggest a successful diagnostic intervention.                  Plan:  Please see "Plan of Care" for details.        Laboratory Chemistry  Inflammation Markers (CRP: Acute Phase) (ESR: Chronic Phase) Lab Results  Component Value Date   CRP 8.5 (H) 12/27/2016   ESRSEDRATE 35 (H) 12/27/2016                 Renal Function Markers Lab Results  Component Value Date   BUN 6 12/27/2016   CREATININE 0.83 12/27/2016   GFRAA 133 12/27/2016   GFRNONAA 115 12/27/2016                 Hepatic Function Markers Lab Results  Component Value Date   AST 11 12/27/2016   ALT 15 12/27/2016   ALBUMIN 4.2 12/27/2016   ALKPHOS  56 12/27/2016                 Electrolytes Lab Results  Component Value Date   NA 144 12/27/2016   K 4.3 12/27/2016   CL 106 12/27/2016   CALCIUM 9.0 12/27/2016   MG 2.1 12/27/2016                 Neuropathy Markers Lab Results  Component Value Date   VITAMINB12 462 12/27/2016                 Bone Pathology Markers Lab Results  Component Value Date   ALKPHOS 56 12/27/2016   25OHVITD1 17 (L) 12/27/2016   25OHVITD2 <1.0 12/27/2016   25OHVITD3 17 12/27/2016   CALCIUM 9.0 12/27/2016                 Rheumatology Markers No results found for: LABURIC, URICUR              Coagulation Parameters No results found for: INR, LABPROT, APTT, PLT, DDIMER               Cardiovascular Markers No results found for: BNP, CKTOTAL, CKMB, TROPONINI, HGB, HCT               CA Markers No results found for: CEA, CA125, LABCA2               Note: Lab results reviewed.  Recent Diagnostic Imaging Results  DG C-Arm 1-60 Min-No Report Fluoroscopy was utilized by the requesting physician.  No radiographic  interpretation.   Complexity Note: Imaging results reviewed. Results  shared with Mr. Torrence, using Layman's terms.                         Meds   Current Outpatient Medications:  .  loratadine (CLARITIN) 10 MG tablet, Take 10 mg by mouth daily as needed for allergies., Disp: , Rfl:  .  montelukast (SINGULAIR) 10 MG tablet, Take 10 mg by mouth at bedtime., Disp: , Rfl:  .  naloxone (NARCAN) 2 MG/2ML injection, Inject content of syringe into thigh muscle. Call 911., Disp: 2 Syringe, Rfl: 1 .  [START ON 06/16/2017] Oxycodone HCl 10 MG TABS, Take 1 tablet (10 mg total) by mouth every 6 (six) hours as needed., Disp: 120 tablet, Rfl: 0 .  [START ON 06/16/2017] gabapentin (NEURONTIN) 300 MG capsule, Take 1-3 capsules (300-900 mg total) by mouth 3 (three) times daily. Follow written titration schedule., Disp: 270 capsule, Rfl: 0 .  sildenafil (VIAGRA) 100 MG tablet, Take 1 tablet (100 mg  total) by mouth as needed for erectile dysfunction., Disp: 20 tablet, Rfl: 1  ROS  Constitutional: Denies any fever or chills Gastrointestinal: No reported hemesis, hematochezia, vomiting, or acute GI distress Musculoskeletal: Denies any acute onset joint swelling, redness, loss of ROM, or weakness Neurological: No reported episodes of acute onset apraxia, aphasia, dysarthria, agnosia, amnesia, paralysis, loss of coordination, or loss of consciousness  Allergies  Mr. Glasscock is allergic to bactrim [sulfamethoxazole-trimethoprim].  PFSH  Drug: Mr. Neyhart  reports that he does not use drugs. Alcohol:  reports that he does not drink alcohol. Tobacco:  reports that he has been smoking cigarettes.  He has a 10.00 pack-year smoking history. he has never used smokeless tobacco. Medical:  has a past medical history of Allergy (2012), Chronic low back pain (Primary Area of Pain) (Bilateral) (R>L), Degeneration of lumbar or lumbosacral intervertebral disc (L4-L5) (12/27/2016), Headache, and Opiate use (02/15/2017). Surgical: Mr. Wilsey  has a past surgical history that includes Fracture surgery (Left); Spine surgery (Right, 06/16/2015); and Back surgery. Family: family history includes AAA (abdominal aortic aneurysm) in his mother; Diabetes in his father; Stroke in his mother.  Constitutional Exam  General appearance: Well nourished, well developed, and well hydrated. In no apparent acute distress Vitals:   06/08/17 0902  BP: (!) 144/83  Pulse: 79  Resp: 18  Temp: 98.1 F (36.7 C)  SpO2: 99%  Weight: 270 lb (122.5 kg)  Height: _0  (1.803 m)  Psych/Mental status: Alert, oriented x 3 (person, place, & time)       Eyes: PERLA Respiratory: No evidence of acute respiratory distress  Lumbar Spine Area Exam  Skin & Axial Inspection: No masses, redness, or swelling Alignment: Symmetrical Functional ROM: Unrestricted ROM      Stability: No instability detected Muscle  Tone/Strength: Functionally intact. No obvious neuro-muscular anomalies detected. Sensory (Neurological): Unimpaired Palpation: Complains of area being tender to palpation       Provocative Tests: Lumbar Hyperextension and rotation test: Positive bilaterally for facet joint pain. Lumbar Lateral bending test: evaluation deferred today       Patrick's Maneuver: evaluation deferred today                    Gait & Posture Assessment  Ambulation: Unassisted Gait: Relatively normal for age and body habitus Posture: WNL   Lower Extremity Exam    Side: Right lower extremity  Side: Left lower extremity  Skin & Extremity Inspection: Skin color, temperature, and hair growth are WNL.  No peripheral edema or cyanosis. No masses, redness, swelling, asymmetry, or associated skin lesions. No contractures.  Skin & Extremity Inspection: Skin color, temperature, and hair growth are WNL. No peripheral edema or cyanosis. No masses, redness, swelling, asymmetry, or associated skin lesions. No contractures.  Functional ROM: Unrestricted ROM          Functional ROM: Unrestricted ROM          Muscle Tone/Strength: Functionally intact. No obvious neuro-muscular anomalies detected.  Muscle Tone/Strength: Functionally intact. No obvious neuro-muscular anomalies detected.  Sensory (Neurological): Unimpaired  Sensory (Neurological): Unimpaired  Palpation: No palpable anomalies  Palpation: No palpable anomalies   Assessment  Primary Diagnosis & Pertinent Problem List: The primary encounter diagnosis was Chronic low back pain (Primary Area of Pain) (Bilateral) (R>L). Diagnoses of Chronic lower extremity pain (Bilateral) (L>R), Lumbar facet syndrome (Bilateral) (R>L), Chronic pain syndrome, Long term (current) use of opiate analgesic, and Sexual dysfunction were also pertinent to this visit.  Status Diagnosis  Controlled Controlled Controlled 1. Chronic low back pain (Primary Area of Pain) (Bilateral) (R>L)   2. Chronic  lower extremity pain (Bilateral) (L>R)   3. Lumbar facet syndrome (Bilateral) (R>L)   4. Chronic pain syndrome   5. Long term (current) use of opiate analgesic   6. Sexual dysfunction     Problems updated and reviewed during this visit: Problem  Sexual Dysfunction   Plan of Care  Pharmacotherapy (Medications Ordered): Meds ordered this encounter  Medications  . gabapentin (NEURONTIN) 300 MG capsule    Sig: Take 1-3 capsules (300-900 mg total) by mouth 3 (three) times daily. Follow written titration schedule.    Dispense:  270 capsule    Refill:  0    Do not place medication on "Automatic Refill". Fill one day early if pharmacy is closed on scheduled refill date.    Order Specific Question:   Supervising Provider    Answer:   Milinda Pointer [phone]  . Oxycodone HCl 10 MG TABS    Sig: Take 1 tablet (10 mg total) by mouth every 6 (six) hours as needed.    Dispense:  120 tablet    Refill:  0    Do not place this medication, or any other prescription from our practice, on "Automatic Refill". Patient may have prescription filled one day early if pharmacy is closed on scheduled refill date. Do not fill until: 06/16/2017 To last until: 07/16/2017    Order Specific Question:   Supervising Provider    Answer:   Milinda Pointer [phone]  . sildenafil (VIAGRA) 100 MG tablet    Sig: Take 1 tablet (100 mg total) by mouth as needed for erectile dysfunction.    Dispense:  20 tablet    Refill:  1    Order Specific Question:   Supervising Provider    Answer:   Milinda Pointer [phone]  This SmartLink is deprecated. Use AVSMEDLIST instead to display the medication list for a patient. Medications administered today: Bradlee L. Kellenberger Jr. had no medications administered during this visit. Lab-work, procedure(s), and/or referral(s): Orders Placed This Encounter  Procedures  . ToxASSURE Select 13 (MW), Urine  . Testosterone, Free, Total, SHBG   Imaging and/or  referral(s): None  Interventional therapies: Planned, scheduled, and/or pending:   Not at this time    Considering:  Diagnostic bilateral lumbar facet block Possible bilateral lumbar facet RFA Diagnostic bilateral sacroiliac joint block Possible bilateral sacroiliac joint RFA Diagnostic caudal epidural steroid injection + diagnostic epidurogram Possible Racz procedure Diagnostic  left intra-articular knee injection with local anesthetic and steroid Possible left series of 5 intra-articular Hyalgan knee injections Diagnostic left Genicular nerve block Possible left Genicular nerve RFA   Palliative PRN treatment(s):  None at this time   Provider-requested follow-up: Return in about 4 weeks (around 07/06/2017) for MedMgmt.  Future Appointments  Date Time Provider [place]  07/05/2017  9:15 AM Vevelyn Francois, NP [facility] None   Primary Care Physician: Vidal Schwalbe, MD Location: [facility] Note by: Vevelyn Francois NP Date: 06/08/2017; Time: 3:15 PM  Pain Score Disclaimer: We use the NRS-11 scale. This is a self-reported, subjective measurement of pain severity with only modest accuracy. It is used primarily to identify changes within a particular patient. It must be understood that outpatient pain scales are significantly less accurate that those used for research, where they can be applied under ideal controlled circumstances with minimal exposure to variables. In reality, the score is likely to be a combination of pain intensity and pain affect, where pain affect describes the degree of emotional arousal or changes in action readiness caused by the sensory experience of pain. Factors such as social and work situation, setting, emotional state, anxiety levels, expectation, and prior pain experience may influence pain perception and show large inter-individual differences that may also be affected by time variables.  Patient  instructions provided during this appointment: Patient Instructions    ____________________________________________________________________________________________  Medication Rules  Applies to: All patients receiving prescriptions (written or electronic).  Pharmacy of record: Pharmacy where electronic prescriptions will be sent. If written prescriptions are taken to a different pharmacy, please inform the nursing staff. The pharmacy listed in the electronic medical record should be the one where you would like electronic prescriptions to be sent.  Prescription refills: Only during scheduled appointments. Applies to both, written and electronic prescriptions.  NOTE: The following applies primarily to controlled substances (Opioid* Pain Medications).   Patient's responsibilities: 1. Pain Pills: Bring all pain pills to every appointment (except for procedure appointments). 2. Pill Bottles: Bring pills in original pharmacy bottle. Always bring newest bottle. Bring bottle, even if empty. 3. Medication refills: You are responsible for knowing and keeping track of what medications you need refilled. The day before your appointment, write a list of all prescriptions that need to be refilled. Bring that list to your appointment and give it to the admitting nurse. Prescriptions will be written only during appointments. If you forget a medication, it will not be "Called in", "Faxed", or "electronically sent". You will need to get another appointment to get these prescribed. 4. Prescription Accuracy: You are responsible for carefully inspecting your prescriptions before leaving our office. Have the discharge nurse carefully go over each prescription with you, before taking them home. Make sure that your name is accurately spelled, that your address is correct. Check the name and dose of your medication to make sure it is accurate. Check the number of pills, and the written instructions to make sure they are  clear and accurate. Make sure that you are given enough medication to last until your next medication refill appointment. 5. Taking Medication: Take medication as prescribed. Never take more pills than instructed. Never take medication more frequently than prescribed. Taking less pills or less frequently is permitted and encouraged, when it comes to controlled substances (written prescriptions).  6. Inform other Doctors: Always inform, all of your healthcare providers, of all the medications you take. 7. Pain Medication from other Providers: You are  not allowed to accept any additional pain medication from any other Doctor or Healthcare provider. There are two exceptions to this rule. (see below) In the event that you require additional pain medication, you are responsible for notifying us, as stated below. 8. Medication Agreement: You are responsible for carefully reading and following our Medication Agreement. This must be signed before receiving any prescriptions from our practice. Safely store a copy of your signed Agreement. Violations to the Agreement will result in no further prescriptions. (Additional copies of our Medication Agreement are available upon request.) 9. Laws, Rules, & Regulations: All patients are expected to follow all Federal and [employer], [employer], Rules, [employer]. Ignorance of the Laws does not constitute a valid excuse. The use of any illegal substances is prohibited. 10. Adopted CDC guidelines & recommendations: Target dosing levels will be at or below 60 MME/day. Use of benzodiazepines** is not recommended.  Exceptions: There are only two exceptions to the rule of not receiving pain medications from other Healthcare Providers. 1. Exception #1 (Emergencies): In the event of an emergency (i.e.: accident requiring emergency care), you are allowed to receive additional pain medication. However, you are responsible for: As soon as you are able, call our office [phone],  at any time of the day or night, and leave a message stating your name, the date and nature of the emergency, and the name and dose of the medication prescribed. In the event that your call is answered by a member of our staff, make sure to document and save the date, time, and the name of the person that took your information.  2. Exception #2 (Planned Surgery): In the event that you are scheduled by another doctor or dentist to have any type of surgery or procedure, you are allowed (for a period no longer than 30 days), to receive additional pain medication, for the acute post-op pain. However, in this case, you are responsible for picking up a copy of our "Post-op Pain Management for Surgeons" handout, and giving it to your surgeon or dentist. This document is available at our office, and does not require an appointment to obtain it. Simply go to our office during business hours (Monday-Thursday from 8:00 AM to 4:00 PM) (Friday 8:00 AM to 12:00 Noon) or if you have a scheduled appointment with [place], prior to your surgery, and ask for it by name. In addition, you will need to provide [place] with your name, name of your surgeon, type of surgery, and date of procedure or surgery.  *Opioid medications include: morphine, codeine, oxycodone, oxymorphone, hydrocodone, hydromorphone, meperidine, tramadol, tapentadol, buprenorphine, fentanyl, methadone. **Benzodiazepine medications include: diazepam (Valium), alprazolam (Xanax), clonazepam (Klonopine), lorazepam (Ativan), clorazepate (Tranxene), chlordiazepoxide (Librium), estazolam (Prosom), oxazepam (Serax), temazepam (Restoril), triazolam (Halcion)  ____________________________________________________________________________________________   BMI Assessment: Estimated body mass index is 37.66 kg/m as calculated from the following:   Height as of this encounter: _0  (1.803 m).   Weight as of this encounter: 270 lb (122.5 kg).  BMI interpretation table: BMI  level Category Range association with higher incidence of chronic pain  <18 kg/m2 Underweight   18.5-24.9 kg/m2 Ideal body weight   25-29.9 kg/m2 Overweight Increased incidence by 20%  30-34.9 kg/m2 Obese (Class I) Increased incidence by 68%  35-39.9 kg/m2 Severe obesity (Class II) Increased incidence by 136%  >40 kg/m2 Extreme obesity (Class III) Increased incidence by 254%   BMI Readings from Last 4 Encounters:  06/08/17 37.66 kg/m  05/19/17 38.22 kg/m  05/05/17 37.66 kg/m  04/06/17 38.63 kg/m   Wt Readings from Last 4 Encounters:  06/08/17 270 lb (122.5 kg)  05/19/17 274 lb (124.3 kg)  05/05/17 270 lb (122.5 kg)  04/06/17 277 lb (125.6 kg)

## 2017-06-08 NOTE — Progress Notes (Signed)
Nursing Pain Medication Assessment:  Safety precautions to be maintained throughout the outpatient stay will include: orient to surroundings, keep bed in low position, maintain call bell within reach at all times, provide assistance with transfer out of bed and ambulation.  Medication Inspection Compliance: Pill count conducted under aseptic conditions, in front of the patient. Neither the pills nor the bottle was removed from the patient's sight at any time. Once count was completed pills were immediately returned to the patient in their original bottle.  Medication: See above Pill/Patch Count: 40 of 120 pills remain Pill/Patch Appearance: Markings consistent with prescribed medication Bottle Appearance: Standard pharmacy container. Clearly labeled. Filled Date: 2410 / 30 / 2018 Last Medication intake:  Today

## 2017-06-08 NOTE — Patient Instructions (Addendum)
____________________________________________________________________________________________  Medication Rules  Applies to: All patients receiving prescriptions (written or electronic).  Pharmacy of record: Pharmacy where electronic prescriptions will be sent. If written prescriptions are taken to a different pharmacy, please inform the nursing staff. The pharmacy listed in the electronic medical record should be the one where you would like electronic prescriptions to be sent.  Prescription refills: Only during scheduled appointments. Applies to both, written and electronic prescriptions.  NOTE: The following applies primarily to controlled substances (Opioid* Pain Medications).   Patient's responsibilities: 1. Pain Pills: Bring all pain pills to every appointment (except for procedure appointments). 2. Pill Bottles: Bring pills in original pharmacy bottle. Always bring newest bottle. Bring bottle, even if empty. 3. Medication refills: You are responsible for knowing and keeping track of what medications you need refilled. The day before your appointment, write a list of all prescriptions that need to be refilled. Bring that list to your appointment and give it to the admitting nurse. Prescriptions will be written only during appointments. If you forget a medication, it will not be "Called in", "Faxed", or "electronically sent". You will need to get another appointment to get these prescribed. 4. Prescription Accuracy: You are responsible for carefully inspecting your prescriptions before leaving our office. Have the discharge nurse carefully go over each prescription with you, before taking them home. Make sure that your name is accurately spelled, that your address is correct. Check the name and dose of your medication to make sure it is accurate. Check the number of pills, and the written instructions to make sure they are clear and accurate. Make sure that you are given enough medication to  last until your next medication refill appointment. 5. Taking Medication: Take medication as prescribed. Never take more pills than instructed. Never take medication more frequently than prescribed. Taking less pills or less frequently is permitted and encouraged, when it comes to controlled substances (written prescriptions).  6. Inform other Doctors: Always inform, all of your healthcare providers, of all the medications you take. 7. Pain Medication from other Providers: You are not allowed to accept any additional pain medication from any other Doctor or Healthcare provider. There are two exceptions to this rule. (see below) In the event that you require additional pain medication, you are responsible for notifying us, as stated below. 8. Medication Agreement: You are responsible for carefully reading and following our Medication Agreement. This must be signed before receiving any prescriptions from our practice. Safely store a copy of your signed Agreement. Violations to the Agreement will result in no further prescriptions. (Additional copies of our Medication Agreement are available upon request.) 9. Laws, Rules, & Regulations: All patients are expected to follow all Federal and State Laws, Statutes, Rules, & Regulations. Ignorance of the Laws does not constitute a valid excuse. The use of any illegal substances is prohibited. 10. Adopted CDC guidelines & recommendations: Target dosing levels will be at or below 60 MME/day. Use of benzodiazepines** is not recommended.  Exceptions: There are only two exceptions to the rule of not receiving pain medications from other Healthcare Providers. 1. Exception #1 (Emergencies): In the event of an emergency (i.e.: accident requiring emergency care), you are allowed to receive additional pain medication. However, you are responsible for: As soon as you are able, call our office [phone], at any time of the day or night, and leave a message stating your  name, the date and nature of the emergency, and the name and dose of the medication   prescribed. In the event that your call is answered by a member of our staff, make sure to document and save the date, time, and the name of the person that took your information.  2. Exception #2 (Planned Surgery): In the event that you are scheduled by another doctor or dentist to have any type of surgery or procedure, you are allowed (for a period no longer than 30 days), to receive additional pain medication, for the acute post-op pain. However, in this case, you are responsible for picking up a copy of our "Post-op Pain Management for Surgeons" handout, and giving it to your surgeon or dentist. This document is available at our office, and does not require an appointment to obtain it. Simply go to our office during business hours (Monday-Thursday from 8:00 AM to 4:00 PM) (Friday 8:00 AM to 12:00 Noon) or if you have a scheduled appointment with us, prior to your surgery, and ask for it by name. In addition, you will need to provide us with your name, name of your surgeon, type of surgery, and date of procedure or surgery.  *Opioid medications include: morphine, codeine, oxycodone, oxymorphone, hydrocodone, hydromorphone, meperidine, tramadol, tapentadol, buprenorphine, fentanyl, methadone. **Benzodiazepine medications include: diazepam (Valium), alprazolam (Xanax), clonazepam (Klonopine), lorazepam (Ativan), clorazepate (Tranxene), chlordiazepoxide (Librium), estazolam (Prosom), oxazepam (Serax), temazepam (Restoril), triazolam (Halcion)  ____________________________________________________________________________________________   BMI Assessment: Estimated body mass index is 37.66 kg/m as calculated from the following:   Height as of this encounter: 5\' 11"  (1.803 m).   Weight as of this encounter: 270 lb (122.5 kg).  BMI interpretation table: BMI level Category Range association with higher incidence of chronic  pain  <18 kg/m2 Underweight   18.5-24.9 kg/m2 Ideal body weight   25-29.9 kg/m2 Overweight Increased incidence by 20%  30-34.9 kg/m2 Obese (Class I) Increased incidence by 68%  35-39.9 kg/m2 Severe obesity (Class II) Increased incidence by 136%  >40 kg/m2 Extreme obesity (Class III) Increased incidence by 254%   BMI Readings from Last 4 Encounters:  06/08/17 37.66 kg/m  05/19/17 38.22 kg/m  05/05/17 37.66 kg/m  04/06/17 38.63 kg/m   Wt Readings from Last 4 Encounters:  06/08/17 270 lb (122.5 kg)  05/19/17 274 lb (124.3 kg)  05/05/17 270 lb (122.5 kg)  04/06/17 277 lb (125.6 kg)

## 2017-06-10 LAB — TESTOSTERONE,FREE AND TOTAL
Testosterone, Free: 2.6 pg/mL — ABNORMAL LOW (ref 8.7–25.1)
Testosterone: 116 ng/dL — ABNORMAL LOW (ref 264–916)

## 2017-06-14 ENCOUNTER — Other Ambulatory Visit: Payer: Self-pay | Admitting: Nurse Practitioner

## 2017-06-14 DIAGNOSIS — K5900 Constipation, unspecified: Secondary | ICD-10-CM

## 2017-06-14 DIAGNOSIS — R37 Sexual dysfunction, unspecified: Secondary | ICD-10-CM

## 2017-06-15 ENCOUNTER — Ambulatory Visit: Payer: BLUE CROSS/BLUE SHIELD | Admitting: Nurse Practitioner

## 2017-06-16 LAB — TOXASSURE SELECT 13 (MW), URINE

## 2017-07-05 ENCOUNTER — Other Ambulatory Visit: Payer: Self-pay

## 2017-07-05 ENCOUNTER — Ambulatory Visit: Payer: BLUE CROSS/BLUE SHIELD | Attending: Nurse Practitioner | Admitting: Nurse Practitioner

## 2017-07-05 ENCOUNTER — Encounter: Payer: Self-pay | Admitting: Nurse Practitioner

## 2017-07-05 VITALS — BP 121/72 | HR 61 | Temp 98.2°F | Resp 16 | Ht 71.0 in | Wt 270.0 lb

## 2017-07-05 DIAGNOSIS — M4807 Spinal stenosis, lumbosacral region: Secondary | ICD-10-CM | POA: Diagnosis not present

## 2017-07-05 DIAGNOSIS — Z79899 Other long term (current) drug therapy: Secondary | ICD-10-CM | POA: Insufficient documentation

## 2017-07-05 DIAGNOSIS — F17200 Nicotine dependence, unspecified, uncomplicated: Secondary | ICD-10-CM | POA: Diagnosis not present

## 2017-07-05 DIAGNOSIS — Z8249 Family history of ischemic heart disease and other diseases of the circulatory system: Secondary | ICD-10-CM | POA: Insufficient documentation

## 2017-07-05 DIAGNOSIS — M5442 Lumbago with sciatica, left side: Secondary | ICD-10-CM

## 2017-07-05 DIAGNOSIS — G8929 Other chronic pain: Secondary | ICD-10-CM

## 2017-07-05 DIAGNOSIS — Z881 Allergy status to other antibiotic agents status: Secondary | ICD-10-CM | POA: Diagnosis not present

## 2017-07-05 DIAGNOSIS — R51 Headache: Secondary | ICD-10-CM | POA: Insufficient documentation

## 2017-07-05 DIAGNOSIS — Z833 Family history of diabetes mellitus: Secondary | ICD-10-CM | POA: Diagnosis not present

## 2017-07-05 DIAGNOSIS — G894 Chronic pain syndrome: Secondary | ICD-10-CM

## 2017-07-05 DIAGNOSIS — M48061 Spinal stenosis, lumbar region without neurogenic claudication: Secondary | ICD-10-CM | POA: Insufficient documentation

## 2017-07-05 DIAGNOSIS — R37 Sexual dysfunction, unspecified: Secondary | ICD-10-CM | POA: Diagnosis not present

## 2017-07-05 DIAGNOSIS — M25562 Pain in left knee: Secondary | ICD-10-CM

## 2017-07-05 DIAGNOSIS — Z823 Family history of stroke: Secondary | ICD-10-CM | POA: Insufficient documentation

## 2017-07-05 DIAGNOSIS — M9983 Other biomechanical lesions of lumbar region: Secondary | ICD-10-CM | POA: Diagnosis not present

## 2017-07-05 DIAGNOSIS — Z9889 Other specified postprocedural states: Secondary | ICD-10-CM | POA: Diagnosis not present

## 2017-07-05 DIAGNOSIS — M5441 Lumbago with sciatica, right side: Secondary | ICD-10-CM

## 2017-07-05 DIAGNOSIS — Z79891 Long term (current) use of opiate analgesic: Secondary | ICD-10-CM

## 2017-07-05 MED ORDER — GABAPENTIN 300 MG PO CAPS: 300.0000 mg | ORAL_CAPSULE | Freq: Three times a day (TID) | ORAL | 0 refills | Status: DC

## 2017-07-05 MED ORDER — OXYCODONE HCL 10 MG PO TABS: 10.0000 mg | ORAL_TABLET | Freq: Four times a day (QID) | ORAL | 0 refills | Status: DC | PRN

## 2017-07-05 MED ORDER — VIAGRA 100 MG PO TABS: 100.0000 mg | ORAL_TABLET | ORAL | 1 refills | Status: DC | PRN

## 2017-07-05 NOTE — Progress Notes (Signed)
Nursing Pain Medication Assessment:  Safety precautions to be maintained throughout the outpatient stay will include: orient to surroundings, keep bed in low position, maintain call bell within reach at all times, provide assistance with transfer out of bed and ambulation.  Medication Inspection Compliance: Pill count conducted under aseptic conditions, in front of the patient. Neither the pills nor the bottle was removed from the patient's sight at any time. Once count was completed pills were immediately returned to the patient in their original bottle.  Medication: Oxycodone IR Pill/Patch Count: 47 of 120 pills remain Pill/Patch Appearance: Markings consistent with prescribed medication Bottle Appearance: Standard pharmacy container. Clearly labeled. Filled Date:11-29/ 2018 Last Medication intake:  Today

## 2017-07-05 NOTE — Progress Notes (Signed)
Patient's Name: Nathan Lambert.  MRN: [id number]  Referring Provider: Vidal Schwalbe, MD  DOB: [birth year]  PCP: Vidal Schwalbe, MD  DOS: 07/05/2017  Note by: Vevelyn Francois NP  Service setting: Ambulatory outpatient  Specialty: Interventional Pain Management  Location: [facility] (AMB) Pain Management Facility    Patient type: Established    Primary Reason(s) for Visit: Encounter for prescription drug management. (Level of risk: moderate)  CC: Back Pain (lower)  HPI  Nathan Lambert is a 36 y.o. year old, male patient, who comes today for a medication management evaluation. 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 His primarily concern today is the Back Pain (lower)  Pain Assessment: Location: Lower Back Radiating:  N/A Onset: More than a month ago Duration: Chronic pain Quality: Aching Severity: 3 /10 (self-reported pain score)  Note: Reported level is compatible with observation.                          Timing: Constant Modifying factors: medications  Nathan Lambert was last scheduled for an appointment on 06/08/2017 for medication management. During today's appointment we reviewed Nathan Lambert chronic pain status, as well as his outpatient medication regimen. He admits that his pain  is stable. He is concern about his testosterone level. He has not followed up with his primary care Provider but will call today.   The patient  reports that he does not use drugs. His body mass index is 37.66 kg/m.  Further details on both, my assessment(s), as well as the proposed treatment plan, please see below.  Controlled Substance Pharmacotherapy Assessment REMS (Risk Evaluation and Mitigation Strategy)  Analgesic:oxycodone 10 mg 4 times daily MME/day:18m/day. WLandis Martins RN  07/05/2017  9:33 AM  Sign at close encounter Nursing Pain Medication Assessment:  Safety precautions to be maintained throughout the outpatient stay will include: orient to surroundings, keep bed in low position, maintain call bell within reach at all times, provide assistance with transfer out of bed and ambulation.  Medication Inspection Compliance: Pill count conducted under aseptic conditions, in front of the patient. Neither the pills nor the bottle was removed from the patient's sight at any time. Once count was completed pills were immediately returned to the patient in their original bottle.  Medication: Oxycodone IR Pill/Patch Count: 47 of 120 pills remain Pill/Patch Appearance: Markings consistent with prescribed medication Bottle Appearance: Standard pharmacy container. Clearly labeled. Filled Date:11-29/ 2018 Last Medication intake:  Today   Pharmacokinetics: Liberation and absorption (onset of action): WNL Distribution (time to peak effect): WNL Metabolism and excretion (duration of action): WNL         Pharmacodynamics: Desired effects: Analgesia: Mr. CCinquereports >50% benefit. Functional ability: Patient reports that medication allows him to accomplish basic ADLs Clinically meaningful improvement in function (CMIF): Sustained CMIF goals met Perceived effectiveness: Described as relatively effective, allowing for increase in activities of daily living (ADL) Undesirable  effects: Side-effects or  Adverse reactions: None reported Monitoring: [place] PMP: Online review of the past 49-monthperiod conducted. Compliant with practice rules and regulations Last UDS on record: Summary  Date Value Ref Range Status  06/08/2017 FINAL  Final    Comment:    ==================================================================== TOXASSURE SELECT 13 (MW) ==================================================================== Test                             Result       Flag       Units Drug Present and Declared for Prescription Verification   Oxycodone                      1000         EXPECTED   ng/mg creat   Oxymorphone                    1590         EXPECTED   ng/mg creat   Noroxycodone                   892          EXPECTED   ng/mg creat   Noroxymorphone                 370          EXPECTED   ng/mg creat    Sources of oxycodone are scheduled prescription medications.    Oxymorphone, noroxycodone, and noroxymorphone are expected    metabolites of oxycodone. Oxymorphone is also available as a    scheduled prescription medication. ==================================================================== Test                      Result    Flag   Units      Ref Range   Creatinine              200              mg/dL      >=20 ==================================================================== Declared Medications:  The flagging and interpretation on this report are based on the  following declared medications.  Unexpected results may arise from  inaccuracies in the declared medications.  **Note: The testing scope of this panel includes these medications:  Oxycodone  **Note: The testing scope of this panel does not include following  reported medications:  Gabapentin  Loratadine  Montelukast  Naloxone  Sildenafil ==================================================================== For clinical consultation, please call [phone]  [phone] ====================================================================    UDS interpretation: Compliant          Medication Assessment Form: Reviewed. Patient indicates being compliant with therapy Treatment compliance: Compliant Risk Assessment Profile: Aberrant behavior: See prior evaluations. None observed or detected today Comorbid factors increasing risk of overdose: See prior notes. No additional risks detected today Risk of substance use disorder (SUD): Low Opioid Risk Tool - 06/08/17 0914      Family History of Substance Abuse   Alcohol  Negative    Illegal Drugs  Negative    Rx Drugs  Negative      Personal History of Substance Abuse   Alcohol  Positive Male or Male    Illegal Drugs  Negative    Rx Drugs  Negative      Age   Age between 136-45years   No      History of Preadolescent Sexual Abuse   History of Preadolescent Sexual Abuse  Negative or  Male      Psychological Disease   Psychological Disease  Negative    Depression  Negative      Total Score   Opioid Risk Tool Scoring  3    Opioid Risk Interpretation  Low Risk      ORT Scoring interpretation table:  Score <3 = Low Risk for SUD  Score between 4-7 = Moderate Risk for SUD  Score >8 = High Risk for Opioid Abuse   Risk Mitigation Strategies:  Patient Counseling: Covered Patient-Prescriber Agreement (PPA): Present and active  Notification to other healthcare providers: Done  Pharmacologic Plan: No change in therapy, at this time  Laboratory Chemistry  Inflammation Markers (CRP: Acute Phase) (ESR: Chronic Phase) Lab Results  Component Value Date   CRP 8.5 (H) 12/27/2016   ESRSEDRATE 35 (H) 12/27/2016                 Rheumatology Markers No results found for: Elayne Guerin, [facility]              Renal Function Markers Lab Results  Component Value Date   BUN 6 12/27/2016   CREATININE 0.83 12/27/2016   GFRAA 133 12/27/2016   GFRNONAA 115 12/27/2016                  Hepatic Function Markers Lab Results  Component Value Date   AST 11 12/27/2016   ALT 15 12/27/2016   ALBUMIN 4.2 12/27/2016   ALKPHOS 56 12/27/2016                 Electrolytes Lab Results  Component Value Date   NA 144 12/27/2016   K 4.3 12/27/2016   CL 106 12/27/2016   CALCIUM 9.0 12/27/2016   MG 2.1 12/27/2016                 Neuropathy Markers Lab Results  Component Value Date   VITAMINB12 462 12/27/2016                 Bone Pathology Markers Lab Results  Component Value Date   25OHVITD1 17 (L) 12/27/2016   25OHVITD2 <1.0 12/27/2016   25OHVITD3 17 12/27/2016   TESTOFREE 2.6 (L) 06/08/2017   TESTOSTERONE 116 (L) 06/08/2017                 Coagulation Parameters No results found for: INR, LABPROT, APTT, PLT, DDIMER               Cardiovascular Markers No results found for: BNP, CKTOTAL, CKMB, TROPONINI, HGB, HCT               CA Markers No results found for: CEA, CA125, LABCA2               Note: Lab results reviewed.  Recent Diagnostic Imaging Results  DG C-Arm 1-60 Min-No Report Fluoroscopy was utilized by the requesting physician.  No radiographic  interpretation.   Complexity Note: Imaging results reviewed. Results shared with Mr. Stracke, using Layman's terms.                         Meds   Current Outpatient Medications:  .  [START ON 07/16/2017] gabapentin (NEURONTIN) 300 MG capsule, Take 1-3 capsules (300-900 mg total) by mouth 3 (three) times daily. Follow written titration schedule., Disp: 270 capsule, Rfl: 0 .  loratadine (CLARITIN) 10 MG tablet, Take 10 mg by mouth daily as needed for allergies., Disp: ,  Rfl:  .  montelukast (SINGULAIR) 10 MG tablet, Take 10 mg by mouth at bedtime., Disp: , Rfl:  .  naloxone (NARCAN) 2 MG/2ML injection, Inject content of syringe into thigh muscle. Call 911., Disp: 2 Syringe, Rfl: 1 .  [START ON 07/16/2017] Oxycodone HCl 10 MG TABS, Take 1 tablet (10 mg total) by mouth every 6 (six) hours as  needed., Disp: 120 tablet, Rfl: 0 .  VIAGRA 100 MG tablet, Take 1 tablet (100 mg total) by mouth as needed for erectile dysfunction., Disp: 20 tablet, Rfl: 1 .  [START ON 08/15/2017] Oxycodone HCl 10 MG TABS, Take 1 tablet (10 mg total) by mouth every 6 (six) hours as needed., Disp: 120 tablet, Rfl: 0  ROS  Constitutional: Denies any fever or chills Gastrointestinal: No reported hemesis, hematochezia, vomiting, or acute GI distress Musculoskeletal: Denies any acute onset joint swelling, redness, loss of ROM, or weakness Neurological: No reported episodes of acute onset apraxia, aphasia, dysarthria, agnosia, amnesia, paralysis, loss of coordination, or loss of consciousness  Allergies  Mr. Lodes is allergic to bactrim [sulfamethoxazole-trimethoprim].  PFSH  Drug: Mr. Schreur  reports that he does not use drugs. Alcohol:  reports that he does not drink alcohol. Tobacco:  reports that he has been smoking cigarettes.  He has a 10.00 pack-year smoking history. he has never used smokeless tobacco. Medical:  has a past medical history of Allergy (2012), Chronic low back pain (Primary Area of Pain) (Bilateral) (R>L), Degeneration of lumbar or lumbosacral intervertebral disc (L4-L5) (12/27/2016), Headache, and Opiate use (02/15/2017). Surgical: Mr. Llanas  has a past surgical history that includes Fracture surgery (Left); Spine surgery (Right, 06/16/2015); and Back surgery. Family: family history includes AAA (abdominal aortic aneurysm) in his mother; Diabetes in his father; Stroke in his mother.  Constitutional Exam  General appearance: Well nourished, well developed, and well hydrated. In no apparent acute distress Vitals:   07/05/17 0926  BP: 121/72  Pulse: 61  Resp: 16  Temp: 98.2 F (36.8 C)  TempSrc: Oral  SpO2: 99%  Weight: 270 lb (122.5 kg)  Height: 5' 11"  (1.803 m)   BMI Assessment: Estimated body mass index is 37.66 kg/m as calculated from the following:   Height as  of this encounter: 5' 11"  (1.803 m).   Weight as of this encounter: 270 lb (122.5 kg). Psych/Mental status: Alert, oriented x 3 (person, place, & time)       Eyes: PERLA Respiratory: No evidence of acute respiratory distress  Cervical Spine Area Exam  Skin & Axial Inspection: No masses, redness, edema, swelling, or associated skin lesions Alignment: Symmetrical Functional ROM: Unrestricted ROM      Stability: No instability detected Muscle Tone/Strength: Functionally intact. No obvious neuro-muscular anomalies detected. Sensory (Neurological): Unimpaired Palpation: No palpable anomalies              Upper Extremity (UE) Exam    Side: Right upper extremity  Side: Left upper extremity  Skin & Extremity Inspection: Skin color, temperature, and hair growth are WNL. No peripheral edema or cyanosis. No masses, redness, swelling, asymmetry, or associated skin lesions. No contractures.  Skin & Extremity Inspection: Skin color, temperature, and hair growth are WNL. No peripheral edema or cyanosis. No masses, redness, swelling, asymmetry, or associated skin lesions. No contractures.  Functional ROM: Unrestricted ROM          Functional ROM: Unrestricted ROM          Muscle Tone/Strength: Functionally intact. No obvious neuro-muscular anomalies detected.  Muscle Tone/Strength: Functionally intact. No obvious neuro-muscular anomalies detected.  Sensory (Neurological): Unimpaired          Sensory (Neurological): Unimpaired          Palpation: No palpable anomalies              Palpation: No palpable anomalies              Specialized Test(s): Deferred         Specialized Test(s): Deferred          Thoracic Spine Area Exam  Skin & Axial Inspection: No masses, redness, or swelling Alignment: Symmetrical Functional ROM: Unrestricted ROM Stability: No instability detected Muscle Tone/Strength: Functionally intact. No obvious neuro-muscular anomalies detected. Sensory (Neurological): Unimpaired Muscle  strength & Tone: No palpable anomalies  Lumbar Spine Area Exam  Skin & Axial Inspection: No masses, redness, or swelling Alignment: Symmetrical Functional ROM: Unrestricted ROM      Stability: No instability detected Muscle Tone/Strength: Functionally intact. No obvious neuro-muscular anomalies detected. Sensory (Neurological): Unimpaired Palpation: Complains of area being tender to palpation       Provocative Tests: Lumbar Hyperextension and rotation test: evaluation deferred today       Lumbar Lateral bending test: evaluation deferred today       Patrick's Maneuver: evaluation deferred today                    Gait & Posture Assessment  Ambulation: Unassisted Gait: Relatively normal for age and body habitus Posture: WNL   Lower Extremity Exam    Side: Right lower extremity  Side: Left lower extremity  Skin & Extremity Inspection: Skin color, temperature, and hair growth are WNL. No peripheral edema or cyanosis. No masses, redness, swelling, asymmetry, or associated skin lesions. No contractures.  Skin & Extremity Inspection: Skin color, temperature, and hair growth are WNL. No peripheral edema or cyanosis. No masses, redness, swelling, asymmetry, or associated skin lesions. No contractures.  Functional ROM: Unrestricted ROM          Functional ROM: Unrestricted ROM          Muscle Tone/Strength: Functionally intact. No obvious neuro-muscular anomalies detected.  Muscle Tone/Strength: Functionally intact. No obvious neuro-muscular anomalies detected.  Sensory (Neurological): Unimpaired  Sensory (Neurological): Unimpaired  Palpation: No palpable anomalies  Palpation: No palpable anomalies   Assessment  Primary Diagnosis & Pertinent Problem List: The primary encounter diagnosis was Chronic low back pain (Primary Area of Pain) (Bilateral) (R>L). 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  Status Diagnosis  Controlled Controlled Controlled 1. Chronic low back pain (Primary Area of Pain) (Bilateral) (R>L)   2. 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 Chronic knee pain (Secondary Area of pain) (Left)   4. Chronic pain syndrome   5. Long term (current) use of opiate analgesic   6. Sexual dysfunction     Problems updated and reviewed during this visit: No problems updated. Plan of Care  Pharmacotherapy (Medications Ordered): Meds ordered this encounter  Medications  . Oxycodone HCl 10 MG TABS    Sig: Take 1 tablet (10 mg total) by mouth every 6 (six) hours as needed.    Dispense:  120 tablet    Refill:  0    Do not place this medication, or any other prescription from our practice,  on "Automatic Refill". Patient may have prescription filled one day early if pharmacy is closed on scheduled refill date. Do not fill until: 07/16/2017 To last until: 08/15/2017    Order Specific Question:   Supervising Provider    Answer:   Milinda Pointer [phone]  . gabapentin (NEURONTIN) 300 MG capsule    Sig: Take 1-3 capsules (300-900 mg total) by mouth 3 (three) times daily. Follow written titration schedule.    Dispense:  270 capsule    Refill:  0    Do not place medication on "Automatic Refill". Fill one day early if pharmacy is closed on scheduled refill date.    Order Specific Question:   Supervising Provider    Answer:   Milinda Pointer [phone]  . Oxycodone HCl 10 MG TABS    Sig: Take 1 tablet (10 mg total) by mouth every 6 (six) hours as needed.    Dispense:  120 tablet    Refill:  0    Do not place this medication, or any other prescription from our practice, on "Automatic Refill". Patient may have prescription filled one day early if pharmacy is closed on scheduled refill date. Do not fill until: 08/15/2017 To last until:09/14/2017    Order Specific  Question:   Supervising Provider    Answer:   Milinda Pointer [phone]  . VIAGRA 100 MG tablet    Sig: Take 1 tablet (100 mg total) by mouth as needed for erectile dysfunction.    Dispense:  20 tablet    Refill:  1    Order Specific Question:   Supervising Provider    Answer:   Milinda Pointer [phone]  This SmartLink is deprecated. Use AVSMEDLIST instead to display the medication list for a patient. Medications administered today: Kevyn L. Tomassi Jr. had no medications administered during this visit. Lab-work, procedure(s), and/or referral(s): No orders of the defined types were placed in this encounter.  Imaging and/or referral(s): None  Interventional therapies: Planned, scheduled, and/or pending: Not at this time    Considering:  Diagnostic bilateral lumbar facet block Possible bilateral lumbar facet RFA Diagnostic bilateral sacroiliac joint block Possible bilateral sacroiliac joint RFA Diagnostic caudal epidural steroid injection + diagnostic epidurogram Possible Racz procedure Diagnostic left intra-articular knee injection with local anesthetic and steroid Possible left series of 5 intra-articular Hyalgan knee injections Diagnostic left Genicular nerve block Possible left Genicular nerve RFA   Palliative PRN treatment(s):  None at this time   Provider-requested follow-up: Return in about 2 months (around 09/05/2017) for MedMgmt with Me Donella Stade Edison Pace).  Future Appointments  Date Time Provider [place]  08/30/2017  9:45 AM Vevelyn Francois, NP [facility] None   Primary Care Physician: Vidal Schwalbe, MD Location: [facility] Note by: Vevelyn Francois NP Date: 07/05/2017; Time: 3:57 PM  Pain Score Disclaimer: We use the NRS-11 scale. This is a self-reported, subjective measurement of pain severity with only modest accuracy. It is used primarily to identify changes within a particular patient. It must be  understood that outpatient pain scales are significantly less accurate that those used for research, where they can be applied under ideal controlled circumstances with minimal exposure to variables. In reality, the score is likely to be a combination of pain intensity and pain affect, where pain affect describes the degree of emotional arousal or changes in action readiness caused by the sensory experience of pain. Factors such as social and work situation, setting, emotional state, anxiety levels, expectation, and prior pain experience  may influence pain perception and show large inter-individual differences that may also be affected by time variables.  Patient instructions provided during this appointment: Patient Instructions   ____________________________________________________________________________________________  Medication Rules  Applies to: All patients receiving prescriptions (written or electronic).  Pharmacy of record: Pharmacy where electronic prescriptions will be sent. If written prescriptions are taken to a different pharmacy, please inform the nursing staff. The pharmacy listed in the electronic medical record should be the one where you would like electronic prescriptions to be sent.  Prescription refills: Only during scheduled appointments. Applies to both, written and electronic prescriptions.  NOTE: The following applies primarily to controlled substances (Opioid* Pain Medications).   Patient's responsibilities: 1. Pain Pills: Bring all pain pills to every appointment (except for procedure appointments). 2. Pill Bottles: Bring pills in original pharmacy bottle. Always bring newest bottle. Bring bottle, even if empty. 3. Medication refills: You are responsible for knowing and keeping track of what medications you need refilled. The day before your appointment, write a list of all prescriptions that need to be refilled. Bring that list to your appointment and give it to the  admitting nurse. Prescriptions will be written only during appointments. If you forget a medication, it will not be "Called in", "Faxed", or "electronically sent". You will need to get another appointment to get these prescribed. 4. Prescription Accuracy: You are responsible for carefully inspecting your prescriptions before leaving our office. Have the discharge nurse carefully go over each prescription with you, before taking them home. Make sure that your name is accurately spelled, that your address is correct. Check the name and dose of your medication to make sure it is accurate. Check the number of pills, and the written instructions to make sure they are clear and accurate. Make sure that you are given enough medication to last until your next medication refill appointment. 5. Taking Medication: Take medication as prescribed. Never take more pills than instructed. Never take medication more frequently than prescribed. Taking less pills or less frequently is permitted and encouraged, when it comes to controlled substances (written prescriptions).  6. Inform other Doctors: Always inform, all of your healthcare providers, of all the medications you take. 7. Pain Medication from other Providers: You are not allowed to accept any additional pain medication from any other Doctor or Healthcare provider. There are two exceptions to this rule. (see below) In the event that you require additional pain medication, you are responsible for notifying us, as stated below. 8. Medication Agreement: You are responsible for carefully reading and following our Medication Agreement. This must be signed before receiving any prescriptions from our practice. Safely store a copy of your signed Agreement. Violations to the Agreement will result in no further prescriptions. (Additional copies of our Medication Agreement are available upon request.) 9. Laws, Rules, & Regulations: All patients are expected to follow all Federal  and [employer], Rules, [employer]. Ignorance of the Laws does not constitute a valid excuse. The use of any illegal substances is prohibited. 10. Adopted CDC guidelines & recommendations: Target dosing levels will be at or below 60 MME/day. Use of benzodiazepines** is not recommended.  Exceptions: There are only two exceptions to the rule of not receiving pain medications from other Healthcare Providers. 1. Exception #1 (Emergencies): In the event of an emergency (i.e.: accident requiring emergency care), you are allowed to receive additional pain medication. However, you are responsible for: As soon as you are able, call our office [phone], at any  time of the day or night, and leave a message stating your name, the date and nature of the emergency, and the name and dose of the medication prescribed. In the event that your call is answered by a member of our staff, make sure to document and save the date, time, and the name of the person that took your information.  2. Exception #2 (Planned Surgery): In the event that you are scheduled by another doctor or dentist to have any type of surgery or procedure, you are allowed (for a period no longer than 30 days), to receive additional pain medication, for the acute post-op pain. However, in this case, you are responsible for picking up a copy of our "Post-op Pain Management for Surgeons" handout, and giving it to your surgeon or dentist. This document is available at our office, and does not require an appointment to obtain it. Simply go to our office during business hours (Monday-Thursday from 8:00 AM to 4:00 PM) (Friday 8:00 AM to 12:00 Noon) or if you have a scheduled appointment with [place], prior to your surgery, and ask for it by name. In addition, you will need to provide [place] with your name, name of your surgeon, type of surgery, and date of procedure or surgery.  *Opioid medications include: morphine, codeine, oxycodone, oxymorphone,  hydrocodone, hydromorphone, meperidine, tramadol, tapentadol, buprenorphine, fentanyl, methadone. **Benzodiazepine medications include: diazepam (Valium), alprazolam (Xanax), clonazepam (Klonopine), lorazepam (Ativan), clorazepate (Tranxene), chlordiazepoxide (Librium), estazolam (Prosom), oxazepam (Serax), temazepam (Restoril), triazolam (Halcion)  ____________________________________________________________________________________________  Oxycodone prescription given x2.

## 2017-07-05 NOTE — Patient Instructions (Addendum)
____________________________________________________________________________________________  Medication Rules  Applies to: All patients receiving prescriptions (written or electronic).  Pharmacy of record: Pharmacy where electronic prescriptions will be sent. If written prescriptions are taken to a different pharmacy, please inform the nursing staff. The pharmacy listed in the electronic medical record should be the one where you would like electronic prescriptions to be sent.  Prescription refills: Only during scheduled appointments. Applies to both, written and electronic prescriptions.  NOTE: The following applies primarily to controlled substances (Opioid* Pain Medications).   Patient's responsibilities: 1. Pain Pills: Bring all pain pills to every appointment (except for procedure appointments). 2. Pill Bottles: Bring pills in original pharmacy bottle. Always bring newest bottle. Bring bottle, even if empty. 3. Medication refills: You are responsible for knowing and keeping track of what medications you need refilled. The day before your appointment, write a list of all prescriptions that need to be refilled. Bring that list to your appointment and give it to the admitting nurse. Prescriptions will be written only during appointments. If you forget a medication, it will not be "Called in", "Faxed", or "electronically sent". You will need to get another appointment to get these prescribed. 4. Prescription Accuracy: You are responsible for carefully inspecting your prescriptions before leaving our office. Have the discharge nurse carefully go over each prescription with you, before taking them home. Make sure that your name is accurately spelled, that your address is correct. Check the name and dose of your medication to make sure it is accurate. Check the number of pills, and the written instructions to make sure they are clear and accurate. Make sure that you are given enough medication to  last until your next medication refill appointment. 5. Taking Medication: Take medication as prescribed. Never take more pills than instructed. Never take medication more frequently than prescribed. Taking less pills or less frequently is permitted and encouraged, when it comes to controlled substances (written prescriptions).  6. Inform other Doctors: Always inform, all of your healthcare providers, of all the medications you take. 7. Pain Medication from other Providers: You are not allowed to accept any additional pain medication from any other Doctor or Healthcare provider. There are two exceptions to this rule. (see below) In the event that you require additional pain medication, you are responsible for notifying us, as stated below. 8. Medication Agreement: You are responsible for carefully reading and following our Medication Agreement. This must be signed before receiving any prescriptions from our practice. Safely store a copy of your signed Agreement. Violations to the Agreement will result in no further prescriptions. (Additional copies of our Medication Agreement are available upon request.) 9. Laws, Rules, & Regulations: All patients are expected to follow all Federal and State Laws, Statutes, Rules, & Regulations. Ignorance of the Laws does not constitute a valid excuse. The use of any illegal substances is prohibited. 10. Adopted CDC guidelines & recommendations: Target dosing levels will be at or below 60 MME/day. Use of benzodiazepines** is not recommended.  Exceptions: There are only two exceptions to the rule of not receiving pain medications from other Healthcare Providers. 1. Exception #1 (Emergencies): In the event of an emergency (i.e.: accident requiring emergency care), you are allowed to receive additional pain medication. However, you are responsible for: As soon as you are able, call our office [phone], at any time of the day or night, and leave a message stating your  name, the date and nature of the emergency, and the name and dose of the medication   prescribed. In the event that your call is answered by a member of our staff, make sure to document and save the date, time, and the name of the person that took your information.  2. Exception #2 (Planned Surgery): In the event that you are scheduled by another doctor or dentist to have any type of surgery or procedure, you are allowed (for a period no longer than 30 days), to receive additional pain medication, for the acute post-op pain. However, in this case, you are responsible for picking up a copy of our "Post-op Pain Management for Surgeons" handout, and giving it to your surgeon or dentist. This document is available at our office, and does not require an appointment to obtain it. Simply go to our office during business hours (Monday-Thursday from 8:00 AM to 4:00 PM) (Friday 8:00 AM to 12:00 Noon) or if you have a scheduled appointment with us, prior to your surgery, and ask for it by name. In addition, you will need to provide us with your name, name of your surgeon, type of surgery, and date of procedure or surgery.  *Opioid medications include: morphine, codeine, oxycodone, oxymorphone, hydrocodone, hydromorphone, meperidine, tramadol, tapentadol, buprenorphine, fentanyl, methadone. **Benzodiazepine medications include: diazepam (Valium), alprazolam (Xanax), clonazepam (Klonopine), lorazepam (Ativan), clorazepate (Tranxene), chlordiazepoxide (Librium), estazolam (Prosom), oxazepam (Serax), temazepam (Restoril), triazolam (Halcion)  ____________________________________________________________________________________________  Oxycodone prescription given x2.

## 2017-08-30 ENCOUNTER — Encounter: Payer: BLUE CROSS/BLUE SHIELD | Admitting: Nurse Practitioner

## 2017-09-01 ENCOUNTER — Encounter: Payer: Self-pay | Admitting: Nurse Practitioner

## 2017-09-01 ENCOUNTER — Ambulatory Visit: Payer: BLUE CROSS/BLUE SHIELD | Attending: Nurse Practitioner | Admitting: Nurse Practitioner

## 2017-09-01 ENCOUNTER — Other Ambulatory Visit: Payer: Self-pay

## 2017-09-01 VITALS — BP 117/72 | HR 71 | Temp 98.1°F | Resp 18 | Ht 71.0 in | Wt 270.0 lb

## 2017-09-01 DIAGNOSIS — M25551 Pain in right hip: Secondary | ICD-10-CM | POA: Insufficient documentation

## 2017-09-01 DIAGNOSIS — Z79899 Other long term (current) drug therapy: Secondary | ICD-10-CM | POA: Diagnosis not present

## 2017-09-01 DIAGNOSIS — Z79891 Long term (current) use of opiate analgesic: Secondary | ICD-10-CM

## 2017-09-01 DIAGNOSIS — Z9889 Other specified postprocedural states: Secondary | ICD-10-CM | POA: Diagnosis not present

## 2017-09-01 DIAGNOSIS — G894 Chronic pain syndrome: Secondary | ICD-10-CM

## 2017-09-01 DIAGNOSIS — M47816 Spondylosis without myelopathy or radiculopathy, lumbar region: Secondary | ICD-10-CM | POA: Diagnosis not present

## 2017-09-01 DIAGNOSIS — E559 Vitamin D deficiency, unspecified: Secondary | ICD-10-CM | POA: Insufficient documentation

## 2017-09-01 DIAGNOSIS — Z833 Family history of diabetes mellitus: Secondary | ICD-10-CM | POA: Insufficient documentation

## 2017-09-01 DIAGNOSIS — M549 Dorsalgia, unspecified: Secondary | ICD-10-CM | POA: Diagnosis not present

## 2017-09-01 DIAGNOSIS — Z823 Family history of stroke: Secondary | ICD-10-CM | POA: Insufficient documentation

## 2017-09-01 DIAGNOSIS — Z8249 Family history of ischemic heart disease and other diseases of the circulatory system: Secondary | ICD-10-CM | POA: Insufficient documentation

## 2017-09-01 DIAGNOSIS — Z882 Allergy status to sulfonamides status: Secondary | ICD-10-CM | POA: Insufficient documentation

## 2017-09-01 DIAGNOSIS — R37 Sexual dysfunction, unspecified: Secondary | ICD-10-CM

## 2017-09-01 DIAGNOSIS — K5900 Constipation, unspecified: Secondary | ICD-10-CM | POA: Insufficient documentation

## 2017-09-01 DIAGNOSIS — M9983 Other biomechanical lesions of lumbar region: Secondary | ICD-10-CM

## 2017-09-01 DIAGNOSIS — M4807 Spinal stenosis, lumbosacral region: Secondary | ICD-10-CM | POA: Diagnosis not present

## 2017-09-01 DIAGNOSIS — M79605 Pain in left leg: Secondary | ICD-10-CM | POA: Insufficient documentation

## 2017-09-01 DIAGNOSIS — F17210 Nicotine dependence, cigarettes, uncomplicated: Secondary | ICD-10-CM | POA: Insufficient documentation

## 2017-09-01 DIAGNOSIS — M48061 Spinal stenosis, lumbar region without neurogenic claudication: Secondary | ICD-10-CM | POA: Diagnosis not present

## 2017-09-01 MED ORDER — GABAPENTIN 300 MG PO CAPS: 300.0000 mg | ORAL_CAPSULE | Freq: Three times a day (TID) | ORAL | 1 refills | Status: DC

## 2017-09-01 MED ORDER — OXYCODONE HCL 10 MG PO TABS: 10.0000 mg | ORAL_TABLET | Freq: Four times a day (QID) | ORAL | 0 refills | Status: DC | PRN

## 2017-09-01 MED ORDER — VIAGRA 100 MG PO TABS: 100.0000 mg | ORAL_TABLET | ORAL | 1 refills | Status: DC | PRN

## 2017-09-01 NOTE — Progress Notes (Signed)
Nursing Pain Medication Assessment:  Safety precautions to be maintained throughout the outpatient stay will include: orient to surroundings, keep bed in low position, maintain call bell within reach at all times, provide assistance with transfer out of bed and ambulation.  Medication Inspection Compliance: Pill count conducted under aseptic conditions, in front of the patient. Neither the pills nor the bottle was removed from the patient's sight at any time. Once count was completed pills were immediately returned to the patient in their original bottle.  Medication: Oxycodone IR Pill/Patch Count: 58 of 120 pills remain Pill/Patch Appearance: Markings consistent with prescribed medication Bottle Appearance: Standard pharmacy container. Clearly labeled. Filled Date: 01 / 28 / 2019 Last Medication intake:  Today

## 2017-09-01 NOTE — Progress Notes (Signed)
Patient's Name: Nathan Lambert.  MRN: [id number]  Referring Provider: Vidal Schwalbe, MD  DOB: 1980/10/18  PCP: Vidal Schwalbe, MD  DOS: 09/01/2017  Note by: Vevelyn Francois NP  Service setting: Ambulatory outpatient  Specialty: Interventional Pain Management  Location: [facility] (AMB) Pain Management Facility    Patient type: Established    Primary Reason(s) for Visit: Encounter for prescription drug management. (Level of risk: moderate)  CC: Back Pain (low > on right) and Hip Pain (right)  HPI  Nathan Lambert is a 37 y.o. year old, male patient, who comes today for a medication management evaluation. He has Intractable episodic cluster headache; Chronic pain syndrome; Degeneration of lumbar or lumbosacral intervertebral disc (L4-L5); Chronic low back pain (Primary Area of Pain) (Bilateral) (R>L); Sacroiliac joint pain (Bilateral) (R>L); Vitamin D deficiency; DDD (degenerative disc disease), lumbar; Chronic knee pain (Secondary Area of pain) (Left); Lumbar foraminal stenosis(L4-5 and L5-S1) (Bilateral); Failed back surgical syndrome; Chronic lower extremity pain (Bilateral) (L>R); Long term (current) use of opiate analgesic; Long term prescription opiate use; Opiate use (60 MME/Day); Lumbar facet hypertrophy (Bilateral); Lumbar facet syndrome (Bilateral) (R>L); Neurogenic pain; Constipation; Sexual dysfunction; and Spondylosis without myelopathy or radiculopathy, lumbar region on their problem list. His primarily concern today is the Back Pain (low > on right) and Hip Pain (right)  Pain Assessment: Location: Right, Lower Back Radiating: right hip Onset: More than a month ago Duration: Chronic pain Quality: Aching, Constant Severity: 2 /10 (self-reported pain score)  Note: Reported level is compatible with observation.                          Effect on ADL:   Timing: Constant Modifying factors: medications rest   Nathan Lambert was last scheduled for an appointment on 07/05/2017 for  medication management. During today's appointment we reviewed Nathan Lambert chronic pain status, as well as his outpatient medication regimen. He admits that he continues to have pain and would like to move further with long term treatment relief. He completed physical therapy and aqua-therapy about one year ago. He admits that it was not effective in relieving his pain.   The patient  reports that he does not use drugs. His body mass index is 37.66 kg/m.  Further details on both, my assessment(s), as well as the proposed treatment plan, please see below.  Controlled Substance Pharmacotherapy Assessment REMS (Risk Evaluation and Mitigation Strategy)  Analgesic:oxycodone 10 mg 4 times daily MME/day:6m/day.   [name] RN  09/01/2017  9:16 AM  Sign at close encounter Nursing Pain Medication Assessment:  Safety precautions to be maintained throughout the outpatient stay will include: orient to surroundings, keep bed in low position, maintain call bell within reach at all times, provide assistance with transfer out of bed and ambulation.  Medication Inspection Compliance: Pill count conducted under aseptic conditions, in front of the patient. Neither the pills nor the bottle was removed from the patient's sight at any time. Once count was completed pills were immediately returned to the patient in their original bottle.  Medication: Oxycodone IR Pill/Patch Count: 58 of 120 pills remain Pill/Patch Appearance: Markings consistent with prescribed medication Bottle Appearance: Standard pharmacy container. Clearly labeled. Filled Date: 01 / 28 / 2019 Last Medication intake:  Today   Pharmacokinetics: Liberation and absorption (onset of action): WNL Distribution (time to peak effect): WNL Metabolism and excretion (duration of action): WNL         Pharmacodynamics: Desired effects:  Analgesia: Nathan Lambert reports >50% benefit. Functional ability: Patient reports that  medication allows him to accomplish basic ADLs Clinically meaningful improvement in function (CMIF): Sustained CMIF goals met Perceived effectiveness: Described as relatively effective, allowing for increase in activities of daily living (ADL) Undesirable effects: Side-effects or Adverse reactions: None reported Monitoring: [place] PMP: Online review of the past 71-monthperiod conducted. Compliant with practice rules and regulations Last UDS on record: Summary  Date Value Ref Range Status  06/08/2017 FINAL  Final    Comment:    ==================================================================== TOXASSURE SELECT 13 (MW) ==================================================================== Test                             Result       Flag       Units Drug Present and Declared for Prescription Verification   Oxycodone                      1000         EXPECTED   ng/mg creat   Oxymorphone                    1590         EXPECTED   ng/mg creat   Noroxycodone                   892          EXPECTED   ng/mg creat   Noroxymorphone                 370          EXPECTED   ng/mg creat    Sources of oxycodone are scheduled prescription medications.    Oxymorphone, noroxycodone, and noroxymorphone are expected    metabolites of oxycodone. Oxymorphone is also available as a    scheduled prescription medication. ==================================================================== Test                      Result    Flag   Units      Ref Range   Creatinine              200              mg/dL      >=20 ==================================================================== Declared Medications:  The flagging and interpretation on this report are based on the  following declared medications.  Unexpected results may arise from  inaccuracies in the declared medications.  **Note: The testing scope of this panel includes these medications:  Oxycodone  **Note: The testing scope of this panel does not include  following  reported medications:  Gabapentin  Loratadine  Montelukast  Naloxone  Sildenafil ==================================================================== For clinical consultation, please call ([phone] ====================================================================    UDS interpretation: Compliant          Medication Assessment Form: Reviewed. Patient indicates being compliant with therapy Treatment compliance: Compliant Risk Assessment Profile: Aberrant behavior: See prior evaluations. None observed or detected today Comorbid factors increasing risk of overdose: See prior notes. No additional risks detected today Risk of substance use disorder (SUD): Low Opioid Risk Tool - 09/01/17 0914      Family History of Substance Abuse   Alcohol  Negative    Illegal Drugs  Negative    Rx Drugs  Negative      Personal History of Substance Abuse   Alcohol  Negative    Illegal  Drugs  Negative    Rx Drugs  Negative      Age   Age between 2-45 years   Yes      History of Preadolescent Sexual Abuse   History of Preadolescent Sexual Abuse  Negative or Male      Psychological Disease   Psychological Disease  Negative    Depression  Negative      Total Score   Opioid Risk Tool Scoring  1    Opioid Risk Interpretation  Low Risk      ORT Scoring interpretation table:  Score <3 = Low Risk for SUD  Score between 4-7 = Moderate Risk for SUD  Score >8 = High Risk for Opioid Abuse   Risk Mitigation Strategies:  Patient Counseling: Covered Patient-Prescriber Agreement (PPA): Present and active  Notification to other healthcare providers: Done  Pharmacologic Plan: No change in therapy, at this time.             Laboratory Chemistry  Inflammation Markers (CRP: Acute Phase) (ESR: Chronic Phase) Lab Results  Component Value Date   CRP 8.5 (H) 12/27/2016   ESRSEDRATE 35 (H) 12/27/2016                         Rheumatology Markers No results found for: Elayne Guerin, [facility]              Renal Function Markers Lab Results  Component Value Date   BUN 6 12/27/2016   CREATININE 0.83 12/27/2016   GFRAA 133 12/27/2016   GFRNONAA 115 12/27/2016                 Hepatic Function Markers Lab Results  Component Value Date   AST 11 12/27/2016   ALT 15 12/27/2016   ALBUMIN 4.2 12/27/2016   ALKPHOS 56 12/27/2016                 Electrolytes Lab Results  Component Value Date   NA 144 12/27/2016   K 4.3 12/27/2016   CL 106 12/27/2016   CALCIUM 9.0 12/27/2016   MG 2.1 12/27/2016                        Neuropathy Markers Lab Results  Component Value Date   VITAMINB12 462 12/27/2016                 Bone Pathology Markers Lab Results  Component Value Date   25OHVITD1 17 (L) 12/27/2016   25OHVITD2 <1.0 12/27/2016   25OHVITD3 17 12/27/2016   TESTOFREE 2.6 (L) 06/08/2017   TESTOSTERONE 116 (L) 06/08/2017                         Coagulation Parameters No results found for: INR, LABPROT, APTT, PLT, DDIMER               Cardiovascular Markers No results found for: BNP, CKTOTAL, CKMB, TROPONINI, HGB, HCT               CA Markers No results found for: CEA, CA125, LABCA2               Note: Lab results reviewed.  Recent Diagnostic Imaging Results  DG C-Arm 1-60 Min-No Report Fluoroscopy was utilized by the requesting physician.  No radiographic  interpretation.   Complexity Note: Imaging results reviewed. Results shared with Mr. Perea, using Layman's terms.  Meds   Current Outpatient Medications:  .  loratadine (CLARITIN) 10 MG tablet, Take 10 mg by mouth daily as needed for allergies., Disp: , Rfl:  .  montelukast (SINGULAIR) 10 MG tablet, Take 10 mg by mouth at bedtime., Disp: , Rfl:  .  naloxone (NARCAN) 2 MG/2ML injection, Inject content of syringe into thigh muscle. Call 911., Disp: 2 Syringe, Rfl: 1 .  testosterone cypionate (DEPOTESTOSTERONE CYPIONATE) 200 MG/ML  injection, Inject into the muscle every 7 (seven) days., Disp: , Rfl:  .  VIAGRA 100 MG tablet, Take 1 tablet (100 mg total) by mouth as needed for erectile dysfunction., Disp: 20 tablet, Rfl: 1 .  [START ON 09/14/2017] gabapentin (NEURONTIN) 300 MG capsule, Take 1-3 capsules (300-900 mg total) by mouth 3 (three) times daily. Follow written titration schedule., Disp: 270 capsule, Rfl: 1 .  [START ON 10/14/2017] Oxycodone HCl 10 MG TABS, Take 1 tablet (10 mg total) by mouth every 6 (six) hours as needed., Disp: 120 tablet, Rfl: 0 .  [START ON 09/14/2017] Oxycodone HCl 10 MG TABS, Take 1 tablet (10 mg total) by mouth every 6 (six) hours as needed., Disp: 120 tablet, Rfl: 0  ROS  Constitutional: Denies any fever or chills Gastrointestinal: No reported hemesis, hematochezia, vomiting, or acute GI distress Musculoskeletal: Denies any acute onset joint swelling, redness, loss of ROM, or weakness Neurological: No reported episodes of acute onset apraxia, aphasia, dysarthria, agnosia, amnesia, paralysis, loss of coordination, or loss of consciousness  Allergies  Mr. Brock is allergic to bactrim [sulfamethoxazole-trimethoprim].  PFSH  Drug: Mr. Bertz  reports that he does not use drugs. Alcohol:  reports that he does not drink alcohol. Tobacco:  reports that he has been smoking cigarettes.  He has a 10.00 pack-year smoking history. he has never used smokeless tobacco. Medical:  has a past medical history of Allergy (2012), Chronic low back pain (Primary Area of Pain) (Bilateral) (R>L), Degeneration of lumbar or lumbosacral intervertebral disc (L4-L5) (12/27/2016), Headache, and Opiate use (02/15/2017). Surgical: Mr. Ducharme  has a past surgical history that includes Fracture surgery (Left); Spine surgery (Right, 06/16/2015); and Back surgery. Family: family history includes AAA (abdominal aortic aneurysm) in his mother; Diabetes in his father; Stroke in his mother.  Constitutional Exam   General appearance: Well nourished, well developed, and well hydrated. In no apparent acute distress Vitals:   09/01/17 0908  BP: 117/72  Pulse: 71  Resp: 18  Temp: 98.1 F (36.7 C)  TempSrc: Oral  SpO2: 98%  Weight: 270 lb (122.5 kg)  Height: 5' 11"  (1.803 m)   BMI Assessment: Estimated body mass index is 37.66 kg/m as calculated from the following:   Height as of this encounter: 5' 11"  (1.803 m).   Weight as of this encounter: 270 lb (122.5 kg). Psych/Mental status: Alert, oriented x 3 (person, place, & time)       Eyes: PERLA Respiratory: No evidence of acute respiratory distress  Cervical Spine Area Exam  Skin & Axial Inspection: No masses, redness, edema, swelling, or associated skin lesions Alignment: Symmetrical Functional ROM: Unrestricted ROM      Stability: No instability detected Muscle Tone/Strength: Functionally intact. No obvious neuro-muscular anomalies detected. Sensory (Neurological): Unimpaired Palpation: No palpable anomalies              Upper Extremity (UE) Exam    Side: Right upper extremity  Side: Left upper extremity  Skin & Extremity Inspection: Skin color, temperature, and hair growth are WNL. No peripheral edema or  cyanosis. No masses, redness, swelling, asymmetry, or associated skin lesions. No contractures.  Skin & Extremity Inspection: Skin color, temperature, and hair growth are WNL. No peripheral edema or cyanosis. No masses, redness, swelling, asymmetry, or associated skin lesions. No contractures.  Functional ROM: Unrestricted ROM          Functional ROM: Unrestricted ROM          Muscle Tone/Strength: Functionally intact. No obvious neuro-muscular anomalies detected.  Muscle Tone/Strength: Functionally intact. No obvious neuro-muscular anomalies detected.  Sensory (Neurological): Unimpaired          Sensory (Neurological): Unimpaired          Palpation: No palpable anomalies              Palpation: No palpable anomalies               Specialized Test(s): Deferred         Specialized Test(s): Deferred          Thoracic Spine Area Exam  Skin & Axial Inspection: No masses, redness, or swelling Alignment: Symmetrical Functional ROM: Unrestricted ROM Stability: No instability detected Muscle Tone/Strength: Functionally intact. No obvious neuro-muscular anomalies detected. Sensory (Neurological): Unimpaired Muscle strength & Tone: No palpable anomalies  Lumbar Spine Area Exam  Skin & Axial Inspection: No masses, redness, or swelling Alignment: Symmetrical Functional ROM: Unrestricted ROM      Stability: No instability detected Muscle Tone/Strength: Functionally intact. No obvious neuro-muscular anomalies detected. Sensory (Neurological): Unimpaired Palpation: Complains of area being tender to palpation       Provocative Tests: Lumbar Hyperextension and rotation test: Positive on the right for facet joint pain. Lumbar Lateral bending test: evaluation deferred today       Patrick's Maneuver: evaluation deferred today                    Gait & Posture Assessment  Ambulation: Unassisted Gait: Relatively normal for age and body habitus Posture: WNL   Lower Extremity Exam    Side: Right lower extremity  Side: Left lower extremity  Skin & Extremity Inspection: Skin color, temperature, and hair growth are WNL. No peripheral edema or cyanosis. No masses, redness, swelling, asymmetry, or associated skin lesions. No contractures.  Skin & Extremity Inspection: Skin color, temperature, and hair growth are WNL. No peripheral edema or cyanosis. No masses, redness, swelling, asymmetry, or associated skin lesions. No contractures.  Functional ROM: Unrestricted ROM          Functional ROM: Unrestricted ROM          Muscle Tone/Strength: Functionally intact. No obvious neuro-muscular anomalies detected.  Muscle Tone/Strength: Functionally intact. No obvious neuro-muscular anomalies detected.  Sensory (Neurological): Unimpaired   Sensory (Neurological): Unimpaired  Palpation: No palpable anomalies  Palpation: No palpable anomalies   Assessment  Primary Diagnosis & Pertinent Problem List: The primary encounter diagnosis was Spondylosis without myelopathy or radiculopathy, lumbar region. 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  Status Diagnosis  Controlled Controlled Controlled 1. Spondylosis without myelopathy or radiculopathy, lumbar region   2. 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 Lumbar facet syndrome (Bilateral) (R>L)   4. Long term (current) use of opiate analgesic   5. Chronic pain syndrome   6. Sexual dysfunction     Problems updated and reviewed during this visit: Problem  Spondylosis Without  Myelopathy Or Radiculopathy, Lumbar Region   Plan of Care  Pharmacotherapy (Medications Ordered): Meds ordered this encounter  Medications  . gabapentin (NEURONTIN) 300 MG capsule    Sig: Take 1-3 capsules (300-900 mg total) by mouth 3 (three) times daily. Follow written titration schedule.    Dispense:  270 capsule    Refill:  1    Do not place medication on "Automatic Refill". Fill one day early if pharmacy is closed on scheduled refill date.    Order Specific Question:   Supervising Provider    Answer:   Milinda Pointer [phone]  . Oxycodone HCl 10 MG TABS    Sig: Take 1 tablet (10 mg total) by mouth every 6 (six) hours as needed.    Dispense:  120 tablet    Refill:  0    Do not place this medication, or any other prescription from our practice, on "Automatic Refill". Patient may have prescription filled one day early if pharmacy is closed on scheduled refill date. Do not fill until: 10/14/2017 To last until: 11/13/2017    Order Specific Question:   Supervising Provider    Answer:    Milinda Pointer [phone]  . Oxycodone HCl 10 MG TABS    Sig: Take 1 tablet (10 mg total) by mouth every 6 (six) hours as needed.    Dispense:  120 tablet    Refill:  0    Do not place this medication, or any other prescription from our practice, on "Automatic Refill". Patient may have prescription filled one day early if pharmacy is closed on scheduled refill date. Do not fill until: 09/14/2017 To last until: 10/14/2017    Order Specific Question:   Supervising Provider    Answer:   Milinda Pointer [phone]  . VIAGRA 100 MG tablet    Sig: Take 1 tablet (100 mg total) by mouth as needed for erectile dysfunction.    Dispense:  20 tablet    Refill:  1    Order Specific Question:   Supervising Provider    AnswerMilinda Pointer [phone]   New Prescriptions   No medications on file   Medications administered today: Breylen L. Devos Jr. had no medications administered during this visit. Lab-work, procedure(s), and/or referral(s): Orders Placed This Encounter  Procedures  . Radiofrequency,Lumbar   Medical Necessity: Mr. Mclaren has been dealing with the above chronic pain for longer than three months he  has failed to respond to conservative therapies including over-the-counter medications, anti-inflammatories, muscle relaxants, membrane stabilizers, opioids, physical therapy modalities such as heat and ice, as well as more invasive techniques such as nerve blocks. Because Mr. Zanni did attain more than 50% relief of the pain during a series of diagnostic blocks conducted in separate occasions, I believe it is medically necessary to proceed with Radiofrequency Ablation, in order to attempt gaining longer relief.  Interventional therapies: Planned, scheduled, and/or pending:   Right lumbar facet RFA   Considering:  Diagnostic bilateral lumbar facet block Possible bilateral lumbar facet RFA Diagnostic bilateral sacroiliac joint block Possible bilateral  sacroiliac joint RFA Diagnostic caudal epidural steroid injection + diagnostic epidurogram Possible Racz procedure Diagnostic left intra-articular knee injection with local anesthetic and steroid Possible left series of 5 intra-articular Hyalgan knee injections Diagnostic left Genicular nerve block Possible left Genicular nerve RFA   Palliative PRN treatment(s):  None at this time      Provider-requested follow-up: Return in about 2 months (around 10/30/2017) for MedMgmt with Me Donella Stade Edison Pace).  Future Appointments  Date Time Provider [place]  10/25/2017  9:45 AM Vevelyn Francois, NP [facility] None   Primary Care Physician: Vidal Schwalbe, MD Location: [facility] Out[facility] Note by: Vevelyn Francois NP Date: 09/01/2017; Time: 11:09 AM  Pain Score Disclaimer: We use the NRS-11 scale. This is a self-reported, subjective measurement of pain severity with only modest accuracy. It is used primarily to identify changes within a particular patient. It must be understood that outpatient pain scales are significantly less accurate that those used for research, where they can be applied under ideal controlled circumstances with minimal exposure to variables. In reality, the score is likely to be a combination of pain intensity and pain affect, where pain affect describes the degree of emotional arousal or changes in action readiness caused by the sensory experience of pain. Factors such as social and work situation, setting, emotional state, anxiety levels, expectation, and prior pain experience may influence pain perception and show large inter-individual differences that may also be affected by time variables.  Patient instructions provided during this appointment: Patient Instructions    ____________________________________________________________________________________________  Medication Rules  Applies to: All patients receiving prescriptions (written or  electronic).  Pharmacy of record: Pharmacy where electronic prescriptions will be sent. If written prescriptions are taken to a different pharmacy, please inform the nursing staff. The pharmacy listed in the electronic medical record should be the one where you would like electronic prescriptions to be sent.  Prescription refills: Only during scheduled appointments. Applies to both, written and electronic prescriptions.  NOTE: The following applies primarily to controlled substances (Opioid* Pain Medications).   Patient's responsibilities: 1. Pain Pills: Bring all pain pills to every appointment (except for procedure appointments). 2. Pill Bottles: Bring pills in original pharmacy bottle. Always bring newest bottle. Bring bottle, even if empty. 3. Medication refills: You are responsible for knowing and keeping track of what medications you need refilled. The day before your appointment, write a list of all prescriptions that need to be refilled. Bring that list to your appointment and give it to the admitting nurse. Prescriptions will be written only during appointments. If you forget a medication, it will not be "Called in", "Faxed", or "electronically sent". You will need to get another appointment to get these prescribed. 4. Prescription Accuracy: You are responsible for carefully inspecting your prescriptions before leaving our office. Have the discharge nurse carefully go over each prescription with you, before taking them home. Make sure that your name is accurately spelled, that your address is correct. Check the name and dose of your medication to make sure it is accurate. Check the number of pills, and the written instructions to make sure they are clear and accurate. Make sure that you are given enough medication to last until your next medication refill appointment. 5. Taking Medication: Take medication as prescribed. Never take more pills than instructed. Never take medication more  frequently than prescribed. Taking less pills or less frequently is permitted and encouraged, when it comes to controlled substances (written prescriptions).  6. Inform other Doctors: Always inform, all of your healthcare providers, of all the medications you take. 7. Pain Medication from other Providers: You are not allowed to accept any additional pain medication from any other Doctor or Healthcare provider. There are two exceptions to this rule. (see below) In the event that you require additional pain medication, you are responsible for notifying us, as stated below. 8. Medication Agreement: You are responsible for carefully reading and  following our Medication Agreement. This must be signed before receiving any prescriptions from our practice. Safely store a copy of your signed Agreement. Violations to the Agreement will result in no further prescriptions. (Additional copies of our Medication Agreement are available upon request.) 9. Laws, Rules, & Regulations: All patients are expected to follow all Federal and [employer], [employer], Rules, [employer] Care. Ignorance of the Laws does not constitute a valid excuse. The use of any illegal substances is prohibited. 10. Adopted CDC guidelines & recommendations: Target dosing levels will be at or below 60 MME/day. Use of benzodiazepines** is not recommended.  Exceptions: There are only two exceptions to the rule of not receiving pain medications from other Healthcare Providers. 1. Exception #1 (Emergencies): In the event of an emergency (i.e.: accident requiring emergency care), you are allowed to receive additional pain medication. However, you are responsible for: As soon as you are able, call our office [phone], at any time of the day or night, and leave a message stating your name, the date and nature of the emergency, and the name and dose of the medication prescribed. In the event that your call is answered by a member of our staff, make sure to  document and save the date, time, and the name of the person that took your information.  2. Exception #2 (Planned Surgery): In the event that you are scheduled by another doctor or dentist to have any type of surgery or procedure, you are allowed (for a period no longer than 30 days), to receive additional pain medication, for the acute post-op pain. However, in this case, you are responsible for picking up a copy of our "Post-op Pain Management for Surgeons" handout, and giving it to your surgeon or dentist. This document is available at our office, and does not require an appointment to obtain it. Simply go to our office during business hours (Monday-Thursday from 8:00 AM to 4:00 PM) (Friday 8:00 AM to 12:00 Noon) or if you have a scheduled appointment with [place], prior to your surgery, and ask for it by name. In addition, you will need to provide [place] with your name, name of your surgeon, type of surgery, and date of procedure or surgery.  *Opioid medications include: morphine, codeine, oxycodone, oxymorphone, hydrocodone, hydromorphone, meperidine, tramadol, tapentadol, buprenorphine, fentanyl, methadone. **Benzodiazepine medications include: diazepam (Valium), alprazolam (Xanax), clonazepam (Klonopine), lorazepam (Ativan), clorazepate (Tranxene), chlordiazepoxide (Librium), estazolam (Prosom), oxazepam (Serax), temazepam (Restoril), triazolam (Halcion)  ____________________________________________________________________________________________  Pain Management Discharge Instructions  General Discharge Instructions :  If you need to reach your doctor call: Monday-Friday 8:00 am - 4:00 pm at [phone] or toll free [phone].  After clinic hours [phone] to have operator reach doctor.  Bring all of your medication bottles to all your appointments in the pain clinic.  To cancel or reschedule your appointment with Pain Management please remember to call 24 hours in advance to avoid a  fee.  Refer to the educational materials which you have been given on: General Risks, I had my Procedure. Discharge Instructions, Post Sedation.  Post Procedure Instructions:  The drugs you were given will stay in your system until tomorrow, so for the next 24 hours you should not drive, make any legal decisions or drink any alcoholic beverages.  You may eat anything you prefer, but it is better to start with liquids then soups and crackers, and gradually work up to solid foods.  Please notify your doctor immediately if you have any unusual bleeding, trouble breathing or  pain that is not related to your normal pain.  Depending on the type of procedure that was done, some parts of your body may feel week and/or numb.  This usually clears up by tonight or the next day.  Walk with the use of an assistive device or accompanied by an adult for the 24 hours.  You may use ice on the affected area for the first 24 hours.  Put ice in a Ziploc bag and cover with a towel and place against area 15 minutes on 15 minutes off.  You may switch to heat after 24 hours.GENERAL RISKS AND COMPLICATIONS  What are the risk, side effects and possible complications? Generally speaking, most procedures are safe.  However, with any procedure there are risks, side effects, and the possibility of complications.  The risks and complications are dependent upon the sites that are lesioned, or the type of nerve block to be performed.  The closer the procedure is to the spine, the more serious the risks are.  Great care is taken when placing the radio frequency needles, block needles or lesioning probes, but sometimes complications can occur. 1. Infection: Any time there is an injection through the skin, there is a risk of infection.  This is why sterile conditions are used for these blocks.  There are four possible types of infection. 1. Localized skin infection. 2. Central Nervous System Infection-This can be in the form of  Meningitis, which can be deadly. 3. Epidural Infections-This can be in the form of an epidural abscess, which can cause pressure inside of the spine, causing compression of the spinal cord with subsequent paralysis. This would require an emergency surgery to decompress, and there are no guarantees that the patient would recover from the paralysis. 4. Discitis-This is an infection of the intervertebral discs.  It occurs in about 1% of discography procedures.  It is difficult to treat and it may lead to surgery.        2. Pain: the needles have to go through skin and soft tissues, will cause soreness.       3. Damage to internal structures:  The nerves to be lesioned may be near blood vessels or    other nerves which can be potentially damaged.       4. Bleeding: Bleeding is more common if the patient is taking blood thinners such as  aspirin, Coumadin, Ticiid, Plavix, etc., or if he/she have some genetic predisposition  such as hemophilia. Bleeding into the spinal canal can cause compression of the spinal  cord with subsequent paralysis.  This would require an emergency surgery to  decompress and there are no guarantees that the patient would recover from the  paralysis.       5. Pneumothorax:  Puncturing of a lung is a possibility, every time a needle is introduced in  the area of the chest or upper back.  Pneumothorax refers to free air around the  collapsed lung(s), inside of the thoracic cavity (chest cavity).  Another two possible  complications related to a similar event would include: Hemothorax and Chylothorax.   These are variations of the Pneumothorax, where instead of air around the collapsed  lung(s), you may have blood or chyle, respectively.       6. Spinal headaches: They may occur with any procedures in the area of the spine.       7. Persistent CSF (Cerebro-Spinal Fluid) leakage: This is a rare problem, but may occur  with prolonged intrathecal  or epidural catheters either due to the  formation of a fistulous  track or a dural tear.       8. Nerve damage: By working so close to the spinal cord, there is always a possibility of  nerve damage, which could be as serious as a permanent spinal cord injury with  paralysis.       9. Death:  Although rare, severe deadly allergic reactions known as "Anaphylactic  reaction" can occur to any of the medications used.      10. Worsening of the symptoms:  We can always make thing worse.  What are the chances of something like this happening? Chances of any of this occuring are extremely low.  By statistics, you have more of a chance of getting killed in a motor vehicle accident: while driving to the hospital than any of the above occurring .  Nevertheless, you should be aware that they are possibilities.  In general, it is similar to taking a shower.  Everybody knows that you can slip, hit your head and get killed.  Does that mean that you should not shower again?  Nevertheless always keep in mind that statistics do not mean anything if you happen to be on the wrong side of them.  Even if a procedure has a 1 (one) in a 1,000,000 (million) chance of going wrong, it you happen to be that one..Also, keep in mind that by statistics, you have more of a chance of having something go wrong when taking medications.  Who should not have this procedure? If you are on a blood thinning medication (e.g. Coumadin, Plavix, see list of "Blood Thinners"), or if you have an active infection going on, you should not have the procedure.  If you are taking any blood thinners, please inform your physician.  How should I prepare for this procedure?  Do not eat or drink anything at least six hours prior to the procedure.  Bring a driver with you .  It cannot be a taxi.  Come accompanied by an adult that can drive you back, and that is strong enough to help you if your legs get weak or numb from the local anesthetic.  Take all of your medicines the morning of the  procedure with just enough water to swallow them.  If you have diabetes, make sure that you are scheduled to have your procedure done first thing in the morning, whenever possible.  If you have diabetes, take only half of your insulin dose and notify our nurse that you have done so as soon as you arrive at the clinic.  If you are diabetic, but only take blood sugar pills (oral hypoglycemic), then do not take them on the morning of your procedure.  You may take them after you have had the procedure.  Do not take aspirin or any aspirin-containing medications, at least eleven (11) days prior to the procedure.  They may prolong bleeding.  Wear loose fitting clothing that may be easy to take off and that you would not mind if it got stained with Betadine or blood.  Do not wear any jewelry or perfume  Remove any nail coloring.  It will interfere with some of our monitoring equipment.  NOTE: Remember that this is not meant to be interpreted as a complete list of all possible complications.  Unforeseen problems may occur.  BLOOD THINNERS The following drugs contain aspirin or other products, which can cause increased bleeding during surgery and should not  be taken for 2 weeks prior to and 1 week after surgery.  If you should need take something for relief of minor pain, you may take acetaminophen which is found in Tylenol,m Datril, Anacin-3 and Panadol. It is not blood thinner. The products listed below are.  Do not take any of the products listed below in addition to any listed on your instruction sheet.  A.P.C or A.P.C with Codeine Codeine Phosphate Capsules #3 Ibuprofen Ridaura  ABC compound Congesprin Imuran rimadil  Advil Cope Indocin Robaxisal  Alka-Seltzer Effervescent Pain Reliever and Antacid Coricidin or Coricidin-D  Indomethacin Rufen  Alka-Seltzer plus Cold Medicine Cosprin Ketoprofen S-A-C Tablets  Anacin Analgesic Tablets or Capsules Coumadin Korlgesic Salflex  Anacin Extra Strength  Analgesic tablets or capsules CP-2 Tablets Lanoril Salicylate  Anaprox Cuprimine Capsules Levenox Salocol  Anexsia-D Dalteparin Magan Salsalate  Anodynos Darvon compound Magnesium Salicylate Sine-off  Ansaid Dasin Capsules Magsal Sodium Salicylate  Anturane Depen Capsules Marnal Soma  APF Arthritis pain formula Dewitt's Pills Measurin Stanback  Argesic Dia-Gesic Meclofenamic Sulfinpyrazone  Arthritis Bayer Timed Release Aspirin Diclofenac Meclomen Sulindac  Arthritis pain formula Anacin Dicumarol Medipren Supac  Analgesic (Safety coated) Arthralgen Diffunasal Mefanamic Suprofen  Arthritis Strength Bufferin Dihydrocodeine Mepro Compound Suprol  Arthropan liquid Dopirydamole Methcarbomol with Aspirin Synalgos  ASA tablets/Enseals Disalcid Micrainin Tagament  Ascriptin Doan's Midol Talwin  Ascriptin A/D Dolene Mobidin Tanderil  Ascriptin Extra Strength Dolobid Moblgesic Ticlid  Ascriptin with Codeine Doloprin or Doloprin with Codeine Momentum Tolectin  Asperbuf Duoprin Mono-gesic Trendar  Aspergum Duradyne Motrin or Motrin IB Triminicin  Aspirin plain, buffered or enteric coated Durasal Myochrisine Trigesic  Aspirin Suppositories Easprin Nalfon Trillsate  Aspirin with Codeine Ecotrin Regular or Extra Strength Naprosyn Uracel  Atromid-S Efficin Naproxen Ursinus  Auranofin Capsules Elmiron Neocylate Vanquish  Axotal Emagrin Norgesic Verin  Azathioprine Empirin or Empirin with Codeine Normiflo Vitamin E  Azolid Emprazil Nuprin Voltaren  Bayer Aspirin plain, buffered or children's or timed BC Tablets or powders Encaprin Orgaran Warfarin Sodium  Buff-a-Comp Enoxaparin Orudis Zorpin  Buff-a-Comp with Codeine Equegesic Os-Cal-Gesic   Buffaprin Excedrin plain, buffered or Extra Strength Oxalid   Bufferin Arthritis Strength Feldene Oxphenbutazone   Bufferin plain or Extra Strength Feldene Capsules Oxycodone with Aspirin   Bufferin with Codeine Fenoprofen Fenoprofen Pabalate or Pabalate-SF    Buffets II Flogesic Panagesic   Buffinol plain or Extra Strength Florinal or Florinal with Codeine Panwarfarin   Buf-Tabs Flurbiprofen Penicillamine   Butalbital Compound Four-way cold tablets Penicillin   Butazolidin Fragmin Pepto-Bismol   Carbenicillin Geminisyn Percodan   Carna Arthritis Reliever Geopen Persantine   Carprofen Gold's salt Persistin   Chloramphenicol Goody's Phenylbutazone   Chloromycetin Haltrain Piroxlcam   Clmetidine heparin Plaquenil   Cllnoril Hyco-pap Ponstel   Clofibrate Hydroxy chloroquine Propoxyphen         Before stopping any of these medications, be sure to consult the physician who ordered them.  Some, such as Coumadin (Warfarin) are ordered to prevent or treat serious conditions such as "deep thrombosis", "pumonary embolisms", and other heart problems.  The amount of time that you may need off of the medication may also vary with the medication and the reason for which you were taking it.  If you are taking any of these medications, please make sure you notify your pain physician before you undergo any procedures.         Radiofrequency Lesioning Radiofrequency lesioning is a procedure that is performed to relieve pain. The procedure is often used for back, neck,  or arm pain. Radiofrequency lesioning involves the use of a machine that creates radio waves to make heat. During the procedure, the heat is applied to the nerve that carries the pain signal. The heat damages the nerve and interferes with the pain signal. Pain relief usually starts about 2 weeks after the procedure and lasts for 6 months to 1 year. Tell a health care provider about:  Any allergies you have.  All medicines you are taking, including vitamins, herbs, eye drops, creams, and over-the-counter medicines.  Any problems you or family members have had with anesthetic medicines.  Any blood disorders you have.  Any surgeries you have had.  Any medical conditions you  have.  Whether you are pregnant or may be pregnant. What are the risks? Generally, this is a safe procedure. However, problems may occur, including:  Pain or soreness at the injection site.  Infection at the injection site.  Damage to nerves or blood vessels.  What happens before the procedure?  Ask your health care provider about: ? Changing or stopping your regular medicines. This is especially important if you are taking diabetes medicines or blood thinners. ? Taking medicines such as aspirin and ibuprofen. These medicines can thin your blood. Do not take these medicines before your procedure if your health care provider instructs you not to.  Follow instructions from your health care provider about eating or drinking restrictions.  Plan to have someone take you home after the procedure.  If you go home right after the procedure, plan to have someone with you for 24 hours. What happens during the procedure?  You will be given one or more of the following: ? A medicine to help you relax (sedative). ? A medicine to numb the area (local anesthetic).  You will be awake during the procedure. You will need to be able to talk with the health care provider during the procedure.  With the help of a type of X-ray (fluoroscopy), the health care provider will insert a radiofrequency needle into the area to be treated.  Next, a wire that carries the radio waves (electrode) will be put through the radiofrequency needle. An electrical pulse will be sent through the electrode to verify the correct nerve. You will feel a tingling sensation, and you may have muscle twitching.  Then, the tissue that is around the needle tip will be heated by an electric current that is passed using the radiofrequency machine. This will numb the nerves.  A bandage (dressing) will be put on the insertion area after the procedure is done. The procedure may vary among health care providers and hospitals. What  happens after the procedure?  Your blood pressure, heart rate, breathing rate, and blood oxygen level will be monitored often until the medicines you were given have worn off.  Return to your normal activities as directed by your health care provider. This information is not intended to replace advice given to you by your health care provider. Make sure you discuss any questions you have with your health care provider. Document Released: 03/03/2011 Document Revised: 12/11/2015 Document Reviewed: 08/12/2014 [employer] Interactive Patient Education  [employer].

## 2017-09-01 NOTE — Patient Instructions (Addendum)
____________________________________________________________________________________________  Medication Rules  Applies to: All patients receiving prescriptions (written or electronic).  Pharmacy of record: Pharmacy where electronic prescriptions will be sent. If written prescriptions are taken to a different pharmacy, please inform the nursing staff. The pharmacy listed in the electronic medical record should be the one where you would like electronic prescriptions to be sent.  Prescription refills: Only during scheduled appointments. Applies to both, written and electronic prescriptions.  NOTE: The following applies primarily to controlled substances (Opioid* Pain Medications).   Patient's responsibilities: 1. Pain Pills: Bring all pain pills to every appointment (except for procedure appointments). 2. Pill Bottles: Bring pills in original pharmacy bottle. Always bring newest bottle. Bring bottle, even if empty. 3. Medication refills: You are responsible for knowing and keeping track of what medications you need refilled. The day before your appointment, write a list of all prescriptions that need to be refilled. Bring that list to your appointment and give it to the admitting nurse. Prescriptions will be written only during appointments. If you forget a medication, it will not be "Called in", "Faxed", or "electronically sent". You will need to get another appointment to get these prescribed. 4. Prescription Accuracy: You are responsible for carefully inspecting your prescriptions before leaving our office. Have the discharge nurse carefully go over each prescription with you, before taking them home. Make sure that your name is accurately spelled, that your address is correct. Check the name and dose of your medication to make sure it is accurate. Check the number of pills, and the written instructions to make sure they are clear and accurate. Make sure that you are given enough medication to  last until your next medication refill appointment. 5. Taking Medication: Take medication as prescribed. Never take more pills than instructed. Never take medication more frequently than prescribed. Taking less pills or less frequently is permitted and encouraged, when it comes to controlled substances (written prescriptions).  6. Inform other Doctors: Always inform, all of your healthcare providers, of all the medications you take. 7. Pain Medication from other Providers: You are not allowed to accept any additional pain medication from any other Doctor or Healthcare provider. There are two exceptions to this rule. (see below) In the event that you require additional pain medication, you are responsible for notifying us, as stated below. 8. Medication Agreement: You are responsible for carefully reading and following our Medication Agreement. This must be signed before receiving any prescriptions from our practice. Safely store a copy of your signed Agreement. Violations to the Agreement will result in no further prescriptions. (Additional copies of our Medication Agreement are available upon request.) 9. Laws, Rules, & Regulations: All patients are expected to follow all Federal and State Laws, Statutes, Rules, & Regulations. Ignorance of the Laws does not constitute a valid excuse. The use of any illegal substances is prohibited. 10. Adopted CDC guidelines & recommendations: Target dosing levels will be at or below 60 MME/day. Use of benzodiazepines** is not recommended.  Exceptions: There are only two exceptions to the rule of not receiving pain medications from other Healthcare Providers. 1. Exception #1 (Emergencies): In the event of an emergency (i.e.: accident requiring emergency care), you are allowed to receive additional pain medication. However, you are responsible for: As soon as you are able, call our office [phone], at any time of the day or night, and leave a message stating your  name, the date and nature of the emergency, and the name and dose of the medication   prescribed. In the event that your call is answered by a member of our staff, make sure to document and save the date, time, and the name of the person that took your information.  2. Exception #2 (Planned Surgery): In the event that you are scheduled by another doctor or dentist to have any type of surgery or procedure, you are allowed (for a period no longer than 30 days), to receive additional pain medication, for the acute post-op pain. However, in this case, you are responsible for picking up a copy of our "Post-op Pain Management for Surgeons" handout, and giving it to your surgeon or dentist. This document is available at our office, and does not require an appointment to obtain it. Simply go to our office during business hours (Monday-Thursday from 8:00 AM to 4:00 PM) (Friday 8:00 AM to 12:00 Noon) or if you have a scheduled appointment with [place], prior to your surgery, and ask for it by name. In addition, you will need to provide [place] with your name, name of your surgeon, type of surgery, and date of procedure or surgery.  *Opioid medications include: morphine, codeine, oxycodone, oxymorphone, hydrocodone, hydromorphone, meperidine, tramadol, tapentadol, buprenorphine, fentanyl, methadone. **Benzodiazepine medications include: diazepam (Valium), alprazolam (Xanax), clonazepam (Klonopine), lorazepam (Ativan), clorazepate (Tranxene), chlordiazepoxide (Librium), estazolam (Prosom), oxazepam (Serax), temazepam (Restoril), triazolam (Halcion)  ____________________________________________________________________________________________  Pain Management Discharge Instructions  General Discharge Instructions :  If you need to reach your doctor call: Monday-Friday 8:00 am - 4:00 pm at [phone] or toll free [phone].  After clinic hours [phone] to have operator reach doctor.  Bring all of your medication  bottles to all your appointments in the pain clinic.  To cancel or reschedule your appointment with Pain Management please remember to call 24 hours in advance to avoid a fee.  Refer to the educational materials which you have been given on: General Risks, I had my Procedure. Discharge Instructions, Post Sedation.  Post Procedure Instructions:  The drugs you were given will stay in your system until tomorrow, so for the next 24 hours you should not drive, make any legal decisions or drink any alcoholic beverages.  You may eat anything you prefer, but it is better to start with liquids then soups and crackers, and gradually work up to solid foods.  Please notify your doctor immediately if you have any unusual bleeding, trouble breathing or pain that is not related to your normal pain.  Depending on the type of procedure that was done, some parts of your body may feel week and/or numb.  This usually clears up by tonight or the next day.  Walk with the use of an assistive device or accompanied by an adult for the 24 hours.  You may use ice on the affected area for the first 24 hours.  Put ice in a Ziploc bag and cover with a towel and place against area 15 minutes on 15 minutes off.  You may switch to heat after 24 hours.GENERAL RISKS AND COMPLICATIONS  What are the risk, side effects and possible complications? Generally speaking, most procedures are safe.  However, with any procedure there are risks, side effects, and the possibility of complications.  The risks and complications are dependent upon the sites that are lesioned, or the type of nerve block to be performed.  The closer the procedure is to the spine, the more serious the risks are.  Great care is taken when placing the radio frequency needles, block needles or lesioning probes, but sometimes  complications can occur. 1. Infection: Any time there is an injection through the skin, there is a risk of infection.  This is why sterile  conditions are used for these blocks.  There are four possible types of infection. 1. Localized skin infection. 2. Central Nervous System Infection-This can be in the form of Meningitis, which can be deadly. 3. Epidural Infections-This can be in the form of an epidural abscess, which can cause pressure inside of the spine, causing compression of the spinal cord with subsequent paralysis. This would require an emergency surgery to decompress, and there are no guarantees that the patient would recover from the paralysis. 4. Discitis-This is an infection of the intervertebral discs.  It occurs in about 1% of discography procedures.  It is difficult to treat and it may lead to surgery.        2. Pain: the needles have to go through skin and soft tissues, will cause soreness.       3. Damage to internal structures:  The nerves to be lesioned may be near blood vessels or    other nerves which can be potentially damaged.       4. Bleeding: Bleeding is more common if the patient is taking blood thinners such as  aspirin, Coumadin, Ticiid, Plavix, etc., or if he/she have some genetic predisposition  such as hemophilia. Bleeding into the spinal canal can cause compression of the spinal  cord with subsequent paralysis.  This would require an emergency surgery to  decompress and there are no guarantees that the patient would recover from the  paralysis.       5. Pneumothorax:  Puncturing of a lung is a possibility, every time a needle is introduced in  the area of the chest or upper back.  Pneumothorax refers to free air around the  collapsed lung(s), inside of the thoracic cavity (chest cavity).  Another two possible  complications related to a similar event would include: Hemothorax and Chylothorax.   These are variations of the Pneumothorax, where instead of air around the collapsed  lung(s), you may have blood or chyle, respectively.       6. Spinal headaches: They may occur with any procedures in the area of the  spine.       7. Persistent CSF (Cerebro-Spinal Fluid) leakage: This is a rare problem, but may occur  with prolonged intrathecal or epidural catheters either due to the formation of a fistulous  track or a dural tear.       8. Nerve damage: By working so close to the spinal cord, there is always a possibility of  nerve damage, which could be as serious as a permanent spinal cord injury with  paralysis.       9. Death:  Although rare, severe deadly allergic reactions known as "Anaphylactic  reaction" can occur to any of the medications used.      10. Worsening of the symptoms:  We can always make thing worse.  What are the chances of something like this happening? Chances of any of this occuring are extremely low.  By statistics, you have more of a chance of getting killed in a motor vehicle accident: while driving to the hospital than any of the above occurring .  Nevertheless, you should be aware that they are possibilities.  In general, it is similar to taking a shower.  Everybody knows that you can slip, hit your head and get killed.  Does that mean that you should not  shower again?  Nevertheless always keep in mind that statistics do not mean anything if you happen to be on the wrong side of them.  Even if a procedure has a 1 (one) in a 1,000,000 (million) chance of going wrong, it you happen to be that one..Also, keep in mind that by statistics, you have more of a chance of having something go wrong when taking medications.  Who should not have this procedure? If you are on a blood thinning medication (e.g. Coumadin, Plavix, see list of "Blood Thinners"), or if you have an active infection going on, you should not have the procedure.  If you are taking any blood thinners, please inform your physician.  How should I prepare for this procedure?  Do not eat or drink anything at least six hours prior to the procedure.  Bring a driver with you .  It cannot be a taxi.  Come accompanied by an adult  that can drive you back, and that is strong enough to help you if your legs get weak or numb from the local anesthetic.  Take all of your medicines the morning of the procedure with just enough water to swallow them.  If you have diabetes, make sure that you are scheduled to have your procedure done first thing in the morning, whenever possible.  If you have diabetes, take only half of your insulin dose and notify our nurse that you have done so as soon as you arrive at the clinic.  If you are diabetic, but only take blood sugar pills (oral hypoglycemic), then do not take them on the morning of your procedure.  You may take them after you have had the procedure.  Do not take aspirin or any aspirin-containing medications, at least eleven (11) days prior to the procedure.  They may prolong bleeding.  Wear loose fitting clothing that may be easy to take off and that you would not mind if it got stained with Betadine or blood.  Do not wear any jewelry or perfume  Remove any nail coloring.  It will interfere with some of our monitoring equipment.  NOTE: Remember that this is not meant to be interpreted as a complete list of all possible complications.  Unforeseen problems may occur.  BLOOD THINNERS The following drugs contain aspirin or other products, which can cause increased bleeding during surgery and should not be taken for 2 weeks prior to and 1 week after surgery.  If you should need take something for relief of minor pain, you may take acetaminophen which is found in Tylenol,m Datril, Anacin-3 and Panadol. It is not blood thinner. The products listed below are.  Do not take any of the products listed below in addition to any listed on your instruction sheet.  A.P.C or A.P.C with Codeine Codeine Phosphate Capsules #3 Ibuprofen Ridaura  ABC compound Congesprin Imuran rimadil  Advil Cope Indocin Robaxisal  Alka-Seltzer Effervescent Pain Reliever and Antacid Coricidin or Coricidin-D   Indomethacin Rufen  Alka-Seltzer plus Cold Medicine Cosprin Ketoprofen S-A-C Tablets  Anacin Analgesic Tablets or Capsules Coumadin Korlgesic Salflex  Anacin Extra Strength Analgesic tablets or capsules CP-2 Tablets Lanoril Salicylate  Anaprox Cuprimine Capsules Levenox Salocol  Anexsia-D Dalteparin Magan Salsalate  Anodynos Darvon compound Magnesium Salicylate Sine-off  Ansaid Dasin Capsules Magsal Sodium Salicylate  Anturane Depen Capsules Marnal Soma  APF Arthritis pain formula Dewitt's Pills Measurin Stanback  Argesic Dia-Gesic Meclofenamic Sulfinpyrazone  Arthritis Bayer Timed Release Aspirin Diclofenac Meclomen Sulindac  Arthritis pain formula Anacin  Dicumarol Medipren Supac  Analgesic (Safety coated) Arthralgen Diffunasal Mefanamic Suprofen  Arthritis Strength Bufferin Dihydrocodeine Mepro Compound Suprol  Arthropan liquid Dopirydamole Methcarbomol with Aspirin Synalgos  ASA tablets/Enseals Disalcid Micrainin Tagament  Ascriptin Doan's Midol Talwin  Ascriptin A/D Dolene Mobidin Tanderil  Ascriptin Extra Strength Dolobid Moblgesic Ticlid  Ascriptin with Codeine Doloprin or Doloprin with Codeine Momentum Tolectin  Asperbuf Duoprin Mono-gesic Trendar  Aspergum Duradyne Motrin or Motrin IB Triminicin  Aspirin plain, buffered or enteric coated Durasal Myochrisine Trigesic  Aspirin Suppositories Easprin Nalfon Trillsate  Aspirin with Codeine Ecotrin Regular or Extra Strength Naprosyn Uracel  Atromid-S Efficin Naproxen Ursinus  Auranofin Capsules Elmiron Neocylate Vanquish  Axotal Emagrin Norgesic Verin  Azathioprine Empirin or Empirin with Codeine Normiflo Vitamin E  Azolid Emprazil Nuprin Voltaren  Bayer Aspirin plain, buffered or children's or timed BC Tablets or powders Encaprin Orgaran Warfarin Sodium  Buff-a-Comp Enoxaparin Orudis Zorpin  Buff-a-Comp with Codeine Equegesic Os-Cal-Gesic   Buffaprin Excedrin plain, buffered or Extra Strength Oxalid   Bufferin Arthritis Strength  Feldene Oxphenbutazone   Bufferin plain or Extra Strength Feldene Capsules Oxycodone with Aspirin   Bufferin with Codeine Fenoprofen Fenoprofen Pabalate or Pabalate-SF   Buffets II Flogesic Panagesic   Buffinol plain or Extra Strength Florinal or Florinal with Codeine Panwarfarin   Buf-Tabs Flurbiprofen Penicillamine   Butalbital Compound Four-way cold tablets Penicillin   Butazolidin Fragmin Pepto-Bismol   Carbenicillin Geminisyn Percodan   Carna Arthritis Reliever Geopen Persantine   Carprofen Gold's salt Persistin   Chloramphenicol Goody's Phenylbutazone   Chloromycetin Haltrain Piroxlcam   Clmetidine heparin Plaquenil   Cllnoril Hyco-pap Ponstel   Clofibrate Hydroxy chloroquine Propoxyphen         Before stopping any of these medications, be sure to consult the physician who ordered them.  Some, such as Coumadin (Warfarin) are ordered to prevent or treat serious conditions such as "deep thrombosis", "pumonary embolisms", and other heart problems.  The amount of time that you may need off of the medication may also vary with the medication and the reason for which you were taking it.  If you are taking any of these medications, please make sure you notify your pain physician before you undergo any procedures.         Radiofrequency Lesioning Radiofrequency lesioning is a procedure that is performed to relieve pain. The procedure is often used for back, neck, or arm pain. Radiofrequency lesioning involves the use of a machine that creates radio waves to make heat. During the procedure, the heat is applied to the nerve that carries the pain signal. The heat damages the nerve and interferes with the pain signal. Pain relief usually starts about 2 weeks after the procedure and lasts for 6 months to 1 year. Tell a health care provider about:  Any allergies you have.  All medicines you are taking, including vitamins, herbs, eye drops, creams, and over-the-counter medicines.  Any  problems you or family members have had with anesthetic medicines.  Any blood disorders you have.  Any surgeries you have had.  Any medical conditions you have.  Whether you are pregnant or may be pregnant. What are the risks? Generally, this is a safe procedure. However, problems may occur, including:  Pain or soreness at the injection site.  Infection at the injection site.  Damage to nerves or blood vessels.  What happens before the procedure?  Ask your health care provider about: ? Changing or stopping your regular medicines. This is especially important if you are taking  diabetes medicines or blood thinners. ? Taking medicines such as aspirin and ibuprofen. These medicines can thin your blood. Do not take these medicines before your procedure if your health care provider instructs you not to.  Follow instructions from your health care provider about eating or drinking restrictions.  Plan to have someone take you home after the procedure.  If you go home right after the procedure, plan to have someone with you for 24 hours. What happens during the procedure?  You will be given one or more of the following: ? A medicine to help you relax (sedative). ? A medicine to numb the area (local anesthetic).  You will be awake during the procedure. You will need to be able to talk with the health care provider during the procedure.  With the help of a type of X-ray (fluoroscopy), the health care provider will insert a radiofrequency needle into the area to be treated.  Next, a wire that carries the radio waves (electrode) will be put through the radiofrequency needle. An electrical pulse will be sent through the electrode to verify the correct nerve. You will feel a tingling sensation, and you may have muscle twitching.  Then, the tissue that is around the needle tip will be heated by an electric current that is passed using the radiofrequency machine. This will numb the  nerves.  A bandage (dressing) will be put on the insertion area after the procedure is done. The procedure may vary among health care providers and hospitals. What happens after the procedure?  Your blood pressure, heart rate, breathing rate, and blood oxygen level will be monitored often until the medicines you were given have worn off.  Return to your normal activities as directed by your health care provider. This information is not intended to replace advice given to you by your health care provider. Make sure you discuss any questions you have with your health care provider. Document Released: 03/03/2011 Document Revised: 12/11/2015 Document Reviewed: 08/12/2014 Elsevier Interactive Patient Education  [employer].

## 2017-09-17 ENCOUNTER — Emergency Department (HOSPITAL_COMMUNITY): Payer: BLUE CROSS/BLUE SHIELD

## 2017-09-17 ENCOUNTER — Encounter (HOSPITAL_COMMUNITY): Payer: Self-pay

## 2017-09-17 ENCOUNTER — Emergency Department (HOSPITAL_COMMUNITY)
Admission: EM | Admit: 2017-09-17 | Discharge: 2017-09-17 | Disposition: A | Discharge status: Home / Self Care | Payer: BLUE CROSS/BLUE SHIELD | Attending: Emergency Medicine | Admitting: Emergency Medicine

## 2017-09-17 DIAGNOSIS — Z79899 Other long term (current) drug therapy: Secondary | ICD-10-CM | POA: Insufficient documentation

## 2017-09-17 DIAGNOSIS — F17210 Nicotine dependence, cigarettes, uncomplicated: Secondary | ICD-10-CM | POA: Insufficient documentation

## 2017-09-17 DIAGNOSIS — S161XXA Strain of muscle, fascia and tendon at neck level, initial encounter: Secondary | ICD-10-CM | POA: Insufficient documentation

## 2017-09-17 DIAGNOSIS — Y939 Activity, unspecified: Secondary | ICD-10-CM | POA: Insufficient documentation

## 2017-09-17 DIAGNOSIS — Y999 Unspecified external cause status: Secondary | ICD-10-CM | POA: Diagnosis not present

## 2017-09-17 DIAGNOSIS — Y929 Unspecified place or not applicable: Secondary | ICD-10-CM | POA: Diagnosis not present

## 2017-09-17 DIAGNOSIS — S199XXA Unspecified injury of neck, initial encounter: Secondary | ICD-10-CM | POA: Diagnosis present
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
## 2017-10-10 DIAGNOSIS — M5388 Other specified dorsopathies, sacral and sacrococcygeal region: Secondary | ICD-10-CM | POA: Insufficient documentation

## 2017-10-10 NOTE — Progress Notes (Signed)
Patient's Name: Nathan Lambert.  MRN: [id number]  Referring Provider: Smith Robert, MD  DOB: 1981-01-31  PCP: Nathan Robert, MD  DOS: 10/11/2017  Note by: Nathan Done, MD  Service setting: Ambulatory outpatient  Specialty: Interventional Pain Management  Patient type: Established  Location: [facility] (AMB) Pain Management Facility  Visit type: Interventional Procedure   Primary Reason for Visit: Interventional Pain Management Treatment. CC: Back Pain (lower)  Procedure:       Anesthesia, Analgesia, Anxiolysis:  Type: Thermal Lumbar Facet, Medial Branch & Sacroiliac joint Radiofrequency Ablation Region: Lumbosacral Level: L2, L3, L4, L5, S1, S2, & S3 Medial Branch Level(s). These levels will denervate the L3-4, L4-5, and the L5-S1 lumbar facet joints, as well as the posterior Sacroiliac Joint innervation. Primary Purpose: Therapeutic Region: Posterolateral Lumbosacral Spine Laterality: Right  Type: Moderate (Conscious) Sedation combined with Local Anesthesia Indication(s): Analgesia and Anxiety Route: Intravenous (IV) IV Access: Secured Sedation: Meaningful verbal contact was maintained at all times during the procedure  Local Anesthetic: Lidocaine 1-2%   Indications: 1. Spondylosis without myelopathy or radiculopathy, lumbar region   2. Lumbar facet syndrome (Bilateral) (R>L)   3. Other specified dorsopathies, sacral and sacrococcygeal region   4. Chronic sacroiliac joint pain (Bilateral) (R>L)   5. Chronic low back pain (Primary Area of Pain) (Bilateral) (R>L)    Nathan Lambert has been dealing with the above chronic pain for longer than three months and has either failed to respond, was unable to tolerate, or simply did not get enough benefit from other more conservative therapies including, but not limited to: 1. Over-the-counter medications 2. Anti-inflammatory medications 3. Muscle relaxants 4. Membrane stabilizers 5. Opioids 6. Physical therapy 7. Modalities  (Heat, ice, etc.) 8. Invasive techniques such as nerve blocks. Nathan Lambert has attained more than 50% relief of the pain from a series of diagnostic injections conducted in separate occasions.  Pain Score: Pre-procedure: 2 /10 Post-procedure: 3 /10  Imaging Review  Lumbosacral Imaging: Lumbar MR wo contrast:  Results for orders placed during the hospital encounter of 11/30/16  MR LUMBAR SPINE WO CONTRAST   Narrative CLINICAL DATA:  37 year old male with chronic lumbar back pain radiating in both legs to the mid thigh. Prior surgery in 2016.  EXAM: MRI LUMBAR SPINE WITHOUT CONTRAST  TECHNIQUE: Multiplanar, multisequence MR imaging of the lumbar spine was performed. No intravenous contrast was administered.  COMPARISON:  [facility] Postoperative lumbar MRI 07/02/2015, and [facility] preoperative MRI 05/08/2015.  FINDINGS: Segmentation: Appears to be normal, which is the same designation used on the prior MRIs.  Alignment:  Stable.  Chronic straightening of lumbar lordosis.  Vertebrae: Resolved L5-S1 in plate marrow edema seen on the postoperative study. Chronic degenerative endplate marrow changes now at that level. Other visible bone marrow signal is within normal limits. No acute osseous abnormality identified.  Conus medullaris: Extends to the T12-L1 level and appears normal.  Paraspinal and other soft tissues: Negative visualized abdominal viscera. Very mild postoperative changes to the posterior paraspinal soft tissues at L5-S1.  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 Mild facet hypertrophy on the right. Borderline to mild right T11 foraminal stenosis.  T12-L1: Mild congenital spinal canal narrowing related to short pedicles.  L1-L2:  Negative.  L2-L3: Mild congenital spinal canal narrowing related short pedicles. Superimposed mild epidural  lipomatosis.  L3-L4: Mild congenital spinal canal narrowing related short pedicles and mild epidural lipomatosis. Mild mostly far lateral disc bulging. Mild  facet hypertrophy.  L4-L5: Chronic disc desiccation appears mildly increased. Circumferential disc bulge with broad-based posterior component has not significantly changed since 2016. Mild endplate spurring. Mildly increased facet and left ligament flavum hypertrophy. Mild bilateral L4 foraminal stenosis is stable.  L5-S1: Resected or regressed broad-based right lateral recess disc protrusion which was seen in December 2016. Chronic disc degeneration, circumferential disc bulge, and endplate degeneration with spurring. Resolved right lateral recess stenosis. No spinal stenosis. Moderate bilateral L5 foraminal stenosis has not significantly changed.  IMPRESSION: 1. Resolved right lateral recess stenosis at L5-S1. Chronic disc and endplate degeneration at that level with moderate bilateral L5 foraminal stenosis. 2. Chronic L4-L5 disc degeneration has not significantly changed and contributes to mild bilateral L4 foraminal stenosis. 3. 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   Electronically Signed   By: Nathan Lambert M.D.   On: 11/30/2016 16:06    Note: Imaging results reviewed. Multilevel lumbar facet hypertrophy with bilateral lumbar facet syndrome.  Positive reproduction of pain on hyperextension and rotation, bilaterally.  Positive reproduction of pain on Patrick maneuver, bilaterally.   Pre-op Assessment:  Nathan Lambert is a 37 y.o. (year old), male patient, seen today for interventional treatment. He  has a past surgical history that includes Fracture surgery (Left); Spine surgery (Right, 06/16/2015); and Back surgery. Nathan Lambert has a current medication list which includes the following prescription(s): gabapentin, loratadine, montelukast,  naloxone, oxycodone hcl, oxycodone hcl, testosterone cypionate, viagra, and hydrocodone-acetaminophen. His primarily concern today is the Back Pain (lower)  The initial bilateral lumbar facet block was Lambert without the inclusion of the SI joint.  On follow-up, the patient indicated having attained 50% relief of the pain.  A subsequent second bilateral lumbar facet block was then Lambert adding a diagnostic bilateral sacroiliac joint block, as the patient had proved to have no normally pain on hyperextension and rotation but also pain during the Patrick maneuver.  This second diagnostic block provided the patient with 100% relief of the pain for the duration of the local anesthetic indicating that there is a component of the pain coming from the lumbar facets and adenoid component coming from the sacroiliac joint.  In view of this, the plan is to do both, the SI joint and the lumbar facets.  Initial Vital Signs:  Pulse Rate: 72 Temp: 98.3 F (36.8 C) Resp: 16 BP: 124/83 SpO2: 99 %  BMI: Estimated body mass index is 37.66 kg/m as calculated from the following:   Height as of this encounter: 5\' 11"  (1.803 m).   Weight as of this encounter: 270 lb (122.5 kg).  Risk Assessment: Allergies: Reviewed. He is allergic to bactrim [sulfamethoxazole-trimethoprim].  Allergy Precautions: None required Coagulopathies: Reviewed. None identified.  Blood-thinner therapy: None at this time Active Infection(s): Reviewed. None identified. Mr. Delaguila is afebrile  Site Confirmation: Mr. Hannum was asked to confirm the procedure and laterality before marking the site Procedure checklist: Completed Consent: Before the procedure and under the influence of no sedative(s), amnesic(s), or anxiolytics, the patient was informed of the treatment options, risks and possible complications. To fulfill our ethical and legal obligations, as recommended by the American Medical Association's Code of Ethics, I have informed  the patient of my clinical impression; the nature and purpose of the treatment or procedure; the risks, benefits, and possible complications of the intervention; the alternatives, including doing nothing; the risk(s) and benefit(s) of the alternative treatment(s) or procedure(s); and the risk(s) and benefit(s) of  doing nothing. The patient was provided information about the general risks and possible complications associated with the procedure. These may include, but are not limited to: failure to achieve desired goals, infection, bleeding, organ or nerve damage, allergic reactions, paralysis, and death. In addition, the patient was informed of those risks and complications associated to Spine-related procedures, such as failure to decrease pain; infection (i.e.: Meningitis, epidural or intraspinal abscess); bleeding (i.e.: epidural hematoma, subarachnoid hemorrhage, or any other type of intraspinal or peri-dural bleeding); organ or nerve damage (i.e.: Any type of peripheral nerve, nerve root, or spinal cord injury) with subsequent damage to sensory, motor, and/or autonomic systems, resulting in permanent pain, numbness, and/or weakness of one or several areas of the body; allergic reactions; (i.e.: anaphylactic reaction); and/or death. Furthermore, the patient was informed of those risks and complications associated with the medications. These include, but are not limited to: allergic reactions (i.e.: anaphylactic or anaphylactoid reaction(s)); adrenal axis suppression; blood sugar elevation that in diabetics may result in ketoacidosis or comma; water retention that in patients with history of congestive heart failure may result in shortness of breath, pulmonary edema, and decompensation with resultant heart failure; weight gain; swelling or edema; medication-induced neural toxicity; particulate matter embolism and blood vessel occlusion with resultant organ, and/or nervous system infarction; and/or aseptic  necrosis of one or more joints. Finally, the patient was informed that Medicine is not an exact science; therefore, there is also the possibility of unforeseen or unpredictable risks and/or possible complications that may result in a catastrophic outcome. The patient indicated having understood very clearly. We have given the patient no guarantees and we have made no promises. Enough time was given to the patient to ask questions, all of which were answered to the patient's satisfaction. Mr. Rinker has indicated that he wanted to continue with the procedure. Attestation: I, the ordering provider, attest that I have discussed with the patient the benefits, risks, side-effects, alternatives, likelihood of achieving goals, and potential problems during recovery for the procedure that I have provided informed consent. Date  Time: 10/11/2017  2:08 PM  Pre-Procedure Preparation:  Monitoring: As per clinic protocol. Respiration, ETCO2, SpO2, BP, heart rate and rhythm monitor placed and checked for adequate function Safety Precautions: Patient was assessed for positional comfort and pressure points before starting the procedure. Time-out: I initiated and conducted the "Time-out" before starting the procedure, as per protocol. The patient was asked to participate by confirming the accuracy of the "Time Out" information. Verification of the correct person, site, and procedure were performed and confirmed by me, the nursing staff, and the patient. "Time-out" conducted as per Joint Commission's Universal Protocol (UP.01.01.01). Time: 1452  Description of Procedure:       Position: Prone Laterality: Right Level: L2, L3, L4, L5, S1, S2, & S3 Medial Branch Level(s), at the L3-4, L4-5, and the L5-S1 lumbar facet joints, as well as the posterior Sacroiliac Joint. Area Prepped: Lumbosacral Prepping solution: ChloraPrep (2% chlorhexidine gluconate and 70% isopropyl alcohol) Safety Precautions: Aspiration looking  for blood return was conducted prior to all injections. At no point did we inject any substances, as a needle was being advanced. Before injecting, the patient was told to immediately notify me if he was experiencing any new onset of "ringing in the ears, or metallic taste in the mouth". No attempts were made at seeking any paresthesias. Safe injection practices and needle disposal techniques used. Medications properly checked for expiration dates. SDV (single dose vial) medications used. After the completion  of the procedure, all disposable equipment used was discarded in the proper designated medical waste containers. Local Anesthesia: Protocol guidelines were followed. The patient was positioned over the fluoroscopy table. The area was prepped in the usual manner. The time-out was completed. The target area was identified using fluoroscopy. A 12-in long, straight, sterile hemostat was used with fluoroscopic guidance to locate the targets for each level blocked. Once located, the skin was marked with an approved surgical skin marker. Once all sites were marked, the skin (epidermis, dermis, and hypodermis), as well as deeper tissues (fat, connective tissue and muscle) were infiltrated with a small amount of a short-acting local anesthetic, loaded on a 10cc syringe with a 25G, 1.5-in  Needle. An appropriate amount of time was allowed for local anesthetics to take effect before proceeding to the next step. Local Anesthetic: Lidocaine 2.0% The unused portion of the local anesthetic was discarded in the proper designated containers. Technical explanation of process:  Radiofrequency Ablation (RFA) L2 Medial Branch Nerve RFA: The target area for the L2 medial branch is at the junction of the postero-lateral aspect of the superior articular process and the superior, posterior, and medial edge of the transverse process of L3. Under fluoroscopic guidance, a Radiofrequency needle was inserted until contact was made with  os over the superior postero-lateral aspect of the pedicular shadow (target area). Sensory and motor testing was conducted to properly adjust the position of the needle. Once satisfactory placement of the needle was achieved, the numbing solution was slowly injected after negative aspiration for blood. 2.0 mL of the nerve block solution was injected without difficulty or complication. After waiting for at least 3 minutes, the ablation was performed. Once completed, the needle was removed intact. L3 Medial Branch Nerve RFA: The target area for the L3 medial branch is at the junction of the postero-lateral aspect of the superior articular process and the superior, posterior, and medial edge of the transverse process of L4. Under fluoroscopic guidance, a Radiofrequency needle was inserted until contact was made with os over the superior postero-lateral aspect of the pedicular shadow (target area). Sensory and motor testing was conducted to properly adjust the position of the needle. Once satisfactory placement of the needle was achieved, the numbing solution was slowly injected after negative aspiration for blood. 2.0 mL of the nerve block solution was injected without difficulty or complication. After waiting for at least 3 minutes, the ablation was performed. Once completed, the needle was removed intact. L4 Medial Branch Nerve RFA: The target area for the L4 medial branch is at the junction of the postero-lateral aspect of the superior articular process and the superior, posterior, and medial edge of the transverse process of L5. Under fluoroscopic guidance, a Radiofrequency needle was inserted until contact was made with os over the superior postero-lateral aspect of the pedicular shadow (target area). Sensory and motor testing was conducted to properly adjust the position of the needle. Once satisfactory placement of the needle was achieved, the numbing solution was slowly injected after negative aspiration for  blood. 2.0 mL of the nerve block solution was injected without difficulty or complication. After waiting for at least 3 minutes, the ablation was performed. Once completed, the needle was removed intact. L5 Medial Branch Nerve RFA: The target area for the L5 medial branch is at the junction of the postero-lateral aspect of the superior articular process of S1 and the superior, posterior, and medial edge of the sacral ala. Under fluoroscopic guidance, a Radiofrequency needle was  inserted until contact was made with os over the superior postero-lateral aspect of the pedicular shadow (target area). Sensory and motor testing was conducted to properly adjust the position of the needle. Once satisfactory placement of the needle was achieved, the numbing solution was slowly injected after negative aspiration for blood. 2.0 mL of the nerve block solution was injected without difficulty or complication. After waiting for at least 3 minutes, the ablation was performed. Once completed, the needle was removed intact. S1 Primary Dorsal Rami and Lateral Branch Nerve RFA: The target area for the S1 medial branch is located inferior to the junction of the S1 superior articular process and the L5 inferior articular process, posterior, inferior, and lateral to the 6 o'clock position of the L5-S1 facet joint, just superior to the S1 posterior foramen. Under fluoroscopic guidance, the Radiofrequency needle was advanced until contact was made with os over the Target area. Sensory and motor testing was conducted to properly adjust the position of the needle. Once satisfactory placement of the needle was achieved, the numbing solution was slowly injected after negative aspiration for blood. 2.0 mL of the nerve block solution was injected without difficulty or complication. After waiting for at least 3 minutes, the ablation was performed. Once completed, the needle was removed intact. S2 Primary Dorsal Rami and Lateral Branch Nerve RFA:  The target area for the S2 medial branch is at the posterior superior lateral of the S2 posterior neural foramen. Under fluoroscopic guidance, the Radiofrequency needle was advanced until contact was made with os over the Target area. Sensory and motor testing was conducted to properly adjust the position of the needle. Once satisfactory placement of the needle was achieved, the numbing solution was slowly injected after negative aspiration for blood. 2.0 mL of the nerve block solution was injected without difficulty or complication. After waiting for at least 3 minutes, the ablation was performed. Once completed, the needle was removed intact. S3 Primary Dorsal Rami and Lateral Branch Nerve RFA: The target area for the S3 medial branch is at the posterior superior lateral of the S3 posterior neural foramen. Under fluoroscopic guidance, the Radiofrequency needle was advanced until contact was made with os over the Target area. Sensory and motor testing was conducted to properly adjust the position of the needle. Once satisfactory placement of the needle was achieved, the numbing solution was slowly injected after negative aspiration for blood. 2.0 mL of the nerve block solution was injected without difficulty or complication. After waiting for at least 3 minutes, the ablation was performed. Once completed, the needle was removed intact. Radiofrequency lesioning (ablation):  Radiofrequency Generator: NeuroTherm [id number] Sensory Stimulation Parameters: 50 Hz was used to locate & identify the nerve, making sure that the needle was positioned such that there was no sensory stimulation below 0.3 V or above 0.7 V. Motor Stimulation Parameters: 2 Hz was used to evaluate the motor component. Care was taken not to lesion any nerves that demonstrated motor stimulation of the lower extremities at an output of less than 2.5 times that of the sensory threshold, or a maximum of 2.0 V. Lesioning Technique Parameters: Standard  Radiofrequency settings. (Not bipolar or pulsed.) Temperature Settings: 80 degrees C Lesioning time: 60 seconds Intra-operative Compliance: Compliant Materials & Medications: Needle(s) (Electrode/Cannula) Type: Teflon-coated, curved tip, Radiofrequency needle(s) Gauge: 20G Length: 15cm Numbing solution: 0.2% PF-Ropivacaine + Triamcinolone (40 mg/mL) diluted to a final concentration of 4 mg of Triamcinolone/mL of Ropivacaine The unused portion of the solution was discarded in the  proper designated containers.  Once the entire procedure was completed, the treated area was cleaned, making sure to leave some of the prepping solution back to take advantage of its long term bactericidal properties.    Illustration of the posterior view of the lumbar spine and the posterior neural structures. Laminae of L2 through S1 are labeled. DPRL5, dorsal primary ramus of L5; DPRS1, dorsal primary ramus of S1; DPR3, dorsal primary ramus of L3; FJ, facet (zygapophyseal) joint L3-L4; I, inferior articular process of L4; LB1, lateral branch of dorsal primary ramus of L1; IAB, inferior articular branches from L3 medial branch (supplies L4-L5 facet joint); IBP, intermediate branch plexus; MB3, medial branch of dorsal primary ramus of L3; NR3, third lumbar nerve root; S, superior articular process of L5; SAB, superior articular branches from L4 (supplies L4-5 facet joint also); TP3, transverse process of L3.  Vitals:   10/11/17 1535 10/11/17 1545 10/11/17 1555 10/11/17 1605  BP: 110/76 118/72 124/75 115/87  Pulse:    70  Resp: 19 14 13 16   Temp:    (!) 97.3 F (36.3 C)  TempSrc:      SpO2: 100% 99% 100% 100%  Weight:      Height:        Start Time: 1452 hrs. End Time: 1535 hrs.  Imaging Guidance (Spinal):  Type of Imaging Technique: Fluoroscopy Guidance (Spinal) Indication(s): Assistance in needle guidance and placement for procedures requiring needle placement in or near specific anatomical locations not  easily accessible without such assistance. Exposure Time: Please see nurses notes. Contrast: None used. Fluoroscopic Guidance: I was personally present during the use of fluoroscopy. "Tunnel Vision Technique" used to obtain the best possible view of the target area. Parallax error corrected before commencing the procedure. "Direction-depth-direction" technique used to introduce the needle under continuous pulsed fluoroscopy. Once target was reached, antero-posterior, oblique, and lateral fluoroscopic projection used confirm needle placement in all planes. Images permanently stored in EMR. Interpretation: No contrast injected. I personally interpreted the imaging intraoperatively. Adequate needle placement confirmed in multiple planes. Permanent images saved into the patient's record.  Antibiotic Prophylaxis:   Anti-infectives (From admission, onward)   None     Indication(s): None identified  Post-operative Assessment:  Post-procedure Vital Signs:  Pulse Rate: 70 Temp: (!) 97.3 F (36.3 C) Resp: 16 BP: 115/87 SpO2: 100 %  EBL: None  Complications: No immediate post-treatment complications observed by team, or reported by patient.  Note: The patient tolerated the entire procedure well. A repeat set of vitals were taken after the procedure and the patient was kept under observation following institutional policy, for this type of procedure. Post-procedural neurological assessment was performed, showing return to baseline, prior to discharge. The patient was provided with post-procedure discharge instructions, including a section on how to identify potential problems. Should any problems arise concerning this procedure, the patient was given instructions to immediately contact us, at any time, without hesitation. In any case, we plan to contact the patient by telephone for a follow-up status report regarding this interventional procedure.  Comments:  No additional relevant  information.  Plan of Care    Imaging Orders     DG C-Arm 1-60 Min-No Report  Procedure Orders     Radiofrequency,Lumbar     Radiofrequency Sacroiliac Joint     Radiofrequency,Lumbar     Radiofrequency Sacroiliac Joint  Medications ordered for procedure: Meds ordered this encounter  Medications  . lidocaine (XYLOCAINE) 2 % (with pres) injection 400 mg  . midazolam (VERSED)  5 MG/5ML injection 1-2 mg    Make sure Flumazenil is available in the pyxis when using this medication. If oversedation occurs, administer 0.2 mg IV over 15 sec. If after 45 sec no response, administer 0.2 mg again over 1 min; may repeat at 1 min intervals; not to exceed 4 doses (1 mg)  . fentaNYL (SUBLIMAZE) injection 25-50 mcg    Make sure Narcan is available in the pyxis when using this medication. In the event of respiratory depression (RR< 8/min): Titrate NARCAN (naloxone) in increments of 0.1 to 0.2 mg IV at 2-3 minute intervals, until desired degree of reversal.  . lactated ringers infusion 1,000 mL  . ropivacaine (PF) 2 mg/mL (0.2%) (NAROPIN) injection 9 mL  . triamcinolone acetonide (KENALOG-40) injection 40 mg  . ropivacaine (PF) 2 mg/mL (0.2%) (NAROPIN) injection 9 mL  . triamcinolone acetonide (KENALOG-40) injection 40 mg  . gabapentin (NEURONTIN) 300 MG capsule    Sig: Take 1-3 capsules (300-900 mg total) by mouth 3 (three) times daily. Follow written titration schedule.    Dispense:  270 capsule    Refill:  1    Do not place medication on "Automatic Refill". Fill one day early if pharmacy is closed on scheduled refill date.  Marland Kitchen HYDROcodone-acetaminophen (NORCO/VICODIN) 5-325 MG tablet    Sig: Take 1 tablet by mouth every 6 (six) hours as needed for up to 7 days for moderate pain.    Dispense:  28 tablet    Refill:  0    For acute post-operative pain. Not to be refilled. To last 7 days.   Medications administered: We administered lidocaine, midazolam, fentaNYL, lactated ringers, ropivacaine (PF)  2 mg/mL (0.2%), triamcinolone acetonide, ropivacaine (PF) 2 mg/mL (0.2%), and triamcinolone acetonide.  See the medical record for exact dosing, route, and time of administration.  New Prescriptions   HYDROCODONE-ACETAMINOPHEN (NORCO/VICODIN) 5-325 MG TABLET    Take 1 tablet by mouth every 6 (six) hours as needed for up to 7 days for moderate pain.   Disposition: Discharge home  Discharge Date & Time: 10/11/2017; 1608 hrs.   Physician-requested Follow-up: Return for contralateral RFA (2 wks): (L) L-FCT + SI RFA.  Future Appointments  Date Time Provider Department Center  10/25/2017  9:45 AM Barbette Merino, NP [facility] None   Primary Care Physician: Nathan Robert, MD Location: [facility] Note by: Nathan Done, MD Date: 10/11/2017; Time: 4:04 PM  Disclaimer:  Medicine is not an Visual merchandiser. The only guarantee in medicine is that nothing is guaranteed. It is important to note that the decision to proceed with this intervention was based on the information collected from the patient. The Data and conclusions were drawn from the patient's questionnaire, the interview, and the physical examination. Because the information was provided in large part by the patient, it cannot be guaranteed that it has not been purposely or unconsciously manipulated. Every effort has been made to obtain as much relevant data as possible for this evaluation. It is important to note that the conclusions that lead to this procedure are derived in large part from the available data. Always take into account that the treatment will also be dependent on availability of resources and existing treatment guidelines, considered by other Pain Management Practitioners as being common knowledge and practice, at the time of the intervention. For Medico-Legal purposes, it is also important to point out that variation in procedural techniques and pharmacological choices are the acceptable norm. The  indications, contraindications, technique, and  results of the above procedure should only be interpreted and judged by a Board-Certified Interventional Pain Specialist with extensive familiarity and expertise in the same exact procedure and technique.

## 2017-10-11 ENCOUNTER — Ambulatory Visit (HOSPITAL_BASED_OUTPATIENT_CLINIC_OR_DEPARTMENT_OTHER): Payer: BLUE CROSS/BLUE SHIELD | Admitting: Pain Medicine

## 2017-10-11 ENCOUNTER — Ambulatory Visit
Admission: RE | Admit: 2017-10-11 | Discharge: 2017-10-11 | Disposition: A | Discharge status: Home / Self Care | Payer: BLUE CROSS/BLUE SHIELD | Source: Ambulatory Visit | Attending: Pain Medicine | Admitting: Pain Medicine

## 2017-10-11 ENCOUNTER — Encounter: Payer: Self-pay | Admitting: Pain Medicine

## 2017-10-11 ENCOUNTER — Other Ambulatory Visit: Payer: Self-pay

## 2017-10-11 VITALS — BP 115/87 | HR 70 | Temp 97.3°F | Resp 16 | Ht 71.0 in | Wt 270.0 lb

## 2017-10-11 DIAGNOSIS — M5442 Lumbago with sciatica, left side: Secondary | ICD-10-CM | POA: Insufficient documentation

## 2017-10-11 DIAGNOSIS — M5441 Lumbago with sciatica, right side: Secondary | ICD-10-CM | POA: Diagnosis not present

## 2017-10-11 DIAGNOSIS — M47816 Spondylosis without myelopathy or radiculopathy, lumbar region: Secondary | ICD-10-CM | POA: Diagnosis not present

## 2017-10-11 DIAGNOSIS — M533 Sacrococcygeal disorders, not elsewhere classified: Secondary | ICD-10-CM | POA: Insufficient documentation

## 2017-10-11 DIAGNOSIS — M545 Low back pain: Secondary | ICD-10-CM | POA: Diagnosis present

## 2017-10-11 DIAGNOSIS — G8918 Other acute postprocedural pain: Secondary | ICD-10-CM

## 2017-10-11 DIAGNOSIS — G8929 Other chronic pain: Secondary | ICD-10-CM | POA: Insufficient documentation

## 2017-10-11 DIAGNOSIS — G894 Chronic pain syndrome: Secondary | ICD-10-CM

## 2017-10-11 DIAGNOSIS — M5388 Other specified dorsopathies, sacral and sacrococcygeal region: Secondary | ICD-10-CM

## 2017-10-11 MED ORDER — ROPIVACAINE HCL 2 MG/ML IJ SOLN
9.0000 mL | Freq: Once | INTRAMUSCULAR | Status: AC
  Administered 2017-10-11: 10 mL via PERINEURAL
  Filled 2017-10-11: qty 10

## 2017-10-11 MED ORDER — HYDROCODONE-ACETAMINOPHEN 5-325 MG PO TABS: 1.0000 | ORAL_TABLET | Freq: Four times a day (QID) | ORAL | 0 refills | Status: DC | PRN

## 2017-10-11 MED ORDER — TRIAMCINOLONE ACETONIDE 40 MG/ML IJ SUSP
40.0000 mg | Freq: Once | INTRAMUSCULAR | Status: AC
  Administered 2017-10-11: 40 mg
  Filled 2017-10-11: qty 1

## 2017-10-11 MED ORDER — LACTATED RINGERS IV SOLN
1000.0000 mL | Freq: Once | INTRAVENOUS | Status: AC
  Administered 2017-10-11: 1000 mL via INTRAVENOUS

## 2017-10-11 MED ORDER — FENTANYL CITRATE (PF) 100 MCG/2ML IJ SOLN
25.0000 ug | INTRAMUSCULAR | Status: AC | PRN
  Administered 2017-10-11: 100 ug via INTRAVENOUS
  Filled 2017-10-11: qty 2

## 2017-10-11 MED ORDER — LIDOCAINE HCL 2 % IJ SOLN
20.0000 mL | Freq: Once | INTRAMUSCULAR | Status: AC
  Administered 2017-10-11: 400 mg
  Filled 2017-10-11: qty 40

## 2017-10-11 MED ORDER — ROPIVACAINE HCL 2 MG/ML IJ SOLN
9.0000 mL | Freq: Once | INTRAMUSCULAR | Status: AC
  Administered 2017-10-11: 9 mL via PERINEURAL
  Filled 2017-10-11: qty 10

## 2017-10-11 MED ORDER — MIDAZOLAM HCL 5 MG/5ML IJ SOLN
1.0000 mg | INTRAMUSCULAR | Status: AC | PRN
  Administered 2017-10-11: 4 mg via INTRAVENOUS
  Filled 2017-10-11: qty 5

## 2017-10-11 MED ORDER — GABAPENTIN 300 MG PO CAPS: 300.0000 mg | ORAL_CAPSULE | Freq: Three times a day (TID) | ORAL | 1 refills | Status: DC

## 2017-10-11 NOTE — Progress Notes (Signed)
Safety precautions to be maintained throughout the outpatient stay will include: orient to surroundings, keep bed in low position, maintain call bell within reach at all times, provide assistance with transfer out of bed and ambulation.  

## 2017-10-11 NOTE — Patient Instructions (Addendum)
____________________________________________________________________________________________  Post-Procedure Discharge Instructions  Instructions:  Apply ice: Fill a plastic sandwich bag with crushed ice. Cover it with a small towel and apply to injection site. Apply for 15 minutes then remove x 15 minutes. Repeat sequence on day of procedure, until you go to bed. The purpose is to minimize swelling and discomfort after procedure.  Apply heat: Apply heat to procedure site starting the day following the procedure. The purpose is to treat any soreness and discomfort from the procedure.  Food intake: Start with clear liquids (like water) and advance to regular food, as tolerated.   Physical activities: Keep activities to a minimum for the first 8 hours after the procedure.   Driving: If you have received any sedation, you are not allowed to drive for 24 hours after your procedure.  Blood thinner: Restart your blood thinner 6 hours after your procedure. (Only for those taking blood thinners)  Insulin: As soon as you can eat, you may resume your normal dosing schedule. (Only for those taking insulin)  Infection prevention: Keep procedure site clean and dry.  Post-procedure Pain Diary: Extremely important that this be done correctly and accurately. Recorded information will be used to determine the next step in treatment.  Pain evaluated is that of treated area only. Do not include pain from an untreated area.  Complete every hour, on the hour, for the initial 8 hours. Set an alarm to help you do this part accurately.  Do not go to sleep and have it completed later. It will not be accurate.  Follow-up appointment: Keep your follow-up appointment after the procedure. Usually 2 weeks for most procedures. (6 weeks in the case of radiofrequency.) Bring you pain diary.   Expect:  From numbing medicine (AKA: Local Anesthetics): Numbness or decrease in pain.  Onset: Full effect within 15  minutes of injected.  Duration: It will depend on the type of local anesthetic used. On the average, 1 to 8 hours.   From steroids: Decrease in swelling or inflammation. Once inflammation is improved, relief of the pain will follow.  Onset of benefits: Depends on the amount of swelling present. The more swelling, the longer it will take for the benefits to be seen. In some cases, up to 10 days.  Duration: Steroids will stay in the system x 2 weeks. Duration of benefits will depend on multiple posibilities including persistent irritating factors.  From procedure: Some discomfort is to be expected once the numbing medicine wears off. This should be minimal if ice and heat are applied as instructed.  Call if:  You experience numbness and weakness that gets worse with time, as opposed to wearing off.  New onset bowel or bladder incontinence. (This applies to Spinal procedures only)  Emergency Numbers:  Durning business hours (Monday - Thursday, 8:00 AM - 4:00 PM) (Friday, 9:00 AM - 12:00 Noon): [phone]  After hours: [phone] ____________________________________________________________________________________________   ____________________________________________________________________________________________  Preparing for Procedure with Sedation  Instructions: . Oral Intake: Do not eat or drink anything for at least 8 hours prior to your procedure. . Transportation: Public transportation is not allowed. Bring an adult driver. The driver must be physically present in our waiting room before any procedure can be started. Marland Kitchen Physical Assistance: Bring an adult physically capable of assisting you, in the event you need help. This adult should keep you company at home for at least 6 hours after the procedure. . Blood Pressure Medicine: Take your blood pressure medicine with a sip  of water the morning of the procedure. . Blood thinners:  . Diabetics on insulin: Notify the  staff so that you can be scheduled 1st case in the morning. If your diabetes requires high dose insulin, take only  of your normal insulin dose the morning of the procedure and notify the staff that you have done so. . Preventing infections: Shower with an antibacterial soap the morning of your procedure. . Build-up your immune system: Take 1000 mg of Vitamin C with every meal (3 times a day) the day prior to your procedure. Marland Kitchen. Antibiotics: Inform the staff if you have a condition or reason that requires you to take antibiotics before dental procedures. . Pregnancy: If you are pregnant, call and cancel the procedure. . Sickness: If you have a cold, fever, or any active infections, call and cancel the procedure. . Arrival: You must be in the facility at least 30 minutes prior to your scheduled procedure. . Children: Do not bring children with you. . Dress appropriately: Bring dark clothing that you would not mind if they get stained. . Valuables: Do not bring any jewelry or valuables.  Procedure appointments are reserved for interventional treatments only. Marland Kitchen. No Prescription Refills. . No medication changes will be discussed during procedure appointments. . No disability issues will be discussed.  Remember:  Regular Business hours are:  Monday to Thursday 8:00 AM to 4:00 PM  Provider's Schedule: Delano MetzFrancisco Deondria Puryear, MD:  Procedure days: Tuesday and Thursday 7:30 AM to 4:00 PM  Edward JollyBilal Lateef, MD:  Procedure days: Monday and Wednesday 7:30 AM to 4:00 PM ____________________________________________________________________________________________   ____________________________________________________________________________________________  Gabapentin Titration  Medication used: Gabapentin (Generic Name) or Neurontin (Brand Name) 300 mg tablets/capsules  Reasons to stop increasing the dose:  Reason 1: You get good relief of symptoms, in which case there is no need to increase the daily  dose any further.    Reason 2: You develop some side effects, such as sleeping all of the time, difficulty concentrating, or becoming disoriented, in which case you need to go down on the dose, to the prior level, where you were not experiencing any side effects. Stay on that dose longer, to allow more time for your body to get use it, before attempting to increase it again.   Reasons to stop increasing the dose: Reason 1: You get good relief of symptoms, in which case there is no need to increase the daily dose any further.  Reason 2: You develop some side effects, such as sleeping all of the time, difficulty concentrating, or becoming disoriented, in which case you need to go down on the dose, to the prior level, where you were not experiencing any side effects. Stay on that dose longer, to allow more time for your body to get use it, before attempting to increase it again.  Steps to increase medication: Step 1: Start by taking 1 (one) tablet at bedtime x 7 (seven) days.  Step 2: After 7 (seven) days of taking 1 (one) tablet at bedtime, increase it to 2 (two) tablets at bedtime. Stay on this dose x 7 (seven) days.  Step 3: After 7 (seven) days of taking 2 (two) tablets at bedtime, increase it to 3 (three) tablets at bedtime. Stay on this dose x another 7 (seven) days.  Step 4: After 7 (seven) days of taking 3 (three) tablet at bedtime, begin taking 1 (one) tablet at noon with lunch. Stay on this dose x another 7 (seven) days.  Step 5:  After 7 (seven) days of taking 3 (three) tablet at bedtime, and 1 (one) tablet at noon, then begin taking 1 (one) tablet in the afternoon with dinner. Stay on this dose x another 7 (seven) days.  Step 6: After 7 (seven) days of taking 3 (three) tablet at bedtime, 1 (one) tablet at noon, and 1 (one) tablet in the afternoon, then begin taking 1 (one) tablet in the morning with breakfast. Stay on this dose x another 7 (seven) days. At this point you should be taking  the medicine 4 (four) times a day, or about every 6 (six) hours. This daily regimen of taking the medicine 4 (four) times a day, will be maintained from now on. You should not take any doses any sooner than every 6 (six) hours.  Step 7: After 7 (seven) days of taking 3 (three) tablet at bedtime, 1 (one) tablet at noon, 1 (one) tablet in the afternoon, and 1 (one) tablet in the morning, begin taking 2 (two) tablets at noon with lunch. Stay on this dose x another 7 (seven) days.   Step 8: After 7 (seven) days of taking 3 (three) tablet at bedtime, 2 (two) tablets at noon, 1 (one) tablet in the afternoon, and 1 (one) tablet in the morning, begin taking 2 (two) tablets in the afternoon with dinner. Stay on this dose x another 7 (seven) days.   Step 9: After 7 (seven) days of taking 3 (three) tablet at bedtime, 2 (two) tablets at noon, 2 (two) tablets in the afternoon, and 1 (one) tablet in the morning, begin taking 2 (two) tablets in the morning with breakfast. Stay on this dose x another 7 (seven) days. At this point you should be taking the medicine 4 (four) times a day, or about every 6 (six) hours. This daily regimen of taking the medicine 4 (four) times a day, will be maintained from now on. You should not take any doses any sooner than every 6 (six) hours.  Step 10: After 7 (seven) days of taking 3 (three) tablet at bedtime, 2 (two) tablets at noon, 2 (two) tablets in the afternoon, and 2 (two) tablets in the morning, begin taking 3 (three) tablets at noon with lunch. Stay on this dose x another 7 (seven) days.   Step 11: After 7 (seven) days of taking 3 (three) tablet at bedtime, 3 (three) tablets at noon, 2 (two) tablets in the afternoon, and 2 (two) tablets in the morning, begin taking 3 (three) tablets in the afternoon with dinner. Stay on this dose x another 7 (seven) days.   Step 12: After 7 (seven) days of taking 3 (three) tablet at bedtime, 3 (three) tablets at noon, 3 (three) tablets in the  afternoon, and 2 (two) tablet in the morning, begin taking 3 (three) tablets in the morning with breakfast. Stay on this dose x another 7 (seven) days. At this point you should be taking the medicine 4 (four) times a day, or about every 6 (six) hours. This daily regimen of taking the medicine 4 (four) times a day, will be maintained from now on.   Endpoint: Once you have reached the maximum dose you can tolerate without side-effects, contact your physician so as to evaluate the results of the regimen.   Questions: Feel free to contact us for any questions or problems at [phone] ____________________________________________________________________________________________

## 2017-10-12 ENCOUNTER — Telehealth: Payer: Self-pay | Admitting: *Deleted

## 2017-10-12 NOTE — Telephone Encounter (Signed)
Wife states patient had a good night and was still asleep this AM. Instructed to call for any problems.

## 2017-10-25 ENCOUNTER — Encounter: Payer: Self-pay | Admitting: Nurse Practitioner

## 2017-10-25 ENCOUNTER — Ambulatory Visit: Payer: BLUE CROSS/BLUE SHIELD | Attending: Nurse Practitioner | Admitting: Nurse Practitioner

## 2017-10-25 ENCOUNTER — Other Ambulatory Visit: Payer: Self-pay

## 2017-10-25 VITALS — BP 120/75 | HR 76 | Temp 98.2°F | Resp 16 | Ht 71.0 in | Wt 270.0 lb

## 2017-10-25 DIAGNOSIS — F329 Major depressive disorder, single episode, unspecified: Secondary | ICD-10-CM | POA: Insufficient documentation

## 2017-10-25 DIAGNOSIS — M533 Sacrococcygeal disorders, not elsewhere classified: Secondary | ICD-10-CM | POA: Diagnosis not present

## 2017-10-25 DIAGNOSIS — Z79891 Long term (current) use of opiate analgesic: Secondary | ICD-10-CM

## 2017-10-25 DIAGNOSIS — E559 Vitamin D deficiency, unspecified: Secondary | ICD-10-CM | POA: Insufficient documentation

## 2017-10-25 DIAGNOSIS — M25562 Pain in left knee: Secondary | ICD-10-CM | POA: Diagnosis not present

## 2017-10-25 DIAGNOSIS — M79604 Pain in right leg: Secondary | ICD-10-CM | POA: Diagnosis not present

## 2017-10-25 DIAGNOSIS — M5136 Other intervertebral disc degeneration, lumbar region: Secondary | ICD-10-CM | POA: Insufficient documentation

## 2017-10-25 DIAGNOSIS — Z882 Allergy status to sulfonamides status: Secondary | ICD-10-CM | POA: Insufficient documentation

## 2017-10-25 DIAGNOSIS — M79605 Pain in left leg: Secondary | ICD-10-CM | POA: Insufficient documentation

## 2017-10-25 DIAGNOSIS — G894 Chronic pain syndrome: Secondary | ICD-10-CM | POA: Insufficient documentation

## 2017-10-25 DIAGNOSIS — M48061 Spinal stenosis, lumbar region without neurogenic claudication: Secondary | ICD-10-CM | POA: Insufficient documentation

## 2017-10-25 DIAGNOSIS — Z5181 Encounter for therapeutic drug level monitoring: Secondary | ICD-10-CM | POA: Diagnosis present

## 2017-10-25 DIAGNOSIS — F17210 Nicotine dependence, cigarettes, uncomplicated: Secondary | ICD-10-CM | POA: Diagnosis not present

## 2017-10-25 DIAGNOSIS — M47816 Spondylosis without myelopathy or radiculopathy, lumbar region: Secondary | ICD-10-CM | POA: Diagnosis not present

## 2017-10-25 DIAGNOSIS — G8929 Other chronic pain: Secondary | ICD-10-CM

## 2017-10-25 DIAGNOSIS — Z79899 Other long term (current) drug therapy: Secondary | ICD-10-CM | POA: Diagnosis not present

## 2017-10-25 MED ORDER — OXYCODONE HCL 10 MG PO TABS: 10.0000 mg | ORAL_TABLET | Freq: Four times a day (QID) | ORAL | 0 refills | Status: DC | PRN

## 2017-10-25 MED ORDER — GABAPENTIN 300 MG PO CAPS: 300.0000 mg | ORAL_CAPSULE | Freq: Three times a day (TID) | ORAL | 1 refills | Status: DC

## 2017-10-25 NOTE — Progress Notes (Signed)
Patient's Name: Nathan Lambert.  MRN: [id number]  Referring Provider: Vidal Schwalbe, MD  DOB: 1981/06/12  PCP: Vidal Schwalbe, MD  DOS: 10/25/2017  Note by: Vevelyn Francois NP  Service setting: Ambulatory outpatient  Specialty: Interventional Pain Management  Location: [facility] (AMB) Pain Management Facility    Patient type: Established    Primary Reason(s) for Visit: Encounter for prescription drug management. (Level of risk: moderate)  CC: Back Pain (lower)  HPI  Mr. Putzier is a 37 y.o. year old, male patient, who comes today for a medication management evaluation. 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 His primarily concern today is the Back Pain (lower)  Pain Assessment: Location: Lower Back   Onset: More than a month ago Duration: Chronic pain Quality: Constant, Numbness, Tingling Severity: 3 /10 (self-reported pain score)  Note: Reported level is compatible with observation.                          Effect on ADL: limits activity,  Timing: Intermittent Modifying factors: medications  Mr. Stumph was last scheduled  for an appointment on 09/01/2017 for medication management. During today's appointment we reviewed Mr. Bonsell chronic pain status, as well as his outpatient medication regimen. HE admits that he is having some numbess at the site. He feels like this will get better and become better with time. He admits that he is scheduled for the left RFA.  The patient  reports that he does not use drugs. His body mass index is 37.66 kg/m.  Further details on both, my assessment(s), as well as the proposed treatment plan, please see below.  Controlled Substance Pharmacotherapy Assessment REMS (Risk Evaluation and Mitigation Strategy)  Analgesic:oxycodone 10 mg 4 times daily MME/day:63m/day.  .Ignatius Specking RN  10/25/2017 10:41 AM  Sign at close encounter Nursing Pain Medication Assessment:  Safety precautions to be maintained throughout the outpatient stay will include: orient to surroundings, keep bed in low position, maintain call bell within reach at all times, provide assistance with transfer out of bed and ambulation.  Medication Inspection Compliance: Pill count conducted under aseptic conditions, in front of the patient. Neither the pills nor the bottle was removed from the patient's sight at any time. Once count was completed pills were immediately returned to the patient in their original bottle.  Medication #1: Hydrocodone/APAP Pill/Patch Count: 7 of 28 pills remain Pill/Patch Appearance: Markings consistent with prescribed medication Bottle Appearance: Standard pharmacy container. Clearly labeled. Filled Date: 3 / 27 / 2019 Last Medication intake:  Day before yesterday  Medication #2: Oxycodone ER (OxyContin) Pill/Patch Count: 64 of 120 pills remain Pill/Patch Appearance: Markings consistent with prescribed medication Bottle Appearance: Standard pharmacy container. Clearly labeled. Filled Date: 3 / 29 / 2019 Last Medication intake:  Today   Pharmacokinetics: Liberation and  absorption (onset of action): WNL Distribution (time to peak effect): WNL Metabolism and excretion (duration of action): WNL         Pharmacodynamics: Desired effects: Analgesia: Mr. Carreno reports >50% benefit. Functional ability: Patient reports that medication allows him to accomplish basic ADLs Clinically meaningful improvement in function (CMIF): Sustained CMIF goals met Perceived effectiveness: Described as relatively effective, allowing for increase in activities of daily living (ADL) Undesirable effects: Side-effects or Adverse reactions: None reported Monitoring: [place] PMP: Online review of the past 67-monthperiod conducted. Compliant with practice rules and regulations Last UDS on record: Summary  Date Value Ref Range Status  06/08/2017 FINAL  Final    Comment:    ==================================================================== TOXASSURE SELECT 13 (MW) ==================================================================== Test                             Result       Flag       Units Drug Present and Declared for Prescription Verification   Oxycodone                      1000         EXPECTED   ng/mg creat   Oxymorphone                    1590         EXPECTED   ng/mg creat   Noroxycodone                   892          EXPECTED   ng/mg creat   Noroxymorphone                 370          EXPECTED   ng/mg creat    Sources of oxycodone are scheduled prescription medications.    Oxymorphone, noroxycodone, and noroxymorphone are expected    metabolites of oxycodone. Oxymorphone is also available as a    scheduled prescription medication. ==================================================================== Test                      Result    Flag   Units      Ref Range   Creatinine              200              mg/dL      >=20 ==================================================================== Declared Medications:  The flagging and interpretation on this report are  based on the  following declared medications.  Unexpected results may arise from  inaccuracies in the declared medications.  **Note: The testing scope of this panel includes these medications:  Oxycodone  **Note: The testing scope of this panel does not include following  reported medications:  Gabapentin  Loratadine  Montelukast  Naloxone  Sildenafil ==================================================================== For clinical consultation, please call [phone]====    UDS interpretation: Compliant          Medication Assessment Form: Reviewed. Patient indicates being compliant with therapy Treatment compliance: Compliant Risk Assessment Profile: Aberrant behavior: See prior evaluations. None observed or detected today Comorbid factors increasing risk of overdose: See prior notes. No additional risks detected today Risk of substance use disorder (SUD): Low Opioid Risk Tool - 10/25/17 1037      Family History of Substance Abuse   Alcohol  Negative    Illegal Drugs  Negative    Rx Drugs  Negative      Personal History of Substance Abuse   Alcohol  Negative    Illegal Drugs  Negative    Rx Drugs  Negative      History of Preadolescent Sexual Abuse   History of Preadolescent Sexual Abuse  Negative or Male      Psychological Disease   Psychological Disease  Negative    Depression  Negative      Total Score   Opioid Risk Tool Scoring  0    Opioid Risk Interpretation  Low Risk      ORT Scoring interpretation table:  Score <3 = Low Risk for SUD  Score between 4-7 = Moderate Risk for SUD  Score >8 = High Risk for Opioid Abuse   Risk Mitigation Strategies:  Patient Counseling: Covered Patient-Prescriber Agreement (PPA): Present and active  Notification to other healthcare providers: Done  Pharmacologic Plan: No change in therapy, at this time.             Laboratory Chemistry  Inflammation  Markers (CRP: Acute Phase) (ESR: Chronic Phase) Lab Results  Component Value Date   CRP 8.5 (H) 12/27/2016   ESRSEDRATE 35 (H) 12/27/2016                         Rheumatology Markers No results found for: Elayne Guerin, [facility]                      Renal Function Markers Lab Results  Component Value Date   BUN 6 12/27/2016   CREATININE 0.83 12/27/2016   GFRAA 133 12/27/2016   GFRNONAA 115 12/27/2016                              Hepatic Function Markers Lab Results  Component Value Date   AST 11 12/27/2016   ALT 15 12/27/2016   ALBUMIN 4.2 12/27/2016   ALKPHOS 56 12/27/2016                        Electrolytes Lab Results  Component Value Date   NA 144 12/27/2016   K 4.3 12/27/2016   CL 106 12/27/2016   CALCIUM 9.0 12/27/2016   MG 2.1 12/27/2016                        Neuropathy Markers Lab Results  Component Value Date   VITAMINB12 462 12/27/2016                        Bone Pathology Markers Lab Results  Component Value Date   25OHVITD1 17 (L) 12/27/2016   25OHVITD2 <1.0 12/27/2016   25OHVITD3 17 12/27/2016   TESTOFREE 2.6 (L) 06/08/2017   TESTOSTERONE 116 (L) 06/08/2017                         Coagulation Parameters No results found for: INR, LABPROT, APTT, PLT, DDIMER                      Cardiovascular Markers No results found for: BNP, CKTOTAL, CKMB, TROPONINI, HGB, HCT                       CA Markers No results found  for: CEA, CA125, LABCA2                      Note: Lab results reviewed.  Recent Diagnostic Imaging Results  DG C-Arm 1-60 Min-No Report Fluoroscopy was utilized by the requesting physician.  No radiographic  interpretation.   Complexity Note: Imaging results reviewed. Results shared with Mr. Vandervelden, using Layman's terms.                         Meds   Current Outpatient Medications:  .  [START ON 11/13/2017] gabapentin (NEURONTIN) 300 MG capsule, Take 1-3 capsules (300-900 mg total) by  mouth 3 (three) times daily. Follow written titration schedule., Disp: 270 capsule, Rfl: 1 .  loratadine (CLARITIN) 10 MG tablet, Take 10 mg by mouth daily as needed for allergies., Disp: , Rfl:  .  montelukast (SINGULAIR) 10 MG tablet, Take 10 mg by mouth at bedtime., Disp: , Rfl:  .  naloxone (NARCAN) 2 MG/2ML injection, Inject content of syringe into thigh muscle. Call 911., Disp: 2 Syringe, Rfl: 1 .  [START ON 12/13/2017] Oxycodone HCl 10 MG TABS, Take 1 tablet (10 mg total) by mouth every 6 (six) hours as needed., Disp: 120 tablet, Rfl: 0 .  testosterone cypionate (DEPOTESTOSTERONE CYPIONATE) 200 MG/ML injection, Inject into the muscle every 7 (seven) days., Disp: , Rfl:  .  VIAGRA 100 MG tablet, Take 1 tablet (100 mg total) by mouth as needed for erectile dysfunction., Disp: 20 tablet, Rfl: 1 .  [START ON 11/13/2017] Oxycodone HCl 10 MG TABS, Take 1 tablet (10 mg total) by mouth every 6 (six) hours as needed., Disp: 120 tablet, Rfl: 0  ROS  Constitutional: Denies any fever or chills Gastrointestinal: No reported hemesis, hematochezia, vomiting, or acute GI distress Musculoskeletal: Denies any acute onset joint swelling, redness, loss of ROM, or weakness Neurological: No reported episodes of acute onset apraxia, aphasia, dysarthria, agnosia, amnesia, paralysis, loss of coordination, or loss of consciousness  Allergies  Mr. Shugars is allergic to bactrim [sulfamethoxazole-trimethoprim].  PFSH  Drug: Mr. Noren  reports that he does not use drugs. Alcohol:  reports that he does not drink alcohol. Tobacco:  reports that he has been smoking cigarettes.  He has a 10.00 pack-year smoking history. He has never used smokeless tobacco. Medical:  has a past medical history of Allergy (2012), Chronic low back pain (Primary Area of Pain) (Bilateral) (R>L), Degeneration of lumbar or lumbosacral intervertebral disc (L4-L5) (12/27/2016), Headache, and Opiate use (02/15/2017). Surgical: Mr.  Beals  has a past surgical history that includes Fracture surgery (Left); Spine surgery (Right, 06/16/2015); and Back surgery. Family: family history includes AAA (abdominal aortic aneurysm) in his mother; Diabetes in his father; Stroke in his mother.  Constitutional Exam  General appearance: Well nourished, well developed, and well hydrated. In no apparent acute distress Vitals:   10/25/17 1029  BP: 120/75  Pulse: 76  Resp: 16  Temp: 98.2 F (36.8 C)  SpO2: 100%  Weight: 270 lb (122.5 kg)  Height: 5' 11"  (1.803 m)   BMI Assessment: Estimated body mass index is 37.66 kg/m as calculated from the following:   Height as of this encounter: 5' 11"  (1.803 m).   Weight as of this encounter: 270 lb (122.5 kg). Psych/Mental status: Alert, oriented x 3 (person, place, & time)       Eyes: PERLA Respiratory: No evidence of acute respiratory distress  Cervical Spine Area Exam  Skin &  Axial Inspection: No masses, redness, edema, swelling, or associated skin lesions Alignment: Symmetrical Functional ROM: Unrestricted ROM      Stability: No instability detected Muscle Tone/Strength: Functionally intact. No obvious neuro-muscular anomalies detected. Sensory (Neurological): Unimpaired Palpation: No palpable anomalies              Upper Extremity (UE) Exam    Side: Right upper extremity  Side: Left upper extremity  Skin & Extremity Inspection: Skin color, temperature, and hair growth are WNL. No peripheral edema or cyanosis. No masses, redness, swelling, asymmetry, or associated skin lesions. No contractures.  Skin & Extremity Inspection: Skin color, temperature, and hair growth are WNL. No peripheral edema or cyanosis. No masses, redness, swelling, asymmetry, or associated skin lesions. No contractures.  Functional ROM: Unrestricted ROM          Functional ROM: Unrestricted ROM          Muscle Tone/Strength: Functionally intact. No obvious neuro-muscular anomalies detected.  Muscle  Tone/Strength: Functionally intact. No obvious neuro-muscular anomalies detected.  Sensory (Neurological): Unimpaired          Sensory (Neurological): Unimpaired          Palpation: No palpable anomalies              Palpation: No palpable anomalies              Specialized Test(s): Deferred         Specialized Test(s): Deferred          Thoracic Spine Area Exam  Skin & Axial Inspection: No masses, redness, or swelling Alignment: Symmetrical Functional ROM: Unrestricted ROM Stability: No instability detected Muscle Tone/Strength: Functionally intact. No obvious neuro-muscular anomalies detected. Sensory (Neurological): Unimpaired Muscle strength & Tone: No palpable anomalies  Lumbar Spine Area Exam  Skin & Axial Inspection: Well healed scar from previous spine surgery detected Alignment: Symmetrical Functional ROM: Unrestricted ROM      Stability: No instability detected Muscle Tone/Strength: Increased muscle tone over affected area Sensory (Neurological): Unimpaired Palpation: No palpable anomalies       Provocative Tests: Lumbar Hyperextension and rotation test: evaluation deferred today       Lumbar Lateral bending test: evaluation deferred today       Patrick's Maneuver: evaluation deferred today                    Gait & Posture Assessment  Ambulation: Unassisted Gait: Relatively normal for age and body habitus Posture: WNL   Lower Extremity Exam    Side: Right lower extremity  Side: Left lower extremity  Skin & Extremity Inspection: Skin color, temperature, and hair growth are WNL. No peripheral edema or cyanosis. No masses, redness, swelling, asymmetry, or associated skin lesions. No contractures.  Skin & Extremity Inspection: Skin color, temperature, and hair growth are WNL. No peripheral edema or cyanosis. No masses, redness, swelling, asymmetry, or associated skin lesions. No contractures.  Functional ROM: Unrestricted ROM          Functional ROM: Unrestricted ROM           Muscle Tone/Strength: Functionally intact. No obvious neuro-muscular anomalies detected.  Muscle Tone/Strength: Functionally intact. No obvious neuro-muscular anomalies detected.  Sensory (Neurological): Unimpaired  Sensory (Neurological): Unimpaired  Palpation: No palpable anomalies  Palpation: No palpable anomalies   Assessment  Primary Diagnosis & Pertinent Problem List: The primary encounter diagnosis was Lumbar facet syndrome (Bilateral) (R>L). Diagnoses of Chronic sacroiliac joint pain (Bilateral) (R>L), Chronic knee pain (Secondary Area of pain) (  Left), Chronic pain syndrome, and Long term (current) use of opiate analgesic were also pertinent to this visit.  Status Diagnosis  Responding Controlled Controlled 1. Lumbar facet syndrome (Bilateral) (R>L)   2. Chronic sacroiliac joint pain (Bilateral) (R>L)   3. Chronic knee pain (Secondary Area of pain) (Left)   4. Chronic pain syndrome   5. Long term (current) use of opiate analgesic     Problems updated and reviewed during this visit: No problems updated. Plan of Care  Pharmacotherapy (Medications Ordered): Meds ordered this encounter  Medications  . gabapentin (NEURONTIN) 300 MG capsule    Sig: Take 1-3 capsules (300-900 mg total) by mouth 3 (three) times daily. Follow written titration schedule.    Dispense:  270 capsule    Refill:  1    Do not place medication on "Automatic Refill". Fill one day early if pharmacy is closed on scheduled refill date.    Order Specific Question:   Supervising Provider    Answer:   Milinda Pointer [phone]  . Oxycodone HCl 10 MG TABS    Sig: Take 1 tablet (10 mg total) by mouth every 6 (six) hours as needed.    Dispense:  120 tablet    Refill:  0    Do not place this medication, or any other prescription from our practice, on "Automatic Refill". Patient may have prescription filled one day early if pharmacy is closed on scheduled refill date. Do not fill until: 12/13/2017 To last  until:01/12/2018    Order Specific Question:   Supervising Provider    Answer:   Milinda Pointer [phone]  . Oxycodone HCl 10 MG TABS    Sig: Take 1 tablet (10 mg total) by mouth every 6 (six) hours as needed.    Dispense:  120 tablet    Refill:  0    Do not place this medication, or any other prescription from our practice, on "Automatic Refill". Patient may have prescription filled one day early if pharmacy is closed on scheduled refill date. Do not fill until:11/13/2017 To last until: 12/13/2017    Order Specific Question:   Supervising Provider    Answer:   Milinda Pointer [phone]   New Prescriptions   No medications on file   Medications administered today: Le L. Barkalow Jr. had no medications administered during this visit. Lab-work, procedure(s), and/or referral(s): No orders of the defined types were placed in this encounter.  Imaging and/or referral(s): None  Interventional therapies: Planned, scheduled, and/or pending:   Right lumbar facet RFA   Considering:  Diagnostic bilateral lumbar facet block Possible bilateral lumbar facet RFA Diagnostic bilateral sacroiliac joint block Possible bilateral sacroiliac joint RFA Diagnostic caudal epidural steroid injection + diagnostic epidurogram Possible Racz procedure Diagnostic left intra-articular knee injection with local anesthetic and steroid Possible left series of 5 intra-articular Hyalgan knee injections Diagnostic left Genicular nerve block Possible left Genicular nerve RFA   Palliative PRN treatment(s):  None at this time   Provider-requested follow-up: Return in about 8 weeks (around 12/22/2017) for MedMgmt with Me Donella Stade Edison Pace).  Future Appointments  Date Time Provider [place]  11/24/2017  2:15 PM Milinda Pointer, MD [facility] None  12/22/2017  9:45 AM Vevelyn Francois, NP [facility] None   Primary Care Physician: Vidal Schwalbe, MD Location: [facility] Note by: Vevelyn Francois NP Date: 10/25/2017; Time: 1:10 PM  Pain Score Disclaimer: We use the NRS-11 scale. This is a self-reported, subjective measurement of pain severity with only  modest accuracy. It is used primarily to identify changes within a particular patient. It must be understood that outpatient pain scales are significantly less accurate that those used for research, where they can be applied under ideal controlled circumstances with minimal exposure to variables. In reality, the score is likely to be a combination of pain intensity and pain affect, where pain affect describes the degree of emotional arousal or changes in action readiness caused by the sensory experience of pain. Factors such as social and work situation, setting, emotional state, anxiety levels, expectation, and prior pain experience may influence pain perception and show large inter-individual differences that may also be affected by time variables.  Patient instructions provided during this appointment: Patient Instructions  ____________________________________________________________________________________________  Appointment Policy Summary  It is our goal and responsibility to provide the medical community with assistance in the evaluation and management of patients with chronic pain. Unfortunately our resources are limited. Because we do not have an unlimited amount of time, or available appointments, we are required to closely monitor and manage their use. The following rules exist to maximize their use:  Patient's responsibilities: 1. Punctuality:  At what time should I arrive? You should be physically present in our office 30 minutes before your scheduled appointment. Your scheduled appointment is with your assigned healthcare provider. However, it takes 5-10 minutes to be "checked-in", and another 15 minutes for the nurses to do the admission. If you arrive to our office at the time you  were given for your appointment, you will end up being at least 20-25 minutes late to your appointment with the provider. 2. Tardiness:  What happens if I arrive only a few minutes after my scheduled appointment time? You will need to reschedule your appointment. The cutoff is your appointment time. This is why it is so important that you arrive at least 30 minutes before that appointment. If you have an appointment scheduled for 10:00 AM and you arrive at 10:01, you will be required to reschedule your appointment.  3. Plan ahead:  Always assume that you will encounter traffic on your way in. Plan for it. If you are dependent on a driver, make sure they understand these rules and the need to arrive early. 4. Other appointments and responsibilities:  Avoid scheduling any other appointments before or after your pain clinic appointments.  5. Be prepared:  Write down everything that you need to discuss with your healthcare provider and give this information to the admitting nurse. Write down the medications that you will need refilled. Bring your pills and bottles (even the empty ones), to all of your appointments, except for those where a procedure is scheduled. 6. No children or pets:  Find someone to take care of them. It is not appropriate to bring them in. 7. Scheduling changes:  We request "advanced notification" of any changes or cancellations. 8. Advanced notification:  Defined as a time period of more than 24 hours prior to the originally scheduled appointment. This allows for the appointment to be offered to other patients. 9. Rescheduling:  When a visit is rescheduled, it will require the cancellation of the original appointment. For this reason they both fall within the category of "Cancellations".  10. Cancellations:  They require advanced notification. Any cancellation less than 24 hours before the  appointment will be recorded as a "No Show". 11. No Show:  Defined as an unkept  appointment where the patient failed to notify or declare to the practice their intention or inability  to keep the appointment.  Corrective process for repeat offenders:  1. Tardiness: Three (3) episodes of rescheduling due to late arrivals will be recorded as one (1) "No Show". 2. Cancellation or reschedule: Three (3) cancellations or rescheduling will be recorded as one (1) "No Show". 3. "No Shows": Three (3) "No Shows" within a 12 month period will result in discharge from the practice. ____________________________________________________________________________________________  ____________________________________________________________________________________________  Medication Rules  Applies to: All patients receiving prescriptions (written or electronic).  Pharmacy of record: Pharmacy where electronic prescriptions will be sent. If written prescriptions are taken to a different pharmacy, please inform the nursing staff. The pharmacy listed in the electronic medical record should be the one where you would like electronic prescriptions to be sent.  Prescription refills: Only during scheduled appointments. Applies to both, written and electronic prescriptions.  NOTE: The following applies primarily to controlled substances (Opioid* Pain Medications).   Patient's responsibilities: 1. Pain Pills: Bring all pain pills to every appointment (except for procedure appointments). 2. Pill Bottles: Bring pills in original pharmacy bottle. Always bring newest bottle. Bring bottle, even if empty. 3. Medication refills: You are responsible for knowing and keeping track of what medications you need refilled. The day before your appointment, write a list of all prescriptions that need to be refilled. Bring that list to your appointment and give it to the admitting nurse. Prescriptions will be written only during appointments. If you forget a medication, it will not be "Called in", "Faxed", or  "electronically sent". You will need to get another appointment to get these prescribed. 4. Prescription Accuracy: You are responsible for carefully inspecting your prescriptions before leaving our office. Have the discharge nurse carefully go over each prescription with you, before taking them home. Make sure that your name is accurately spelled, that your address is correct. Check the name and dose of your medication to make sure it is accurate. Check the number of pills, and the written instructions to make sure they are clear and accurate. Make sure that you are given enough medication to last until your next medication refill appointment. 5. Taking Medication: Take medication as prescribed. Never take more pills than instructed. Never take medication more frequently than prescribed. Taking less pills or less frequently is permitted and encouraged, when it comes to controlled substances (written prescriptions).  6. Inform other Doctors: Always inform, all of your healthcare providers, of all the medications you take. 7. Pain Medication from other Providers: You are not allowed to accept any additional pain medication from any other Doctor or Healthcare provider. There are two exceptions to this rule. (see below) In the event that you require additional pain medication, you are responsible for notifying us, as stated below. 8. Medication Agreement: You are responsible for carefully reading and following our Medication Agreement. This must be signed before receiving any prescriptions from our practice. Safely store a copy of your signed Agreement. Violations to the Agreement will result in no further prescriptions. (Additional copies of our Medication Agreement are available upon request.) 9. Laws, Rules, & Regulations: All patients are expected to follow all Federal and [employer], [employer], Rules, [employer]. Ignorance of the Laws does not constitute a valid excuse. The use of any illegal substances is  prohibited. 10. Adopted CDC guidelines & recommendations: Target dosing levels will be at or below 60 MME/day. Use of benzodiazepines** is not recommended.  Exceptions: There are only two exceptions to the rule of not receiving pain medications from other Healthcare Providers. 1.  Exception #1 (Emergencies): In the event of an emergency (i.e.: accident requiring emergency care), you are allowed to receive additional pain medication. However, you are responsible for: As soon as you are able, call our office [phone], at any time of the day or night, and leave a message stating your name, the date and nature of the emergency, and the name and dose of the medication prescribed. In the event that your call is answered by a member of our staff, make sure to document and save the date, time, and the name of the person that took your information.  2. Exception #2 (Planned Surgery): In the event that you are scheduled by another doctor or dentist to have any type of surgery or procedure, you are allowed (for a period no longer than 30 days), to receive additional pain medication, for the acute post-op pain. However, in this case, you are responsible for picking up a copy of our "Post-op Pain Management for Surgeons" handout, and giving it to your surgeon or dentist. This document is available at our office, and does not require an appointment to obtain it. Simply go to our office during business hours (Monday-Thursday from 8:00 AM to 4:00 PM) (Friday 8:00 AM to 12:00 Noon) or if you have a scheduled appointment with [place], prior to your surgery, and ask for it by name. In addition, you will need to provide [place] with your name, name of your surgeon, type of surgery, and date of procedure or surgery.  *Opioid medications include: morphine, codeine, oxycodone, oxymorphone, hydrocodone, hydromorphone, meperidine, tramadol, tapentadol, buprenorphine, fentanyl, methadone. **Benzodiazepine medications include: diazepam  (Valium), alprazolam (Xanax), clonazepam (Klonopine), lorazepam (Ativan), clorazepate (Tranxene), chlordiazepoxide (Librium), estazolam (Prosom), oxazepam (Serax), temazepam (Restoril), triazolam (Halcion) (Last updated: 09/15/2017) ____________________________________________________________________________________________

## 2017-10-25 NOTE — Progress Notes (Signed)
Nursing Pain Medication Assessment:  Safety precautions to be maintained throughout the outpatient stay will include: orient to surroundings, keep bed in low position, maintain call bell within reach at all times, provide assistance with transfer out of bed and ambulation.  Medication Inspection Compliance: Pill count conducted under aseptic conditions, in front of the patient. Neither the pills nor the bottle was removed from the patient's sight at any time. Once count was completed pills were immediately returned to the patient in their original bottle.  Medication #1: Hydrocodone/APAP Pill/Patch Count: 7 of 28 pills remain Pill/Patch Appearance: Markings consistent with prescribed medication Bottle Appearance: Standard pharmacy container. Clearly labeled. Filled Date: 3 / 27 / 2019 Last Medication intake:  Day before yesterday  Medication #2: Oxycodone ER (OxyContin) Pill/Patch Count: 64 of 120 pills remain Pill/Patch Appearance: Markings consistent with prescribed medication Bottle Appearance: Standard pharmacy container. Clearly labeled. Filled Date: 3 / 29 / 2019 Last Medication intake:  Today

## 2017-10-25 NOTE — Patient Instructions (Signed)
____________________________________________________________________________________________  Appointment Policy Summary  It is our goal and responsibility to provide the medical community with assistance in the evaluation and management of patients with chronic pain. Unfortunately our resources are limited. Because we do not have an unlimited amount of time, or available appointments, we are required to closely monitor and manage their use. The following rules exist to maximize their use:  Patient's responsibilities: 1. Punctuality:  At what time should I arrive? You should be physically present in our office 30 minutes before your scheduled appointment. Your scheduled appointment is with your assigned healthcare provider. However, it takes 5-10 minutes to be "checked-in", and another 15 minutes for the nurses to do the admission. If you arrive to our office at the time you were given for your appointment, you will end up being at least 20-25 minutes late to your appointment with the provider. 2. Tardiness:  What happens if I arrive only a few minutes after my scheduled appointment time? You will need to reschedule your appointment. The cutoff is your appointment time. This is why it is so important that you arrive at least 30 minutes before that appointment. If you have an appointment scheduled for 10:00 AM and you arrive at 10:01, you will be required to reschedule your appointment.  3. Plan ahead:  Always assume that you will encounter traffic on your way in. Plan for it. If you are dependent on a driver, make sure they understand these rules and the need to arrive early. 4. Other appointments and responsibilities:  Avoid scheduling any other appointments before or after your pain clinic appointments.  5. Be prepared:  Write down everything that you need to discuss with your healthcare provider and give this information to the admitting nurse. Write down the medications that you will need  refilled. Bring your pills and bottles (even the empty ones), to all of your appointments, except for those where a procedure is scheduled. 6. No children or pets:  Find someone to take care of them. It is not appropriate to bring them in. 7. Scheduling changes:  We request "advanced notification" of any changes or cancellations. 8. Advanced notification:  Defined as a time period of more than 24 hours prior to the originally scheduled appointment. This allows for the appointment to be offered to other patients. 9. Rescheduling:  When a visit is rescheduled, it will require the cancellation of the original appointment. For this reason they both fall within the category of "Cancellations".  10. Cancellations:  They require advanced notification. Any cancellation less than 24 hours before the  appointment will be recorded as a "No Show". 11. No Show:  Defined as an unkept appointment where the patient failed to notify or declare to the practice their intention or inability to keep the appointment.  Corrective process for repeat offenders:  1. Tardiness: Three (3) episodes of rescheduling due to late arrivals will be recorded as one (1) "No Show". 2. Cancellation or reschedule: Three (3) cancellations or rescheduling will be recorded as one (1) "No Show". 3. "No Shows": Three (3) "No Shows" within a 12 month period will result in discharge from the practice. ____________________________________________________________________________________________  ____________________________________________________________________________________________  Medication Rules  Applies to: All patients receiving prescriptions (written or electronic).  Pharmacy of record: Pharmacy where electronic prescriptions will be sent. If written prescriptions are taken to a different pharmacy, please inform the nursing staff. The pharmacy listed in the electronic medical record should be the one where you would like  electronic prescriptions   to be sent.  Prescription refills: Only during scheduled appointments. Applies to both, written and electronic prescriptions.  NOTE: The following applies primarily to controlled substances (Opioid* Pain Medications).   Patient's responsibilities: 1. Pain Pills: Bring all pain pills to every appointment (except for procedure appointments). 2. Pill Bottles: Bring pills in original pharmacy bottle. Always bring newest bottle. Bring bottle, even if empty. 3. Medication refills: You are responsible for knowing and keeping track of what medications you need refilled. The day before your appointment, write a list of all prescriptions that need to be refilled. Bring that list to your appointment and give it to the admitting nurse. Prescriptions will be written only during appointments. If you forget a medication, it will not be "Called in", "Faxed", or "electronically sent". You will need to get another appointment to get these prescribed. 4. Prescription Accuracy: You are responsible for carefully inspecting your prescriptions before leaving our office. Have the discharge nurse carefully go over each prescription with you, before taking them home. Make sure that your name is accurately spelled, that your address is correct. Check the name and dose of your medication to make sure it is accurate. Check the number of pills, and the written instructions to make sure they are clear and accurate. Make sure that you are given enough medication to last until your next medication refill appointment. 5. Taking Medication: Take medication as prescribed. Never take more pills than instructed. Never take medication more frequently than prescribed. Taking less pills or less frequently is permitted and encouraged, when it comes to controlled substances (written prescriptions).  6. Inform other Doctors: Always inform, all of your healthcare providers, of all the medications you take. 7. Pain  Medication from other Providers: You are not allowed to accept any additional pain medication from any other Doctor or Healthcare provider. There are two exceptions to this rule. (see below) In the event that you require additional pain medication, you are responsible for notifying us, as stated below. 8. Medication Agreement: You are responsible for carefully reading and following our Medication Agreement. This must be signed before receiving any prescriptions from our practice. Safely store a copy of your signed Agreement. Violations to the Agreement will result in no further prescriptions. (Additional copies of our Medication Agreement are available upon request.) 9. Laws, Rules, & Regulations: All patients are expected to follow all Federal and State Laws, Statutes, Rules, & Regulations. Ignorance of the Laws does not constitute a valid excuse. The use of any illegal substances is prohibited. 10. Adopted CDC guidelines & recommendations: Target dosing levels will be at or below 60 MME/day. Use of benzodiazepines** is not recommended.  Exceptions: There are only two exceptions to the rule of not receiving pain medications from other Healthcare Providers. 1. Exception #1 (Emergencies): In the event of an emergency (i.e.: accident requiring emergency care), you are allowed to receive additional pain medication. However, you are responsible for: As soon as you are able, call our office [phone], at any time of the day or night, and leave a message stating your name, the date and nature of the emergency, and the name and dose of the medication prescribed. In the event that your call is answered by a member of our staff, make sure to document and save the date, time, and the name of the person that took your information.  2. Exception #2 (Planned Surgery): In the event that you are scheduled by another doctor or dentist to have any type of surgery   or procedure, you are allowed (for a period no longer than  30 days), to receive additional pain medication, for the acute post-op pain. However, in this case, you are responsible for picking up a copy of our "Post-op Pain Management for Surgeons" handout, and giving it to your surgeon or dentist. This document is available at our office, and does not require an appointment to obtain it. Simply go to our office during business hours (Monday-Thursday from 8:00 AM to 4:00 PM) (Friday 8:00 AM to 12:00 Noon) or if you have a scheduled appointment with us, prior to your surgery, and ask for it by name. In addition, you will need to provide us with your name, name of your surgeon, type of surgery, and date of procedure or surgery.  *Opioid medications include: morphine, codeine, oxycodone, oxymorphone, hydrocodone, hydromorphone, meperidine, tramadol, tapentadol, buprenorphine, fentanyl, methadone. **Benzodiazepine medications include: diazepam (Valium), alprazolam (Xanax), clonazepam (Klonopine), lorazepam (Ativan), clorazepate (Tranxene), chlordiazepoxide (Librium), estazolam (Prosom), oxazepam (Serax), temazepam (Restoril), triazolam (Halcion) (Last updated: 09/15/2017) ____________________________________________________________________________________________    

## 2017-11-24 ENCOUNTER — Ambulatory Visit (HOSPITAL_BASED_OUTPATIENT_CLINIC_OR_DEPARTMENT_OTHER): Payer: BLUE CROSS/BLUE SHIELD | Admitting: Pain Medicine

## 2017-11-24 ENCOUNTER — Other Ambulatory Visit: Payer: Self-pay

## 2017-11-24 ENCOUNTER — Encounter: Payer: Self-pay | Admitting: Pain Medicine

## 2017-11-24 ENCOUNTER — Ambulatory Visit
Admission: RE | Admit: 2017-11-24 | Discharge: 2017-11-24 | Disposition: A | Discharge status: Home / Self Care | Payer: BLUE CROSS/BLUE SHIELD | Source: Ambulatory Visit | Attending: Pain Medicine | Admitting: Pain Medicine

## 2017-11-24 VITALS — BP 159/91 | HR 60 | Temp 98.5°F | Resp 10 | Ht 71.0 in | Wt 260.0 lb

## 2017-11-24 DIAGNOSIS — M5441 Lumbago with sciatica, right side: Secondary | ICD-10-CM

## 2017-11-24 DIAGNOSIS — G8929 Other chronic pain: Secondary | ICD-10-CM | POA: Diagnosis present

## 2017-11-24 DIAGNOSIS — G8918 Other acute postprocedural pain: Secondary | ICD-10-CM | POA: Insufficient documentation

## 2017-11-24 DIAGNOSIS — M533 Sacrococcygeal disorders, not elsewhere classified: Secondary | ICD-10-CM

## 2017-11-24 DIAGNOSIS — M8938 Hypertrophy of bone, other site: Secondary | ICD-10-CM | POA: Diagnosis not present

## 2017-11-24 DIAGNOSIS — M5388 Other specified dorsopathies, sacral and sacrococcygeal region: Secondary | ICD-10-CM | POA: Insufficient documentation

## 2017-11-24 DIAGNOSIS — Z79899 Other long term (current) drug therapy: Secondary | ICD-10-CM | POA: Diagnosis not present

## 2017-11-24 DIAGNOSIS — M47816 Spondylosis without myelopathy or radiculopathy, lumbar region: Secondary | ICD-10-CM | POA: Insufficient documentation

## 2017-11-24 DIAGNOSIS — M5442 Lumbago with sciatica, left side: Secondary | ICD-10-CM | POA: Insufficient documentation

## 2017-11-24 DIAGNOSIS — Z882 Allergy status to sulfonamides status: Secondary | ICD-10-CM | POA: Diagnosis not present

## 2017-11-24 MED ORDER — HYDROCODONE-ACETAMINOPHEN 5-325 MG PO TABS: 1.0000 | ORAL_TABLET | Freq: Four times a day (QID) | ORAL | 0 refills | Status: AC | PRN

## 2017-11-24 MED ORDER — LIDOCAINE HCL 2 % IJ SOLN
20.0000 mL | Freq: Once | INTRAMUSCULAR | Status: AC
  Administered 2017-11-24: 200 mg
  Filled 2017-11-24: qty 40

## 2017-11-24 MED ORDER — FENTANYL CITRATE (PF) 100 MCG/2ML IJ SOLN
25.0000 ug | INTRAMUSCULAR | Status: DC | PRN
  Administered 2017-11-24: 100 ug via INTRAVENOUS
  Filled 2017-11-24: qty 2

## 2017-11-24 MED ORDER — MIDAZOLAM HCL 5 MG/5ML IJ SOLN
1.0000 mg | INTRAMUSCULAR | Status: DC | PRN
  Administered 2017-11-24: 5 mg via INTRAVENOUS
  Filled 2017-11-24: qty 5

## 2017-11-24 MED ORDER — ROPIVACAINE HCL 2 MG/ML IJ SOLN
9.0000 mL | Freq: Once | INTRAMUSCULAR | Status: AC
  Administered 2017-11-24: 9 mL via PERINEURAL
  Filled 2017-11-24: qty 10

## 2017-11-24 MED ORDER — TRIAMCINOLONE ACETONIDE 40 MG/ML IJ SUSP
40.0000 mg | Freq: Once | INTRAMUSCULAR | Status: AC
  Administered 2017-11-24: 40 mg
  Filled 2017-11-24: qty 1

## 2017-11-24 MED ORDER — LACTATED RINGERS IV SOLN
1000.0000 mL | Freq: Once | INTRAVENOUS | Status: AC
  Administered 2017-11-24: 1000 mL via INTRAVENOUS

## 2017-11-24 NOTE — Progress Notes (Signed)
Safety precautions to be maintained throughout the outpatient stay will include: orient to surroundings, keep bed in low position, maintain call bell within reach at all times, provide assistance with transfer out of bed and ambulation.  

## 2017-11-24 NOTE — Patient Instructions (Addendum)

## 2017-11-24 NOTE — Progress Notes (Signed)
Patient's Name: Nathan Lambert.  MRN: [id number]  Referring Provider: Delano Metz, MD  DOB: [birth year]  PCP: Nathan Robert, MD  DOS: 11/24/2017  Note by: Nathan Done, MD  Service setting: Ambulatory outpatient  Specialty: Interventional Pain Management  Patient type: Established  Location: [facility] (AMB) Pain Management Facility  Visit type: Interventional Procedure   Primary Reason for Visit: Interventional Pain Management Treatment. CC: Procedure and Back Pain  Procedure:       Anesthesia, Analgesia, Anxiolysis:  Type: Thermal Lumbar Facet, Medial Branch Radiofrequency Neurotomy Level: L2, L3, L4, L5, & S1 Medial Branch Level(s). These levels will denervate the L3-4, L4-5, and the L5-S1 lumbar facet joints. Primary Purpose: Therapeutic Region: Posterolateral Lumbosacral Spine Laterality: Left  Type: Moderate (Conscious) Sedation combined with Local Anesthesia Indication(s): Analgesia and Anxiety Route: Intravenous (IV) IV Access: Secured Sedation: Meaningful verbal contact was maintained at all times during the procedure  Local Anesthetic: Lidocaine 1-2%   Indications: 1. Spondylosis without myelopathy or radiculopathy, lumbar region   2. Lumbar facet syndrome (Bilateral) (R>L)   3. Lumbar facet hypertrophy (Bilateral)   4. Chronic low back pain (Primary Area of Pain) (Bilateral) (R>L)   5. Other specified dorsopathies, sacral and sacrococcygeal region   6. Chronic sacroiliac joint pain (Bilateral) (R>L)   7. Acute postoperative pain   8. Lumbar facet joint syndrome   9. Facet hypertrophy of lumbar region   10. Chronic bilateral low back pain with bilateral sciatica    Nathan Lambert has been dealing with the above chronic pain for longer than three months and has either failed to respond, was unable to tolerate, or simply did not get enough benefit from other more conservative therapies including, but not limited to: 1. Over-the-counter medications 2.  Anti-inflammatory medications 3. Muscle relaxants 4. Membrane stabilizers 5. Opioids 6. Physical therapy 7. Modalities (Heat, ice, etc.) 8. Invasive techniques such as nerve blocks. Nathan Lambert has attained more than 50% relief of the pain from a series of diagnostic injections conducted in separate occasions.  Pain Score: Pre-procedure: 2 /10 Post-procedure: 1 /10  Pre-op Assessment:  Nathan Lambert is a 37 y.o. (year old), male patient, seen today for interventional treatment. He  has a past surgical history that includes Fracture surgery (Left); Spine surgery (Right, 06/16/2015); and Back surgery. Nathan Lambert has a current medication list which includes the following prescription(s): gabapentin, loratadine, montelukast, naloxone, oxycodone hcl, oxycodone hcl, testosterone cypionate, viagra, and hydrocodone-acetaminophen, and the following Facility-Administered Medications: fentanyl and midazolam. His primarily concern today is the Procedure and Back Pain  Initial Vital Signs:  Pulse/HCG Rate: 62ECG Heart Rate: 65 Temp: 98.5 F (36.9 C) Resp: 17 BP: (!) 143/81 SpO2: 98 %  BMI: Estimated body mass index is 36.26 kg/m as calculated from the following:   Height as of this encounter: 5\' 11"  (1.803 m).   Weight as of this encounter: 260 lb (117.9 kg).  Risk Assessment: Allergies: Reviewed. He is allergic to bactrim [sulfamethoxazole-trimethoprim].  Allergy Precautions: None required Coagulopathies: Reviewed. None identified.  Blood-thinner therapy: None at this time Active Infection(s): Reviewed. None identified. Nathan Lambert is afebrile  Site Confirmation: Nathan Lambert was asked to confirm the procedure and laterality before marking the site Procedure checklist: Completed Consent: Before the procedure and under the influence of no sedative(s), amnesic(s), or anxiolytics, the patient was informed of the treatment options, risks and possible complications. To fulfill  our ethical and legal obligations, as recommended by the American Medical Association's Code of Ethics, I have  informed the patient of my clinical impression; the nature and purpose of the treatment or procedure; the risks, benefits, and possible complications of the intervention; the alternatives, including doing nothing; the risk(s) and benefit(s) of the alternative treatment(s) or procedure(s); and the risk(s) and benefit(s) of doing nothing. The patient was provided information about the general risks and possible complications associated with the procedure. These may include, but are not limited to: failure to achieve desired goals, infection, bleeding, organ or nerve damage, allergic reactions, paralysis, and death. In addition, the patient was informed of those risks and complications associated to Spine-related procedures, such as failure to decrease pain; infection (i.e.: Meningitis, epidural or intraspinal abscess); bleeding (i.e.: epidural hematoma, subarachnoid hemorrhage, or any other type of intraspinal or peri-dural bleeding); organ or nerve damage (i.e.: Any type of peripheral nerve, nerve root, or spinal cord injury) with subsequent damage to sensory, motor, and/or autonomic systems, resulting in permanent pain, numbness, and/or weakness of one or several areas of the body; allergic reactions; (i.e.: anaphylactic reaction); and/or death. Furthermore, the patient was informed of those risks and complications associated with the medications. These include, but are not limited to: allergic reactions (i.e.: anaphylactic or anaphylactoid reaction(s)); adrenal axis suppression; blood sugar elevation that in diabetics may result in ketoacidosis or comma; water retention that in patients with history of congestive heart failure may result in shortness of breath, pulmonary edema, and decompensation with resultant heart failure; weight gain; swelling or edema; medication-induced neural toxicity;  particulate matter embolism and blood vessel occlusion with resultant organ, and/or nervous system infarction; and/or aseptic necrosis of one or more joints. Finally, the patient was informed that Medicine is not an exact science; therefore, there is also the possibility of unforeseen or unpredictable risks and/or possible complications that may result in a catastrophic outcome. The patient indicated having understood very clearly. We have given the patient no guarantees and we have made no promises. Enough time was given to the patient to ask questions, all of which were answered to the patient's satisfaction. Mr. Ramus has indicated that he wanted to continue with the procedure. Attestation: I, the ordering provider, attest that I have discussed with the patient the benefits, risks, side-effects, alternatives, likelihood of achieving goals, and potential problems during recovery for the procedure that I have provided informed consent. Date  Time: 11/24/2017  1:55 PM  Pre-Procedure Preparation:  Monitoring: As per clinic protocol. Respiration, ETCO2, SpO2, BP, heart rate and rhythm monitor placed and checked for adequate function Safety Precautions: Patient was assessed for positional comfort and pressure points before starting the procedure. Time-out: I initiated and conducted the "Time-out" before starting the procedure, as per protocol. The patient was asked to participate by confirming the accuracy of the "Time Out" information. Verification of the correct person, site, and procedure were performed and confirmed by me, the nursing staff, and the patient. "Time-out" conducted as per Joint Commission's Universal Protocol (UP.01.01.01). Time: 1447  Description of Procedure:       Position: Prone Laterality: Left Levels:  L2, L3, L4, L5, & S1 Medial Branch Level(s), at the L3-4, L4-5, and the L5-S1 lumbar facet joints. Area Prepped: Lumbosacral Prepping solution: ChloraPrep (2% chlorhexidine  gluconate and 70% isopropyl alcohol) Safety Precautions: Aspiration looking for blood return was conducted prior to all injections. At no point did we inject any substances, as a needle was being advanced. Before injecting, the patient was told to immediately notify me if he was experiencing any new onset of "ringing in the  ears, or metallic taste in the mouth". No attempts were made at seeking any paresthesias. Safe injection practices and needle disposal techniques used. Medications properly checked for expiration dates. SDV (single dose vial) medications used. After the completion of the procedure, all disposable equipment used was discarded in the proper designated medical waste containers. Local Anesthesia: Protocol guidelines were followed. The patient was positioned over the fluoroscopy table. The area was prepped in the usual manner. The time-out was completed. The target area was identified using fluoroscopy. A 12-in long, straight, sterile hemostat was used with fluoroscopic guidance to locate the targets for each level blocked. Once located, the skin was marked with an approved surgical skin marker. Once all sites were marked, the skin (epidermis, dermis, and hypodermis), as well as deeper tissues (fat, connective tissue and muscle) were infiltrated with a small amount of a short-acting local anesthetic, loaded on a 10cc syringe with a 25G, 1.5-in  Needle. An appropriate amount of time was allowed for local anesthetics to take effect before proceeding to the next step. Local Anesthetic: Lidocaine 2.0% The unused portion of the local anesthetic was discarded in the proper designated containers. Technical explanation of process:  Radiofrequency Ablation (RFA) L2 Medial Branch Nerve RFA: The target area for the L2 medial branch is at the junction of the postero-lateral aspect of the superior articular process and the superior, posterior, and medial edge of the transverse process of L3. Under  fluoroscopic guidance, a Radiofrequency needle was inserted until contact was made with os over the superior postero-lateral aspect of the pedicular shadow (target area). Sensory and motor testing was conducted to properly adjust the position of the needle. Once satisfactory placement of the needle was achieved, the numbing solution was slowly injected after negative aspiration for blood. 2.0 mL of the nerve block solution was injected without difficulty or complication. After waiting for at least 3 minutes, the ablation was performed. Once completed, the needle was removed intact. L3 Medial Branch Nerve RFA: The target area for the L3 medial branch is at the junction of the postero-lateral aspect of the superior articular process and the superior, posterior, and medial edge of the transverse process of L4. Under fluoroscopic guidance, a Radiofrequency needle was inserted until contact was made with os over the superior postero-lateral aspect of the pedicular shadow (target area). Sensory and motor testing was conducted to properly adjust the position of the needle. Once satisfactory placement of the needle was achieved, the numbing solution was slowly injected after negative aspiration for blood. 2.0 mL of the nerve block solution was injected without difficulty or complication. After waiting for at least 3 minutes, the ablation was performed. Once completed, the needle was removed intact. L4 Medial Branch Nerve RFA: The target area for the L4 medial branch is at the junction of the postero-lateral aspect of the superior articular process and the superior, posterior, and medial edge of the transverse process of L5. Under fluoroscopic guidance, a Radiofrequency needle was inserted until contact was made with os over the superior postero-lateral aspect of the pedicular shadow (target area). Sensory and motor testing was conducted to properly adjust the position of the needle. Once satisfactory placement of the  needle was achieved, the numbing solution was slowly injected after negative aspiration for blood. 2.0 mL of the nerve block solution was injected without difficulty or complication. After waiting for at least 3 minutes, the ablation was performed. Once completed, the needle was removed intact. L5 Medial Branch Nerve RFA: The target area  for the L5 medial branch is at the junction of the postero-lateral aspect of the superior articular process of S1 and the superior, posterior, and medial edge of the sacral ala. Under fluoroscopic guidance, a Radiofrequency needle was inserted until contact was made with os over the superior postero-lateral aspect of the pedicular shadow (target area). Sensory and motor testing was conducted to properly adjust the position of the needle. Once satisfactory placement of the needle was achieved, the numbing solution was slowly injected after negative aspiration for blood. 2.0 mL of the nerve block solution was injected without difficulty or complication. After waiting for at least 3 minutes, the ablation was performed. Once completed, the needle was removed intact. S1 Medial Branch Nerve RFA: The target area for the S1 medial branch is located inferior to the junction of the S1 superior articular process and the L5 inferior articular process, posterior, inferior, and lateral to the 6 o'clock position of the L5-S1 facet joint, just superior to the S1 posterior foramen. Under fluoroscopic guidance, the Radiofrequency needle was advanced until contact was made with os over the Target area. Sensory and motor testing was conducted to properly adjust the position of the needle. Once satisfactory placement of the needle was achieved, the numbing solution was slowly injected after negative aspiration for blood. 2.0 mL of the nerve block solution was injected without difficulty or complication. After waiting for at least 3 minutes, the ablation was performed. Once completed, the needle was  removed intact. Radiofrequency lesioning (ablation):  Radiofrequency Generator: NeuroTherm [id number] Sensory Stimulation Parameters: 50 Hz was used to locate & identify the nerve, making sure that the needle was positioned such that there was no sensory stimulation below 0.3 V or above 0.7 V. Motor Stimulation Parameters: 2 Hz was used to evaluate the motor component. Care was taken not to lesion any nerves that demonstrated motor stimulation of the lower extremities at an output of less than 2.5 times that of the sensory threshold, or a maximum of 2.0 V. Lesioning Technique Parameters: Standard Radiofrequency settings. (Not bipolar or pulsed.) Temperature Settings: 80 degrees C Lesioning time: 60 seconds Intra-operative Compliance: Compliant Materials & Medications: Needle(s) (Electrode/Cannula) Type: Teflon-coated, curved tip, Radiofrequency needle(s) Gauge: 22G Length: 10cm Numbing solution: 0.2% PF-Ropivacaine + Triamcinolone (40 mg/mL) diluted to a final concentration of 4 mg of Triamcinolone/mL of Ropivacaine The unused portion of the solution was discarded in the proper designated containers.  Once the entire procedure was completed, the treated area was cleaned, making sure to leave some of the prepping solution back to take advantage of its long term bactericidal properties.  Illustration of the posterior view of the lumbar spine and the posterior neural structures. Laminae of L2 through S1 are labeled. DPRL5, dorsal primary ramus of L5; DPRS1, dorsal primary ramus of S1; DPR3, dorsal primary ramus of L3; FJ, facet (zygapophyseal) joint L3-L4; I, inferior articular process of L4; LB1, lateral branch of dorsal primary ramus of L1; IAB, inferior articular branches from L3 medial branch (supplies L4-L5 facet joint); IBP, intermediate branch plexus; MB3, medial branch of dorsal primary ramus of L3; NR3, third lumbar nerve root; S, superior articular process of L5; SAB, superior articular branches  from L4 (supplies L4-5 facet joint also); TP3, transverse process of L3.  Vitals:   11/24/17 1520 11/24/17 1525 11/24/17 1535 11/24/17 1547  BP: 128/86 (!) 143/81 (!) 158/86 (!) 159/91  Pulse: 60     Resp: Temp:      SpO2: 99%  100% 100% 100%  Weight:      Height:        Start Time: 1447 hrs. End Time: 1519 hrs.  Imaging Guidance (Spinal):  Type of Imaging Technique: Fluoroscopy Guidance (Spinal) Indication(s): Assistance in needle guidance and placement for procedures requiring needle placement in or near specific anatomical locations not easily accessible without such assistance. Exposure Time: Please see nurses notes. Contrast: None used. Fluoroscopic Guidance: I was personally present during the use of fluoroscopy. "Tunnel Vision Technique" used to obtain the best possible view of the target area. Parallax error corrected before commencing the procedure. "Direction-depth-direction" technique used to introduce the needle under continuous pulsed fluoroscopy. Once target was reached, antero-posterior, oblique, and lateral fluoroscopic projection used confirm needle placement in all planes. Images permanently stored in EMR. Interpretation: No contrast injected. I personally interpreted the imaging intraoperatively. Adequate needle placement confirmed in multiple planes. Permanent images saved into the patient's record.  Antibiotic Prophylaxis:   Anti-infectives (From admission, onward)   None     Indication(s): None identified  Post-operative Assessment:  Post-procedure Vital Signs:  Pulse/HCG Rate: 6064 Temp: 98.5 F (36.9 C) Resp: 10 BP: (!) 159/91 SpO2: 100 %  EBL: None  Complications: No immediate post-treatment complications observed by team, or reported by patient.  Note: The patient tolerated the entire procedure well. A repeat set of vitals were taken after the procedure and the patient was kept under observation following institutional policy, for this  type of procedure. Post-procedural neurological assessment was performed, showing return to baseline, prior to discharge. The patient was provided with post-procedure discharge instructions, including a section on how to identify potential problems. Should any problems arise concerning this procedure, the patient was given instructions to immediately contact us, at any time, without hesitation. In any case, we plan to contact the patient by telephone for a follow-up status report regarding this interventional procedure.  Comments:  No additional relevant information.  Plan of Care    Imaging Orders     DG C-Arm 1-60 Min-No Report  Procedure Orders     Radiofrequency,Lumbar  Medications ordered for procedure: Meds ordered this encounter  Medications  . lidocaine (XYLOCAINE) 2 % (with pres) injection 400 mg  . midazolam (VERSED) 5 MG/5ML injection 1-2 mg    Make sure Flumazenil is available in the pyxis when using this medication. If oversedation occurs, administer 0.2 mg IV over 15 sec. If after 45 sec no response, administer 0.2 mg again over 1 min; may repeat at 1 min intervals; not to exceed 4 doses (1 mg)  . fentaNYL (SUBLIMAZE) injection 25-50 mcg    Make sure Narcan is available in the pyxis when using this medication. In the event of respiratory depression (RR< 8/min): Titrate NARCAN (naloxone) in increments of 0.1 to 0.2 mg IV at 2-3 minute intervals, until desired degree of reversal.  . lactated ringers infusion 1,000 mL  . ropivacaine (PF) 2 mg/mL (0.2%) (NAROPIN) injection 9 mL  . triamcinolone acetonide (KENALOG-40) injection 40 mg  . HYDROcodone-acetaminophen (NORCO/VICODIN) 5-325 MG tablet    Sig: Take 1 tablet by mouth every 6 (six) hours as needed for up to 7 days for moderate pain.    Dispense:  28 tablet    Refill:  0    For acute post-operative pain. Not to be refilled. To last 7 days.   Medications administered: We administered lidocaine, midazolam, fentaNYL,  lactated ringers, ropivacaine (PF) 2 mg/mL (0.2%), and triamcinolone acetonide.  See the medical record for exact  dosing, route, and time of administration.  New Prescriptions   No medications on file   Disposition: Discharge home  Discharge Date & Time: 11/24/2017; 1545 hrs.   Physician-requested Follow-up: Return for Post-RFA eval (6 wks), w/ Thad Ranger, NP.  Future Appointments  Date Time Provider Department Center  12/22/2017  9:45 AM Barbette Merino, NP [facility] None   Primary Care Physician: Nathan Robert, MD Location: [facility] Note by: Nathan Done, MD Date: 11/24/2017; Time: 4:48 PM  Disclaimer:  Medicine is not an Visual merchandiser. The only guarantee in medicine is that nothing is guaranteed. It is important to note that the decision to proceed with this intervention was based on the information collected from the patient. The Data and conclusions were drawn from the patient's questionnaire, the interview, and the physical examination. Because the information was provided in large part by the patient, it cannot be guaranteed that it has not been purposely or unconsciously manipulated. Every effort has been made to obtain as much relevant data as possible for this evaluation. It is important to note that the conclusions that lead to this procedure are derived in large part from the available data. Always take into account that the treatment will also be dependent on availability of resources and existing treatment guidelines, considered by other Pain Management Practitioners as being common knowledge and practice, at the time of the intervention. For Medico-Legal purposes, it is also important to point out that variation in procedural techniques and pharmacological choices are the acceptable norm. The indications, contraindications, technique, and results of the above procedure should only be interpreted and judged by a Board-Certified Interventional  Pain Specialist with extensive familiarity and expertise in the same exact procedure and technique.

## 2017-11-25 ENCOUNTER — Telehealth: Payer: Self-pay

## 2017-11-25 NOTE — Telephone Encounter (Signed)
Post procedure phone call.  Wife states he is doing OK.

## 2017-12-22 ENCOUNTER — Ambulatory Visit: Payer: BLUE CROSS/BLUE SHIELD | Attending: Nurse Practitioner | Admitting: Nurse Practitioner

## 2017-12-22 ENCOUNTER — Other Ambulatory Visit: Payer: Self-pay

## 2017-12-22 ENCOUNTER — Encounter: Payer: Self-pay | Admitting: Nurse Practitioner

## 2017-12-22 VITALS — BP 120/80 | HR 61 | Temp 98.2°F | Resp 14 | Ht 71.0 in | Wt 265.0 lb

## 2017-12-22 DIAGNOSIS — M545 Low back pain: Secondary | ICD-10-CM | POA: Diagnosis present

## 2017-12-22 DIAGNOSIS — M533 Sacrococcygeal disorders, not elsewhere classified: Secondary | ICD-10-CM | POA: Diagnosis not present

## 2017-12-22 DIAGNOSIS — Z79891 Long term (current) use of opiate analgesic: Secondary | ICD-10-CM | POA: Diagnosis not present

## 2017-12-22 DIAGNOSIS — G44011 Episodic cluster headache, intractable: Secondary | ICD-10-CM | POA: Diagnosis not present

## 2017-12-22 DIAGNOSIS — M9983 Other biomechanical lesions of lumbar region: Secondary | ICD-10-CM | POA: Diagnosis not present

## 2017-12-22 DIAGNOSIS — M792 Neuralgia and neuritis, unspecified: Secondary | ICD-10-CM

## 2017-12-22 DIAGNOSIS — Z79899 Other long term (current) drug therapy: Secondary | ICD-10-CM | POA: Insufficient documentation

## 2017-12-22 DIAGNOSIS — M47816 Spondylosis without myelopathy or radiculopathy, lumbar region: Secondary | ICD-10-CM | POA: Diagnosis not present

## 2017-12-22 DIAGNOSIS — M4807 Spinal stenosis, lumbosacral region: Secondary | ICD-10-CM | POA: Diagnosis not present

## 2017-12-22 DIAGNOSIS — M5136 Other intervertebral disc degeneration, lumbar region: Secondary | ICD-10-CM | POA: Diagnosis not present

## 2017-12-22 DIAGNOSIS — F17210 Nicotine dependence, cigarettes, uncomplicated: Secondary | ICD-10-CM | POA: Insufficient documentation

## 2017-12-22 DIAGNOSIS — E559 Vitamin D deficiency, unspecified: Secondary | ICD-10-CM | POA: Insufficient documentation

## 2017-12-22 DIAGNOSIS — G894 Chronic pain syndrome: Secondary | ICD-10-CM | POA: Insufficient documentation

## 2017-12-22 DIAGNOSIS — K5900 Constipation, unspecified: Secondary | ICD-10-CM | POA: Insufficient documentation

## 2017-12-22 DIAGNOSIS — M48061 Spinal stenosis, lumbar region without neurogenic claudication: Secondary | ICD-10-CM | POA: Insufficient documentation

## 2017-12-22 MED ORDER — GABAPENTIN 300 MG PO CAPS: 300.0000 mg | ORAL_CAPSULE | Freq: Three times a day (TID) | ORAL | 1 refills | Status: DC

## 2017-12-22 MED ORDER — OXYCODONE HCL 10 MG PO TABS: 10.0000 mg | ORAL_TABLET | Freq: Four times a day (QID) | ORAL | 0 refills | Status: DC | PRN

## 2017-12-22 NOTE — Progress Notes (Signed)
Nursing Pain Medication Assessment:  Safety precautions to be maintained throughout the outpatient stay will include: orient to surroundings, keep bed in low position, maintain call bell within reach at all times, provide assistance with transfer out of bed and ambulation.  Medication Inspection Compliance: Pill count conducted under aseptic conditions, in front of the patient. Neither the pills nor the bottle was removed from the patient's sight at any time. Once count was completed pills were immediately returned to the patient in their original bottle.  Medication: Oxycodone IR Pill/Patch Count: 91 of 120 pills remain Pill/Patch Appearance: Markings consistent with prescribed medication Bottle Appearance: Standard pharmacy container. Clearly labeled. Filled Date: 05/28 / 2019 Last Medication intake:  Yesterday

## 2017-12-22 NOTE — Patient Instructions (Addendum)
____________________________________________________________________________________________  Medication Rules  Applies to: All patients receiving prescriptions (written or electronic).  Pharmacy of record: Pharmacy where electronic prescriptions will be sent. If written prescriptions are taken to a different pharmacy, please inform the nursing staff. The pharmacy listed in the electronic medical record should be the one where you would like electronic prescriptions to be sent.  Prescription refills: Only during scheduled appointments. Applies to both, written and electronic prescriptions.  NOTE: The following applies primarily to controlled substances (Opioid* Pain Medications).   Patient's responsibilities: 1. Pain Pills: Bring all pain pills to every appointment (except for procedure appointments). 2. Pill Bottles: Bring pills in original pharmacy bottle. Always bring newest bottle. Bring bottle, even if empty. 3. Medication refills: You are responsible for knowing and keeping track of what medications you need refilled. The day before your appointment, write a list of all prescriptions that need to be refilled. Bring that list to your appointment and give it to the admitting nurse. Prescriptions will be written only during appointments. If you forget a medication, it will not be "Called in", "Faxed", or "electronically sent". You will need to get another appointment to get these prescribed. 4. Prescription Accuracy: You are responsible for carefully inspecting your prescriptions before leaving our office. Have the discharge nurse carefully go over each prescription with you, before taking them home. Make sure that your name is accurately spelled, that your address is correct. Check the name and dose of your medication to make sure it is accurate. Check the number of pills, and the written instructions to make sure they are clear and accurate. Make sure that you are given enough medication to last  until your next medication refill appointment. 5. Taking Medication: Take medication as prescribed. Never take more pills than instructed. Never take medication more frequently than prescribed. Taking less pills or less frequently is permitted and encouraged, when it comes to controlled substances (written prescriptions).  6. Inform other Doctors: Always inform, all of your healthcare providers, of all the medications you take. 7. Pain Medication from other Providers: You are not allowed to accept any additional pain medication from any other Doctor or Healthcare provider. There are two exceptions to this rule. (see below) In the event that you require additional pain medication, you are responsible for notifying us, as stated below. 8. Medication Agreement: You are responsible for carefully reading and following our Medication Agreement. This must be signed before receiving any prescriptions from our practice. Safely store a copy of your signed Agreement. Violations to the Agreement will result in no further prescriptions. (Additional copies of our Medication Agreement are available upon request.) 9. Laws, Rules, & Regulations: All patients are expected to follow all Federal and State Laws, Statutes, Rules, & Regulations. Ignorance of the Laws does not constitute a valid excuse. The use of any illegal substances is prohibited. 10. Adopted CDC guidelines & recommendations: Target dosing levels will be at or below 60 MME/day. Use of benzodiazepines** is not recommended.  Exceptions: There are only two exceptions to the rule of not receiving pain medications from other Healthcare Providers. 1. Exception #1 (Emergencies): In the event of an emergency (i.e.: accident requiring emergency care), you are allowed to receive additional pain medication. However, you are responsible for: As soon as you are able, call our office [phone], at any time of the day or night, and leave a message stating your name, the  date and nature of the emergency, and the name and dose of the medication   prescribed. In the event that your call is answered by a member of our staff, make sure to document and save the date, time, and the name of the person that took your information.  2. Exception #2 (Planned Surgery): In the event that you are scheduled by another doctor or dentist to have any type of surgery or procedure, you are allowed (for a period no longer than 30 days), to receive additional pain medication, for the acute post-op pain. However, in this case, you are responsible for picking up a copy of our "Post-op Pain Management for Surgeons" handout, and giving it to your surgeon or dentist. This document is available at our office, and does not require an appointment to obtain it. Simply go to our office during business hours (Monday-Thursday from 8:00 AM to 4:00 PM) (Friday 8:00 AM to 12:00 Noon) or if you have a scheduled appointment with us, prior to your surgery, and ask for it by name. In addition, you will need to provide us with your name, name of your surgeon, type of surgery, and date of procedure or surgery.  *Opioid medications include: morphine, codeine, oxycodone, oxymorphone, hydrocodone, hydromorphone, meperidine, tramadol, tapentadol, buprenorphine, fentanyl, methadone. **Benzodiazepine medications include: diazepam (Valium), alprazolam (Xanax), clonazepam (Klonopine), lorazepam (Ativan), clorazepate (Tranxene), chlordiazepoxide (Librium), estazolam (Prosom), oxazepam (Serax), temazepam (Restoril), triazolam (Halcion) (Last updated: 09/15/2017) ____________________________________________________________________________________________    BMI Assessment: Estimated body mass index is 36.96 kg/m as calculated from the following:   Height as of this encounter: 5\' 11"  (1.803 m).   Weight as of this encounter: 265 lb (120.2 kg).  BMI interpretation table: BMI level Category Range association with higher  incidence of chronic pain  <18 kg/m2 Underweight   18.5-24.9 kg/m2 Ideal body weight   25-29.9 kg/m2 Overweight Increased incidence by 20%  30-34.9 kg/m2 Obese (Class I) Increased incidence by 68%  35-39.9 kg/m2 Severe obesity (Class II) Increased incidence by 136%  >40 kg/m2 Extreme obesity (Class III) Increased incidence by 254%   Patient's current BMI Ideal Body weight  Body mass index is 36.96 kg/m. Ideal body weight: 75.3 kg (166 lb 0.1 oz) Adjusted ideal body weight: 93.3 kg (205 lb 9.7 oz)   BMI Readings from Last 4 Encounters:  12/22/17 36.96 kg/m  11/24/17 36.26 kg/m  10/25/17 37.66 kg/m  10/11/17 37.66 kg/m   Wt Readings from Last 4 Encounters:  12/22/17 265 lb (120.2 kg)  11/24/17 260 lb (117.9 kg)  10/25/17 270 lb (122.5 kg)  10/11/17 270 lb (122.5 kg)   You were given 2 prescriptions for Oxycodone and one for Gabapentin today.

## 2017-12-22 NOTE — Progress Notes (Signed)
Patient's Name: Nathan Lambert.  MRN: [id number]  Referring Provider: Vidal Schwalbe, MD  DOB: May 14, 1981  PCP: Vidal Schwalbe, MD  DOS: 12/22/2017  Note by: Vevelyn Francois NP  Service setting: Ambulatory outpatient  Specialty: Interventional Pain Management  Location: [facility] (AMB) Pain Management Facility    Patient type: Established    Primary Reason(s) for Visit: Encounter for prescription drug management & post-procedure evaluation of chronic illness with mild to moderate exacerbation(Level of risk: moderate) CC: Back Pain (lower)  HPI  Nathan Lambert is a 37 y.o. year old, male patient, who comes today for a post-procedure evaluation and medication management. 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 His primarily concern today is the Back Pain (lower)  Pain Assessment: Location: Lower Back Radiating: denies Onset: More than a month ago Duration: Chronic pain Quality: Aching, Pressure Severity: 3 /10 (subjective, self-reported pain score)  Note: Reported level is compatible with observation.                          Effect on  ADL:   Timing: Constant Modifying factors: medications, Biofeeeze BP: 120/80  HR: 61  Nathan Lambert was last seen on 11/25/2017 for a procedure. During today's appointment we reviewed Nathan Lambert post-procedure results, as well as his outpatient medication regimen.  Further details on both, my assessment(s), as well as the proposed treatment plan, please see below.  Controlled Substance Pharmacotherapy Assessment REMS (Risk Evaluation and Mitigation Strategy)  Analgesic:oxycodone 10 mg 4 times daily MME/day:50m/day.    WLandis Martins RN  12/22/2017 10:22 AM  Sign at close encounter Nursing Pain Medication Assessment:  Safety precautions to be maintained throughout the outpatient stay will include: orient to surroundings, keep bed in low position, maintain call bell within reach at all times, provide assistance with transfer out of bed and ambulation.  Medication Inspection Compliance: Pill count conducted under aseptic conditions, in front of the patient. Neither the pills nor the bottle was removed from the patient's sight at any time. Once count was completed pills were immediately returned to the patient in their original bottle.  Medication: Oxycodone IR Pill/Patch Count: 91 of 120 pills remain Pill/Patch Appearance: Markings consistent with prescribed medication Bottle Appearance: Standard pharmacy container. Clearly labeled. Filled Date: 05/28 / 2019 Last Medication intake:  Yesterday   Pharmacokinetics: Liberation and absorption (onset of action): WNL Distribution (time to peak effect): WNL Metabolism and excretion (duration of action): WNL         Pharmacodynamics: Desired effects: Analgesia: Mr. CLefeversreports >50% benefit. Functional ability: Patient reports that medication allows him to accomplish basic ADLs Clinically meaningful improvement in function (CMIF): Sustained CMIF goals met Perceived effectiveness: Described as relatively effective,  allowing for increase in activities of daily living (ADL) Undesirable effects: Side-effects or Adverse reactions: None reported Monitoring: [place]  PMP: Online review of the past 49-monthperiod conducted. Compliant with practice rules and regulations Last UDS on record: Summary  Date Value Ref Range Status  06/08/2017 FINAL  Final    Comment:    ==================================================================== TOXASSURE SELECT 13 (MW) ==================================================================== Test                             Result       Flag       Units Drug Present and Declared for Prescription Verification   Oxycodone                      1000         EXPECTED   ng/mg creat   Oxymorphone                    1590         EXPECTED   ng/mg creat   Noroxycodone                   892          EXPECTED   ng/mg creat   Noroxymorphone                 370          EXPECTED   ng/mg creat    Sources of oxycodone are scheduled prescription medications.    Oxymorphone, noroxycodone, and noroxymorphone are expected    metabolites of oxycodone. Oxymorphone is also available as a    scheduled prescription medication. ==================================================================== Test                      Result    Flag   Units      Ref Range   Creatinine              200              mg/dL      >=20 ==================================================================== Declared Medications:  The flagging and interpretation on this report are based on the  following declared medications.  Unexpected results may arise from  inaccuracies in the declared medications.  **Note: The testing scope of this panel includes these medications:  Oxycodone  **Note: The testing scope of this panel does not include following  reported medications:  Gabapentin  Loratadine  Montelukast  Naloxone  Sildenafil ==================================================================== For clinical  consultation, please call ([phone] ====================================================================    UDS interpretation: Compliant          Medication Assessment Form: Reviewed. Patient indicates being compliant with therapy Treatment compliance: Compliant Risk Assessment Profile: Aberrant behavior: See prior evaluations. None observed or detected today Comorbid factors increasing risk of overdose: See prior notes. No additional risks detected today Risk of substance use disorder (SUD): Low Opioid Risk Tool - 10/25/17 1037      Family History of Substance Abuse   Alcohol  Negative    Illegal Drugs  Negative    Rx Drugs  Negative      Personal History of Substance Abuse   Alcohol  Negative    Illegal Drugs  Negative    Rx Drugs  Negative      History of Preadolescent Sexual Abuse   History of Preadolescent Sexual Abuse  Negative or Male      Psychological Disease   Psychological Disease  Negative    Depression  Negative  Total Score   Opioid Risk Tool Scoring  0    Opioid Risk Interpretation  Low Risk      ORT Scoring interpretation table:  Score <3 = Low Risk for SUD  Score between 4-7 = Moderate Risk for SUD  Score >8 = High Risk for Opioid Abuse   Risk Mitigation Strategies:  Patient Counseling: Covered Patient-Prescriber Agreement (PPA): Present and active  Notification to other healthcare providers: Done  Pharmacologic Plan: No change in therapy, at this time.             Post-Procedure Assessment  11/24/2017 Procedure: Left Lumbar RFA Pre-procedure pain score:  2/10 Post-procedure pain score: 1/10         Influential Factors: BMI: 36.96 kg/m Intra-procedural challenges: None observed.         Assessment challenges: None detected.              Reported side-effects: None.        Post-procedural adverse reactions or complications: None reported         Sedation: Please see nurses note. When no sedatives are used, the analgesic levels  obtained are directly associated to the effectiveness of the local anesthetics. However, when sedation is provided, the level of analgesia obtained during the initial 1 hour following the intervention, is believed to be the result of a combination of factors. These factors may include, but are not limited to: 1. The effectiveness of the local anesthetics used. 2. The effects of the analgesic(s) and/or anxiolytic(s) used. 3. The degree of discomfort experienced by the patient at the time of the procedure. 4. The patients ability and reliability in recalling and recording the events. 5. The presence and influence of possible secondary gains and/or psychosocial factors. Reported result: Relief experienced during the 1st hour after the procedure: 0 % (Ultra-Short Term Relief)            Interpretative annotation: Clinically appropriate result. Analgesia during this period is likely to be Local Anesthetic and/or IV Sedative (Analgesic/Anxiolytic) related.          Effects of local anesthetic: The analgesic effects attained during this period are directly associated to the localized infiltration of local anesthetics and therefore cary significant diagnostic value as to the etiological location, or anatomical origin, of the pain. Expected duration of relief is directly dependent on the pharmacodynamics of the local anesthetic used. Long-acting (4-6 hours) anesthetics used.  Reported result: Relief during the next 4 to 6 hour after the procedure: 0 % (Short-Term Relief)            Interpretative annotation: Clinically appropriate result. Analgesia during this period is likely to be Local Anesthetic-related.          Long-term benefit: Defined as the period of time past the expected duration of local anesthetics (1 hour for short-acting and 4-6 hours for long-acting). With the possible exception of prolonged sympathetic blockade from the local anesthetics, benefits during this period are typically attributed to,  or associated with, other factors such as analgesic sensory neuropraxia, antiinflammatory effects, or beneficial biochemical changes provided by agents other than the local anesthetics.  Reported result: Extended relief following procedure: 0 % (Long-Term Relief)            Interpretative annotation: Clinically appropriate result. Good relief. No permanent benefit expected. Inflammation plays a part in the etiology to the pain.          Current benefits: Defined as reported results that persistent at this point  in time.   Analgesia: 0-25 %            Function: No improvement at this time only 4 week post RFA  ROM: No improvement Interpretative annotation: Recurrence of symptoms. No permanent benefit expected. Effective diagnostic intervention.          Interpretation: Results would suggest non-involvement of the tested area.                  Plan:  Please see "Plan of Care" for details.                Laboratory Chemistry  Inflammation Markers (CRP: Acute Phase) (ESR: Chronic Phase) Lab Results  Component Value Date   CRP 8.5 (H) 12/27/2016   ESRSEDRATE 35 (H) 12/27/2016                         Rheumatology Markers No results found for: RF, ANA, LABURIC, URICUR, LYMEIGGIGMAB, LYMEABIGMQN, HLAB27                      Renal Function Markers Lab Results  Component Value Date   BUN 6 12/27/2016   CREATININE 0.83 12/27/2016   BCR 7 (L) 12/27/2016   GFRAA 133 12/27/2016   GFRNONAA 115 12/27/2016                              Hepatic Function Markers Lab Results  Component Value Date   AST 11 12/27/2016   ALT 15 12/27/2016   ALBUMIN 4.2 12/27/2016   ALKPHOS 56 12/27/2016                        Electrolytes Lab Results  Component Value Date   NA 144 12/27/2016   K 4.3 12/27/2016   CL 106 12/27/2016   CALCIUM 9.0 12/27/2016   MG 2.1 12/27/2016                        Neuropathy Markers Lab Results  Component Value Date   VITAMINB12 462 12/27/2016                         Bone Pathology Markers Lab Results  Component Value Date   25OHVITD1 17 (L) 12/27/2016   25OHVITD2 <1.0 12/27/2016   25OHVITD3 17 12/27/2016   TESTOFREE 2.6 (L) 06/08/2017   TESTOSTERONE 116 (L) 06/08/2017                         Coagulation Parameters No results found for: INR, LABPROT, APTT, PLT, DDIMER                      Cardiovascular Markers No results found for: BNP, CKTOTAL, CKMB, TROPONINI, HGB, HCT                       CA Markers No results found for: CEA, CA125, LABCA2                      Note: Lab results reviewed.  Recent Diagnostic Imaging Results  DG C-Arm 1-60 Min-No Report Fluoroscopy was utilized by the requesting physician.  No radiographic  interpretation.   Complexity Note: Imaging results reviewed. Results shared with Mr. Georgiou, using Layman's terms.  Meds   Current Outpatient Medications:  .  [START ON 01/12/2018] gabapentin (NEURONTIN) 300 MG capsule, Take 1-3 capsules (300-900 mg total) by mouth 3 (three) times daily. Follow written titration schedule., Disp: 270 capsule, Rfl: 1 .  loratadine (CLARITIN) 10 MG tablet, Take 10 mg by mouth daily as needed for allergies., Disp: , Rfl:  .  montelukast (SINGULAIR) 10 MG tablet, Take 10 mg by mouth at bedtime., Disp: , Rfl:  .  naloxone (NARCAN) 2 MG/2ML injection, Inject content of syringe into thigh muscle. Call 911., Disp: 2 Syringe, Rfl: 1 .  [START ON 02/11/2018] Oxycodone HCl 10 MG TABS, Take 1 tablet (10 mg total) by mouth every 6 (six) hours as needed., Disp: 120 tablet, Rfl: 0 .  testosterone cypionate (DEPOTESTOSTERONE CYPIONATE) 200 MG/ML injection, Inject into the muscle every 7 (seven) days., Disp: , Rfl:  .  VIAGRA 100 MG tablet, Take 1 tablet (100 mg total) by mouth as needed for erectile dysfunction., Disp: 20 tablet, Rfl: 1 .  [START ON 01/12/2018] Oxycodone HCl 10 MG TABS, Take 1 tablet (10 mg total) by mouth every 6 (six) hours as needed., Disp: 120 tablet,  Rfl: 0  ROS  Constitutional: Denies any fever or chills Gastrointestinal: No reported hemesis, hematochezia, vomiting, or acute GI distress Musculoskeletal: Denies any acute onset joint swelling, redness, loss of ROM, or weakness Neurological: No reported episodes of acute onset apraxia, aphasia, dysarthria, agnosia, amnesia, paralysis, loss of coordination, or loss of consciousness  Allergies  Mr. Heidelberg is allergic to bactrim [sulfamethoxazole-trimethoprim].  PFSH  Drug: Mr. Chilson  reports that he does not use drugs. Alcohol:  reports that he does not drink alcohol. Tobacco:  reports that he has been smoking cigarettes.  He has a 10.00 pack-year smoking history. He has never used smokeless tobacco. Medical:  has a past medical history of Allergy (2012), Chronic low back pain (Primary Area of Pain) (Bilateral) (R>L), Degeneration of lumbar or lumbosacral intervertebral disc (L4-L5) (12/27/2016), Headache, and Opiate use (02/15/2017). Surgical: Mr. Baruch  has a past surgical history that includes Fracture surgery (Left); Spine surgery (Right, 06/16/2015); and Back surgery. Family: family history includes AAA (abdominal aortic aneurysm) in his mother; Diabetes in his father; Stroke in his mother.  Constitutional Exam  General appearance: Well nourished, well developed, and well hydrated. In no apparent acute distress Vitals:   12/22/17 1017  BP: 120/80  Pulse: 61  Resp: 14  Temp: 98.2 F (36.8 C)  TempSrc: Oral  SpO2: 100%  Weight: 265 lb (120.2 kg)  Height: _0  (1.803 m)  Psych/Mental status: Alert, oriented x 3 (person, place, & time)       Eyes: PERLA Respiratory: No evidence of acute respiratory distress  Lumbar Spine Area Exam  Skin & Axial Inspection: Well healed scar from previous spine surgery detected Alignment: Symmetrical Functional ROM: Decreased ROM       Stability: No instability detected Muscle Tone/Strength: Functionally intact. No obvious  neuro-muscular anomalies detected. Sensory (Neurological): Unimpaired Palpation: Non-tender       Provocative Tests: Lumbar Hyperextension/rotation test: Non-contributory       Lumbar quadrant test (Kemp's test): deferred today       Lumbar Lateral bending test: Non-contributory       Patrick's Maneuver: deferred today                   FABER test: deferred today       Thigh-thrust test: deferred today       S-I  compression test: deferred today       S-I distraction test: deferred today        Gait & Posture Assessment  Ambulation: Unassisted Gait: Relatively normal for age and body habitus Posture: WNL   Lower Extremity Exam    Side: Right lower extremity  Side: Left lower extremity  Stability: No instability observed          Stability: No instability observed          Skin & Extremity Inspection: Skin color, temperature, and hair growth are WNL. No peripheral edema or cyanosis. No masses, redness, swelling, asymmetry, or associated skin lesions. No contractures.  Skin & Extremity Inspection: Skin color, temperature, and hair growth are WNL. No peripheral edema or cyanosis. No masses, redness, swelling, asymmetry, or associated skin lesions. No contractures.  Functional ROM: Unrestricted ROM                  Functional ROM: Unrestricted ROM                  Muscle Tone/Strength: Functionally intact. No obvious neuro-muscular anomalies detected.  Muscle Tone/Strength: Functionally intact. No obvious neuro-muscular anomalies detected.  Sensory (Neurological): Unimpaired  Sensory (Neurological): Unimpaired  Palpation: No palpable anomalies  Palpation: No palpable anomalies   Assessment  Primary Diagnosis & Pertinent Problem List: The primary encounter diagnosis was Spondylosis without myelopathy or radiculopathy, lumbar region. 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  Status Diagnosis  Persistent Persistent Persistent 1. Spondylosis without myelopathy or radiculopathy, lumbar region   2. Lumbar facet syndrome (Bilateral) (R>L)   3. 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 Chronic pain syndrome   5. Long term (current) use of opiate analgesic   6. Neurogenic pain     Problems updated and reviewed during this visit: No problems updated. Plan of Care  Pharmacotherapy (Medications Ordered): Meds ordered this encounter  Medications  . gabapentin (NEURONTIN) 300 MG capsule    Sig: Take 1-3 capsules (300-900 mg total) by mouth 3 (three) times daily. Follow written titration schedule.    Dispense:  270 capsule    Refill:  1    Do not place medication on "Automatic Refill". Fill one day early if pharmacy is closed on scheduled refill date.    Order Specific Question:   Supervising Provider    Answer:   Milinda Pointer [phone]  . Oxycodone HCl 10 MG TABS    Sig: Take 1 tablet (10 mg total) by mouth every 6 (six) hours as needed.    Dispense:  120 tablet    Refill:  0    Do not place this medication, or any other prescription from our practice, on "Automatic Refill". Patient may have prescription filled one day early if pharmacy is closed on scheduled refill date. Do not fill until: 02/11/2018 To last until:03/13/2018    Order Specific Question:   Supervising Provider    Answer:   Milinda Pointer [phone]  . Oxycodone HCl 10 MG TABS    Sig: Take 1 tablet (10 mg total) by mouth every 6 (six) hours as needed.    Dispense:  120 tablet    Refill:  0    Do not place this medication, or any other prescription from our practice, on "Automatic Refill". Patient may have prescription filled one day early if  pharmacy is closed on scheduled refill date. Do not fill until:01/12/2018 To last until:02/11/2018    Order Specific Question:   Supervising Provider    Answer:    Milinda Pointer [phone]   New Prescriptions   No medications on file   Medications administered today: Fumio L. Marion Jr. had no medications administered during this visit. Lab-work, procedure(s), and/or referral(s): Orders Placed This Encounter  Procedures  . ToxASSURE Select 13 (MW), Urine   Imaging and/or referral(s): None  Interventional therapies: Planned, scheduled, and/or pending: Not at this time   Considering:  Diagnostic bilateral lumbar facet block Possible bilateral lumbar facet RFA Diagnostic bilateral sacroiliac joint block Possible bilateral sacroiliac joint RFA Diagnostic caudal epidural steroid injection + diagnostic epidurogram Possible Racz procedure Diagnostic left intra-articular knee injection with local anesthetic and steroid Possible left series of 5 intra-articular Hyalgan knee injections Diagnostic left Genicular nerve block Possible left Genicular nerve RFA   Palliative PRN treatment(s):  None at this time      Provider-requested follow-up: Return in about 2 months (around 03/08/2018) for MedMgmt with Me Donella Stade Edison Pace).  Future Appointments  Date Time Provider [place]  02/21/2018 11:15 AM Vevelyn Francois, NP [facility] None   Primary Care Physician: Vidal Schwalbe, MD Location: [facility] Out[facility] Management Facility Note by: Vevelyn Francois NP Date: 12/22/2017; Time: 11:02 AM  Pain Score Disclaimer: We use the NRS-11 scale. This is a self-reported, subjective measurement of pain severity with only modest accuracy. It is used primarily to identify changes within a particular patient. It must be understood that outpatient pain scales are significantly less accurate that those used for research, where they can be applied under ideal controlled circumstances with minimal exposure to variables. In reality, the score is likely to be a combination of pain intensity and pain affect, where pain affect describes the  degree of emotional arousal or changes in action readiness caused by the sensory experience of pain. Factors such as social and work situation, setting, emotional state, anxiety levels, expectation, and prior pain experience may influence pain perception and show large inter-individual differences that may also be affected by time variables.  Patient instructions provided during this appointment: Patient Instructions   ____________________________________________________________________________________________  Medication Rules  Applies to: All patients receiving prescriptions (written or electronic).  Pharmacy of record: Pharmacy where electronic prescriptions will be sent. If written prescriptions are taken to a different pharmacy, please inform the nursing staff. The pharmacy listed in the electronic medical record should be the one where you would like electronic prescriptions to be sent.  Prescription refills: Only during scheduled appointments. Applies to both, written and electronic prescriptions.  NOTE: The following applies primarily to controlled substances (Opioid* Pain Medications).   Patient's responsibilities: 1. Pain Pills: Bring all pain pills to every appointment (except for procedure appointments). 2. Pill Bottles: Bring pills in original pharmacy bottle. Always bring newest bottle. Bring bottle, even if empty. 3. Medication refills: You are responsible for knowing and keeping track of what medications you need refilled. The day before your appointment, write a list of all prescriptions that need to be refilled. Bring that list to your appointment and give it to the admitting nurse. Prescriptions will be written only during appointments. If you forget a medication, it will not be "Called in", "Faxed", or "electronically sent". You will need to get another appointment to get these prescribed. 4. Prescription Accuracy: You are responsible for carefully inspecting your  prescriptions before leaving our office. Have the discharge nurse  carefully go over each prescription with you, before taking them home. Make sure that your name is accurately spelled, that your address is correct. Check the name and dose of your medication to make sure it is accurate. Check the number of pills, and the written instructions to make sure they are clear and accurate. Make sure that you are given enough medication to last until your next medication refill appointment. 5. Taking Medication: Take medication as prescribed. Never take more pills than instructed. Never take medication more frequently than prescribed. Taking less pills or less frequently is permitted and encouraged, when it comes to controlled substances (written prescriptions).  6. Inform other Doctors: Always inform, all of your healthcare providers, of all the medications you take. 7. Pain Medication from other Providers: You are not allowed to accept any additional pain medication from any other Doctor or Healthcare provider. There are two exceptions to this rule. (see below) In the event that you require additional pain medication, you are responsible for notifying us, as stated below. 8. Medication Agreement: You are responsible for carefully reading and following our Medication Agreement. This must be signed before receiving any prescriptions from our practice. Safely store a copy of your signed Agreement. Violations to the Agreement will result in no further prescriptions. (Additional copies of our Medication Agreement are available upon request.) 9. Laws, Rules, & Regulations: All patients are expected to follow all Federal and [employer], [employer], Rules, [employer]. Ignorance of the Laws does not constitute a valid excuse. The use of any illegal substances is prohibited. 10. Adopted CDC guidelines & recommendations: Target dosing levels will be at or below 60 MME/day. Use of benzodiazepines** is not  recommended.  Exceptions: There are only two exceptions to the rule of not receiving pain medications from other Healthcare Providers. 1. Exception #1 (Emergencies): In the event of an emergency (i.e.: accident requiring emergency care), you are allowed to receive additional pain medication. However, you are responsible for: As soon as you are able, call our office [phone], at any time of the day or night, and leave a message stating your name, the date and nature of the emergency, and the name and dose of the medication prescribed. In the event that your call is answered by a member of our staff, make sure to document and save the date, time, and the name of the person that took your information.  2. Exception #2 (Planned Surgery): In the event that you are scheduled by another doctor or dentist to have any type of surgery or procedure, you are allowed (for a period no longer than 30 days), to receive additional pain medication, for the acute post-op pain. However, in this case, you are responsible for picking up a copy of our "Post-op Pain Management for Surgeons" handout, and giving it to your surgeon or dentist. This document is available at our office, and does not require an appointment to obtain it. Simply go to our office during business hours (Monday-Thursday from 8:00 AM to 4:00 PM) (Friday 8:00 AM to 12:00 Noon) or if you have a scheduled appointment with [place], prior to your surgery, and ask for it by name. In addition, you will need to provide [place] with your name, name of your surgeon, type of surgery, and date of procedure or surgery.  *Opioid medications include: morphine, codeine, oxycodone, oxymorphone, hydrocodone, hydromorphone, meperidine, tramadol, tapentadol, buprenorphine, fentanyl, methadone. **Benzodiazepine medications include: diazepam (Valium), alprazolam (Xanax), clonazepam (Klonopine), lorazepam (Ativan), clorazepate (Tranxene), chlordiazepoxide (Librium), estazolam (  Prosom),  oxazepam (Serax), temazepam (Restoril), triazolam (Halcion) (Last updated: 09/15/2017) ____________________________________________________________________________________________    BMI Assessment: Estimated body mass index is 36.96 kg/m as calculated from the following:   Height as of this encounter: _0  (1.803 m).   Weight as of this encounter: 265 lb (120.2 kg).  BMI interpretation table: BMI level Category Range association with higher incidence of chronic pain  <18 kg/m2 Underweight   18.5-24.9 kg/m2 Ideal body weight   25-29.9 kg/m2 Overweight Increased incidence by 20%  30-34.9 kg/m2 Obese (Class I) Increased incidence by 68%  35-39.9 kg/m2 Severe obesity (Class II) Increased incidence by 136%  >40 kg/m2 Extreme obesity (Class III) Increased incidence by 254%   Patient's current BMI Ideal Body weight  Body mass index is 36.96 kg/m. Ideal body weight: 75.3 kg (166 lb 0.1 oz) Adjusted ideal body weight: 93.3 kg (205 lb 9.7 oz)   BMI Readings from Last 4 Encounters:  12/22/17 36.96 kg/m  11/24/17 36.26 kg/m  10/25/17 37.66 kg/m  10/11/17 37.66 kg/m   Wt Readings from Last 4 Encounters:  12/22/17 265 lb (120.2 kg)  11/24/17 260 lb (117.9 kg)  10/25/17 270 lb (122.5 kg)  10/11/17 270 lb (122.5 kg)   You were given 2 prescriptions for Oxycodone and one for Gabapentin today.

## 2017-12-26 ENCOUNTER — Ambulatory Visit: Payer: BLUE CROSS/BLUE SHIELD | Admitting: Nurse Practitioner

## 2017-12-27 ENCOUNTER — Ambulatory Visit: Payer: BLUE CROSS/BLUE SHIELD | Admitting: Nurse Practitioner

## 2017-12-27 LAB — TOXASSURE SELECT 13 (MW), URINE

## 2018-02-21 ENCOUNTER — Ambulatory Visit: Payer: BLUE CROSS/BLUE SHIELD | Attending: Nurse Practitioner | Admitting: Nurse Practitioner

## 2018-02-21 ENCOUNTER — Other Ambulatory Visit: Payer: Self-pay

## 2018-02-21 ENCOUNTER — Encounter: Payer: Self-pay | Admitting: Nurse Practitioner

## 2018-02-21 VITALS — BP 136/81 | HR 81 | Temp 98.4°F | Resp 16 | Ht 71.0 in | Wt 260.0 lb

## 2018-02-21 DIAGNOSIS — M5136 Other intervertebral disc degeneration, lumbar region: Secondary | ICD-10-CM | POA: Diagnosis not present

## 2018-02-21 DIAGNOSIS — Z79891 Long term (current) use of opiate analgesic: Secondary | ICD-10-CM

## 2018-02-21 DIAGNOSIS — M47816 Spondylosis without myelopathy or radiculopathy, lumbar region: Secondary | ICD-10-CM | POA: Diagnosis not present

## 2018-02-21 DIAGNOSIS — M5137 Other intervertebral disc degeneration, lumbosacral region: Secondary | ICD-10-CM | POA: Diagnosis not present

## 2018-02-21 DIAGNOSIS — G894 Chronic pain syndrome: Secondary | ICD-10-CM

## 2018-02-21 MED ORDER — OXYCODONE HCL 10 MG PO TABS: 10.0000 mg | ORAL_TABLET | Freq: Four times a day (QID) | ORAL | 0 refills | Status: DC | PRN

## 2018-02-21 NOTE — Progress Notes (Addendum)
Patient's Name: Nathan Lambert.  MRN: [id number]  Referring Provider: Vidal Schwalbe, MD  DOB: Dec 25, 1980  PCP: Vidal Schwalbe, MD  DOS: 02/21/2018  Note by: Vevelyn Francois NP  Service setting: Ambulatory outpatient  Specialty: Interventional Pain Management  Location: [facility] (AMB) Pain Management Facility    Patient type: Established    Primary Reason(s) for Visit: Encounter for prescription drug management. (Level of risk: moderate)  CC: Back Pain (lower)  HPI  Nathan Lambert is a 37 y.o. year old, male patient, who comes today for a medication management evaluation. 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 His primarily concern today is the Back Pain (lower)  Pain Assessment: Location: Lower Back Duration: Chronic pain Severity: 4 /10 (subjective, self-reported pain score)  Note: Reported level is compatible with observation.                          BP: 136/81  HR: 81  Nathan Lambert was last scheduled for an appointment on 12/22/2017 for medication management. During today's appointment we reviewed Mr.  Lambert chronic pain status, as well as his outpatient medication regimen. He is concern about his future and this pain.    The patient  reports that he does not use drugs. His body mass index is 36.26 kg/m.  Further details on both, my assessment(s), as well as the proposed treatment plan, please see below.  Controlled Substance Pharmacotherapy Assessment REMS (Risk Evaluation and Mitigation Strategy)  Analgesic:oxycodone 10 mg 4 times daily MME/day:72m/day. WRise Patience RN  02/21/2018 11:44 AM  Signed Nursing Pain Medication Assessment:  Safety precautions to be maintained throughout the outpatient stay will include: orient to surroundings, keep bed in low position, maintain call bell within reach at all times, provide assistance with transfer out of bed and ambulation.  Medication Inspection Compliance: Pill count conducted under aseptic conditions, in front of the patient. Neither the pills nor the bottle was removed from the patient's sight at any time. Once count was completed pills were immediately returned to the patient in their original bottle.  Medication: Oxycodone IR Pill/Patch Count: 87 of 120 pills remain Pill/Patch Appearance: Markings consistent with prescribed medication Bottle Appearance: Standard pharmacy container. Clearly labeled. Filled Date: 7 / 29 / 2019 Last Medication intake:  Today   Pharmacokinetics: Liberation and absorption (onset of action): WNL Distribution (time to peak effect): WNL Metabolism and excretion (duration of action): WNL         Pharmacodynamics: Desired effects: Analgesia: Mr. CPetreyreports >50% benefit. Functional ability: Patient reports that medication allows him to accomplish basic ADLs Clinically meaningful improvement in function (CMIF): Sustained CMIF goals met Perceived effectiveness: Described as relatively effective, allowing for increase in activities of daily living (ADL) Undesirable effects: Side-effects or  Adverse reactions: None reported Monitoring: [place] PMP: Online review  of the past 9-monthperiod conducted. Compliant with practice rules and regulations Last UDS on record: Summary  Date Value Ref Range Status  12/22/2017 FINAL  Final    Comment:    ==================================================================== TOXASSURE SELECT 13 (MW) ==================================================================== Test                             Result       Flag       Units Drug Present   Oxycodone                      356                     ng/mg creat   Oxymorphone                    808                     ng/mg creat   Noroxycodone                   661                     ng/mg creat   Noroxymorphone                 255                     ng/mg creat    Sources of oxycodone are scheduled prescription medications.    Oxymorphone, noroxycodone, and noroxymorphone are expected    metabolites of oxycodone. Oxymorphone is also available as a    scheduled prescription medication. ==================================================================== Test                      Result    Flag   Units      Ref Range   Creatinine              236              mg/dL      >=20 ==================================================================== Declared Medications:  Medication list was not provided. ==================================================================== For clinical consultation, please call [phone] ====================================================================    UDS interpretation: Compliant          Medication Assessment Form: Reviewed. Patient indicates being compliant with therapy Treatment compliance: Compliant Risk Assessment Profile: Aberrant behavior: See prior evaluations. None observed or detected today Comorbid factors increasing risk of overdose: See prior notes. No additional risks detected today Risk of substance use disorder (SUD): Low Opioid  Risk Tool - 02/21/18 1139      Family History of Substance Abuse   Alcohol  Positive Male    Illegal Drugs  Positive Male    Rx Drugs  Positive Male or Male      Personal History of Substance Abuse   Alcohol  Positive Male or Male    Illegal Drugs  Positive Male or Male    Rx Drugs  Positive Male or Male      Age   Age between 143-45years   Yes      History of Preadolescent Sexual Abuse   History of Preadolescent Sexual Abuse  Negative or Male      Psychological Disease   Psychological Disease  Negative    Depression  Negative      Total Score  Opioid Risk Tool Scoring  23    Opioid Risk Interpretation  High Risk      ORT Scoring interpretation table:  Score <3 = Low Risk for SUD  Score between 4-7 = Moderate Risk for SUD  Score >8 = High Risk for Opioid Abuse   Risk Mitigation Strategies:  Patient Counseling: Covered Patient-Prescriber Agreement (PPA): Present and active  Notification to other healthcare providers: Done  Pharmacologic Plan: No change in therapy, at this time.             Laboratory Chemistry  Inflammation Markers (CRP: Acute Phase) (ESR: Chronic Phase) Lab Results  Component Value Date   CRP 8.5 (H) 12/27/2016   ESRSEDRATE 35 (H) 12/27/2016                         Rheumatology Markers No results found for: RF, ANA, LABURIC, URICUR, LYMEIGGIGMAB, LYMEABIGMQN, HLAB27                      Renal Function Markers Lab Results  Component Value Date   BUN 6 12/27/2016   CREATININE 0.83 12/27/2016   BCR 7 (L) 12/27/2016   GFRAA 133 12/27/2016   GFRNONAA 115 12/27/2016                             Hepatic Function Markers Lab Results  Component Value Date   AST 11 12/27/2016   ALT 15 12/27/2016   ALBUMIN 4.2 12/27/2016   ALKPHOS 56 12/27/2016                        Electrolytes Lab Results  Component Value Date   NA 144 12/27/2016   K 4.3 12/27/2016   CL 106 12/27/2016   CALCIUM 9.0 12/27/2016   MG 2.1 12/27/2016                         Neuropathy Markers Lab Results  Component Value Date   VITAMINB12 462 12/27/2016                        Bone Pathology Markers Lab Results  Component Value Date   25OHVITD1 17 (L) 12/27/2016   25OHVITD2 <1.0 12/27/2016   25OHVITD3 17 12/27/2016   TESTOFREE 2.6 (L) 06/08/2017   TESTOSTERONE 116 (L) 06/08/2017                         Coagulation Parameters No results found for: INR, LABPROT, APTT, PLT, DDIMER                      Cardiovascular Markers No results found for: BNP, CKTOTAL, CKMB, TROPONINI, HGB, HCT                       CA Markers No results found for: CEA, CA125, LABCA2                      Note: Lab results reviewed.  Recent Diagnostic Imaging Results  DG C-Arm 1-60 Min-No Report Fluoroscopy was utilized by the requesting physician.  No radiographic  interpretation.   Complexity Note: Imaging results reviewed. Results shared with Mr. Chestnut, using Layman's terms.  Meds   Current Outpatient Medications:  .  gabapentin (NEURONTIN) 300 MG capsule, Take 1-3 capsules (300-900 mg total) by mouth 3 (three) times daily. Follow written titration schedule., Disp: 270 capsule, Rfl: 1 .  loratadine (CLARITIN) 10 MG tablet, Take 10 mg by mouth daily as needed for allergies., Disp: , Rfl:  .  montelukast (SINGULAIR) 10 MG tablet, Take 10 mg by mouth at bedtime., Disp: , Rfl:  .  naloxone (NARCAN) 2 MG/2ML injection, Inject content of syringe into thigh muscle. Call 911., Disp: 2 Syringe, Rfl: 1 .  [START ON 04/14/2018] Oxycodone HCl 10 MG TABS, Take 1 tablet (10 mg total) by mouth every 6 (six) hours as needed., Disp: 120 tablet, Rfl: 0 .  testosterone cypionate (DEPOTESTOSTERONE CYPIONATE) 200 MG/ML injection, Inject into the muscle every 7 (seven) days., Disp: , Rfl:  .  VIAGRA 100 MG tablet, Take 1 tablet (100 mg total) by mouth as needed for erectile dysfunction., Disp: 20 tablet, Rfl: 1 .  [START ON 03/15/2018] Oxycodone HCl  10 MG TABS, Take 1 tablet (10 mg total) by mouth every 6 (six) hours as needed., Disp: 120 tablet, Rfl: 0  ROS  Constitutional: Denies any fever or chills Gastrointestinal: No reported hemesis, hematochezia, vomiting, or acute GI distress Musculoskeletal: Denies any acute onset joint swelling, redness, loss of ROM, or weakness Neurological: No reported episodes of acute onset apraxia, aphasia, dysarthria, agnosia, amnesia, paralysis, loss of coordination, or loss of consciousness  Allergies  Mr. Azzara is allergic to bactrim [sulfamethoxazole-trimethoprim].  PFSH  Drug: Mr. Rochford  reports that he does not use drugs. Alcohol:  reports that he does not drink alcohol. Tobacco:  reports that he has been smoking cigarettes.  He has a 10.00 pack-year smoking history. He has never used smokeless tobacco. Medical:  has a past medical history of Allergy (2012), Chronic low back pain (Primary Area of Pain) (Bilateral) (R>L), Degeneration of lumbar or lumbosacral intervertebral disc (L4-L5) (12/27/2016), Headache, and Opiate use (02/15/2017). Surgical: Mr. Lipkin  has a past surgical history that includes Fracture surgery (Left); Spine surgery (Right, 06/16/2015); and Back surgery. Family: family history includes AAA (abdominal aortic aneurysm) in his mother; Diabetes in his father; Stroke in his mother.  Constitutional Exam  General appearance: Well nourished, well developed, and well hydrated. In no apparent acute distress Vitals:   02/21/18 1133  BP: 136/81  Pulse: 81  Resp: 16  Temp: 98.4 F (36.9 C)  TempSrc: Oral  SpO2: 98%  Weight: 260 lb (117.9 kg)  Height: 5' 11"  (1.803 m)  Psych/Mental status: Alert, oriented x 3 (person, place, & time)       Eyes: PERLA Respiratory: No evidence of acute respiratory distress  Lumbar Spine Area Exam  Skin & Axial Inspection: Well healed scar from previous spine surgery detected Alignment: Symmetrical Functional ROM: Unrestricted ROM        Stability: No instability detected Muscle Tone/Strength: Functionally intact. No obvious neuro-muscular anomalies detected. Sensory (Neurological): Unimpaired Palpation: Complains of area being tender to palpation       Provocative Tests: Hyperextension/rotation test: deferred today       Lumbar quadrant test (Kemp's test): deferred today       Lateral bending test: deferred today       Patrick's Maneuver: deferred today                    Gait & Posture Assessment  Ambulation: Unassisted Gait: Relatively normal for age and body habitus Posture: WNL  Lower Extremity Exam    Side: Right lower extremity  Side: Left lower extremity  Stability: No instability observed          Stability: No instability observed          Skin & Extremity Inspection: Skin color, temperature, and hair growth are WNL. No peripheral edema or cyanosis. No masses, redness, swelling, asymmetry, or associated skin lesions. No contractures.  Skin & Extremity Inspection: Skin color, temperature, and hair growth are WNL. No peripheral edema or cyanosis. No masses, redness, swelling, asymmetry, or associated skin lesions. No contractures.  Functional ROM: Unrestricted ROM                  Functional ROM: Unrestricted ROM                  Muscle Tone/Strength: Functionally intact. No obvious neuro-muscular anomalies detected.  Muscle Tone/Strength: Functionally intact. No obvious neuro-muscular anomalies detected.  Sensory (Neurological): Unimpaired  Sensory (Neurological): Unimpaired  Palpation: No palpable anomalies  Palpation: No palpable anomalies   Assessment  Primary Diagnosis & Pertinent Problem List: The primary encounter diagnosis was Lumbar spondylosis. Diagnoses of DDD (degenerative disc disease), lumbar, Degeneration of lumbar or lumbosacral intervertebral disc (L4-L5), Long term (current) use of opiate analgesic, and Chronic pain syndrome were also pertinent to this visit.  Status Diagnosis   Persistent Persistent Persistent 1. Lumbar spondylosis   2. DDD (degenerative disc disease), lumbar   3. Degeneration of lumbar or lumbosacral intervertebral disc (L4-L5)   4. Long term (current) use of opiate analgesic   5. Chronic pain syndrome     Problems updated and reviewed during this visit: Problem  Lumbar Spondylosis   Plan of Care  Pharmacotherapy (Medications Ordered): Meds ordered this encounter  Medications  . Oxycodone HCl 10 MG TABS    Sig: Take 1 tablet (10 mg total) by mouth every 6 (six) hours as needed.    Dispense:  120 tablet    Refill:  0    Do not place this medication, or any other prescription from our practice, on "Automatic Refill". Patient may have prescription filled one day early if pharmacy is closed on scheduled refill date. Do not fill until:04/14/2018 To last until:05/14/2018    Order Specific Question:   Supervising Provider    Answer:   Milinda Pointer [phone]  . Oxycodone HCl 10 MG TABS    Sig: Take 1 tablet (10 mg total) by mouth every 6 (six) hours as needed.    Dispense:  120 tablet    Refill:  0    Do not place this medication, or any other prescription from our practice, on "Automatic Refill". Patient may have prescription filled one day early if pharmacy is closed on scheduled refill date. Do not fill until:03/15/2018 To last until:04/14/2018    Order Specific Question:   Supervising Provider    Answer:   Milinda Pointer [phone]   New Prescriptions   No medications on file   Medications administered today: Stacey L. Mccance Jr. had no medications administered during this visit. Lab-work, procedure(s), and/or referral(s): No orders of the defined types were placed in this encounter.  Imaging and/or referral(s): None  Interventional therapies: Planned, scheduled, and/or pending:   Not at this time.   Provider-requested follow-up: Return in about 3 months (around 05/24/2018) for MedMgmt with Me Donella Stade  Edison Pace).  Future Appointments  Date Time Provider [place]  05/04/2018 10:30 AM Vevelyn Francois, NP [facility] None   Primary Care  Physician: Vidal Schwalbe, MD Location: [facility] Note by: Vevelyn Francois NP Date: 02/21/2018; Time: 4:09 PM  Pain Score Disclaimer: We use the NRS-11 scale. This is a self-reported, subjective measurement of pain severity with only modest accuracy. It is used primarily to identify changes within a particular patient. It must be understood that outpatient pain scales are significantly less accurate that those used for research, where they can be applied under ideal controlled circumstances with minimal exposure to variables. In reality, the score is likely to be a combination of pain intensity and pain affect, where pain affect describes the degree of emotional arousal or changes in action readiness caused by the sensory experience of pain. Factors such as social and work situation, setting, emotional state, anxiety levels, expectation, and prior pain experience may influence pain perception and show large inter-individual differences that may also be affected by time variables.  Patient instructions provided during this appointment: Patient Instructions   You have been given 2 Rx for Oxycodone to last until 05/14/2018. ____________________________________________________________________________________________  Medication Rules  Applies to: All patients receiving prescriptions (written or electronic).  Pharmacy of record: Pharmacy where electronic prescriptions will be sent. If written prescriptions are taken to a different pharmacy, please inform the nursing staff. The pharmacy listed in the electronic medical record should be the one where you would like electronic prescriptions to be sent.  Prescription refills: Only during scheduled appointments. Applies to both, written and electronic prescriptions.  NOTE: The following  applies primarily to controlled substances (Opioid* Pain Medications).   Patient's responsibilities: 1. Pain Pills: Bring all pain pills to every appointment (except for procedure appointments). 2. Pill Bottles: Bring pills in original pharmacy bottle. Always bring newest bottle. Bring bottle, even if empty. 3. Medication refills: You are responsible for knowing and keeping track of what medications you need refilled. The day before your appointment, write a list of all prescriptions that need to be refilled. Bring that list to your appointment and give it to the admitting nurse. Prescriptions will be written only during appointments. If you forget a medication, it will not be "Called in", "Faxed", or "electronically sent". You will need to get another appointment to get these prescribed. 4. Prescription Accuracy: You are responsible for carefully inspecting your prescriptions before leaving our office. Have the discharge nurse carefully go over each prescription with you, before taking them home. Make sure that your name is accurately spelled, that your address is correct. Check the name and dose of your medication to make sure it is accurate. Check the number of pills, and the written instructions to make sure they are clear and accurate. Make sure that you are given enough medication to last until your next medication refill appointment. 5. Taking Medication: Take medication as prescribed. Never take more pills than instructed. Never take medication more frequently than prescribed. Taking less pills or less frequently is permitted and encouraged, when it comes to controlled substances (written prescriptions).  6. Inform other Doctors: Always inform, all of your healthcare providers, of all the medications you take. 7. Pain Medication from other Providers: You are not allowed to accept any additional pain medication from any other Doctor or Healthcare provider. There are two exceptions to this rule. (see  below) In the event that you require additional pain medication, you are responsible for notifying us, as stated below. 8. Medication Agreement: You are responsible for carefully reading and following our Medication Agreement. This must be signed before receiving any prescriptions  from our practice. Safely store a copy of your signed Agreement. Violations to the Agreement will result in no further prescriptions. (Additional copies of our Medication Agreement are available upon request.) 9. Laws, Rules, & Regulations: All patients are expected to follow all Federal and [employer], [employer], Rules, [employer]. Ignorance of the Laws does not constitute a valid excuse. The use of any illegal substances is prohibited. 10. Adopted CDC guidelines & recommendations: Target dosing levels will be at or below 60 MME/day. Use of benzodiazepines** is not recommended.  Exceptions: There are only two exceptions to the rule of not receiving pain medications from other Healthcare Providers. 1. Exception #1 (Emergencies): In the event of an emergency (i.e.: accident requiring emergency care), you are allowed to receive additional pain medication. However, you are responsible for: As soon as you are able, call our office [phone], at any time of the day or night, and leave a message stating your name, the date and nature of the emergency, and the name and dose of the medication prescribed. In the event that your call is answered by a member of our staff, make sure to document and save the date, time, and the name of the person that took your information.  2. Exception #2 (Planned Surgery): In the event that you are scheduled by another doctor or dentist to have any type of surgery or procedure, you are allowed (for a period no longer than 30 days), to receive additional pain medication, for the acute post-op pain. However, in this case, you are responsible for picking up a copy of our "Post-op Pain Management for  Surgeons" handout, and giving it to your surgeon or dentist. This document is available at our office, and does not require an appointment to obtain it. Simply go to our office during business hours (Monday-Thursday from 8:00 AM to 4:00 PM) (Friday 8:00 AM to 12:00 Noon) or if you have a scheduled appointment with [place], prior to your surgery, and ask for it by name. In addition, you will need to provide [place] with your name, name of your surgeon, type of surgery, and date of procedure or surgery.  *Opioid medications include: morphine, codeine, oxycodone, oxymorphone, hydrocodone, hydromorphone, meperidine, tramadol, tapentadol, buprenorphine, fentanyl, methadone. **Benzodiazepine medications include: diazepam (Valium), alprazolam (Xanax), clonazepam (Klonopine), lorazepam (Ativan), clorazepate (Tranxene), chlordiazepoxide (Librium), estazolam (Prosom), oxazepam (Serax), temazepam (Restoril), triazolam (Halcion) (Last updated: 09/15/2017) ____________________________________________________________________________________________   BMI Assessment: Estimated body mass index is 36.26 kg/m as calculated from the following:   Height as of this encounter: 5' 11"  (1.803 m).   Weight as of this encounter: 260 lb (117.9 kg).  BMI interpretation table: BMI level Category Range association with higher incidence of chronic pain  <18 kg/m2 Underweight   18.5-24.9 kg/m2 Ideal body weight   25-29.9 kg/m2 Overweight Increased incidence by 20%  30-34.9 kg/m2 Obese (Class I) Increased incidence by 68%  35-39.9 kg/m2 Severe obesity (Class II) Increased incidence by 136%  >40 kg/m2 Extreme obesity (Class III) Increased incidence by 254%   Patient's current BMI Ideal Body weight  Body mass index is 36.26 kg/m. Ideal body weight: 75.3 kg (166 lb 0.1 oz) Adjusted ideal body weight: 92.4 kg (203 lb 9.7 oz)   BMI Readings from Last 4 Encounters:  02/21/18 36.26 kg/m  12/22/17 36.96 kg/m  11/24/17 36.26 kg/m   10/25/17 37.66 kg/m   Wt Readings from Last 4 Encounters:  02/21/18 260 lb (117.9 kg)  12/22/17 265 lb (120.2 kg)  11/24/17 260 lb (  117.9 kg)  10/25/17 270 lb (122.5 kg)

## 2018-02-21 NOTE — Patient Instructions (Addendum)
You have been given 2 Rx for Oxycodone to last until 05/14/2018. ____________________________________________________________________________________________  Medication Rules  Applies to: All patients receiving prescriptions (written or electronic).  Pharmacy of record: Pharmacy where electronic prescriptions will be sent. If written prescriptions are taken to a different pharmacy, please inform the nursing staff. The pharmacy listed in the electronic medical record should be the one where you would like electronic prescriptions to be sent.  Prescription refills: Only during scheduled appointments. Applies to both, written and electronic prescriptions.  NOTE: The following applies primarily to controlled substances (Opioid* Pain Medications).   Patient's responsibilities: 1. Pain Pills: Bring all pain pills to every appointment (except for procedure appointments). 2. Pill Bottles: Bring pills in original pharmacy bottle. Always bring newest bottle. Bring bottle, even if empty. 3. Medication refills: You are responsible for knowing and keeping track of what medications you need refilled. The day before your appointment, write a list of all prescriptions that need to be refilled. Bring that list to your appointment and give it to the admitting nurse. Prescriptions will be written only during appointments. If you forget a medication, it will not be "Called in", "Faxed", or "electronically sent". You will need to get another appointment to get these prescribed. 4. Prescription Accuracy: You are responsible for carefully inspecting your prescriptions before leaving our office. Have the discharge nurse carefully go over each prescription with you, before taking them home. Make sure that your name is accurately spelled, that your address is correct. Check the name and dose of your medication to make sure it is accurate. Check the number of pills, and the written instructions to make sure they are clear and  accurate. Make sure that you are given enough medication to last until your next medication refill appointment. 5. Taking Medication: Take medication as prescribed. Never take more pills than instructed. Never take medication more frequently than prescribed. Taking less pills or less frequently is permitted and encouraged, when it comes to controlled substances (written prescriptions).  6. Inform other Doctors: Always inform, all of your healthcare providers, of all the medications you take. 7. Pain Medication from other Providers: You are not allowed to accept any additional pain medication from any other Doctor or Healthcare provider. There are two exceptions to this rule. (see below) In the event that you require additional pain medication, you are responsible for notifying us, as stated below. 8. Medication Agreement: You are responsible for carefully reading and following our Medication Agreement. This must be signed before receiving any prescriptions from our practice. Safely store a copy of your signed Agreement. Violations to the Agreement will result in no further prescriptions. (Additional copies of our Medication Agreement are available upon request.) 9. Laws, Rules, & Regulations: All patients are expected to follow all [address]Federal and [place] Disney[place] Laws, [employer]utes, Rules, [employer] Regulations. Ignorance of the Laws does not constitute a valid excuse. The use of any illegal substances is prohibited. 10. Adopted CDC guidelines & recommendations: Target dosing levels will be at or below 60 MME/day. Use of benzodiazepines** is not recommended.  Exceptions: There are only two exceptions to the rule of not receiving pain medications from other Healthcare Providers. 1. Exception #1 (Emergencies): In the event of an emergency (i.e.: accident requiring emergency care), you are allowed to receive additional pain medication. However, you are responsible for: As soon as you are able, call our office [phone], at any  time of the day or night, and leave a message stating your name, the date and  nature of the emergency, and the name and dose of the medication prescribed. In the event that your call is answered by a member of our staff, make sure to document and save the date, time, and the name of the person that took your information.  2. Exception #2 (Planned Surgery): In the event that you are scheduled by another doctor or dentist to have any type of surgery or procedure, you are allowed (for a period no longer than 30 days), to receive additional pain medication, for the acute post-op pain. However, in this case, you are responsible for picking up a copy of our "Post-op Pain Management for Surgeons" handout, and giving it to your surgeon or dentist. This document is available at our office, and does not require an appointment to obtain it. Simply go to our office during business hours (Monday-Thursday from 8:00 AM to 4:00 PM) (Friday 8:00 AM to 12:00 Noon) or if you have a scheduled appointment with [place], prior to your surgery, and ask for it by name. In addition, you will need to provide [place] with your name, name of your surgeon, type of surgery, and date of procedure or surgery.  *Opioid medications include: morphine, codeine, oxycodone, oxymorphone, hydrocodone, hydromorphone, meperidine, tramadol, tapentadol, buprenorphine, fentanyl, methadone. **Benzodiazepine medications include: diazepam (Valium), alprazolam (Xanax), clonazepam (Klonopine), lorazepam (Ativan), clorazepate (Tranxene), chlordiazepoxide (Librium), estazolam (Prosom), oxazepam (Serax), temazepam (Restoril), triazolam (Halcion) (Last updated: 09/15/2017) ____________________________________________________________________________________________   BMI Assessment: Estimated body mass index is 36.26 kg/m as calculated from the following:   Height as of this encounter: 5\' 11"  (1.803 m).   Weight as of this encounter: 260 lb (117.9 kg).  BMI  interpretation table: BMI level Category Range association with higher incidence of chronic pain  <18 kg/m2 Underweight   18.5-24.9 kg/m2 Ideal body weight   25-29.9 kg/m2 Overweight Increased incidence by 20%  30-34.9 kg/m2 Obese (Class I) Increased incidence by 68%  35-39.9 kg/m2 Severe obesity (Class II) Increased incidence by 136%  >40 kg/m2 Extreme obesity (Class III) Increased incidence by 254%   Patient's current BMI Ideal Body weight  Body mass index is 36.26 kg/m. Ideal body weight: 75.3 kg (166 lb 0.1 oz) Adjusted ideal body weight: 92.4 kg (203 lb 9.7 oz)   BMI Readings from Last 4 Encounters:  02/21/18 36.26 kg/m  12/22/17 36.96 kg/m  11/24/17 36.26 kg/m  10/25/17 37.66 kg/m   Wt Readings from Last 4 Encounters:  02/21/18 260 lb (117.9 kg)  12/22/17 265 lb (120.2 kg)  11/24/17 260 lb (117.9 kg)  10/25/17 270 lb (122.5 kg)

## 2018-02-21 NOTE — Progress Notes (Signed)
Nursing Pain Medication Assessment:  Safety precautions to be maintained throughout the outpatient stay will include: orient to surroundings, keep bed in low position, maintain call bell within reach at all times, provide assistance with transfer out of bed and ambulation.  Medication Inspection Compliance: Pill count conducted under aseptic conditions, in front of the patient. Neither the pills nor the bottle was removed from the patient's sight at any time. Once count was completed pills were immediately returned to the patient in their original bottle.  Medication: Oxycodone IR Pill/Patch Count: 87 of 120 pills remain Pill/Patch Appearance: Markings consistent with prescribed medication Bottle Appearance: Standard pharmacy container. Clearly labeled. Filled Date: 7 / 29 / 2019 Last Medication intake:  Today

## 2018-05-04 ENCOUNTER — Ambulatory Visit: Payer: BLUE CROSS/BLUE SHIELD | Attending: Nurse Practitioner | Admitting: Nurse Practitioner

## 2018-05-04 ENCOUNTER — Encounter: Payer: Self-pay | Admitting: Nurse Practitioner

## 2018-05-04 ENCOUNTER — Other Ambulatory Visit: Payer: Self-pay

## 2018-05-04 VITALS — BP 135/89 | HR 68 | Temp 98.4°F | Resp 18 | Ht 71.0 in | Wt 270.0 lb

## 2018-05-04 DIAGNOSIS — N539 Unspecified male sexual dysfunction: Secondary | ICD-10-CM | POA: Diagnosis not present

## 2018-05-04 DIAGNOSIS — F17210 Nicotine dependence, cigarettes, uncomplicated: Secondary | ICD-10-CM | POA: Insufficient documentation

## 2018-05-04 DIAGNOSIS — M549 Dorsalgia, unspecified: Secondary | ICD-10-CM | POA: Diagnosis present

## 2018-05-04 DIAGNOSIS — G894 Chronic pain syndrome: Secondary | ICD-10-CM | POA: Diagnosis not present

## 2018-05-04 DIAGNOSIS — Z882 Allergy status to sulfonamides status: Secondary | ICD-10-CM | POA: Insufficient documentation

## 2018-05-04 DIAGNOSIS — M47816 Spondylosis without myelopathy or radiculopathy, lumbar region: Secondary | ICD-10-CM | POA: Diagnosis not present

## 2018-05-04 DIAGNOSIS — R37 Sexual dysfunction, unspecified: Secondary | ICD-10-CM

## 2018-05-04 DIAGNOSIS — Z79899 Other long term (current) drug therapy: Secondary | ICD-10-CM | POA: Insufficient documentation

## 2018-05-04 DIAGNOSIS — M48061 Spinal stenosis, lumbar region without neurogenic claudication: Secondary | ICD-10-CM | POA: Insufficient documentation

## 2018-05-04 DIAGNOSIS — G8929 Other chronic pain: Secondary | ICD-10-CM

## 2018-05-04 DIAGNOSIS — Z79891 Long term (current) use of opiate analgesic: Secondary | ICD-10-CM | POA: Insufficient documentation

## 2018-05-04 DIAGNOSIS — M533 Sacrococcygeal disorders, not elsewhere classified: Secondary | ICD-10-CM | POA: Insufficient documentation

## 2018-05-04 MED ORDER — OXYCODONE HCL 10 MG PO TABS: 10.0000 mg | ORAL_TABLET | Freq: Four times a day (QID) | ORAL | 0 refills | Status: DC | PRN

## 2018-05-04 MED ORDER — VIAGRA 100 MG PO TABS: 100.0000 mg | ORAL_TABLET | ORAL | 1 refills | Status: DC | PRN

## 2018-05-04 NOTE — Patient Instructions (Addendum)
____________________________________________________________________________________________  Medication Rules  Applies to: All patients receiving prescriptions (written or electronic).  Pharmacy of record: Pharmacy where electronic prescriptions will be sent. If written prescriptions are taken to a different pharmacy, please inform the nursing staff. The pharmacy listed in the electronic medical record should be the one where you would like electronic prescriptions to be sent.  Prescription refills: Only during scheduled appointments. Applies to both, written and electronic prescriptions.  NOTE: The following applies primarily to controlled substances (Opioid* Pain Medications).   Patient's responsibilities: 1. Pain Pills: Bring all pain pills to every appointment (except for procedure appointments). 2. Pill Bottles: Bring pills in original pharmacy bottle. Always bring newest bottle. Bring bottle, even if empty. 3. Medication refills: You are responsible for knowing and keeping track of what medications you need refilled. The day before your appointment, write a list of all prescriptions that need to be refilled. Bring that list to your appointment and give it to the admitting nurse. Prescriptions will be written only during appointments. If you forget a medication, it will not be "Called in", "Faxed", or "electronically sent". You will need to get another appointment to get these prescribed. 4. Prescription Accuracy: You are responsible for carefully inspecting your prescriptions before leaving our office. Have the discharge nurse carefully go over each prescription with you, before taking them home. Make sure that your name is accurately spelled, that your address is correct. Check the name and dose of your medication to make sure it is accurate. Check the number of pills, and the written instructions to make sure they are clear and accurate. Make sure that you are given enough medication to last  until your next medication refill appointment. 5. Taking Medication: Take medication as prescribed. Never take more pills than instructed. Never take medication more frequently than prescribed. Taking less pills or less frequently is permitted and encouraged, when it comes to controlled substances (written prescriptions).  6. Inform other Doctors: Always inform, all of your healthcare providers, of all the medications you take. 7. Pain Medication from other Providers: You are not allowed to accept any additional pain medication from any other Doctor or Healthcare provider. There are two exceptions to this rule. (see below) In the event that you require additional pain medication, you are responsible for notifying us, as stated below. 8. Medication Agreement: You are responsible for carefully reading and following our Medication Agreement. This must be signed before receiving any prescriptions from our practice. Safely store a copy of your signed Agreement. Violations to the Agreement will result in no further prescriptions. (Additional copies of our Medication Agreement are available upon request.) 9. Laws, Rules, & Regulations: All patients are expected to follow all Federal and State Laws, Statutes, Rules, & Regulations. Ignorance of the Laws does not constitute a valid excuse. The use of any illegal substances is prohibited. 10. Adopted CDC guidelines & recommendations: Target dosing levels will be at or below 60 MME/day. Use of benzodiazepines** is not recommended.  Exceptions: There are only two exceptions to the rule of not receiving pain medications from other Healthcare Providers. 1. Exception #1 (Emergencies): In the event of an emergency (i.e.: accident requiring emergency care), you are allowed to receive additional pain medication. However, you are responsible for: As soon as you are able, call our office [phone], at any time of the day or night, and leave a message stating your name, the  date and nature of the emergency, and the name and dose of the medication   prescribed. In the event that your call is answered by a member of our staff, make sure to document and save the date, time, and the name of the person that took your information.  2. Exception #2 (Planned Surgery): In the event that you are scheduled by another doctor or dentist to have any type of surgery or procedure, you are allowed (for a period no longer than 30 days), to receive additional pain medication, for the acute post-op pain. However, in this case, you are responsible for picking up a copy of our "Post-op Pain Management for Surgeons" handout, and giving it to your surgeon or dentist. This document is available at our office, and does not require an appointment to obtain it. Simply go to our office during business hours (Monday-Thursday from 8:00 AM to 4:00 PM) (Friday 8:00 AM to 12:00 Noon) or if you have a scheduled appointment with [place], prior to your surgery, and ask for it by name. In addition, you will need to provide [place] with your name, name of your surgeon, type of surgery, and date of procedure or surgery.  *Opioid medications include: morphine, codeine, oxycodone, oxymorphone, hydrocodone, hydromorphone, meperidine, tramadol, tapentadol, buprenorphine, fentanyl, methadone. **Benzodiazepine medications include: diazepam (Valium), alprazolam (Xanax), clonazepam (Klonopine), lorazepam (Ativan), clorazepate (Tranxene), chlordiazepoxide (Librium), estazolam (Prosom), oxazepam (Serax), temazepam (Restoril), triazolam (Halcion) (Last updated: 09/15/2017) ____________________________________________________________________________________________    BMI Assessment: Estimated body mass index is 37.66 kg/m as calculated from the following:   Height as of this encounter: 5\' 11"  (1.803 m).   Weight as of this encounter: 270 lb (122.5 kg).  BMI interpretation table: BMI level Category Range association with higher  incidence of chronic pain  <18 kg/m2 Underweight   18.5-24.9 kg/m2 Ideal body weight   25-29.9 kg/m2 Overweight Increased incidence by 20%  30-34.9 kg/m2 Obese (Class I) Increased incidence by 68%  35-39.9 kg/m2 Severe obesity (Class II) Increased incidence by 136%  >40 kg/m2 Extreme obesity (Class III) Increased incidence by 254%   Patient's current BMI Ideal Body weight  Body mass index is 37.66 kg/m. Ideal body weight: 75.3 kg (166 lb 0.1 oz) Adjusted ideal body weight: 94.2 kg (207 lb 9.7 oz)   BMI Readings from Last 4 Encounters:  05/04/18 37.66 kg/m  02/21/18 36.26 kg/m  12/22/17 36.96 kg/m  11/24/17 36.26 kg/m   Wt Readings from Last 4 Encounters:  05/04/18 270 lb (122.5 kg)  02/21/18 260 lb (117.9 kg)  12/22/17 265 lb (120.2 kg)  11/24/17 260 lb (117.9 kg)

## 2018-05-04 NOTE — Progress Notes (Signed)
Patient's Name: Nathan Lambert.  MRN: [id number]  Referring Provider: Vidal Schwalbe, MD  DOB: [birth year]  PCP: Vidal Schwalbe, MD  DOS: 05/04/2018  Note by: Vevelyn Francois NP  Service setting: Ambulatory outpatient  Specialty: Interventional Pain Management  Location: [facility] (AMB) Pain Management Facility    Patient type: Established    Primary Reason(s) for Visit: Encounter for prescription drug management. (Level of risk: moderate)  CC: Back Pain (low)  HPI  Nathan Lambert is a 37 y.o. year old, male patient, who comes today for a medication management evaluation. 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 His primarily concern today is the Back Pain (low)  Pain Assessment: Location: Lower Back Onset: More than a month ago Duration: Chronic pain Quality: Aching, Constant, Stabbing Severity: 3 /10 (subjective, self-reported pain score)  Note: Reported level is compatible with observation.                          Timing: Constant Modifying factors: medications, lying flat BP: 135/89  HR: 68  Nathan Lambert  was last scheduled for an appointment on 02/21/2018 for medication management. During today's appointment we reviewed Nathan Lambert chronic pain status, as well as his outpatient medication regimen.  He denies any current concerns today.  He admits that his pain is stable.  He has expressed in previous visits that he would like to have more options for treatment of his chronic low back pain.  The patient  reports that he does not use drugs. His body mass index is 37.66 kg/m.  Further details on both, my assessment(s), as well as the proposed treatment plan, please see below.  Controlled Substance Pharmacotherapy Assessment REMS (Risk Evaluation and Mitigation Strategy)  Analgesic:oxycodone 10 mg 4 times daily MME/day:36m/day. [name] RN  05/04/2018 10:47 AM  Sign at close encounter Nursing Pain Medication Assessment:  Safety precautions to be maintained throughout the outpatient stay will include: orient to surroundings, keep bed in low position, maintain call bell within reach at all times, provide assistance with transfer out of bed and ambulation.  Medication Inspection Compliance: Pill count conducted under aseptic conditions, in front of the patient. Neither the pills nor the bottle was removed from the patient's sight at any time. Once count was completed pills were immediately returned to the patient in their original bottle.  Medication: Oxycodone IR Pill/Patch Count: 39 of 120 pills remain Pill/Patch Appearance: Markings consistent with prescribed medication Bottle Appearance: Standard pharmacy container. Clearly labeled. Filled Date: 09 / 27 / 2019 Last Medication intake:  Today   Pharmacokinetics: Liberation and absorption (onset of action): WNL Distribution (time to peak effect): WNL Metabolism and excretion (duration of action): WNL         Pharmacodynamics: Desired effects: Analgesia: Nathan Lambert >50% benefit. Functional ability: Patient  reports that medication allows him to accomplish basic ADLs Clinically meaningful improvement in function (CMIF): Sustained CMIF goals met Perceived effectiveness: Described as relatively effective, allowing for increase in activities of daily living (ADL) Undesirable effects: Side-effects or Adverse reactions: Constipation on and off using over-the-counter medications Monitoring: [place] PMP: Online review of the past 75-monthperiod conducted. Compliant with practice rules and regulations Last UDS on record: Summary  Date Value Ref Range Status  12/22/2017 FINAL  Final    Comment:    ==================================================================== TOXASSURE SELECT 13 (MW) ==================================================================== Test                             Result       Flag       Units Drug Present   Oxycodone                      356                     ng/mg creat   Oxymorphone                    808                     ng/mg creat   Noroxycodone                   661                     ng/mg creat   Noroxymorphone                 255                     ng/mg creat    Sources of oxycodone are scheduled prescription medications.    Oxymorphone, noroxycodone, and noroxymorphone are expected    metabolites of oxycodone. Oxymorphone is also available as a    scheduled prescription medication. ==================================================================== Test                      Result    Flag   Units      Ref Range   Creatinine              236              mg/dL      >=20 ==================================================================== Declared Medications:  Medication list was not provided. ==================================================================== For clinical consultation, please call [phone] ====================================================================    UDS interpretation: Compliant          Medication Assessment Form:  Reviewed. Patient indicates being compliant with therapy Treatment compliance: Compliant Risk Assessment Profile: Aberrant behavior: See prior evaluations. None observed or detected today Comorbid factors increasing risk of overdose: See prior notes. No additional risks detected today Opioid risk tool (ORT) (Total Score): 4 Personal History of Substance Abuse (SUD-Substance use disorder):  Alcohol: Negative  Illegal Drugs: Negative  Rx Drugs: Negative  ORT Risk Level calculation: Moderate Risk Risk of substance use disorder (SUD): Low Opioid Risk Tool - 05/04/18 1050      Family History of Substance Abuse   Alcohol  Positive Male    Illegal Drugs  Negative    Rx Drugs  Negative      Personal History of Substance Abuse   Alcohol  Negative    Illegal Drugs  Negative    Rx Drugs  Negative      Age   Age between 83-45 years   Yes      History of Preadolescent Sexual Abuse   History of Preadolescent Sexual Abuse  Negative or Male      Psychological Disease   Psychological Disease  Negative    Depression  Negative      Total Score   Opioid Risk Tool Scoring  4    Opioid Risk Interpretation  Moderate Risk      ORT Scoring interpretation table:  Score <3 = Low Risk for SUD  Score between 4-7 = Moderate Risk for SUD  Score >8 = High Risk for Opioid Abuse   Risk Mitigation Strategies:  Patient Counseling: Covered Patient-Prescriber Agreement (PPA): Present and active  Notification to other healthcare providers: Done  Pharmacologic Plan: No change in therapy, at this time.             Laboratory Chemistry  Inflammation Markers (CRP: Acute Phase) (ESR: Chronic Phase) Lab Results  Component Value Date   CRP 8.5 (H) 12/27/2016   ESRSEDRATE 35 (H) 12/27/2016                         Rheumatology Markers No results found for: RF, ANA, LABURIC, URICUR, LYMEIGGIGMAB, LYMEABIGMQN, HLAB27                      Renal Function Markers Lab Results  Component Value Date   BUN  6 12/27/2016   CREATININE 0.83 12/27/2016   BCR 7 (L) 12/27/2016   GFRAA 133 12/27/2016   GFRNONAA 115 12/27/2016                             Hepatic Function Markers Lab Results  Component Value Date   AST 11 12/27/2016   ALT 15 12/27/2016   ALBUMIN 4.2 12/27/2016   ALKPHOS 56 12/27/2016                        Electrolytes Lab Results  Component Value Date   NA 144 12/27/2016   K 4.3 12/27/2016   CL 106 12/27/2016   CALCIUM 9.0 12/27/2016   MG 2.1 12/27/2016                        Neuropathy Markers Lab Results  Component Value Date   ZHGDJMEQ68 341 12/27/2016                        CNS Tests No results found for: COLORCSF, APPEARCSF, RBCCOUNTCSF, WBCCSF, POLYSCSF, LYMPHSCSF, EOSCSF, PROTEINCSF, GLUCCSF, JCVIRUS, CSFOLI, IGGCSF                      Bone Pathology Markers Lab Results  Component Value Date   25OHVITD1 17 (L) 12/27/2016   25OHVITD2 <1.0 12/27/2016   25OHVITD3 17 12/27/2016   TESTOFREE 2.6 (L) 06/08/2017   TESTOSTERONE 116 (L) 06/08/2017                         Coagulation Parameters No results found for: INR, LABPROT, APTT, PLT, DDIMER                      Cardiovascular Markers No results found for: BNP, CKTOTAL,  CKMB, TROPONINI, HGB, HCT                       CA Markers No results found for: CEA, CA125, LABCA2                      Note: Lab results reviewed.  Recent Diagnostic Imaging Results  DG C-Arm 1-60 Min-No Report Fluoroscopy was utilized by the requesting physician.  No radiographic  interpretation.   Complexity Note: Imaging results reviewed. Results shared with Nathan Lambert, using Layman's terms.                         Meds   Current Outpatient Medications:  .  loratadine (CLARITIN) 10 MG tablet, Take 10 mg by mouth daily as needed for allergies., Disp: , Rfl:  .  montelukast (SINGULAIR) 10 MG tablet, Take 10 mg by mouth at bedtime., Disp: , Rfl:  .  naloxone (NARCAN) 2 MG/2ML injection, Inject content of syringe into  thigh muscle. Call 911., Disp: 2 Syringe, Rfl: 1 .  [START ON 07/13/2018] Oxycodone HCl 10 MG TABS, Take 1 tablet (10 mg total) by mouth every 6 (six) hours as needed., Disp: 120 tablet, Rfl: 0 .  testosterone cypionate (DEPOTESTOSTERONE CYPIONATE) 200 MG/ML injection, Inject into the muscle every 7 (seven) days., Disp: , Rfl:  .  VIAGRA 100 MG tablet, Take 1 tablet (100 mg total) by mouth as needed for erectile dysfunction., Disp: 20 tablet, Rfl: 1 .  gabapentin (NEURONTIN) 300 MG capsule, Take 1-3 capsules (300-900 mg total) by mouth 3 (three) times daily. Follow written titration schedule., Disp: 270 capsule, Rfl: 1 .  [START ON 06/13/2018] Oxycodone HCl 10 MG TABS, Take 1 tablet (10 mg total) by mouth every 6 (six) hours as needed., Disp: 120 tablet, Rfl: 0 .  [START ON 05/14/2018] Oxycodone HCl 10 MG TABS, Take 1 tablet (10 mg total) by mouth every 6 (six) hours as needed., Disp: 120 tablet, Rfl: 0  ROS  Constitutional: Denies any fever or chills Gastrointestinal: No reported hemesis, hematochezia, vomiting, or acute GI distress Musculoskeletal: Denies any acute onset joint swelling, redness, loss of ROM, or weakness Neurological: No reported episodes of acute onset apraxia, aphasia, dysarthria, agnosia, amnesia, paralysis, loss of coordination, or loss of consciousness  Allergies  Nathan Lambert is allergic to bactrim [sulfamethoxazole-trimethoprim].  PFSH  Drug: Nathan Lambert  reports that he does not use drugs. Alcohol:  reports that he does not drink alcohol. Tobacco:  reports that he has been smoking cigarettes. He has a 10.00 pack-year smoking history. He has never used smokeless tobacco. Medical:  has a past medical history of Allergy (2012), Chronic low back pain (Primary Area of Pain) (Bilateral) (R>L), Degeneration of lumbar or lumbosacral intervertebral disc (L4-L5) (12/27/2016), Headache, and Opiate use (02/15/2017). Surgical: Nathan Lambert  has a past surgical history that  includes Fracture surgery (Left); Spine surgery (Right, 06/16/2015); and Back surgery. Family: family history includes AAA (abdominal aortic aneurysm) in his mother; Diabetes in his father; Stroke in his mother.  Constitutional Exam  General appearance: Well nourished, well developed, and well hydrated. In no apparent acute distress Vitals:   05/04/18 1043  BP: 135/89  Pulse: 68  Resp: 18  Temp: 98.4 F (36.9 C)  TempSrc: Oral  SpO2: 100%  Weight: 270 lb (122.5 kg)  Height: 5' 11"  (1.803 m)   BMI Assessment: Estimated body mass index is 37.66 kg/m as  calculated from the following:   Height as of this encounter: 5' 11"  (1.803 m).   Weight as of this encounter: 270 lb (122.5 kg). Psych/Mental status: Alert, oriented x 3 (person, place, & time)       Eyes: PERLA Respiratory: No evidence of acute respiratory distress  Lumbar Spine Area Exam  Skin & Axial Inspection: Well healed scar from previous spine surgery detected Alignment: Symmetrical Functional ROM: Unrestricted ROM       Stability: No instability detected Muscle Tone/Strength: Functionally intact. No obvious neuro-muscular anomalies detected. Sensory (Neurological): Unimpaired Palpation: Tender       Provocative Tests: Hyperextension/rotation test: Positive       Lateral bending test: (+)         Gait & Posture Assessment  Ambulation: Unassisted Gait: Relatively normal for age and body habitus Posture: WNL   Lower Extremity Exam    Side: Right lower extremity  Side: Left lower extremity  Stability: No instability observed          Stability: No instability observed          Skin & Extremity Inspection: Skin color, temperature, and hair growth are WNL. No peripheral edema or cyanosis. No masses, redness, swelling, asymmetry, or associated skin lesions. No contractures.  Skin & Extremity Inspection: Skin color, temperature, and hair growth are WNL. No peripheral edema or cyanosis. No masses, redness, swelling,  asymmetry, or associated skin lesions. No contractures.  Functional ROM: Unrestricted ROM                  Functional ROM: Unrestricted ROM                  Muscle Tone/Strength: Functionally intact. No obvious neuro-muscular anomalies detected.  Muscle Tone/Strength: Functionally intact. No obvious neuro-muscular anomalies detected.  Sensory (Neurological): Unimpaired  Sensory (Neurological): Unimpaired  Palpation: No palpable anomalies  Palpation: No palpable anomalies   Assessment  Primary Diagnosis & Pertinent Problem List: The primary encounter diagnosis was Lumbar spondylosis. Diagnoses of Chronic sacroiliac joint pain (Bilateral) (R>L), Lumbar facet hypertrophy (Bilateral), Chronic pain syndrome, Long term (current) use of opiate analgesic, and Sexual dysfunction were also pertinent to this visit.  Status Diagnosis  Persistent Controlled Controlled 1. Lumbar spondylosis   2. Chronic sacroiliac joint pain (Bilateral) (R>L)   3. Lumbar facet hypertrophy (Bilateral)   4. Chronic pain syndrome   5. Long term (current) use of opiate analgesic   6. Sexual dysfunction     Problems updated and reviewed during this visit: No problems updated. Plan of Care  Pharmacotherapy (Medications Ordered): Meds ordered this encounter  Medications  . Oxycodone HCl 10 MG TABS    Sig: Take 1 tablet (10 mg total) by mouth every 6 (six) hours as needed.    Dispense:  120 tablet    Refill:  0    Do not place this medication, or any other prescription from our practice, on "Automatic Refill". Patient may have prescription filled one day early if pharmacy is closed on scheduled refill date.    Order Specific Question:   Supervising Provider    Answer:   Milinda Pointer [phone]  . Oxycodone HCl 10 MG TABS    Sig: Take 1 tablet (10 mg total) by mouth every 6 (six) hours as needed.    Dispense:  120 tablet    Refill:  0    Do not place this medication, or any other prescription from our practice,  on "Automatic Refill". Patient may have  prescription filled one day early if pharmacy is closed on scheduled refill date.    Order Specific Question:   Supervising Provider    Answer:   Milinda Pointer [phone]  . Oxycodone HCl 10 MG TABS    Sig: Take 1 tablet (10 mg total) by mouth every 6 (six) hours as needed.    Dispense:  120 tablet    Refill:  0    Do not place this medication, or any other prescription from our practice, on "Automatic Refill". Patient may have prescription filled one day early if pharmacy is closed on scheduled refill date.    Order Specific Question:   Supervising Provider    Answer:   Milinda Pointer [phone]  . VIAGRA 100 MG tablet    Sig: Take 1 tablet (100 mg total) by mouth as needed for erectile dysfunction.    Dispense:  20 tablet    Refill:  1    Order Specific Question:   Supervising Provider    Answer:   Milinda Pointer [phone]   New Prescriptions   OXYCODONE HCL 10 MG TABS    Take 1 tablet (10 mg total) by mouth every 6 (six) hours as needed.   Medications administered today: Nathan L. Spagnolo Jr. had no medications administered during this visit. Lab-work, procedure(s), and/or referral(s): Orders Placed This Encounter  Procedures  . ToxASSURE Select 13 (MW), Urine   Imaging and/or referral(s): None Interventional therapies: Planned, scheduled, and/or pending: Not at this time.   Considering:  Diagnostic bilateral lumbar facet block Possible bilateral lumbar facet RFA Diagnostic bilateral sacroiliac joint block Possible bilateral sacroiliac joint RFA Diagnostic caudal epidural steroid injection + diagnostic epidurogram Possible Racz procedure Diagnostic left intra-articular knee injection with local anesthetic and steroid Possible left series of 5 intra-articular Hyalgan knee injections Diagnostic left Genicular nerve block Possible left Genicular nerve RFA   Palliative PRN treatment(s):  None at this time      Provider-requested follow-up: Return in about 3 months (around 08/04/2018) for MedMgmt, Follow up w/ Dr. Lowella Dandy for addtional treatment  options.  Future Appointments  Date Time Provider [place]  05/17/2018  8:45 AM Milinda Pointer, MD [facility] None  08/03/2018 10:45 AM Vevelyn Francois, NP [facility] None   Primary Care Physician: Vidal Schwalbe, MD Location: [facility] Outpatient Pain Management Facility Note by: Vevelyn Francois NP Date: 05/04/2018; Time: 3:58 PM  Pain Score Disclaimer: We use the NRS-11 scale. This is a self-reported, subjective measurement of pain severity with only modest accuracy. It is used primarily to identify changes within a particular patient. It must be understood that outpatient pain scales are significantly less accurate that those used for research, where they can be applied under ideal controlled circumstances with minimal exposure to variables. In reality, the score is likely to be a combination of pain intensity and pain affect, where pain affect describes the degree of emotional arousal or changes in action readiness caused by the sensory experience of pain. Factors such as social and work situation, setting, emotional state, anxiety levels, expectation, and prior pain experience may influence pain perception and show large inter-individual differences that may also be affected by time variables.  Patient instructions provided during this appointment: Patient Instructions   ____________________________________________________________________________________________  Medication Rules  Applies to: All patients receiving prescriptions (written or electronic).  Pharmacy of record: Pharmacy where electronic prescriptions will be sent. If written prescriptions are taken to a different pharmacy, please inform the nursing staff. The pharmacy listed in the electronic  medical record should be the one where you would like electronic prescriptions to be  sent.  Prescription refills: Only during scheduled appointments. Applies to both, written and electronic prescriptions.  NOTE: The following applies primarily to controlled substances (Opioid* Pain Medications).   Patient's responsibilities: 1. Pain Pills: Bring all pain pills to every appointment (except for procedure appointments). 2. Pill Bottles: Bring pills in original pharmacy bottle. Always bring newest bottle. Bring bottle, even if empty. 3. Medication refills: You are responsible for knowing and keeping track of what medications you need refilled. The day before your appointment, write a list of all prescriptions that need to be refilled. Bring that list to your appointment and give it to the admitting nurse. Prescriptions will be written only during appointments. If you forget a medication, it will not be "Called in", "Faxed", or "electronically sent". You will need to get another appointment to get these prescribed. 4. Prescription Accuracy: You are responsible for carefully inspecting your prescriptions before leaving our office. Have the discharge nurse carefully go over each prescription with you, before taking them home. Make sure that your name is accurately spelled, that your address is correct. Check the name and dose of your medication to make sure it is accurate. Check the number of pills, and the written instructions to make sure they are clear and accurate. Make sure that you are given enough medication to last until your next medication refill appointment. 5. Taking Medication: Take medication as prescribed. Never take more pills than instructed. Never take medication more frequently than prescribed. Taking less pills or less frequently is permitted and encouraged, when it comes to controlled substances (written prescriptions).  6. Inform other Doctors: Always inform, all of your healthcare providers, of all the medications you take. 7. Pain Medication from other Providers: You  are not allowed to accept any additional pain medication from any other Doctor or Healthcare provider. There are two exceptions to this rule. (see below) In the event that you require additional pain medication, you are responsible for notifying us, as stated below. 8. Medication Agreement: You are responsible for carefully reading and following our Medication Agreement. This must be signed before receiving any prescriptions from our practice. Safely store a copy of your signed Agreement. Violations to the Agreement will result in no further prescriptions. (Additional copies of our Medication Agreement are available upon request.) 9. Laws, Rules, & Regulations: All patients are expected to follow all Federal and [employer], Rules, [employer]. Ignorance of the Laws does not constitute a valid excuse. The use of any illegal substances is prohibited. 10. Adopted CDC guidelines & recommendations: Target dosing levels will be at or below 60 MME/day. Use of benzodiazepines** is not recommended.  Exceptions: There are only two exceptions to the rule of not receiving pain medications from other Healthcare Providers. 1. Exception #1 (Emergencies): In the event of an emergency (i.e.: accident requiring emergency care), you are allowed to receive additional pain medication. However, you are responsible for: As soon as you are able, call our office [phone], at any time of the day or night, and leave a message stating your name, the date and nature of the emergency, and the name and dose of the medication prescribed. In the event that your call is answered by a member of our staff, make sure to document and save the date, time, and the name of the person that took your information.  2. Exception #2 (Planned Surgery): In the event that you  are scheduled by another doctor or dentist to have any type of surgery or procedure, you are allowed (for a period no longer than 30 days), to receive additional pain  medication, for the acute post-op pain. However, in this case, you are responsible for picking up a copy of our "Post-op Pain Management for Surgeons" handout, and giving it to your surgeon or dentist. This document is available at our office, and does not require an appointment to obtain it. Simply go to our office during business hours (Monday-Thursday from 8:00 AM to 4:00 PM) (Friday 8:00 AM to 12:00 Noon) or if you have a scheduled appointment with [place], prior to your surgery, and ask for it by name. In addition, you will need to provide [place] with your name, name of your surgeon, type of surgery, and date of procedure or surgery.  *Opioid medications include: morphine, codeine, oxycodone, oxymorphone, hydrocodone, hydromorphone, meperidine, tramadol, tapentadol, buprenorphine, fentanyl, methadone. **Benzodiazepine medications include: diazepam (Valium), alprazolam (Xanax), clonazepam (Klonopine), lorazepam (Ativan), clorazepate (Tranxene), chlordiazepoxide (Librium), estazolam (Prosom), oxazepam (Serax), temazepam (Restoril), triazolam (Halcion) (Last updated: 09/15/2017) ____________________________________________________________________________________________    BMI Assessment: Estimated body mass index is 37.66 kg/m as calculated from the following:   Height as of this encounter: 5' 11"  (1.803 m).   Weight as of this encounter: 270 lb (122.5 kg).  BMI interpretation table: BMI level Category Range association with higher incidence of chronic pain  <18 kg/m2 Underweight   18.5-24.9 kg/m2 Ideal body weight   25-29.9 kg/m2 Overweight Increased incidence by 20%  30-34.9 kg/m2 Obese (Class I) Increased incidence by 68%  35-39.9 kg/m2 Severe obesity (Class II) Increased incidence by 136%  >40 kg/m2 Extreme obesity (Class III) Increased incidence by 254%   Patient's current BMI Ideal Body weight  Body mass index is 37.66 kg/m. Ideal body weight: 75.3 kg (166 lb 0.1 oz) Adjusted ideal body  weight: 94.2 kg (207 lb 9.7 oz)   BMI Readings from Last 4 Encounters:  05/04/18 37.66 kg/m  02/21/18 36.26 kg/m  12/22/17 36.96 kg/m  11/24/17 36.26 kg/m   Wt Readings from Last 4 Encounters:  05/04/18 270 lb (122.5 kg)  02/21/18 260 lb (117.9 kg)  12/22/17 265 lb (120.2 kg)  11/24/17 260 lb (117.9 kg)

## 2018-05-04 NOTE — Progress Notes (Signed)
Nursing Pain Medication Assessment:  Safety precautions to be maintained throughout the outpatient stay will include: orient to surroundings, keep bed in low position, maintain call bell within reach at all times, provide assistance with transfer out of bed and ambulation.  Medication Inspection Compliance: Pill count conducted under aseptic conditions, in front of the patient. Neither the pills nor the bottle was removed from the patient's sight at any time. Once count was completed pills were immediately returned to the patient in their original bottle.  Medication: Oxycodone IR Pill/Patch Count: 39 of 120 pills remain Pill/Patch Appearance: Markings consistent with prescribed medication Bottle Appearance: Standard pharmacy container. Clearly labeled. Filled Date: 09 / 27 / 2019 Last Medication intake:  Today

## 2018-05-10 LAB — TOXASSURE SELECT 13 (MW), URINE
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
## 2018-05-17 ENCOUNTER — Other Ambulatory Visit
Admission: RE | Admit: 2018-05-17 | Discharge: 2018-05-17 | Disposition: A | Discharge status: Home / Self Care | Payer: BLUE CROSS/BLUE SHIELD | Source: Ambulatory Visit | Attending: Pain Medicine | Admitting: Pain Medicine

## 2018-05-17 ENCOUNTER — Ambulatory Visit (HOSPITAL_BASED_OUTPATIENT_CLINIC_OR_DEPARTMENT_OTHER): Payer: BLUE CROSS/BLUE SHIELD | Admitting: Pain Medicine

## 2018-05-17 ENCOUNTER — Other Ambulatory Visit: Payer: Self-pay

## 2018-05-17 ENCOUNTER — Encounter: Payer: Self-pay | Admitting: Pain Medicine

## 2018-05-17 VITALS — BP 135/83 | HR 62 | Temp 97.9°F | Ht 71.0 in | Wt 275.0 lb

## 2018-05-17 DIAGNOSIS — Z789 Other specified health status: Secondary | ICD-10-CM | POA: Insufficient documentation

## 2018-05-17 DIAGNOSIS — E559 Vitamin D deficiency, unspecified: Secondary | ICD-10-CM

## 2018-05-17 DIAGNOSIS — M79605 Pain in left leg: Secondary | ICD-10-CM

## 2018-05-17 DIAGNOSIS — Z823 Family history of stroke: Secondary | ICD-10-CM | POA: Insufficient documentation

## 2018-05-17 DIAGNOSIS — M5441 Lumbago with sciatica, right side: Secondary | ICD-10-CM

## 2018-05-17 DIAGNOSIS — K5900 Constipation, unspecified: Secondary | ICD-10-CM

## 2018-05-17 DIAGNOSIS — M5388 Other specified dorsopathies, sacral and sacrococcygeal region: Secondary | ICD-10-CM | POA: Insufficient documentation

## 2018-05-17 DIAGNOSIS — Z833 Family history of diabetes mellitus: Secondary | ICD-10-CM | POA: Insufficient documentation

## 2018-05-17 DIAGNOSIS — M5136 Other intervertebral disc degeneration, lumbar region: Secondary | ICD-10-CM | POA: Insufficient documentation

## 2018-05-17 DIAGNOSIS — G894 Chronic pain syndrome: Secondary | ICD-10-CM

## 2018-05-17 DIAGNOSIS — Z882 Allergy status to sulfonamides status: Secondary | ICD-10-CM | POA: Insufficient documentation

## 2018-05-17 DIAGNOSIS — Z79890 Hormone replacement therapy (postmenopausal): Secondary | ICD-10-CM

## 2018-05-17 DIAGNOSIS — M533 Sacrococcygeal disorders, not elsewhere classified: Secondary | ICD-10-CM

## 2018-05-17 DIAGNOSIS — M48061 Spinal stenosis, lumbar region without neurogenic claudication: Secondary | ICD-10-CM

## 2018-05-17 DIAGNOSIS — M899 Disorder of bone, unspecified: Secondary | ICD-10-CM | POA: Diagnosis not present

## 2018-05-17 DIAGNOSIS — M25562 Pain in left knee: Secondary | ICD-10-CM

## 2018-05-17 DIAGNOSIS — M961 Postlaminectomy syndrome, not elsewhere classified: Secondary | ICD-10-CM

## 2018-05-17 DIAGNOSIS — F17210 Nicotine dependence, cigarettes, uncomplicated: Secondary | ICD-10-CM

## 2018-05-17 DIAGNOSIS — M79604 Pain in right leg: Secondary | ICD-10-CM | POA: Diagnosis not present

## 2018-05-17 DIAGNOSIS — Z79899 Other long term (current) drug therapy: Secondary | ICD-10-CM | POA: Insufficient documentation

## 2018-05-17 DIAGNOSIS — Z8249 Family history of ischemic heart disease and other diseases of the circulatory system: Secondary | ICD-10-CM | POA: Insufficient documentation

## 2018-05-17 DIAGNOSIS — Z79891 Long term (current) use of opiate analgesic: Secondary | ICD-10-CM | POA: Insufficient documentation

## 2018-05-17 DIAGNOSIS — M47816 Spondylosis without myelopathy or radiculopathy, lumbar region: Secondary | ICD-10-CM

## 2018-05-17 DIAGNOSIS — G8929 Other chronic pain: Secondary | ICD-10-CM

## 2018-05-17 DIAGNOSIS — M5442 Lumbago with sciatica, left side: Secondary | ICD-10-CM

## 2018-05-17 LAB — COMPREHENSIVE METABOLIC PANEL
ALBUMIN: 4.1 g/dL (ref 3.5–5.0)
ALK PHOS: 47 U/L (ref 38–126)
ALT: 15 U/L (ref 0–44)
AST: 13 U/L — AB (ref 15–41)
Anion gap: 6 (ref 5–15)
BUN: 13 mg/dL (ref 6–20)
CHLORIDE: 107 mmol/L (ref 98–111)
CO2: 26 mmol/L (ref 22–32)
Calcium: 8.9 mg/dL (ref 8.9–10.3)
Creatinine, Ser: 0.85 mg/dL (ref 0.61–1.24)
GFR calc Af Amer: >60 mL/min (ref >60)
GFR calc non Af Amer: >60 mL/min (ref >60)
Glucose, Bld: 114 mg/dL — ABNORMAL HIGH (ref 70–99)
POTASSIUM: 4.3 mmol/L (ref 3.5–5.1)
SODIUM: 139 mmol/L (ref 135–145)
TOTAL PROTEIN: 7.9 g/dL (ref 6.5–8.1)
Total Bilirubin: 0.5 mg/dL (ref 0.3–1.2)

## 2018-05-17 LAB — SEDIMENTATION RATE: SED RATE: 10 mm/h (ref 0–15)

## 2018-05-17 LAB — MAGNESIUM: Magnesium: 2 mg/dL (ref 1.7–2.4)

## 2018-05-17 LAB — C-REACTIVE PROTEIN: CRP: 1.3 mg/dL — ABNORMAL HIGH (ref <1.0)

## 2018-05-17 LAB — VITAMIN B12: Vitamin B-12: 222 pg/mL (ref 180–914)

## 2018-05-17 NOTE — Patient Instructions (Signed)
____________________________________________________________________________________________  Preparing for Procedure with Sedation  Instructions: . Oral Intake: Do not eat or drink anything for at least 8 hours prior to your procedure. . Transportation: Public transportation is not allowed. Bring an adult driver. The driver must be physically present in our waiting room before any procedure can be started. . Physical Assistance: Bring an adult physically capable of assisting you, in the event you need help. This adult should keep you company at home for at least 6 hours after the procedure. . Blood Pressure Medicine: Take your blood pressure medicine with a sip of water the morning of the procedure. . Blood thinners: Notify our staff if you are taking any blood thinners. Depending on which one you take, there will be specific instructions on how and when to stop it. . Diabetics on insulin: Notify the staff so that you can be scheduled 1st case in the morning. If your diabetes requires high dose insulin, take only  of your normal insulin dose the morning of the procedure and notify the staff that you have done so. . Preventing infections: Shower with an antibacterial soap the morning of your procedure. . Build-up your immune system: Take 1000 mg of Vitamin C with every meal (3 times a day) the day prior to your procedure. . Antibiotics: Inform the staff if you have a condition or reason that requires you to take antibiotics before dental procedures. . Pregnancy: If you are pregnant, call and cancel the procedure. . Sickness: If you have a cold, fever, or any active infections, call and cancel the procedure. . Arrival: You must be in the facility at least 30 minutes prior to your scheduled procedure. . Children: Do not bring children with you. . Dress appropriately: Bring dark clothing that you would not mind if they get stained. . Valuables: Do not bring any jewelry or valuables.  Procedure  appointments are reserved for interventional treatments only. . No Prescription Refills. . No medication changes will be discussed during procedure appointments. . No disability issues will be discussed.  Reasons to call and reschedule or cancel your procedure: (Following these recommendations will minimize the risk of a serious complication.) . Surgeries: Avoid having procedures within 2 weeks of any surgery. (Avoid for 2 weeks before or after any surgery). . Flu Shots: Avoid having procedures within 2 weeks of a flu shots or . (Avoid for 2 weeks before or after immunizations). . Barium: Avoid having a procedure within 7-10 days after having had a radiological study involving the use of radiological contrast. (Myelograms, Barium swallow or enema study). . Heart attacks: Avoid any elective procedures or surgeries for the initial 6 months after a "Myocardial Infarction" (Heart Attack). . Blood thinners: It is imperative that you stop these medications before procedures. Let us know if you if you take any blood thinner.  . Infection: Avoid procedures during or within two weeks of an infection (including chest colds or gastrointestinal problems). Symptoms associated with infections include: Localized redness, fever, chills, night sweats or profuse sweating, burning sensation when voiding, cough, congestion, stuffiness, runny nose, sore throat, diarrhea, nausea, vomiting, cold or Flu symptoms, recent or current infections. It is specially important if the infection is over the area that we intend to treat. . Heart and lung problems: Symptoms that may suggest an active cardiopulmonary problem include: cough, chest pain, breathing difficulties or shortness of breath, dizziness, ankle swelling, uncontrolled high or unusually low blood pressure, and/or palpitations. If you are experiencing any of these symptoms, cancel   your procedure and contact your primary care physician for an evaluation.  Remember:   Regular Business hours are:  Monday to Thursday 8:00 AM to 4:00 PM  Provider's Schedule: Cattleya Dobratz, MD:  Procedure days: Tuesday and Thursday 7:30 AM to 4:00 PM  Bilal Lateef, MD:  Procedure days: Monday and Wednesday 7:30 AM to 4:00 PM ____________________________________________________________________________________________    

## 2018-05-18 LAB — VITAMIN D 25 HYDROXY (VIT D DEFICIENCY, FRACTURES): Vit D, 25-Hydroxy: 18.6 ng/mL — ABNORMAL LOW (ref 30.0–100.0)

## 2018-05-30 ENCOUNTER — Ambulatory Visit
Admission: RE | Admit: 2018-05-30 | Discharge: 2018-05-30 | Disposition: A | Discharge status: Home / Self Care | Payer: BLUE CROSS/BLUE SHIELD | Source: Ambulatory Visit | Attending: Pain Medicine | Admitting: Pain Medicine

## 2018-05-30 ENCOUNTER — Ambulatory Visit (HOSPITAL_BASED_OUTPATIENT_CLINIC_OR_DEPARTMENT_OTHER): Payer: BLUE CROSS/BLUE SHIELD | Admitting: Pain Medicine

## 2018-05-30 ENCOUNTER — Encounter: Payer: Self-pay | Admitting: Pain Medicine

## 2018-05-30 ENCOUNTER — Other Ambulatory Visit: Payer: Self-pay

## 2018-05-30 VITALS — BP 120/80 | HR 64 | Temp 97.5°F | Resp 16 | Ht 71.0 in | Wt 270.0 lb

## 2018-05-30 DIAGNOSIS — M48061 Spinal stenosis, lumbar region without neurogenic claudication: Secondary | ICD-10-CM

## 2018-05-30 DIAGNOSIS — M5136 Other intervertebral disc degeneration, lumbar region: Secondary | ICD-10-CM | POA: Insufficient documentation

## 2018-05-30 DIAGNOSIS — M79605 Pain in left leg: Secondary | ICD-10-CM | POA: Insufficient documentation

## 2018-05-30 DIAGNOSIS — M545 Low back pain: Secondary | ICD-10-CM | POA: Diagnosis present

## 2018-05-30 DIAGNOSIS — M4807 Spinal stenosis, lumbosacral region: Secondary | ICD-10-CM | POA: Diagnosis not present

## 2018-05-30 DIAGNOSIS — G8929 Other chronic pain: Secondary | ICD-10-CM

## 2018-05-30 DIAGNOSIS — M79604 Pain in right leg: Secondary | ICD-10-CM | POA: Diagnosis not present

## 2018-05-30 DIAGNOSIS — M961 Postlaminectomy syndrome, not elsewhere classified: Secondary | ICD-10-CM

## 2018-05-30 MED ORDER — LIDOCAINE HCL 2 % IJ SOLN
20.0000 mL | Freq: Once | INTRAMUSCULAR | Status: AC
  Administered 2018-05-30: 400 mg
  Filled 2018-05-30: qty 40

## 2018-05-30 MED ORDER — FENTANYL CITRATE (PF) 100 MCG/2ML IJ SOLN
25.0000 ug | INTRAMUSCULAR | Status: DC | PRN
  Administered 2018-05-30: 50 ug via INTRAVENOUS
  Filled 2018-05-30: qty 2

## 2018-05-30 MED ORDER — ROPIVACAINE HCL 2 MG/ML IJ SOLN
1.0000 mL | Freq: Once | INTRAMUSCULAR | Status: AC
  Administered 2018-05-30: 1 mL via EPIDURAL
  Filled 2018-05-30: qty 10

## 2018-05-30 MED ORDER — LACTATED RINGERS IV SOLN
1000.0000 mL | Freq: Once | INTRAVENOUS | Status: AC
  Administered 2018-05-30: 1000 mL via INTRAVENOUS

## 2018-05-30 MED ORDER — DEXAMETHASONE SODIUM PHOSPHATE 10 MG/ML IJ SOLN
10.0000 mg | Freq: Once | INTRAMUSCULAR | Status: AC
  Administered 2018-05-30: 10 mg
  Filled 2018-05-30: qty 1

## 2018-05-30 MED ORDER — MIDAZOLAM HCL 5 MG/5ML IJ SOLN
1.0000 mg | INTRAMUSCULAR | Status: DC | PRN
  Administered 2018-05-30: 2 mg via INTRAVENOUS
  Filled 2018-05-30: qty 5

## 2018-05-30 MED ORDER — SODIUM CHLORIDE 0.9% FLUSH
1.0000 mL | Freq: Once | INTRAVENOUS | Status: AC
  Administered 2018-05-30: 1 mL

## 2018-05-30 MED ORDER — IOPAMIDOL (ISOVUE-M 200) INJECTION 41%
10.0000 mL | Freq: Once | INTRAMUSCULAR | Status: AC
  Administered 2018-05-30: 10 mL via EPIDURAL
  Filled 2018-05-30: qty 10

## 2018-05-30 NOTE — Progress Notes (Signed)
Patient's Name: Nathan FlockCarlton L Soo Jr.  MRN: [id number]  Referring Provider: Smith RobertKikel, Stephen, MD  DOB: 1980/10/11  PCP: Smith RobertKikel, Stephen, MD  DOS: 05/30/2018  Note by: Oswaldo DoneFrancisco A Misha Vanoverbeke, MD  Service setting: Ambulatory outpatient  Specialty: Interventional Pain Management  Patient type: Established  Location: [facility] (AMB) Pain Management Facility  Visit type: Interventional Procedure   Primary Reason for Visit: Interventional Pain Management Treatment. CC: Back Pain (right, lower)  Procedure:          Anesthesia, Analgesia, Anxiolysis:  Type: Trans-Foraminal Epidural Steroid Injection #1  Purpose: Diagnostic/Therapeutic Region: Posterolateral Lumbosacral Target Area: The 6 o'clock position under the pedicle, on the affected side. Approach: Posterior Percutaneous Paravertebral approach. Level: L4 Level Laterality: Right-Sided Paravertebral  Type: Moderate (Conscious) Sedation combined with Local Anesthesia Indication(s): Analgesia and Anxiety Route: Intravenous (IV) IV Access: Secured Sedation: Meaningful verbal contact was maintained at all times during the procedure  Local Anesthetic: Lidocaine 1-2%  Position: Prone   Indications: 1. DDD (degenerative disc disease), lumbar   2. 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 Lumbar foraminal stenosis (L4-5 and L5-S1) (Bilateral)   4. Chronic lower extremity pain (Bilateral) (L>R)    Pain Score: Pre-procedure: 3 /10 Post-procedure: 0-No pain/10  Pre-op Assessment:  Nathan Lambert is a 37 y.o. (year old), male patient, seen today for interventional treatment. He  has a past surgical history that includes Fracture surgery (Left); Spine surgery (Right, 06/16/2015); and Back surgery. Nathan Lambert has a current medication list which includes the following prescription(s): loratadine, montelukast, naloxone, oxycodone hcl, oxycodone hcl, oxycodone hcl, testosterone cypionate, viagra, and gabapentin, and the following  Facility-Administered Medications: fentanyl, lactated ringers, and midazolam. His primarily concern today is the Back Pain (right, lower)  Initial Vital Signs:  Pulse/HCG Rate: 64ECG Heart Rate: 60 Temp: 97.8 F (36.6 C) Resp: 16 BP: 129/80 SpO2: 100 %  BMI: Estimated body mass index is 37.66 kg/m as calculated from the following:   Height as of this encounter: 5\' 11"  (1.803 m).   Weight as of this encounter: 270 lb (122.5 kg).  Risk Assessment: Allergies: Reviewed. He is allergic to bactrim [sulfamethoxazole-trimethoprim].  Allergy Precautions: None required Coagulopathies: Reviewed. None identified.  Blood-thinner therapy: None at this time Active Infection(s): Reviewed. None identified. Nathan Lambert is afebrile  Site Confirmation: Nathan Lambert was asked to confirm the procedure and laterality before marking the site Procedure checklist: Completed Consent: Before the procedure and under the influence of no sedative(s), amnesic(s), or anxiolytics, the patient was informed of the treatment options, risks and possible complications. To fulfill our ethical and legal obligations, as recommended by the American Medical Association's Code of Ethics, I have informed the patient of my clinical impression; the nature and purpose of the treatment or procedure; the risks, benefits, and possible complications of the intervention; the alternatives, including doing nothing; the risk(s) and benefit(s) of the alternative treatment(s) or procedure(s); and the risk(s) and benefit(s) of doing nothing. The patient was provided information about the general risks and possible complications associated with the procedure. These may include, but are not limited to: failure to achieve desired goals, infection, bleeding, organ or nerve damage, allergic reactions, paralysis, and death. In addition, the patient was informed of those risks and complications associated to Spine-related procedures, such as failure  to decrease pain; infection (i.e.: Meningitis, epidural or intraspinal abscess); bleeding (i.e.: epidural hematoma, subarachnoid hemorrhage, or any other type of intraspinal or peri-dural bleeding); organ or nerve damage (i.e.: Any type of peripheral nerve, nerve  root, or spinal cord injury) with subsequent damage to sensory, motor, and/or autonomic systems, resulting in permanent pain, numbness, and/or weakness of one or several areas of the body; allergic reactions; (i.e.: anaphylactic reaction); and/or death. Furthermore, the patient was informed of those risks and complications associated with the medications. These include, but are not limited to: allergic reactions (i.e.: anaphylactic or anaphylactoid reaction(s)); adrenal axis suppression; blood sugar elevation that in diabetics may result in ketoacidosis or comma; water retention that in patients with history of congestive heart failure may result in shortness of breath, pulmonary edema, and decompensation with resultant heart failure; weight gain; swelling or edema; medication-induced neural toxicity; particulate matter embolism and blood vessel occlusion with resultant organ, and/or nervous system infarction; and/or aseptic necrosis of one or more joints. Finally, the patient was informed that Medicine is not an exact science; therefore, there is also the possibility of unforeseen or unpredictable risks and/or possible complications that may result in a catastrophic outcome. The patient indicated having understood very clearly. We have given the patient no guarantees and we have made no promises. Enough time was given to the patient to ask questions, all of which were answered to the patient's satisfaction. Nathan Lambert has indicated that he wanted to continue with the procedure. Attestation: I, the ordering provider, attest that I have discussed with the patient the benefits, risks, side-effects, alternatives, likelihood of achieving goals, and  potential problems during recovery for the procedure that I have provided informed consent. Date  Time: 05/30/2018  9:31 AM  Pre-Procedure Preparation:  Monitoring: As per clinic protocol. Respiration, ETCO2, SpO2, BP, heart rate and rhythm monitor placed and checked for adequate function Safety Precautions: Patient was assessed for positional comfort and pressure points before starting the procedure. Time-out: I initiated and conducted the "Time-out" before starting the procedure, as per protocol. The patient was asked to participate by confirming the accuracy of the "Time Out" information. Verification of the correct person, site, and procedure were performed and confirmed by me, the nursing staff, and the patient. "Time-out" conducted as per Joint Commission's Universal Protocol (UP.01.01.01). Time: 0959  Description of Procedure:          Area Prepped: Entire Posterior Lumbosacral Area Prepping solution: ChloraPrep (2% chlorhexidine gluconate and 70% isopropyl alcohol) Safety Precautions: Aspiration looking for blood return was conducted prior to all injections. At no point did we inject any substances, as a needle was being advanced. No attempts were made at seeking any paresthesias. Safe injection practices and needle disposal techniques used. Medications properly checked for expiration dates. SDV (single dose vial) medications used. Description of the Procedure: Protocol guidelines were followed. The patient was placed in position over the procedure table. The target area was identified and the area prepped in the usual manner. Skin & deeper tissues infiltrated with local anesthetic. Appropriate amount of time allowed to pass for local anesthetics to take effect. The procedure needles were then advanced to the target area. Proper needle placement secured. Negative aspiration confirmed. Solution injected in intermittent fashion, asking for systemic symptoms every 0.5cc of injectate. The needles  were then removed and the area cleansed, making sure to leave some of the prepping solution back to take advantage of its long term bactericidal properties.  Vitals:   05/30/18 1008 05/30/18 1016 05/30/18 1026 05/30/18 1036  BP: 116/69 113/71 108/80 120/80  Pulse:      Resp: 16 13 16 16   Temp:  97.7 F (36.5 C)  (!) 97.5 F (36.4 C)  TempSrc:  Temporal  Temporal  SpO2: 96% 98% 99% 100%  Weight:      Height:        Start Time: 0959 hrs. End Time: 1008 hrs.  Materials:  Needle(s) Type: Spinal Needle Gauge: 22G Length: 3.5-in Medication(s): Please see orders for medications and dosing details.  Imaging Guidance (Spinal):          Type of Imaging Technique: Fluoroscopy Guidance (Spinal) Indication(s): Assistance in needle guidance and placement for procedures requiring needle placement in or near specific anatomical locations not easily accessible without such assistance. Exposure Time: Please see nurses notes. Contrast: Before injecting any contrast, we confirmed that the patient did not have an allergy to iodine, shellfish, or radiological contrast. Once satisfactory needle placement was completed at the desired level, radiological contrast was injected. Contrast injected under live fluoroscopy. No contrast complications. See chart for type and volume of contrast used. Fluoroscopic Guidance: I was personally present during the use of fluoroscopy. "Tunnel Vision Technique" used to obtain the best possible view of the target area. Parallax error corrected before commencing the procedure. "Direction-depth-direction" technique used to introduce the needle under continuous pulsed fluoroscopy. Once target was reached, antero-posterior, oblique, and lateral fluoroscopic projection used confirm needle placement in all planes. Images permanently stored in EMR. Interpretation: I personally interpreted the imaging intraoperatively. Adequate needle placement confirmed in multiple planes. Appropriate  spread of contrast into desired area was observed. No evidence of afferent or efferent intravascular uptake. No intrathecal or subarachnoid spread observed. Permanent images saved into the patient's record.  Antibiotic Prophylaxis:   Anti-infectives (From admission, onward)   None     Indication(s): None identified  Post-operative Assessment:  Post-procedure Vital Signs:  Pulse/HCG Rate: 6468 Temp: (!) 97.5 F (36.4 C) Resp: 16 BP: 120/80 SpO2: 100 %  EBL: None  Complications: No immediate post-treatment complications observed by team, or reported by patient.  Note: The patient tolerated the entire procedure well. A repeat set of vitals were taken after the procedure and the patient was kept under observation following institutional policy, for this type of procedure. Post-procedural neurological assessment was performed, showing return to baseline, prior to discharge. The patient was provided with post-procedure discharge instructions, including a section on how to identify potential problems. Should any problems arise concerning this procedure, the patient was given instructions to immediately contact us, at any time, without hesitation. In any case, we plan to contact the patient by telephone for a follow-up status report regarding this interventional procedure.  Comments:  No additional relevant information.  Plan of Care    Imaging Orders     DG C-Arm 1-60 Min-No Report  Procedure Orders     Lumbar Transforaminal Epidural  Medications ordered for procedure: Meds ordered this encounter  Medications  . iopamidol (ISOVUE-M) 41 % intrathecal injection 10 mL    Must be Myelogram-compatible. If not available, you may substitute with a water-soluble, non-ionic, hypoallergenic, myelogram-compatible radiological contrast medium.  Marland Kitchen lidocaine (XYLOCAINE) 2 % (with pres) injection 400 mg  . midazolam (VERSED) 5 MG/5ML injection 1-2 mg    Make sure Flumazenil is available in the  pyxis when using this medication. If oversedation occurs, administer 0.2 mg IV over 15 sec. If after 45 sec no response, administer 0.2 mg again over 1 min; may repeat at 1 min intervals; not to exceed 4 doses (1 mg)  . fentaNYL (SUBLIMAZE) injection 25-50 mcg    Make sure Narcan is available in the pyxis when using this medication.  In the event of respiratory depression (RR< 8/min): Titrate NARCAN (naloxone) in increments of 0.1 to 0.2 mg IV at 2-3 minute intervals, until desired degree of reversal.  . lactated ringers infusion 1,000 mL  . sodium chloride flush (NS) 0.9 % injection 1 mL  . ropivacaine (PF) 2 mg/mL (0.2%) (NAROPIN) injection 1 mL  . dexamethasone (DECADRON) injection 10 mg   Medications administered: We administered iopamidol, lidocaine, midazolam, fentaNYL, lactated ringers, sodium chloride flush, ropivacaine (PF) 2 mg/mL (0.2%), and dexamethasone.  See the medical record for exact dosing, route, and time of administration.  Disposition: Discharge home  Discharge Date & Time: 05/30/2018; 1036 hrs.   Physician-requested Follow-up: Return for post-procedure eval (2 wks), w/ Dr. Laban Emperor.  Future Appointments  Date Time Provider Department Center  06/21/2018  9:15 AM Delano Metz, MD [facility] None  08/03/2018 10:45 AM Barbette Merino, NP [facility] None   Primary Care Physician: Smith Robert, MD Location: [facility] Note by: Oswaldo Done, MD Date: 05/30/2018; Time: 10:44 AM  Disclaimer:  Medicine is not an exact science. The only guarantee in medicine is that nothing is guaranteed. It is important to note that the decision to proceed with this intervention was based on the information collected from the patient. The Data and conclusions were drawn from the patient's questionnaire, the interview, and the physical examination. Because the information was provided in large part by the patient, it cannot be guaranteed that it has not  been purposely or unconsciously manipulated. Every effort has been made to obtain as much relevant data as possible for this evaluation. It is important to note that the conclusions that lead to this procedure are derived in large part from the available data. Always take into account that the treatment will also be dependent on availability of resources and existing treatment guidelines, considered by other Pain Management Practitioners as being common knowledge and practice, at the time of the intervention. For Medico-Legal purposes, it is also important to point out that variation in procedural techniques and pharmacological choices are the acceptable norm. The indications, contraindications, technique, and results of the above procedure should only be interpreted and judged by a Board-Certified Interventional Pain Specialist with extensive familiarity and expertise in the same exact procedure and technique.

## 2018-05-30 NOTE — Patient Instructions (Signed)

## 2018-05-30 NOTE — Progress Notes (Signed)
Safety precautions to be maintained throughout the outpatient stay will include: orient to surroundings, keep bed in low position, maintain call bell within reach at all times, provide assistance with transfer out of bed and ambulation.  

## 2018-06-01 ENCOUNTER — Telehealth: Payer: Self-pay | Admitting: *Deleted

## 2018-06-01 NOTE — Telephone Encounter (Signed)
No problems post procedure. 

## 2018-06-13 ENCOUNTER — Other Ambulatory Visit: Payer: Self-pay | Admitting: Pain Medicine

## 2018-06-13 DIAGNOSIS — E559 Vitamin D deficiency, unspecified: Secondary | ICD-10-CM

## 2018-06-13 MED ORDER — VITAMIN D3 125 MCG (5000 UT) PO CAPS: 1.0000 | ORAL_CAPSULE | Freq: Every day | ORAL | 5 refills | Status: DC

## 2018-06-13 MED ORDER — MAGNESIUM 500 MG PO CAPS: 500.0000 mg | ORAL_CAPSULE | Freq: Two times a day (BID) | ORAL | 5 refills | Status: DC

## 2018-06-13 MED ORDER — ERGOCALCIFEROL 1.25 MG (50000 UT) PO CAPS: 50000.0000 [IU] | ORAL_CAPSULE | ORAL | 0 refills | Status: DC

## 2018-06-13 MED ORDER — GNP CALCIUM 1200 1200-1000 MG-UNIT PO CHEW: 1200.0000 mg | CHEWABLE_TABLET | Freq: Every day | ORAL | 5 refills | Status: DC

## 2018-06-21 ENCOUNTER — Other Ambulatory Visit: Payer: Self-pay

## 2018-06-21 ENCOUNTER — Encounter: Payer: Self-pay | Admitting: Pain Medicine

## 2018-06-21 ENCOUNTER — Ambulatory Visit: Payer: BLUE CROSS/BLUE SHIELD | Attending: Pain Medicine | Admitting: Pain Medicine

## 2018-06-21 VITALS — BP 116/73 | HR 65 | Temp 98.2°F | Ht 71.0 in | Wt 270.0 lb

## 2018-06-21 DIAGNOSIS — G8929 Other chronic pain: Secondary | ICD-10-CM

## 2018-06-21 DIAGNOSIS — M961 Postlaminectomy syndrome, not elsewhere classified: Secondary | ICD-10-CM | POA: Insufficient documentation

## 2018-06-21 DIAGNOSIS — F17210 Nicotine dependence, cigarettes, uncomplicated: Secondary | ICD-10-CM | POA: Diagnosis not present

## 2018-06-21 DIAGNOSIS — M47816 Spondylosis without myelopathy or radiculopathy, lumbar region: Secondary | ICD-10-CM | POA: Insufficient documentation

## 2018-06-21 DIAGNOSIS — M5441 Lumbago with sciatica, right side: Secondary | ICD-10-CM

## 2018-06-21 DIAGNOSIS — M5442 Lumbago with sciatica, left side: Secondary | ICD-10-CM | POA: Diagnosis not present

## 2018-06-21 DIAGNOSIS — M25562 Pain in left knee: Secondary | ICD-10-CM | POA: Diagnosis not present

## 2018-06-21 DIAGNOSIS — M25569 Pain in unspecified knee: Secondary | ICD-10-CM | POA: Diagnosis not present

## 2018-06-21 DIAGNOSIS — Z79899 Other long term (current) drug therapy: Secondary | ICD-10-CM | POA: Insufficient documentation

## 2018-06-21 DIAGNOSIS — M79605 Pain in left leg: Secondary | ICD-10-CM

## 2018-06-21 DIAGNOSIS — N539 Unspecified male sexual dysfunction: Secondary | ICD-10-CM | POA: Diagnosis not present

## 2018-06-21 DIAGNOSIS — M549 Dorsalgia, unspecified: Secondary | ICD-10-CM | POA: Diagnosis present

## 2018-06-21 DIAGNOSIS — M533 Sacrococcygeal disorders, not elsewhere classified: Secondary | ICD-10-CM | POA: Insufficient documentation

## 2018-06-21 DIAGNOSIS — M5136 Other intervertebral disc degeneration, lumbar region: Secondary | ICD-10-CM | POA: Diagnosis not present

## 2018-06-21 DIAGNOSIS — M79604 Pain in right leg: Secondary | ICD-10-CM | POA: Diagnosis not present

## 2018-06-21 DIAGNOSIS — E559 Vitamin D deficiency, unspecified: Secondary | ICD-10-CM | POA: Diagnosis not present

## 2018-06-21 DIAGNOSIS — Z881 Allergy status to other antibiotic agents status: Secondary | ICD-10-CM | POA: Diagnosis not present

## 2018-06-21 DIAGNOSIS — Z79891 Long term (current) use of opiate analgesic: Secondary | ICD-10-CM | POA: Diagnosis not present

## 2018-06-21 DIAGNOSIS — M48061 Spinal stenosis, lumbar region without neurogenic claudication: Secondary | ICD-10-CM | POA: Insufficient documentation

## 2018-06-21 DIAGNOSIS — G894 Chronic pain syndrome: Secondary | ICD-10-CM | POA: Diagnosis not present

## 2018-06-21 DIAGNOSIS — Z833 Family history of diabetes mellitus: Secondary | ICD-10-CM | POA: Diagnosis not present

## 2018-06-21 NOTE — Patient Instructions (Signed)
____________________________________________________________________________________________  Preparing for Procedure with Sedation  Instructions: . Oral Intake: Do not eat or drink anything for at least 8 hours prior to your procedure. . Transportation: Public transportation is not allowed. Bring an adult driver. The driver must be physically present in our waiting room before any procedure can be started. . Physical Assistance: Bring an adult physically capable of assisting you, in the event you need help. This adult should keep you company at home for at least 6 hours after the procedure. . Blood Pressure Medicine: Take your blood pressure medicine with a sip of water the morning of the procedure. . Blood thinners: Notify our staff if you are taking any blood thinners. Depending on which one you take, there will be specific instructions on how and when to stop it. . Diabetics on insulin: Notify the staff so that you can be scheduled 1st case in the morning. If your diabetes requires high dose insulin, take only  of your normal insulin dose the morning of the procedure and notify the staff that you have done so. . Preventing infections: Shower with an antibacterial soap the morning of your procedure. . Build-up your immune system: Take 1000 mg of Vitamin C with every meal (3 times a day) the day prior to your procedure. . Antibiotics: Inform the staff if you have a condition or reason that requires you to take antibiotics before dental procedures. . Pregnancy: If you are pregnant, call and cancel the procedure. . Sickness: If you have a cold, fever, or any active infections, call and cancel the procedure. . Arrival: You must be in the facility at least 30 minutes prior to your scheduled procedure. . Children: Do not bring children with you. . Dress appropriately: Bring dark clothing that you would not mind if they get stained. . Valuables: Do not bring any jewelry or valuables.  Procedure  appointments are reserved for interventional treatments only. . No Prescription Refills. . No medication changes will be discussed during procedure appointments. . No disability issues will be discussed.  Reasons to call and reschedule or cancel your procedure: (Following these recommendations will minimize the risk of a serious complication.) . Surgeries: Avoid having procedures within 2 weeks of any surgery. (Avoid for 2 weeks before or after any surgery). . Flu Shots: Avoid having procedures within 2 weeks of a flu shots or . (Avoid for 2 weeks before or after immunizations). . Barium: Avoid having a procedure within 7-10 days after having had a radiological study involving the use of radiological contrast. (Myelograms, Barium swallow or enema study). . Heart attacks: Avoid any elective procedures or surgeries for the initial 6 months after a "Myocardial Infarction" (Heart Attack). . Blood thinners: It is imperative that you stop these medications before procedures. Let us know if you if you take any blood thinner.  . Infection: Avoid procedures during or within two weeks of an infection (including chest colds or gastrointestinal problems). Symptoms associated with infections include: Localized redness, fever, chills, night sweats or profuse sweating, burning sensation when voiding, cough, congestion, stuffiness, runny nose, sore throat, diarrhea, nausea, vomiting, cold or Flu symptoms, recent or current infections. It is specially important if the infection is over the area that we intend to treat. . Heart and lung problems: Symptoms that may suggest an active cardiopulmonary problem include: cough, chest pain, breathing difficulties or shortness of breath, dizziness, ankle swelling, uncontrolled high or unusually low blood pressure, and/or palpitations. If you are experiencing any of these symptoms, cancel   your procedure and contact your primary care physician for an evaluation.  Remember:   Regular Business hours are:  Monday to Thursday 8:00 AM to 4:00 PM  Provider's Schedule: Shahd Occhipinti, MD:  Procedure days: Tuesday and Thursday 7:30 AM to 4:00 PM  Bilal Lateef, MD:  Procedure days: Monday and Wednesday 7:30 AM to 4:00 PM ____________________________________________________________________________________________    

## 2018-06-21 NOTE — Progress Notes (Signed)
Patient's Name: Nathan Lambert.  MRN: [id number]  Referring Provider: Vidal Schwalbe, MD  DOB: 27-Jul-1980  PCP: Nathan Schwalbe, MD  DOS: 06/21/2018  Note by: Gaspar Cola, MD  Service setting: Ambulatory outpatient  Specialty: Interventional Pain Management  Location: [facility] (AMB) Pain Management Facility    Patient type: Established   Primary Reason(s) for Visit: Encounter for post-procedure evaluation of chronic illness with mild to moderate exacerbation CC: Back Pain  HPI  Nathan Lambert is a 37 y.o. year old, male patient, who comes today for a post-procedure evaluation. 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 His primarily concern today is the Back Pain  Pain Assessment: Location: Right, Lower Back Radiating: right upper leg Onset: More than a month ago Duration: Chronic pain Quality: Aching Severity: 2 /10 (subjective, self-reported pain score)  Note: Reported level is compatible with observation.                          When using our objective Pain Scale, levels between 6 and 10/10 are said to belong in an emergency room, as it progressively worsens from a 6/10, described as severely limiting, requiring emergency care not usually available at an outpatient pain management facility. At a 6/10 level, communication becomes difficult and requires great effort. Assistance to reach the emergency department may be required. Facial flushing and profuse sweating along with potentially dangerous increases in heart rate and blood pressure will be evident. Effect on ADL: limits my daily activities Timing: Constant Modifying factors: medicaction and laying down BP: 116/73  HR: 65  Nathan Lambert comes in today for post-procedure evaluation.  Further details on both, my assessment(s), as well as the proposed treatment plan, please see below.  Post-Procedure Assessment  05/30/2018 Procedure: Diagnostic right L4 TFESI #1 under fluoro and IV sedation. Pre-procedure pain score:  3/10 Post-procedure pain score: 0/10 (100% relief) Influential Factors: BMI: 37.66 kg/m Intra-procedural challenges: None observed.         Assessment challenges: None detected.              Reported side-effects: None.        Post-procedural adverse reactions or complications: None reported         Sedation: Sedation provided. When no sedatives are used, the analgesic levels obtained are directly associated to the effectiveness of the local anesthetics. However, when sedation is provided, the level of analgesia obtained during the initial 1 hour following the intervention, is believed to be the result of a combination of factors. These factors may include, but are not limited to: 1. The effectiveness of the local anesthetics used. 2. The effects of the analgesic(s) and/or anxiolytic(s) used. 3. The degree of discomfort experienced by the patient at the time of the  procedure. 4. The patients ability and reliability in recalling and recording the  events. 5. The presence and influence of possible secondary gains and/or psychosocial factors. Reported result: Relief experienced during the 1st hour after the procedure: 100 % (Ultra-Short Term Relief)            Interpretative annotation: Clinically appropriate result. Analgesia during this period is likely to be Local Anesthetic and/or IV Sedative (Analgesic/Anxiolytic) related.          Effects of local anesthetic: The analgesic effects attained during this period are directly associated to the localized infiltration of local anesthetics and therefore cary significant diagnostic value as to the etiological location, or anatomical origin, of the pain. Expected duration of relief is directly dependent on the pharmacodynamics of the local anesthetic used. Long-acting (4-6 hours) anesthetics used.  Reported result: Relief during the next 4 to 6 hour after the procedure: 50 % (Short-Term Relief)            Interpretative annotation: Clinically appropriate result. Analgesia during this period is likely to be Local Anesthetic-related.          Long-term benefit: Defined as the period of time past the expected duration of local anesthetics (1 hour for short-acting and 4-6 hours for long-acting). With the possible exception of prolonged sympathetic blockade from the local anesthetics, benefits during this period are typically attributed to, or associated with, other factors such as analgesic sensory neuropraxia, antiinflammatory effects, or beneficial biochemical changes provided by agents other than the local anesthetics.  Reported result: Extended relief following procedure: 100 % (Long-Term Relief)            Interpretative annotation: Clinically possible results. Good relief. No permanent benefit expected. Inflammation plays a part in the etiology to the pain.          Current benefits: Defined as reported results that persistent at this point in time.   Analgesia: 100 %            Function: Somewhat  improved ROM: Somewhat improved Interpretative annotation: Ongoing benefit. Therapeutic benefit observed. Effective therapeutic approach.          Interpretation: Results would suggest a successful diagnostic intervention.                  Plan:  Please see "Plan of Care" for details.                Laboratory Chemistry  Inflammation Markers (CRP: Acute Phase) (ESR: Chronic Phase) Lab Results  Component Value Date   CRP 1.3 (H) 05/17/2018   ESRSEDRATE 10 05/17/2018                         Rheumatology Markers No results found.  Renal Markers Lab Results  Component Value Date   BUN 13 05/17/2018   CREATININE 0.85 05/17/2018   BCR 7 (L) 12/27/2016   GFRAA >60 05/17/2018   GFRNONAA >60 05/17/2018                             Hepatic Markers Lab Results  Component Value Date   AST 13 (L) 05/17/2018   ALT 15 05/17/2018   ALBUMIN 4.1 05/17/2018                        Neuropathy Markers Lab Results  Component Value Date   VITAMINB12 222 05/17/2018  Hematology Parameters No results found.  CV Markers No results found.  Note: Lab results reviewed.  Recent Imaging Results   Results for orders placed in visit on 05/30/18  DG C-Arm 1-60 Min-No Report   Narrative Fluoroscopy was utilized by the requesting physician.  No radiographic  interpretation.    Interpretation Report: Fluoroscopy was used during the procedure to assist with needle guidance. The images were interpreted intraoperatively by the requesting physician.  Meds   Current Outpatient Medications:  .  Calcium Carbonate-Vit D-Min (GNP CALCIUM 1200) 1200-1000 MG-UNIT CHEW, Chew 1,200 mg by mouth daily with breakfast. Take in combination with vitamin D and magnesium., Disp: 30 tablet, Rfl: 5 .  Cholecalciferol (VITAMIN D3) 125 MCG (5000 UT) CAPS, Take 1 capsule (5,000 Units total) by mouth daily with breakfast. Take along with calcium and magnesium., Disp: 30 capsule, Rfl: 5 .   ergocalciferol (VITAMIN D2) 1.25 MG (50000 UT) capsule, Take 1 capsule (50,000 Units total) by mouth 2 (two) times a week. X 6 weeks., Disp: 12 capsule, Rfl: 0 .  loratadine (CLARITIN) 10 MG tablet, Take 10 mg by mouth daily as needed for allergies., Disp: , Rfl:  .  Magnesium 500 MG CAPS, Take 1 capsule (500 mg total) by mouth 2 (two) times daily at 8 am and 10 pm., Disp: 60 capsule, Rfl: 5 .  montelukast (SINGULAIR) 10 MG tablet, Take 10 mg by mouth at bedtime., Disp: , Rfl:  .  naloxone (NARCAN) 2 MG/2ML injection, Inject content of syringe into thigh muscle. Call 911., Disp: 2 Syringe, Rfl: 1 .  [START ON 07/13/2018] Oxycodone HCl 10 MG TABS, Take 1 tablet (10 mg total) by mouth every 6 (six) hours as needed., Disp: 120 tablet, Rfl: 0 .  Oxycodone HCl 10 MG TABS, Take 1 tablet (10 mg total) by mouth every 6 (six) hours as needed., Disp: 120 tablet, Rfl: 0 .  testosterone cypionate (DEPOTESTOSTERONE CYPIONATE) 200 MG/ML injection, Inject into the muscle every 7 (seven) days., Disp: , Rfl:  .  VIAGRA 100 MG tablet, Take 1 tablet (100 mg total) by mouth as needed for erectile dysfunction., Disp: 20 tablet, Rfl: 1 .  gabapentin (NEURONTIN) 300 MG capsule, Take 1-3 capsules (300-900 mg total) by mouth 3 (three) times daily. Follow written titration schedule., Disp: 270 capsule, Rfl: 1 .  Oxycodone HCl 10 MG TABS, Take 1 tablet (10 mg total) by mouth every 6 (six) hours as needed., Disp: 120 tablet, Rfl: 0  ROS  Constitutional: Denies any fever or chills Gastrointestinal: No reported hemesis, hematochezia, vomiting, or acute GI distress Musculoskeletal: Denies any acute onset joint swelling, redness, loss of ROM, or weakness Neurological: No reported episodes of acute onset apraxia, aphasia, dysarthria, agnosia, amnesia, paralysis, loss of coordination, or loss of consciousness  Allergies  Mr. Pollok is allergic to bactrim [sulfamethoxazole-trimethoprim].  PFSH  Drug: Mr. Wotton   reports that he does not use drugs. Alcohol:  reports that he does not drink alcohol. Tobacco:  reports that he has been smoking cigarettes. He has a 10.00 pack-year smoking history. He has never used smokeless tobacco. Medical:  has a past medical history of Allergy (2012), Chronic low back pain (Primary Area of Pain) (Bilateral) (R>L), Degeneration of lumbar or lumbosacral intervertebral disc (L4-L5) (12/27/2016), Headache, and Opiate use (02/15/2017). Surgical: Mr. Hardman  has a past surgical history that includes Fracture surgery (Left); Spine surgery (Right, 06/16/2015); and Back surgery. Family: family history includes AAA (abdominal aortic aneurysm) in his mother; Diabetes in his  father; Stroke in his mother.  Constitutional Exam  General appearance: Well nourished, well developed, and well hydrated. In no apparent acute distress Vitals:   06/21/18 0952  BP: 116/73  Pulse: 65  Temp: 98.2 F (36.8 C)  SpO2: 97%  Weight: 270 lb (122.5 kg)  Height: 5' 11"  (1.803 m)   BMI Assessment: Estimated body mass index is 37.66 kg/m as calculated from the following:   Height as of this encounter: 5' 11"  (1.803 m).   Weight as of this encounter: 270 lb (122.5 kg).  BMI interpretation table: BMI level Category Range association with higher incidence of chronic pain  <18 kg/m2 Underweight   18.5-24.9 kg/m2 Ideal body weight   25-29.9 kg/m2 Overweight Increased incidence by 20%  30-34.9 kg/m2 Obese (Class I) Increased incidence by 68%  35-39.9 kg/m2 Severe obesity (Class II) Increased incidence by 136%  >40 kg/m2 Extreme obesity (Class III) Increased incidence by 254%   Patient's current BMI Ideal Body weight  Body mass index is 37.66 kg/m. Ideal body weight: 75.3 kg (166 lb 0.1 oz) Adjusted ideal body weight: 94.2 kg (207 lb 9.7 oz)   BMI Readings from Last 4 Encounters:  06/21/18 37.66 kg/m  05/30/18 37.66 kg/m  05/17/18 38.35 kg/m  05/04/18 37.66 kg/m   Wt Readings from  Last 4 Encounters:  06/21/18 270 lb (122.5 kg)  05/30/18 270 lb (122.5 kg)  05/17/18 275 lb (124.7 kg)  05/04/18 270 lb (122.5 kg)  Psych/Mental status: Alert, oriented x 3 (person, place, & time)       Eyes: PERLA Respiratory: No evidence of acute respiratory distress  Cervical Spine Area Exam  Skin & Axial Inspection: No masses, redness, edema, swelling, or associated skin lesions Alignment: Symmetrical Functional ROM: Unrestricted ROM      Stability: No instability detected Muscle Tone/Strength: Functionally intact. No obvious neuro-muscular anomalies detected. Sensory (Neurological): Unimpaired Palpation: No palpable anomalies              Upper Extremity (UE) Exam    Side: Right upper extremity  Side: Left upper extremity  Skin & Extremity Inspection: Skin color, temperature, and hair growth are WNL. No peripheral edema or cyanosis. No masses, redness, swelling, asymmetry, or associated skin lesions. No contractures.  Skin & Extremity Inspection: Skin color, temperature, and hair growth are WNL. No peripheral edema or cyanosis. No masses, redness, swelling, asymmetry, or associated skin lesions. No contractures.  Functional ROM: Unrestricted ROM          Functional ROM: Unrestricted ROM          Muscle Tone/Strength: Functionally intact. No obvious neuro-muscular anomalies detected.  Muscle Tone/Strength: Functionally intact. No obvious neuro-muscular anomalies detected.  Sensory (Neurological): Unimpaired          Sensory (Neurological): Unimpaired          Palpation: No palpable anomalies              Palpation: No palpable anomalies              Provocative Test(s):  Phalen's test: deferred Tinel's test: deferred Apley's scratch test (touch opposite shoulder):  Action 1 (Across chest): deferred Action 2 (Overhead): deferred Action 3 (LB reach): deferred   Provocative Test(s):  Phalen's test: deferred Tinel's test: deferred Apley's scratch test (touch opposite shoulder):   Action 1 (Across chest): deferred Action 2 (Overhead): deferred Action 3 (LB reach): deferred    Thoracic Spine Area Exam  Skin & Axial Inspection: No masses, redness, or swelling Alignment:  Symmetrical Functional ROM: Unrestricted ROM Stability: No instability detected Muscle Tone/Strength: Functionally intact. No obvious neuro-muscular anomalies detected. Sensory (Neurological): Unimpaired Muscle strength & Tone: No palpable anomalies  Lumbar Spine Area Exam  Skin & Axial Inspection: No masses, redness, or swelling Alignment: Symmetrical Functional ROM: Unrestricted ROM       Stability: No instability detected Muscle Tone/Strength: Functionally intact. No obvious neuro-muscular anomalies detected. Sensory (Neurological): Unimpaired Palpation: No palpable anomalies       Provocative Tests: Hyperextension/rotation test: deferred today       Lumbar quadrant test (Kemp's test): deferred today       Lateral bending test: deferred today       Patrick's Maneuver: deferred today                   FABER test: deferred today                   S-I anterior distraction/compression test: deferred today         S-I lateral compression test: deferred today         S-I Thigh-thrust test: deferred today         S-I Gaenslen's test: deferred today          Gait & Posture Assessment  Ambulation: Unassisted Gait: Relatively normal for age and body habitus Posture: WNL   Lower Extremity Exam    Side: Right lower extremity  Side: Left lower extremity  Stability: No instability observed          Stability: No instability observed          Skin & Extremity Inspection: Skin color, temperature, and hair growth are WNL. No peripheral edema or cyanosis. No masses, redness, swelling, asymmetry, or associated skin lesions. No contractures.  Skin & Extremity Inspection: Skin color, temperature, and hair growth are WNL. No peripheral edema or cyanosis. No masses, redness, swelling, asymmetry, or  associated skin lesions. No contractures.  Functional ROM: Unrestricted ROM                  Functional ROM: Unrestricted ROM                  Muscle Tone/Strength: Functionally intact. No obvious neuro-muscular anomalies detected.  Muscle Tone/Strength: Functionally intact. No obvious neuro-muscular anomalies detected.  Sensory (Neurological): Unimpaired        Sensory (Neurological): Unimpaired        DTR: Patellar: deferred today Achilles: deferred today Plantar: deferred today  DTR: Patellar: deferred today Achilles: deferred today Plantar: deferred today  Palpation: No palpable anomalies  Palpation: No palpable anomalies   Assessment  Primary Diagnosis & Pertinent Problem List: The primary encounter diagnosis was Chronic low back pain (Primary Area of Pain) (Bilateral) (R>L). 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  Status Diagnosis  Improved Controlled Improved 1. Chronic low back pain (Primary Area of Pain) (Bilateral) (R>L)   2. Chronic knee pain (Secondary Area of pain) (Left)   3. Lumbar facet syndrome (Bilateral) (R>L)   4. DDD (degenerative disc disease), lumbar   5. Chronic lower extremity pain (Bilateral) (L>R)   6. 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 Plan of Care  Pharmacotherapy (Medications Ordered): No orders of  the defined types were placed in this encounter.  Medications administered today: Fawaz L. Cornette Jr. had no medications administered during this visit.   Procedure Orders     Lumbar Transforaminal Epidural Lab Orders  No laboratory test(s) ordered today   Imaging Orders  No imaging studies ordered today    Referral Orders  No referral(s) requested today   Interventional management options: Planned, scheduled, and/or pending:   None at this time   Considering:   Diagnostic bilateral lumbar facet block Possible bilateral lumbar facet RFA Diagnostic bilateral sacroiliac joint block Possible bilateral sacroiliac joint RFA Diagnostic caudal epidural steroid injection + diagnostic epidurogram Possible Racz procedure Diagnostic left intra-articular knee injection with local anesthetic and steroid Possible left series of 5 intra-articular Hyalgan knee injections Diagnostic left Genicular nerve block Possible left Genicular nerve RFA   Palliative PRN treatment(s):   Palliative right-sided lumbar facet + sacroiliac joint RFA #2 (last one done on 10/11/2017) Palliative left-sided lumbar facet RFA #2 (last one done on 11/24/2017)  Diagnostic right L4 TFES2 #1 under fluoro and IV sedation.   Provider-requested follow-up: Return for PRN Procedure: (R) L4 TFESI #2.  Future Appointments  Date Time Provider [place]  08/03/2018 10:45 AM Vevelyn Francois, NP [facility] None   Primary Care Physician: Nathan Schwalbe, MD Location: [facility] Outpatient Pain Management Facility Note by: Gaspar Cola, MD Date: 06/21/2018; Time: 10:00 AM

## 2018-08-03 ENCOUNTER — Other Ambulatory Visit: Payer: Self-pay

## 2018-08-03 ENCOUNTER — Ambulatory Visit: Payer: BLUE CROSS/BLUE SHIELD | Attending: Nurse Practitioner | Admitting: Nurse Practitioner

## 2018-08-03 ENCOUNTER — Encounter: Payer: Self-pay | Admitting: Nurse Practitioner

## 2018-08-03 VITALS — BP 125/79 | HR 81 | Temp 98.4°F | Ht 71.0 in | Wt 270.0 lb

## 2018-08-03 DIAGNOSIS — M79602 Pain in left arm: Secondary | ICD-10-CM | POA: Diagnosis present

## 2018-08-03 DIAGNOSIS — Z79891 Long term (current) use of opiate analgesic: Secondary | ICD-10-CM | POA: Insufficient documentation

## 2018-08-03 DIAGNOSIS — M5137 Other intervertebral disc degeneration, lumbosacral region: Secondary | ICD-10-CM | POA: Diagnosis present

## 2018-08-03 DIAGNOSIS — M533 Sacrococcygeal disorders, not elsewhere classified: Secondary | ICD-10-CM

## 2018-08-03 DIAGNOSIS — G8929 Other chronic pain: Secondary | ICD-10-CM | POA: Diagnosis present

## 2018-08-03 DIAGNOSIS — M47816 Spondylosis without myelopathy or radiculopathy, lumbar region: Secondary | ICD-10-CM | POA: Diagnosis present

## 2018-08-03 DIAGNOSIS — G894 Chronic pain syndrome: Secondary | ICD-10-CM | POA: Diagnosis present

## 2018-08-03 MED ORDER — OXYCODONE HCL 10 MG PO TABS: 10.0000 mg | ORAL_TABLET | Freq: Four times a day (QID) | ORAL | 0 refills | Status: DC | PRN

## 2018-08-03 NOTE — Progress Notes (Signed)
Nursing Pain Medication Assessment:  Safety precautions to be maintained throughout the outpatient stay will include: orient to surroundings, keep bed in low position, maintain call bell within reach at all times, provide assistance with transfer out of bed and ambulation.  Medication Inspection Compliance: Pill count conducted under aseptic conditions, in front of the patient. Neither the pills nor the bottle was removed from the patient's sight at any time. Once count was completed pills were immediately returned to the patient in their original bottle.  Medication: Oxycodone IR Pill/Patch Count: 35 of 120 pills remain Pill/Patch Appearance: Markings consistent with prescribed medication Bottle Appearance: Standard pharmacy container. Clearly labeled. Filled Date: 48 / 26 / 2019 Last Medication intake:  Today

## 2018-08-03 NOTE — Progress Notes (Signed)
Patient's Name: Nathan Lambert.  MRN: [id number]  Referring Provider: Vidal Schwalbe, MD  DOB: July 26, 1980  PCP: Vidal Schwalbe, MD  DOS: 08/03/2018  Note by: Vevelyn Francois NP  Service setting: Ambulatory outpatient  Specialty: Interventional Pain Management  Location: [facility] (AMB) Pain Management Facility    Patient type: Established    Primary Reason(s) for Visit: Encounter for prescription drug management. (Level of risk: moderate)  CC: Back Pain  HPI  Nathan Lambert is a 38 y.o. year old, male patient, who comes today for a medication management evaluation. 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 His primarily concern today is the Back Pain  Pain Assessment: Location: Lower, Right Back Radiating: pain radiaties down right leg Onset: More than a month ago Duration: Chronic pain Quality: Aching Severity: 2 /10 (subjective, self-reported pain score)  Note: Reported level is compatible with observation.                           Effect on ADL: limits my daily activities Timing: Constant Modifying factors: medication and laying down BP: 125/79  HR: 81  Nathan Lambert was last scheduled for an appointment on 05/04/2018 for medication management. During today's appointment we reviewed Nathan Lambert chronic pain status, as well as his outpatient medication regimen. He is having left arm numbness at night while sleeping. It is relieved with shaking or allowing it to hang off the bed.   The patient  reports no history of drug use. His body mass index is 37.66 kg/m.  Further details on both, my assessment(s), as well as the proposed treatment plan, please see below.  Controlled Substance Pharmacotherapy Assessment REMS (Risk Evaluation and Mitigation Strategy)  Analgesic:oxycodone 10 mg 4 times daily MME/day:38m/day. Nathan Fischer RN  08/03/2018 11:21 AM  Sign when Signing Visit Nursing Pain Medication Assessment:  Safety precautions to be maintained throughout the outpatient stay will include: orient to surroundings, keep bed in low position, maintain call bell within reach at all times, provide assistance with transfer out of bed and ambulation.  Medication Inspection Compliance: Pill count conducted under aseptic conditions, in front of the patient. Neither the pills nor the bottle was removed from the patient's sight at any time. Once count was completed pills were immediately returned to the patient in their original bottle.  Medication: Oxycodone IR Pill/Patch Count: 35 of 120 pills remain Pill/Patch Appearance: Markings consistent with prescribed medication Bottle Appearance: Standard pharmacy container. Clearly labeled. Filled Date: 160/ 26 / 2019 Last Medication intake:  Today   Pharmacokinetics: Liberation and absorption (onset of action): WNL Distribution (time to peak effect): WNL Metabolism and excretion (duration of action): WNL         Pharmacodynamics: Desired effects: Analgesia: Mr.  Lambert reports >50% benefit. Functional ability: Patient reports that medication allows him to accomplish basic ADLs Clinically meaningful improvement in function (CMIF): Sustained CMIF goals met Perceived effectiveness: Described as relatively effective, allowing for increase in activities of daily living (ADL) Undesirable effects: Side-effects or Adverse reactions: None reported Monitoring: [place] PMP: Online review of the past 33-monthperiod conducted. Compliant with practice rules and regulations Last UDS on record: Summary  Date Value Ref Range Status  05/04/2018 FINAL  Final    Comment:    ==================================================================== TOXASSURE SELECT 13 (MW) ==================================================================== Test                             Result       Flag       Units Drug Present and Declared for Prescription Verification   Oxycodone                      594          EXPECTED   ng/mg creat   Oxymorphone                    523          EXPECTED   ng/mg creat   Noroxycodone                   726          EXPECTED   ng/mg creat   Noroxymorphone                 141          EXPECTED   ng/mg creat    Sources of oxycodone are scheduled prescription medications.    Oxymorphone, noroxycodone, and noroxymorphone are expected    metabolites of oxycodone. Oxymorphone is also available as a    scheduled prescription medication. ==================================================================== Test                      Result    Flag   Units      Ref Range   Creatinine              135              mg/dL      >=20 ==================================================================== Declared Medications:  The flagging and interpretation on this report are based on the  following declared medications.  Unexpected results may arise from  inaccuracies in the declared medications.  **Note: The testing scope of this panel includes these  medications:  Oxycodone  **Note: The testing scope of this panel does not include following  reported medications:  Gabapentin  Loratadine  Montelukast (Singulair)  Naloxone (Narcan)  Sildenafil (Viagra)  Testosterone ==================================================================== For clinical consultation, please call ([phone] ====================================================================    UDS interpretation: Compliant          Medication Assessment Form: Reviewed. Patient indicates being compliant with therapy Treatment compliance: Compliant Risk Assessment Profile: Aberrant behavior: See prior evaluations. None observed or detected today Comorbid factors increasing risk of overdose: See prior notes. No additional risks detected today Opioid risk tool (ORT) (Total Score): 4 Personal History of Substance Abuse (SUD-Substance use disorder):  Alcohol: Positive Male or Male  Illegal Drugs: Negative  Rx Drugs: Negative  ORT Risk Level calculation: Moderate Risk Risk of substance use disorder (SUD): Low Opioid Risk Tool - 08/03/18 1116      Family History of  Substance Abuse   Alcohol  Negative    Illegal Drugs  Negative    Rx Drugs  Negative      Personal History of Substance Abuse   Alcohol  Positive Male or Male    Illegal Drugs  Negative    Rx Drugs  Negative      Age   Age between 97-45 years   Yes      History of Preadolescent Sexual Abuse   History of Preadolescent Sexual Abuse  Negative or Male      Psychological Disease   Psychological Disease  Negative    Depression  Negative      Total Score   Opioid Risk Tool Scoring  4    Opioid Risk Interpretation  Moderate Risk      ORT Scoring interpretation table:  Score <3 = Low Risk for SUD  Score between 4-7 = Moderate Risk for SUD  Score >8 = High Risk for Opioid Abuse   Risk Mitigation Strategies:  Patient Counseling: Covered Patient-Prescriber Agreement (PPA): Present and active   Notification to other healthcare providers: Done  Pharmacologic Plan: No change in therapy, at this time.             Laboratory Chemistry  Inflammation Markers (CRP: Acute Phase) (ESR: Chronic Phase) Lab Results  Component Value Date   CRP 1.3 (H) 05/17/2018   ESRSEDRATE 10 05/17/2018                         Rheumatology Markers No results found for: RF, ANA, LABURIC, URICUR, LYMEIGGIGMAB, LYMEABIGMQN, HLAB27                      Renal Function Markers Lab Results  Component Value Date   BUN 13 05/17/2018   CREATININE 0.85 05/17/2018   BCR 7 (L) 12/27/2016   GFRAA >60 05/17/2018   GFRNONAA >60 05/17/2018                             Hepatic Function Markers Lab Results  Component Value Date   AST 13 (L) 05/17/2018   ALT 15 05/17/2018   ALBUMIN 4.1 05/17/2018   ALKPHOS 47 05/17/2018                        Electrolytes Lab Results  Component Value Date   NA 139 05/17/2018   K 4.3 05/17/2018   CL 107 05/17/2018   CALCIUM 8.9 05/17/2018   MG 2.0 05/17/2018                        Neuropathy Markers Lab Results  Component Value Date   TGGYIRSW54 627 05/17/2018                        CNS Tests No results found for: COLORCSF, APPEARCSF, RBCCOUNTCSF, WBCCSF, POLYSCSF, LYMPHSCSF, EOSCSF, PROTEINCSF, GLUCCSF, JCVIRUS, CSFOLI, IGGCSF                      Bone Pathology Markers Lab Results  Component Value Date   VD25OH 18.6 (L) 05/17/2018   25OHVITD1 17 (L) 12/27/2016   25OHVITD2 <1.0 12/27/2016   25OHVITD3 17 12/27/2016   TESTOFREE 2.6 (L) 06/08/2017   TESTOSTERONE 116 (L) 06/08/2017  Coagulation Parameters No results found for: INR, LABPROT, APTT, PLT, DDIMER, LABHEMA, VITAMINK1                      Cardiovascular Markers No results found for: BNP, CKTOTAL, CKMB, TROPONINI, HGB, HCT                       CA Markers No results found for: CEA, CA125, LABCA2                      Note: Lab results reviewed.  Recent Diagnostic  Imaging Results  DG C-Arm 1-60 Min-No Report Fluoroscopy was utilized by the requesting physician.  No radiographic  interpretation.   Complexity Note: Imaging results reviewed. Results shared with Nathan Lambert, using Layman's terms.                         Meds   Current Outpatient Medications:  .  Calcium Carbonate-Vit D-Min (GNP CALCIUM 1200) 1200-1000 MG-UNIT CHEW, Chew 1,200 mg by mouth daily with breakfast. Take in combination with vitamin D and magnesium., Disp: 30 tablet, Rfl: 5 .  Cholecalciferol (VITAMIN D3) 125 MCG (5000 UT) CAPS, Take 1 capsule (5,000 Units total) by mouth daily with breakfast. Take along with calcium and magnesium., Disp: 30 capsule, Rfl: 5 .  loratadine (CLARITIN) 10 MG tablet, Take 10 mg by mouth daily as needed for allergies., Disp: , Rfl:  .  Magnesium 500 MG CAPS, Take 1 capsule (500 mg total) by mouth 2 (two) times daily at 8 am and 10 pm., Disp: 60 capsule, Rfl: 5 .  montelukast (SINGULAIR) 10 MG tablet, Take 10 mg by mouth at bedtime., Disp: , Rfl:  .  naloxone (NARCAN) 2 MG/2ML injection, Inject content of syringe into thigh muscle. Call 911., Disp: 2 Syringe, Rfl: 1 .  [START ON 10/11/2018] Oxycodone HCl 10 MG TABS, Take 1 tablet (10 mg total) by mouth every 6 (six) hours as needed for up to 30 days., Disp: 120 tablet, Rfl: 0 .  testosterone cypionate (DEPOTESTOSTERONE CYPIONATE) 200 MG/ML injection, Inject into the muscle every 7 (seven) days., Disp: , Rfl:  .  VIAGRA 100 MG tablet, Take 1 tablet (100 mg total) by mouth as needed for erectile dysfunction., Disp: 20 tablet, Rfl: 1 .  gabapentin (NEURONTIN) 300 MG capsule, Take 1-3 capsules (300-900 mg total) by mouth 3 (three) times daily. Follow written titration schedule., Disp: 270 capsule, Rfl: 1 .  [START ON 09/11/2018] Oxycodone HCl 10 MG TABS, Take 1 tablet (10 mg total) by mouth every 6 (six) hours as needed for up to 30 days., Disp: 120 tablet, Rfl: 0 .  [START ON 08/12/2018] Oxycodone HCl 10 MG  TABS, Take 1 tablet (10 mg total) by mouth every 6 (six) hours as needed for up to 30 days., Disp: 120 tablet, Rfl: 0  ROS  Constitutional: Denies any fever or chills Gastrointestinal: No reported hemesis, hematochezia, vomiting, or acute GI distress Musculoskeletal: Denies any acute onset joint swelling, redness, loss of ROM, or weakness Neurological: No reported episodes of acute onset apraxia, aphasia, dysarthria, agnosia, amnesia, paralysis, loss of coordination, or loss of consciousness  Allergies  Nathan Lambert is allergic to bactrim [sulfamethoxazole-trimethoprim].  PFSH  Drug: Nathan Lambert  reports no history of drug use. Alcohol:  reports no history of alcohol use. Tobacco:  reports that he has been smoking cigarettes. He has a 10.00 pack-year smoking history.  He has never used smokeless tobacco. Medical:  has a past medical history of Allergy (2012), Chronic low back pain (Primary Area of Pain) (Bilateral) (R>L), Degeneration of lumbar or lumbosacral intervertebral disc (L4-L5) (12/27/2016), Headache, and Opiate use (02/15/2017). Surgical: Nathan Lambert  has a past surgical history that includes Fracture surgery (Left); Spine surgery (Right, 06/16/2015); and Back surgery. Family: family history includes AAA (abdominal aortic aneurysm) in his mother; Diabetes in his father; Stroke in his mother.  Constitutional Exam  General appearance: Well nourished, well developed, and well hydrated. In no apparent acute distress Vitals:   08/03/18 1109  BP: 125/79  Pulse: 81  Temp: 98.4 F (36.9 C)  SpO2: 99%  Weight: 270 lb (122.5 kg)  Height: _0  (1.803 m)  Psych/Mental status: Alert, oriented x 3 (person, place, & time)       Eyes: PERLA Respiratory: No evidence of acute respiratory distress  Cervical Spine Area Exam  Skin & Axial Inspection: No masses, redness, edema, swelling, or associated skin lesions Alignment: Symmetrical Functional ROM: Adequate ROM      Stability:  No instability detected Muscle Tone/Strength: Functionally intact. No obvious neuro-muscular anomalies detected. Sensory (Neurological): Unimpaired Palpation: Increased muscle tone              Upper Extremity (UE) Exam    Side: Right upper extremity  Side: Left upper extremity  Skin & Extremity Inspection: Skin color, temperature, and hair growth are WNL. No peripheral edema or cyanosis. No masses, redness, swelling, asymmetry, or associated skin lesions. No contractures.  Skin & Extremity Inspection: Skin color, temperature, and hair growth are WNL. No peripheral edema or cyanosis. No masses, redness, swelling, asymmetry, or associated skin lesions. No contractures.  Functional ROM: Unrestricted ROM          Functional ROM: Unrestricted ROM          Muscle Tone/Strength: Functionally intact. No obvious neuro-muscular anomalies detected.  Muscle Tone/Strength: Functionally intact. No obvious neuro-muscular anomalies detected.  Sensory (Neurological): Unimpaired          Sensory (Neurological): Unimpaired          Palpation: No palpable anomalies              Palpation: No palpable anomalies              Provocative Test(s):  Phalen's test: deferred Tinel's test: deferred Apley's scratch test (touch opposite shoulder):  Action 1 (Across chest): deferred Action 2 (Overhead): deferred Action 3 (LB reach): deferred   Provocative Test(s):  Phalen's test: (+) for CTS Tinel's test: deferred Apley's scratch test (touch opposite shoulder):  Action 1 (Across chest): deferred Action 2 (Overhead): deferred Action 3 (LB reach): deferred    Thoracic Spine Area Exam  Skin & Axial Inspection: No masses, redness, or swelling Alignment: Symmetrical Functional ROM: Unrestricted ROM Stability: No instability detected Muscle Tone/Strength: Functionally intact. No obvious neuro-muscular anomalies detected. Sensory (Neurological): Unimpaired Muscle strength & Tone: No palpable anomalies  Lumbar Spine  Area Exam  Skin & Axial Inspection: No masses, redness, or swelling Alignment: Symmetrical Functional ROM: Unrestricted ROM       Stability: No instability detected Muscle Tone/Strength: Functionally intact. No obvious neuro-muscular anomalies detected. Sensory (Neurological): Unimpaired Palpation: No palpable anomalies         Gait & Posture Assessment  Ambulation: Unassisted Gait: Relatively normal for age and body habitus Posture: WNL   Lower Extremity Exam    Side: Right lower extremity  Side: Left lower extremity  Stability: No instability observed          Stability: No instability observed          Skin & Extremity Inspection: Skin color, temperature, and hair growth are WNL. No peripheral edema or cyanosis. No masses, redness, swelling, asymmetry, or associated skin lesions. No contractures.  Skin & Extremity Inspection: Skin color, temperature, and hair growth are WNL. No peripheral edema or cyanosis. No masses, redness, swelling, asymmetry, or associated skin lesions. No contractures.  Functional ROM: Unrestricted ROM                  Functional ROM: Unrestricted ROM                  Muscle Tone/Strength: Functionally intact. No obvious neuro-muscular anomalies detected.  Muscle Tone/Strength: Functionally intact. No obvious neuro-muscular anomalies detected.  Sensory (Neurological): Unimpaired        Sensory (Neurological): Unimpaired            Palpation: No palpable anomalies  Palpation: No palpable anomalies   Assessment  Primary Diagnosis & Pertinent Problem List: The primary encounter diagnosis was Spondylosis without myelopathy or radiculopathy, lumbar region. Diagnoses of Degeneration of lumbar or lumbosacral intervertebral disc (L4-L5), Chronic sacroiliac joint pain (Bilateral) (R>L), Chronic pain syndrome, and Long term (current) use of opiate analgesic were also pertinent to this visit.  Status Diagnosis  Controlled Controlled Controlled 1. Spondylosis without  myelopathy or radiculopathy, lumbar region   2. Degeneration of lumbar or lumbosacral intervertebral disc (L4-L5)   3. Chronic sacroiliac joint pain (Bilateral) (R>L)   4. Chronic pain syndrome   5. Long term (current) use of opiate analgesic     Problems updated and reviewed during this visit: No problems updated. Plan of Care  Pharmacotherapy (Medications Ordered): Meds ordered this encounter  Medications  . Oxycodone HCl 10 MG TABS    Sig: Take 1 tablet (10 mg total) by mouth every 6 (six) hours as needed for up to 30 days.    Dispense:  120 tablet    Refill:  0    Do not place this medication, or any other prescription from our practice, on "Automatic Refill". Patient may have prescription filled one day early if pharmacy is closed on scheduled refill date.    Order Specific Question:   Supervising Provider    Answer:   Milinda Pointer [phone]  . Oxycodone HCl 10 MG TABS    Sig: Take 1 tablet (10 mg total) by mouth every 6 (six) hours as needed for up to 30 days.    Dispense:  120 tablet    Refill:  0    Do not place this medication, or any other prescription from our practice, on "Automatic Refill". Patient may have prescription filled one day early if pharmacy is closed on scheduled refill date.    Order Specific Question:   Supervising Provider    Answer:   Milinda Pointer [phone]  . Oxycodone HCl 10 MG TABS    Sig: Take 1 tablet (10 mg total) by mouth every 6 (six) hours as needed for up to 30 days.    Dispense:  120 tablet    Refill:  0    Do not place this medication, or any other prescription from our practice, on "Automatic Refill". Patient may have prescription filled one day early if pharmacy is closed on scheduled refill date.    Order Specific Question:   Supervising Provider    Answer:   Milinda Pointer [  [id number]]   New Prescriptions   No medications on file   Medications administered today: Abdalla L. Judice Jr. had no medications administered  during this visit. Lab-work, procedure(s), and/or referral(s): No orders of the defined types were placed in this encounter.  Imaging and/or referral(s): None  Interventional management options: Planned, scheduled, and/or pending:   None at this time. Use Brace on left wrist at night. Weight loss. Possible Cervical Xray    Considering:   Diagnostic bilateral lumbar facet block Possible bilateral lumbar facet RFA Diagnostic bilateral sacroiliac joint block Possible bilateral sacroiliac joint RFA Diagnostic caudal epidural steroid injection + diagnostic epidurogram Possible Racz procedure Diagnostic left intra-articular knee injection with local anesthetic and steroid Possible left series of 5 intra-articular Hyalgan knee injections Diagnostic left Genicular nerve block Possible left Genicular nerve RFA   Palliative PRN treatment(s):   Palliative right-sided lumbar facet + sacroiliac joint RFA #2(last one done on 10/11/2017) Palliative left-sided lumbar facet RFA #2(last one done on 11/24/2017)  Diagnostic right L4 TFES2 #1under fluoro and IV sedation.    Provider-requested follow-up: Return in about 3 months (around 11/02/2018) for MedMgmt.  Future Appointments  Date Time Provider [place]  10/26/2018 11:15 AM Vevelyn Francois, NP [facility] None   Primary Care Physician: Vidal Schwalbe, MD Location: [facility] Facility Note by: Vevelyn Francois NP Date: 08/03/2018; Time: 12:22 PM  Pain Score Disclaimer: We use the NRS-11 scale. This is a self-reported, subjective measurement of pain severity with only modest accuracy. It is used primarily to identify changes within a particular patient. It must be understood that outpatient pain scales are significantly less accurate that those used for research, where they can be applied under ideal controlled circumstances with minimal exposure to variables. In reality, the score is likely to be a  combination of pain intensity and pain affect, where pain affect describes the degree of emotional arousal or changes in action readiness caused by the sensory experience of pain. Factors such as social and work situation, setting, emotional state, anxiety levels, expectation, and prior pain experience may influence pain perception and show large inter-individual differences that may also be affected by time variables.  Patient instructions provided during this appointment: Patient Instructions   ____________________________________________________________________________________________  Medication Rules  Purpose: To inform patients, and their family members, of our rules and regulations.  Applies to: All patients receiving prescriptions (written or electronic).  Pharmacy of record: Pharmacy where electronic prescriptions will be sent. If written prescriptions are taken to a different pharmacy, please inform the nursing staff. The pharmacy listed in the electronic medical record should be the one where you would like electronic prescriptions to be sent.  Electronic prescriptions: In compliance with the [place] (STOP) Act of 2017 (Session Lanny Cramp [phone]), effective July 19, 2018, all controlled substances must be electronically prescribed. Calling prescriptions to the pharmacy will cease to exist.  Prescription refills: Only during scheduled appointments. Applies to all prescriptions.  NOTE: The following applies primarily to controlled substances (Opioid* Pain Medications).   Patient's responsibilities: 1. Pain Pills: Bring all pain pills to every appointment (except for procedure appointments). 2. Pill Bottles: Bring pills in original pharmacy bottle. Always bring the newest bottle. Bring bottle, even if empty. 3. Medication refills: You are responsible for knowing and keeping track of what medications you take and those you need refilled. The  day before your appointment: write a list of all prescriptions that need to be refilled. The day of  the appointment: give the list to the admitting nurse. Prescriptions will be written only during appointments. If you forget a medication: it will not be "Called in", "Faxed", or "electronically sent". You will need to get another appointment to get these prescribed. No early refills. Do not call asking to have your prescription filled early. 4. Prescription Accuracy: You are responsible for carefully inspecting your prescriptions before leaving our office. Have the discharge nurse carefully go over each prescription with you, before taking them home. Make sure that your name is accurately spelled, that your address is correct. Check the name and dose of your medication to make sure it is accurate. Check the number of pills, and the written instructions to make sure they are clear and accurate. Make sure that you are given enough medication to last until your next medication refill appointment. 5. Taking Medication: Take medication as prescribed. When it comes to controlled substances, taking less pills or less frequently than prescribed is permitted and encouraged. Never take more pills than instructed. Never take medication more frequently than prescribed.  6. Inform other Doctors: Always inform, all of your healthcare providers, of all the medications you take. 7. Pain Medication from other Providers: You are not allowed to accept any additional pain medication from any other Doctor or Healthcare provider. There are two exceptions to this rule. (see below) In the event that you require additional pain medication, you are responsible for notifying us, as stated below. 8. Medication Agreement: You are responsible for carefully reading and following our Medication Agreement. This must be signed before receiving any prescriptions from our practice. Safely store a copy of your signed Agreement. Violations to  the Agreement will result in no further prescriptions. (Additional copies of our Medication Agreement are available upon request.) 9. Laws, Rules, & Regulations: All patients are expected to follow all Federal and [employer], [employer], Rules, [employer]. Ignorance of the Laws does not constitute a valid excuse. The use of any illegal substances is prohibited. 10. Adopted CDC guidelines & recommendations: Target dosing levels will be at or below 60 MME/day. Use of benzodiazepines** is not recommended.  Exceptions: There are only two exceptions to the rule of not receiving pain medications from other Healthcare Providers. 1. Exception #1 (Emergencies): In the event of an emergency (i.e.: accident requiring emergency care), you are allowed to receive additional pain medication. However, you are responsible for: As soon as you are able, call our office [phone], at any time of the day or night, and leave a message stating your name, the date and nature of the emergency, and the name and dose of the medication prescribed. In the event that your call is answered by a member of our staff, make sure to document and save the date, time, and the name of the person that took your information.  2. Exception #2 (Planned Surgery): In the event that you are scheduled by another doctor or dentist to have any type of surgery or procedure, you are allowed (for a period no longer than 30 days), to receive additional pain medication, for the acute post-op pain. However, in this case, you are responsible for picking up a copy of our "Post-op Pain Management for Surgeons" handout, and giving it to your surgeon or dentist. This document is available at our office, and does not require an appointment to obtain it. Simply go to our office during business hours (Monday-Thursday from 8:00 AM to 4:00 PM) (Friday 8:00 AM to 12:00 Noon) or  if you have a scheduled appointment with [place], prior to your surgery, and ask for it by name.  In addition, you will need to provide [place] with your name, name of your surgeon, type of surgery, and date of procedure or surgery.  *Opioid medications include: morphine, codeine, oxycodone, oxymorphone, hydrocodone, hydromorphone, meperidine, tramadol, tapentadol, buprenorphine, fentanyl, methadone. **Benzodiazepine medications include: diazepam (Valium), alprazolam (Xanax), clonazepam (Klonopine), lorazepam (Ativan), clorazepate (Tranxene), chlordiazepoxide (Librium), estazolam (Prosom), oxazepam (Serax), temazepam (Restoril), triazolam (Halcion) (Last updated: 09/15/2017) ____________________________________________________________________________________________   BMI Assessment: Estimated body mass index is 37.66 kg/m as calculated from the following:   Height as of this encounter: _0  (1.803 m).   Weight as of this encounter: 270 lb (122.5 kg).  BMI interpretation table: BMI level Category Range association with higher incidence of chronic pain  <18 kg/m2 Underweight   18.5-24.9 kg/m2 Ideal body weight   25-29.9 kg/m2 Overweight Increased incidence by 20%  30-34.9 kg/m2 Obese (Class I) Increased incidence by 68%  35-39.9 kg/m2 Severe obesity (Class II) Increased incidence by 136%  >40 kg/m2 Extreme obesity (Class III) Increased incidence by 254%   Patient's current BMI Ideal Body weight  Body mass index is 37.66 kg/m. Ideal body weight: 75.3 kg (166 lb 0.1 oz) Adjusted ideal body weight: 94.2 kg (207 lb 9.7 oz)   BMI Readings from Last 4 Encounters:  08/03/18 37.66 kg/m  06/21/18 37.66 kg/m  05/30/18 37.66 kg/m  05/17/18 38.35 kg/m   Wt Readings from Last 4 Encounters:  08/03/18 270 lb (122.5 kg)  06/21/18 270 lb (122.5 kg)  05/30/18 270 lb (122.5 kg)  05/17/18 275 lb (124.7 kg)

## 2018-08-03 NOTE — Patient Instructions (Addendum)
____________________________________________________________________________________________  Medication Rules  Purpose: To inform patients, and their family members, of our rules and regulations.  Applies to: All patients receiving prescriptions (written or electronic).  Pharmacy of record: Pharmacy where electronic prescriptions will be sent. If written prescriptions are taken to a different pharmacy, please inform the nursing staff. The pharmacy listed in the electronic medical record should be the one where you would like electronic prescriptions to be sent.  Electronic prescriptions: In compliance with the [place] Strengthen Opioid Misuse Prevention (STOP) Act of 2017 (Session Law 2017-74/H243), effective July 19, 2018, all controlled substances must be electronically prescribed. Calling prescriptions to the pharmacy will cease to exist.  Prescription refills: Only during scheduled appointments. Applies to all prescriptions.  NOTE: The following applies primarily to controlled substances (Opioid* Pain Medications).   Patient's responsibilities: 1. Pain Pills: Bring all pain pills to every appointment (except for procedure appointments). 2. Pill Bottles: Bring pills in original pharmacy bottle. Always bring the newest bottle. Bring bottle, even if empty. 3. Medication refills: You are responsible for knowing and keeping track of what medications you take and those you need refilled. The day before your appointment: write a list of all prescriptions that need to be refilled. The day of the appointment: give the list to the admitting nurse. Prescriptions will be written only during appointments. If you forget a medication: it will not be "Called in", "Faxed", or "electronically sent". You will need to get another appointment to get these prescribed. No early refills. Do not call asking to have your prescription filled early. 4. Prescription Accuracy: You are responsible for  carefully inspecting your prescriptions before leaving our office. Have the discharge nurse carefully go over each prescription with you, before taking them home. Make sure that your name is accurately spelled, that your address is correct. Check the name and dose of your medication to make sure it is accurate. Check the number of pills, and the written instructions to make sure they are clear and accurate. Make sure that you are given enough medication to last until your next medication refill appointment. 5. Taking Medication: Take medication as prescribed. When it comes to controlled substances, taking less pills or less frequently than prescribed is permitted and encouraged. Never take more pills than instructed. Never take medication more frequently than prescribed.  6. Inform other Doctors: Always inform, all of your healthcare providers, of all the medications you take. 7. Pain Medication from other Providers: You are not allowed to accept any additional pain medication from any other Doctor or Healthcare provider. There are two exceptions to this rule. (see below) In the event that you require additional pain medication, you are responsible for notifying us, as stated below. 8. Medication Agreement: You are responsible for carefully reading and following our Medication Agreement. This must be signed before receiving any prescriptions from our practice. Safely store a copy of your signed Agreement. Violations to the Agreement will result in no further prescriptions. (Additional copies of our Medication Agreement are available upon request.) 9. Laws, Rules, & Regulations: All patients are expected to follow all Federal and State Laws, Statutes, Rules, & Regulations. Ignorance of the Laws does not constitute a valid excuse. The use of any illegal substances is prohibited. 10. Adopted CDC guidelines & recommendations: Target dosing levels will be at or below 60 MME/day. Use of benzodiazepines** is not  recommended.  Exceptions: There are only two exceptions to the rule of not receiving pain medications from other Healthcare Providers. 1.   Exception #1 (Emergencies): In the event of an emergency (i.e.: accident requiring emergency care), you are allowed to receive additional pain medication. However, you are responsible for: As soon as you are able, call our office [phone], at any time of the day or night, and leave a message stating your name, the date and nature of the emergency, and the name and dose of the medication prescribed. In the event that your call is answered by a member of our staff, make sure to document and save the date, time, and the name of the person that took your information.  2. Exception #2 (Planned Surgery): In the event that you are scheduled by another doctor or dentist to have any type of surgery or procedure, you are allowed (for a period no longer than 30 days), to receive additional pain medication, for the acute post-op pain. However, in this case, you are responsible for picking up a copy of our "Post-op Pain Management for Surgeons" handout, and giving it to your surgeon or dentist. This document is available at our office, and does not require an appointment to obtain it. Simply go to our office during business hours (Monday-Thursday from 8:00 AM to 4:00 PM) (Friday 8:00 AM to 12:00 Noon) or if you have a scheduled appointment with [place], prior to your surgery, and ask for it by name. In addition, you will need to provide [place] with your name, name of your surgeon, type of surgery, and date of procedure or surgery.  *Opioid medications include: morphine, codeine, oxycodone, oxymorphone, hydrocodone, hydromorphone, meperidine, tramadol, tapentadol, buprenorphine, fentanyl, methadone. **Benzodiazepine medications include: diazepam (Valium), alprazolam (Xanax), clonazepam (Klonopine), lorazepam (Ativan), clorazepate (Tranxene), chlordiazepoxide (Librium), estazolam (Prosom),  oxazepam (Serax), temazepam (Restoril), triazolam (Halcion) (Last updated: 09/15/2017) ____________________________________________________________________________________________   BMI Assessment: Estimated body mass index is 37.66 kg/m as calculated from the following:   Height as of this encounter: 5\' 11"  (1.803 m).   Weight as of this encounter: 270 lb (122.5 kg).  BMI interpretation table: BMI level Category Range association with higher incidence of chronic pain  <18 kg/m2 Underweight   18.5-24.9 kg/m2 Ideal body weight   25-29.9 kg/m2 Overweight Increased incidence by 20%  30-34.9 kg/m2 Obese (Class I) Increased incidence by 68%  35-39.9 kg/m2 Severe obesity (Class II) Increased incidence by 136%  >40 kg/m2 Extreme obesity (Class III) Increased incidence by 254%   Patient's current BMI Ideal Body weight  Body mass index is 37.66 kg/m. Ideal body weight: 75.3 kg (166 lb 0.1 oz) Adjusted ideal body weight: 94.2 kg (207 lb 9.7 oz)   BMI Readings from Last 4 Encounters:  08/03/18 37.66 kg/m  06/21/18 37.66 kg/m  05/30/18 37.66 kg/m  05/17/18 38.35 kg/m   Wt Readings from Last 4 Encounters:  08/03/18 270 lb (122.5 kg)  06/21/18 270 lb (122.5 kg)  05/30/18 270 lb (122.5 kg)  05/17/18 275 lb (124.7 kg)

## 2018-10-26 ENCOUNTER — Ambulatory Visit: Payer: BLUE CROSS/BLUE SHIELD | Attending: Nurse Practitioner | Admitting: Nurse Practitioner

## 2018-10-26 ENCOUNTER — Other Ambulatory Visit: Payer: Self-pay

## 2018-10-26 ENCOUNTER — Telehealth: Payer: Self-pay | Admitting: *Deleted

## 2018-10-26 DIAGNOSIS — Z79891 Long term (current) use of opiate analgesic: Secondary | ICD-10-CM | POA: Diagnosis not present

## 2018-10-26 DIAGNOSIS — M5136 Other intervertebral disc degeneration, lumbar region: Secondary | ICD-10-CM

## 2018-10-26 DIAGNOSIS — E559 Vitamin D deficiency, unspecified: Secondary | ICD-10-CM | POA: Diagnosis not present

## 2018-10-26 DIAGNOSIS — G8929 Other chronic pain: Secondary | ICD-10-CM

## 2018-10-26 DIAGNOSIS — M48061 Spinal stenosis, lumbar region without neurogenic claudication: Secondary | ICD-10-CM

## 2018-10-26 DIAGNOSIS — M533 Sacrococcygeal disorders, not elsewhere classified: Secondary | ICD-10-CM

## 2018-10-26 DIAGNOSIS — G894 Chronic pain syndrome: Secondary | ICD-10-CM | POA: Diagnosis not present

## 2018-10-26 DIAGNOSIS — M47816 Spondylosis without myelopathy or radiculopathy, lumbar region: Secondary | ICD-10-CM

## 2018-10-26 MED ORDER — OXYCODONE HCL 10 MG PO TABS: 10.0000 mg | ORAL_TABLET | Freq: Four times a day (QID) | ORAL | 0 refills | Status: DC | PRN

## 2018-10-26 MED ORDER — MAGNESIUM 500 MG PO CAPS: 500.0000 mg | ORAL_CAPSULE | Freq: Two times a day (BID) | ORAL | 5 refills | Status: DC

## 2018-10-26 MED ORDER — VITAMIN D3 125 MCG (5000 UT) PO CAPS: 1.0000 | ORAL_CAPSULE | Freq: Every day | ORAL | 5 refills | Status: DC

## 2018-10-26 MED ORDER — GNP CALCIUM 1200 1200-1000 MG-UNIT PO CHEW: 1200.0000 mg | CHEWABLE_TABLET | Freq: Every day | ORAL | 5 refills | Status: DC

## 2018-10-26 NOTE — Progress Notes (Signed)
Pain Management Encounter Note - Virtual Visit via Telephone Telehealth (real-time audio visits between healthcare provider and patient).  Patient's Phone No. & Preferred Pharmacy:  [phone] (home); [phone] (mobile); (Preferred) [phone]  [place], [place] - [address] [place] [address] Phone: [phone] Fax: [phone]   Pre-screening note:  Our staff contacted Nathan Lambert and offered him an "in person", "face-to-face" appointment versus a telephone encounter. He indicated preferring the telephone encounter, at this time.  Reason for Virtual Visit: COVID-19*  Social distancing based on CDC and AMA recommendations.   I contacted Nathan Lambert. on 10/26/2018 at 11:15 AM by telephone and clearly identified myself as Nathan David, NP. I verified that I was speaking with the correct person using two identifiers (Name and date of birth: 12/17/1980).  Advanced Informed Consent I sought verbal advanced consent from Nathan Lambert. for telemedicine interactions and virtual visit. I informed Nathan Lambert of the security and privacy concerns, risks, and limitations associated with performing an evaluation and management service by telephone. I also informed Nathan Lambert of the availability of "in person" appointments and I informed him of the possibility of a patient responsible charge related to this service. Nathan Lambert expressed understanding and agreed to proceed.   Historic Elements   Nathan Lambert. is a 38 y.o. year old, male patient evaluated today after his last encounter by our practice on 08/03/2018. 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 He also  has a past surgical history  that includes Fracture surgery (Left); Spine surgery (Right, 06/16/2015); and Back surgery. Nathan Lambert has a current medication list which includes the following prescription(s): gnp calcium 1200, vitamin d3, loratadine, magnesium, montelukast, naloxone, oxycodone hcl, oxycodone hcl, oxycodone hcl, testosterone cypionate, and viagra. He  reports that he has been smoking cigarettes. He has a 10.00 pack-year smoking history. He has never used smokeless tobacco. He reports that he does not drink alcohol or use drugs. Nathan Lambert is allergic to bactrim [sulfamethoxazole-trimethoprim].   HPI  I last saw him on 08/03/2018. He is being evaluated for medication management. He rates his lower back pain 3/10. He admits that the has right leg numbness that night with difference. He admits that his pain is getting worse because he is trying to be more active. He would like to have the RFA repeated. He did have PT and he admits that the pain was only made worse. He admits that water therapy was beneficial but it was no longer covered.   Pharmacotherapy Assessment  Analgesic:oxycodone 10 mg 4 times daily MME/day:49m/day.  Monitoring: Pharmacotherapy: No side-effects or adverse reactions reported. [place] PMP: PDMP reviewed during this encounter.       Compliance: No problems identified. Plan: Refer to "POC".  Review of recent tests  DG C-Arm 1-60 Min-No Report Fluoroscopy was utilized by the requesting physician.  No radiographic  interpretation.    Hospital Outpatient Visit on 05/17/2018  Component Date Value Ref Range Status  . CRP 05/17/2018 1.3* <1.0 mg/dL Final   [facility], [place] [id number] . Vit D, 25-Hydroxy 05/17/2018 18.6* 30.0 - 100.0 ng/mL Final   Comment: (NOTE) Vitamin D deficiency has been defined by the Institute of Medicine and an Endocrine Society practice  guideline as a level of serum 25-OH vitamin D less than 20 ng/mL (1,2). The  Endocrine Society went on to further define vitamin D insufficiency as a level between 21 and 29 ng/mL (2). 1. IOM (Institute of Medicine). 2010. Dietary reference   intakes for calcium and D. [place]: The   [employer]. 2. Holick MF, Binkley [place], Bischoff-Ferrari HA, et al.   Evaluation, treatment, and prevention of vitamin D   deficiency: an Endocrine Society clinical practice   guideline. JCEM. 2011 Jul; 96(7):1911-30. Performed At: [facility] [place], [place] [id number] Rush Farmer MD NW:[id number]   . Sed Rate 05/17/2018 10  0 - 15 mm/hr Final   Performed at [facility], [address] [place], [place] [address]  . Vitamin B-12 05/17/2018 222  180 - 914 pg/mL Final   Comment: (NOTE) This assay is not validated for testing neonatal or myeloproliferative syndrome specimens for Vitamin B12 levels. Performed at [facility], [place] [address], [place], [place] [address]   . Magnesium 05/17/2018 2.0  1.7 - 2.4 mg/dL Final   Performed at [facility], [address] [place], [place] [address]  . Sodium 05/17/2018 139  135 - 145 mmol/L Final  . Potassium 05/17/2018 4.3  3.5 - 5.1 mmol/L Final  . Chloride 05/17/2018 107  98 - 111 mmol/L Final  . CO2 05/17/2018 26  22 - 32 mmol/L Final  . Glucose, Bld 05/17/2018 114* 70 - 99 mg/dL Final  . BUN 05/17/2018 13  6 - 20 mg/dL Final  . Creatinine, Ser 05/17/2018 0.85  0.61 - 1.24 mg/dL Final  . Calcium 05/17/2018 8.9  8.9 - 10.3 mg/dL Final  . Total Protein 05/17/2018 7.9  6.5 - 8.1 g/dL Final  . Albumin 05/17/2018 4.1  3.5 - 5.0 g/dL Final  . AST 05/17/2018 13* 15 - 41 U/L Final  . ALT 05/17/2018 15  0 - 44 U/L Final  . Alkaline Phosphatase 05/17/2018 47  38 - 126 U/L Final  . Total Bilirubin 05/17/2018 0.5  0.3 - 1.2 mg/dL Final  . GFR calc non Af Amer 05/17/2018 >60  >60 mL/min Final  . GFR calc Af Amer 05/17/2018 >60  >60 mL/min Final   Comment: (NOTE) The eGFR  has been calculated using the CKD EPI equation. This calculation has not been validated in all clinical situations. eGFR's persistently <60 mL/min signify possible Chronic Kidney Disease.   Georgiann Hahn gap 05/17/2018 6  5 - 15 Final   Performed at [facility], [address] [place], [place] [address]   Assessment  The primary encounter diagnosis was Spondylosis without myelopathy or radiculopathy, lumbar region. Diagnoses of Chronic pain syndrome, Long term (current) use of opiate analgesic, Vitamin D deficiency, Chronic sacroiliac joint pain (Bilateral) (R>L), and DDD (degenerative disc disease), lumbar were also pertinent to this visit.  Plan of Care  I have discontinued Nathan L. Mcghee Jr.'s gabapentin. I am also having him maintain his loratadine, montelukast, naloxone, testosterone cypionate, Viagra, Oxycodone HCl, Oxycodone HCl, Oxycodone HCl, GNP Calcium 1200, Vitamin D3, and Magnesium.  Pharmacotherapy (Medications Ordered): Meds ordered this encounter  Medications  . Oxycodone HCl 10 MG TABS    Sig: Take 1 tablet (10 mg total) by mouth every 6 (six) hours as needed for up to 30 days.    Dispense:  120 tablet    Refill:  0    Do not place this medication, or any other prescription from  our practice, on "Automatic Refill". Patient may have prescription filled one day early if pharmacy is closed on scheduled refill date.    Order Specific Question:   Supervising Provider    Answer:   Milinda Pointer [phone]  . Oxycodone HCl 10 MG TABS    Sig: Take 1 tablet (10 mg total) by mouth every 6 (six) hours as needed for up to 30 days.    Dispense:  120 tablet    Refill:  0    Do not place this medication, or any other prescription from our practice, on "Automatic Refill". Patient may have prescription filled one day early if pharmacy is closed on scheduled refill date.    Order Specific Question:   Supervising Provider    Answer:   Milinda Pointer [phone]  .  Oxycodone HCl 10 MG TABS    Sig: Take 1 tablet (10 mg total) by mouth every 6 (six) hours as needed for up to 30 days.    Dispense:  120 tablet    Refill:  0    Do not place this medication, or any other prescription from our practice, on "Automatic Refill". Patient may have prescription filled one day early if pharmacy is closed on scheduled refill date.    Order Specific Question:   Supervising Provider    Answer:   Milinda Pointer [phone]  . Calcium Carbonate-Vit D-Min (GNP CALCIUM 1200) 1200-1000 MG-UNIT CHEW    Sig: Chew 1,200 mg by mouth daily with breakfast. Take in combination with vitamin D and magnesium.    Dispense:  30 tablet    Refill:  5    Do not place medication on "Automatic Refill".  May substitute with similar over-the-counter product.    Order Specific Question:   Supervising Provider    Answer:   Milinda Pointer [phone]  . Cholecalciferol (VITAMIN D3) 125 MCG (5000 UT) CAPS    Sig: Take 1 capsule (5,000 Units total) by mouth daily with breakfast. Take along with calcium and magnesium.    Dispense:  30 capsule    Refill:  5    Do not place medication on "Automatic Refill".  May substitute with similar over-the-counter product.    Order Specific Question:   Supervising Provider    Answer:   Milinda Pointer [phone]  . Magnesium 500 MG CAPS    Sig: Take 1 capsule (500 mg total) by mouth 2 (two) times daily at 8 am and 10 pm.    Dispense:  60 capsule    Refill:  5    Do not place medication on "Automatic Refill".  The patient may use similar over-the-counter product.    Order Specific Question:   Supervising Provider    Answer:   Milinda Pointer [phone]   Orders:  No orders of the defined types were placed in this encounter.  Follow-up plan:   Return in about 3 months (around 01/25/2019) for MedMgmt, Procedure(w/Sedation), (R) L Fct RFA. Order released   I discussed the assessment and treatment plan with the patient. The patient was provided an  opportunity to ask questions and all were answered. The patient agreed with the plan and demonstrated an understanding of the instructions.  Patient advised to call back or seek an in-person evaluation if the symptoms or condition worsens.  Total duration of non-face-to-face encounter: 11 minutes.  Note by: Nathan David, NP  Disclaimer:  * Given the special circumstances of the COVID-19 pandemic, the federal government has announced that the Office for Civil Rights (  OCR) will exercise its enforcement discretion and will not impose penalties on physicians using telehealth in the event of noncompliance with regulatory requirements under the [place] and Accountability Act (HIPAA) in connection with the good faith provision of telehealth during the NATFT-73 national public health emergency. (AMA)

## 2019-01-18 ENCOUNTER — Encounter: Payer: Self-pay | Admitting: Pain Medicine

## 2019-01-21 NOTE — Progress Notes (Signed)
Pain Management Virtual Encounter Note - Virtual Visit via Telephone Telehealth (real-time audio visits between healthcare provider and patient).   Patient's Phone No. & Preferred Pharmacy:  [phone] (home); [phone] (mobile); (Preferred) [phone] [email or web address]  [facility] [place] [place], [place] - [address] [place] [address] Phone: [phone] Fax: [phone]    Pre-screening note:  Our staff contacted Nathan Lambert and offered him an "in person", "face-to-face" appointment versus a telephone encounter. He indicated preferring the telephone encounter, at this time.   Reason for Virtual Visit: COVID-19*  Social distancing based on CDC and AMA recommendations.   I contacted Nathan Flockarlton L Zaring Jr. on 01/22/2019 via telephone.      I clearly identified myself as Oswaldo DoneFrancisco A Amylia Collazos, MD. I verified that I was speaking with the correct person using two identifiers (Name: Nathan FlockCarlton L Jalbert Jr., and date of birth: 01-11-1981).  Advanced Informed Consent I sought verbal advanced consent from Nathan Flockarlton L Bellis Jr. for virtual visit interactions. I informed Nathan Lambert of possible security and privacy concerns, risks, and limitations associated with providing "not-in-person" medical evaluation and management services. I also informed Nathan Lambert of the availability of "in-person" appointments. Finally, I informed him that there would be a charge for the virtual visit and that he could be  personally, fully or partially, financially responsible for it. Nathan Lambert expressed understanding and agreed to proceed.   Historic Elements   Mr. Nathan FlockCarlton L Caris Jr. is a 38 y.o. year old, male patient evaluated today after his last encounter by our practice on 10/26/2018. 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 He also  has a past surgical history that includes Fracture surgery (Left); Spine surgery (Right, 06/16/2015); and Back surgery. Nathan Lambert has a current medication list which includes the following prescription(s): gnp calcium 1200, vitamin d3, loratadine, magnesium, montelukast, naloxone, oxycodone hcl, oxycodone hcl, oxycodone hcl, testosterone cypionate, and viagra. He  reports that he has been smoking cigarettes. He has a 10.00 pack-year smoking history. He has never used smokeless tobacco. He reports that he does not drink alcohol or use drugs. Nathan Lambert is allergic to bactrim [sulfamethoxazole-trimethoprim].   HPI  Today, he is being contacted for medication management.  The patient indicates doing well on his current medication regimen.  He denies any side effects or adverse reaction.  However, he has been experiencing more pain in the lower back.  He refers that the pain is in the small of his back and more towards the left side, but it is still bilateral.  On Nov 24, 2017 he underwent a left-sided lumbar facet radiofrequency ablation under fluoroscopic guidance and IV sedation.  Prior to that, on 10/11/2017 he had a left-sided lumbar facet + sacroiliac joint radiofrequency ablation.  He did rather well until recently when he started having more pain.  I will be bringing him in for a diagnostic bilateral lumbar facet block under fluoroscopic guidance  In addition to this, he has been complaining of left upper extremity numbness affecting his thumb, index finger, and middle finger, and what appears to be a C6/C7 dermatomal distribution.  2 prior x-rays of the cervical spine have been negative and therefore I believe that it is time for us to order an MRI to see what is going on since he keeps having this radicular  symptoms.  Pharmacotherapy Assessment  Analgesic: Oxycodone IR 10 mg 4 times  daily MME/day: /day.   Monitoring: Pharmacotherapy: No side-effects or adverse reactions reported. [place] PMP: PDMP reviewed during this encounter.       Compliance: No problems identified. Effectiveness: Clinically acceptable. Plan: Refer to "POC".  Pertinent Labs   SAFETY SCREENING Profile No results found for: SARSCOV2NAA, COVIDSOURCE, STAPHAUREUS, MRSAPCR, HCVAB, HIV, PREGTESTUR Renal Function Lab Results  Component Value Date   BUN 13 05/17/2018   CREATININE 0.85 05/17/2018   BCR 7 (L) 12/27/2016   GFRAA >60 05/17/2018   GFRNONAA >60 05/17/2018   Hepatic Function Lab Results  Component Value Date   AST 13 (L) 05/17/2018   ALT 15 05/17/2018   ALBUMIN 4.1 05/17/2018   UDS Summary  Date Value Ref Range Status  05/04/2018 FINAL  Final    Comment:    ==================================================================== TOXASSURE SELECT 13 (MW) ==================================================================== Test                             Result       Flag       Units Drug Present and Declared for Prescription Verification   Oxycodone                      594          EXPECTED   ng/mg creat   Oxymorphone                    523          EXPECTED   ng/mg creat   Noroxycodone                   726          EXPECTED   ng/mg creat   Noroxymorphone                 141          EXPECTED   ng/mg creat    Sources of oxycodone are scheduled prescription medications.    Oxymorphone, noroxycodone, and noroxymorphone are expected    metabolites of oxycodone. Oxymorphone is also available as a    scheduled prescription medication. ==================================================================== Test                      Result    Flag   Units      Ref Range   Creatinine              135              mg/dL      >=16 ==================================================================== Declared Medications:  The flagging and interpretation on this report are based on  the  following declared medications.  Unexpected results may arise from  inaccuracies in the declared medications.  **Note: The testing scope of this panel includes these medications:  Oxycodone  **Note: The testing scope of this panel does not include following  reported medications:  Gabapentin  Loratadine  Montelukast (Singulair)  Naloxone (Narcan)  Sildenafil (Viagra)  Testosterone ==================================================================== For clinical consultation, please call [phone]. ====================================================================    Note: Above Lab results reviewed.  Recent imaging  DG C-Arm 1-60 Min-No Report Fluoroscopy was utilized by the requesting physician.  No radiographic  interpretation.   Assessment  The primary encounter diagnosis was Chronic pain syndrome. 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  Plan of Care  I have discontinued Kyrie L. Swartzendruber Jr.'s Oxycodone HCl and Oxycodone HCl. I have also changed his Oxycodone HCl. Additionally, I am having him start on Oxycodone HCl and Oxycodone HCl. Lastly, I am having him maintain his loratadine, montelukast, testosterone cypionate, Viagra, naloxone, GNP Calcium 1200, Vitamin D3, and Magnesium.  Pharmacotherapy (Medications Ordered): Meds ordered this encounter  Medications  . naloxone (NARCAN) 2 MG/2ML injection     Sig: Inject content of syringe into thigh muscle. Call 911.    Dispense:  2 mL    Refill:  1    NDC # [id number]. Please teach proper use of device.  . Calcium Carbonate-Vit D-Min (GNP CALCIUM 1200) 1200-1000 MG-UNIT CHEW    Sig: Chew 1,200 mg by mouth daily with breakfast. Take in combination with vitamin D and magnesium.    Dispense:  90 tablet    Refill:  0    Fill one day early if pharmacy is closed on scheduled refill date. May substitute for generic if available.  . Cholecalciferol (VITAMIN D3) 125 MCG (5000 UT) CAPS    Sig: Take 1 capsule (5,000 Units total) by mouth daily with breakfast. Take along with calcium and magnesium.    Dispense:  90 capsule    Refill:  0    Fill one day early if pharmacy is closed on scheduled refill date. May substitute for generic if available.  . Magnesium 500 MG CAPS    Sig: Take 1 capsule (500 mg total) by mouth 2 (two) times daily at 8 am and 10 pm.    Dispense:  180 capsule    Refill:  0    Fill one day early if pharmacy is closed on scheduled refill date. May substitute for generic if available.  . Oxycodone HCl 10 MG TABS    Sig: Take 1 tablet (10 mg total) by mouth every 6 (six) hours as needed. Must last 30 days    Dispense:  120 tablet    Refill:  0    Chronic Pain: STOP Act (Not applicable) Fill 1 day early if closed on refill date. Do not fill until: 02/08/2019. To last until: 03/10/2019. Avoid benzodiazepines within 8 hours of opioids  . Oxycodone HCl 10 MG TABS    Sig: Take 1 tablet (10 mg total) by mouth every 6 (six) hours as needed. Must last 30 days    Dispense:  120 tablet    Refill:  0    Chronic Pain: STOP Act (Not applicable) Fill 1 day early if closed on refill date. Do not fill until: 03/10/2019. To last until: 04/09/2019. Avoid benzodiazepines within 8 hours of opioids  . Oxycodone HCl 10 MG TABS    Sig: Take 1 tablet (10 mg total) by mouth every 6 (six) hours as needed. Must last 30 days    Dispense:  120 tablet     Refill:  0    Chronic Pain: STOP Act (Not applicable) Fill 1 day early if closed on refill date. Do not fill until: 04/09/2019. To last until:  05/09/2019. Avoid benzodiazepines within 8 hours of opioids   Orders:  Orders Placed This Encounter  Procedures  . LUMBAR FACET(MEDIAL BRANCH NERVE BLOCK) MBNB    Standing Status:   Future    Standing Expiration Date:   02/22/2019    Scheduling Instructions:     Side: Bilateral     Level: L3-4, L4-5, & L5-S1 Facets (L2, L3, L4, L5, & S1 Medial Branch Nerves)     Sedation: Patient's choice.     Timeframe: ASAA    Order Specific Question:   Where will this procedure be performed?    Answer:   [facility]  . MR CERVICAL SPINE WO CONTRAST    In addition to any acute findings, please report on degenerative changes related to: (Please specify level(s)) (1) ROM & instability (>944mm displacement) (2) Facet joint (Zygoapophyseal Joint) (3) DDD and/or IVDD (4) Pars defects (5) Previous surgical changes (Include description of hardware and hardware status, if present) (6) Presence and degree of spondylolisthesis, spondylosis, and/or spondyloarthropathies)  (7) Old Fractures (8) Demineralization (9) Additional bone pathology (10) Stenosis (Central, Lateral Recess, Foraminal) (11) If at all possible, please provide AP diameter (mm) of foraminal and/or central canal.    Standing Status:   Future    Standing Expiration Date:   04/24/2019    Order Specific Question:   What is the patient's sedation requirement?    Answer:   No Sedation    Order Specific Question:   Does the patient have a pacemaker or implanted devices?    Answer:   No    Order Specific Question:   Preferred imaging location?    Answer:   [facility] (table limit-350lbs)    Order Specific Question:   Call Results- Best Contact Number?    Answer:   [phone] [facility])    Order Specific Question:   Radiology Contrast Protocol - do NOT remove file path     Answer:   [email or web address]    Order Specific Question:   ** REASON FOR EXAM (FREE TEXT)    Answer:   Neck Pain & Radiculitis  . ToxASSURE Select 13 (MW), Urine    Volume: 30 ml(s). Minimum 3 ml of urine is needed. Document temperature of fresh sample. Indications: Long term (current) use of opiate analgesic (Z79.891)  . Comp. Metabolic Panel (12)    With GFR. Indications: Chronic Pain Syndrome (G89.4) & Pharmacotherapy (A54.098(Z79.899)    Order Specific Question:   Has the patient fasted?    Answer:   No    Order Specific Question:   CC Results    Answer:   PCP-NURSE [[id number]]  . Magnesium    Indication: Pharmacologic therapy (J19.147(Z79.899)    Order Specific Question:   CC Results    Answer:   PCP-NURSE [[id number]][[id number]]  . Vitamin B12    Indication: Pharmacologic therapy (Z30.865(Z79.899).    Order Specific Question:   CC Results    Answer:   PCP-NURSE [[id number]]  . Sedimentation rate    Indication: Disorder of skeletal system (M89.9)    Order Specific Question:   CC Results    Answer:   PCP-NURSE [[id number]][[id number]]  . 25-Hydroxyvitamin D Lcms D2+D3    Indication: Disorder of skeletal system (M89.9).    Order Specific Question:   CC Results    Answer:   PCP-NURSE [[id number]]  . C-reactive protein    Indication: Problems influencing health status (Z78.9)    Order Specific Question:   CC Results    Answer:  PCP-NURSE [[id number]]   Follow-up plan:   Return in 4 months (on 05/09/2019) for (VV), E/M (MM), in addition, Procedure (w/ sedation): (B) L-FCT Blk.     Considering:   Diagnostic bilateral lumbar facet block Possible bilateral lumbar facet RFA Diagnostic bilateral sacroiliac joint block Possible bilateral sacroiliac joint RFA Diagnostic caudal epidural steroid injection + diagnostic epidurogram Possible Racz procedure Diagnostic left intra-articular knee injection with local anesthetic and steroid Possible left series of 5 intra-articular Hyalgan knee injections Diagnostic left  Genicular nerve block Possible left Genicular nerve RFA   Palliative PRN treatment(s):   Palliative right-sided lumbar facet + sacroiliac joint RFA #2(last one done on 10/11/2017) Palliative left-sided lumbar facet RFA #2(last one done on 11/24/2017)  Diagnostic right L4 TFES2 #1under fluoro and IV sedation.    Recent Visits Date Type Provider Dept  10/26/18 Office Visit Vevelyn Francois, NP [facility]  Showing recent visits within past 90 days and meeting all other requirements   Today's Visits Date Type Provider Dept  01/22/19 Office Visit Milinda Pointer, MD [facility]  Showing today's visits and meeting all other requirements   Future Appointments No visits were found meeting these conditions.  Showing future appointments within next 90 days and meeting all other requirements   I discussed the assessment and treatment plan with the patient. The patient was provided an opportunity to ask questions and all were answered. The patient agreed with the plan and demonstrated an understanding of the instructions.  Patient advised to call back or seek an in-person evaluation if the symptoms or condition worsens.  Total duration of non-face-to-face encounter: 20 minutes.  Note by: Gaspar Cola, MD Date: 01/22/2019; Time: 11:44 AM  Note: This dictation was prepared with Dragon dictation. Any transcriptional errors that may result from this process are unintentional.  Disclaimer:  * Given the special circumstances of the COVID-19 pandemic, the federal government has announced that the Office for Civil Rights (OCR) will exercise its enforcement discretion and will not impose penalties on physicians using telehealth in the event of noncompliance with regulatory requirements under the [place] (HIPAA) in connection with the good faith provision of telehealth during the TZGYF-74 national public health emergency. ([place])

## 2019-01-22 ENCOUNTER — Ambulatory Visit: Payer: Medicaid Other | Attending: Nurse Practitioner | Admitting: Pain Medicine

## 2019-01-22 ENCOUNTER — Other Ambulatory Visit: Payer: Self-pay

## 2019-01-22 DIAGNOSIS — M5412 Radiculopathy, cervical region: Secondary | ICD-10-CM

## 2019-01-22 DIAGNOSIS — F1190 Opioid use, unspecified, uncomplicated: Secondary | ICD-10-CM

## 2019-01-22 DIAGNOSIS — M25562 Pain in left knee: Secondary | ICD-10-CM | POA: Diagnosis not present

## 2019-01-22 DIAGNOSIS — M47816 Spondylosis without myelopathy or radiculopathy, lumbar region: Secondary | ICD-10-CM

## 2019-01-22 DIAGNOSIS — G8929 Other chronic pain: Secondary | ICD-10-CM

## 2019-01-22 DIAGNOSIS — M899 Disorder of bone, unspecified: Secondary | ICD-10-CM

## 2019-01-22 DIAGNOSIS — Z79899 Other long term (current) drug therapy: Secondary | ICD-10-CM

## 2019-01-22 DIAGNOSIS — M79602 Pain in left arm: Secondary | ICD-10-CM

## 2019-01-22 DIAGNOSIS — G894 Chronic pain syndrome: Secondary | ICD-10-CM | POA: Diagnosis not present

## 2019-01-22 DIAGNOSIS — M542 Cervicalgia: Secondary | ICD-10-CM

## 2019-01-22 DIAGNOSIS — M5030 Other cervical disc degeneration, unspecified cervical region: Secondary | ICD-10-CM

## 2019-01-22 DIAGNOSIS — M5442 Lumbago with sciatica, left side: Secondary | ICD-10-CM | POA: Diagnosis not present

## 2019-01-22 DIAGNOSIS — R200 Anesthesia of skin: Secondary | ICD-10-CM

## 2019-01-22 DIAGNOSIS — M961 Postlaminectomy syndrome, not elsewhere classified: Secondary | ICD-10-CM | POA: Diagnosis not present

## 2019-01-22 DIAGNOSIS — Z789 Other specified health status: Secondary | ICD-10-CM

## 2019-01-22 DIAGNOSIS — E559 Vitamin D deficiency, unspecified: Secondary | ICD-10-CM

## 2019-01-22 DIAGNOSIS — Z79891 Long term (current) use of opiate analgesic: Secondary | ICD-10-CM

## 2019-01-22 DIAGNOSIS — M5441 Lumbago with sciatica, right side: Secondary | ICD-10-CM

## 2019-01-22 MED ORDER — VITAMIN D3 125 MCG (5000 UT) PO CAPS: 1.0000 | ORAL_CAPSULE | Freq: Every day | ORAL | 0 refills | Status: DC

## 2019-01-22 MED ORDER — OXYCODONE HCL 10 MG PO TABS: 10.0000 mg | ORAL_TABLET | Freq: Four times a day (QID) | ORAL | 0 refills | Status: DC | PRN

## 2019-01-22 MED ORDER — GNP CALCIUM 1200 1200-1000 MG-UNIT PO CHEW: 1200.0000 mg | CHEWABLE_TABLET | Freq: Every day | ORAL | 0 refills | Status: DC

## 2019-01-22 MED ORDER — NALOXONE HCL 2 MG/2ML IJ SOSY: PREFILLED_SYRINGE | INTRAMUSCULAR | 1 refills | Status: DC

## 2019-01-22 MED ORDER — MAGNESIUM 500 MG PO CAPS: 500.0000 mg | ORAL_CAPSULE | Freq: Two times a day (BID) | ORAL | 0 refills | Status: DC

## 2019-01-22 NOTE — Patient Instructions (Signed)

## 2019-01-25 ENCOUNTER — Encounter: Payer: BLUE CROSS/BLUE SHIELD | Admitting: Nurse Practitioner

## 2019-05-08 ENCOUNTER — Telehealth: Payer: Self-pay

## 2019-05-08 NOTE — Telephone Encounter (Signed)
Pt was called, no answer or answering service.

## 2019-05-09 ENCOUNTER — Telehealth: Payer: Self-pay | Admitting: *Deleted

## 2019-05-09 ENCOUNTER — Other Ambulatory Visit: Payer: Self-pay

## 2019-05-09 ENCOUNTER — Encounter: Payer: Self-pay | Admitting: Pain Medicine

## 2019-05-09 ENCOUNTER — Ambulatory Visit: Payer: Medicaid Other | Attending: Pain Medicine | Admitting: Pain Medicine

## 2019-05-09 DIAGNOSIS — M5442 Lumbago with sciatica, left side: Secondary | ICD-10-CM

## 2019-05-09 DIAGNOSIS — G894 Chronic pain syndrome: Secondary | ICD-10-CM | POA: Diagnosis not present

## 2019-05-09 DIAGNOSIS — M25562 Pain in left knee: Secondary | ICD-10-CM | POA: Diagnosis not present

## 2019-05-09 DIAGNOSIS — Z79899 Other long term (current) drug therapy: Secondary | ICD-10-CM

## 2019-05-09 DIAGNOSIS — G8929 Other chronic pain: Secondary | ICD-10-CM

## 2019-05-09 DIAGNOSIS — M5441 Lumbago with sciatica, right side: Secondary | ICD-10-CM

## 2019-05-09 MED ORDER — OXYCODONE HCL 10 MG PO TABS: 10.0000 mg | ORAL_TABLET | Freq: Four times a day (QID) | ORAL | 0 refills | Status: DC | PRN

## 2019-05-09 NOTE — Progress Notes (Signed)
Pain Management Virtual Encounter Note - Virtual Visit via Telephone Telehealth (real-time audio visits between healthcare provider and patient).   Patient's Phone No. & Preferred Pharmacy:  [phone] (home); [phone] (mobile); (Preferred) [phone] [email or web address]  [place], [place] - [address] [place] [address] Phone: [phone] Fax: [phone]    Pre-screening note:  Our staff contacted Nathan Lambert and offered him an "in person", "face-to-face" appointment versus a telephone encounter. He indicated preferring the telephone encounter, at this time.   Reason for Virtual Visit: COVID-19*  Social distancing based on CDC and AMA recommendations.   I contacted Nathan Lambert. on 05/09/2019 via telephone.      I clearly identified myself as Gaspar Cola, MD. I verified that I was speaking with the correct person using two identifiers (Name: Nathan Vandenberghe., and date of birth: Aug 23, 1980).  Advanced Informed Consent I sought verbal advanced consent from Nathan Lambert. for virtual visit interactions. I informed Nathan Lambert of possible security and privacy concerns, risks, and limitations associated with providing "not-in-person" medical evaluation and management services. I also informed Nathan Lambert of the availability of "in-person" appointments. Finally, I informed him that there would be a charge for the virtual visit and that he could be  personally, fully or partially, financially responsible for it. Nathan Lambert expressed understanding and agreed to proceed.   Historic Elements   Mr. Nathan Jeune. is a 38 y.o. year old, male patient evaluated today after his last encounter by our practice on 05/08/2019. 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 He also  has a past surgical history that includes Fracture surgery (Left); Spine surgery (Right, 06/16/2015); and Back surgery. Nathan Lambert has a current medication list which includes the following prescription(s): gnp calcium 1200, vitamin d3, loratadine, magnesium, montelukast, naloxone, testosterone cypionate, oxycodone hcl, oxycodone hcl, oxycodone hcl, and viagra. He  reports that he has been smoking cigarettes. He has a 10.00 pack-year smoking history. He has never used smokeless tobacco. He reports that he does not drink alcohol or use drugs. Nathan Lambert is allergic to bactrim [sulfamethoxazole-trimethoprim].   HPI  Today, he is being contacted for medication management.  The patient indicates doing well with the current medication regimen. No adverse reactions or side effects reported to the medications.   Pharmacotherapy Assessment  Analgesic: Oxycodone IR 10 mg 4 times daily MME/day: 60mg /day.   Monitoring: Pharmacotherapy: No side-effects or adverse reactions reported. [place] PMP: PDMP reviewed during this encounter.       Compliance: No problems identified. Effectiveness: Clinically acceptable. Plan: Refer to "POC".  UDS:  Summary  Date Value Ref Range Status  05/04/2018 FINAL  Final    Comment:    ==================================================================== TOXASSURE SELECT 13 (MW) ==================================================================== Test                             Result       Flag       Units Drug Present and Declared for Prescription Verification   Oxycodone                      594          EXPECTED   ng/mg creat  Oxymorphone                    523          EXPECTED   ng/mg creat   Noroxycodone                   726          EXPECTED   ng/mg creat   Noroxymorphone                 141          EXPECTED   ng/mg creat    Sources of oxycodone are  scheduled prescription medications.    Oxymorphone, noroxycodone, and noroxymorphone are expected    metabolites of oxycodone. Oxymorphone is also available as a    scheduled prescription medication. ==================================================================== Test                      Result    Flag   Units      Ref Range   Creatinine              135              mg/dL      >=96>=20 ==================================================================== Declared Medications:  The flagging and interpretation on this report are based on the  following declared medications.  Unexpected results may arise from  inaccuracies in the declared medications.  **Note: The testing scope of this panel includes these medications:  Oxycodone  **Note: The testing scope of this panel does not include following  reported medications:  Gabapentin  Loratadine  Montelukast (Singulair)  Naloxone (Narcan)  Sildenafil (Viagra)  Testosterone ==================================================================== For clinical consultation, please call [phone]. ====================================================================    Laboratory Chemistry Profile (12 mo)  Renal: 05/17/2018: BUN 13; Creatinine, Ser 0.85  Lab Results  Component Value Date   GFRAA >60 05/17/2018   GFRNONAA >60 05/17/2018   Hepatic: 05/17/2018: Albumin 4.1 Lab Results  Component Value Date   AST 13 (L) 05/17/2018   ALT 15 05/17/2018   Other: 05/17/2018: CRP 1.3; Sed Rate 10; Vit D, 25-Hydroxy 18.6; Vitamin B-12 222 Note: Above Lab results reviewed.  Imaging  Last 90 days:  No results found.  Assessment  The primary encounter diagnosis was Chronic pain syndrome. Diagnoses of Chronic low back pain (Primary Area of Pain) (Bilateral) (R>L), Chronic knee pain (Secondary Area of pain) (Left), and Pharmacologic therapy were also pertinent to this visit.  Plan of Care  I am having Nathan L. Taflinger Jr. start on  Oxycodone HCl and Oxycodone HCl. I am also having him maintain his loratadine, montelukast, testosterone cypionate, Viagra, naloxone, GNP Calcium 1200, Vitamin D3, Magnesium, and Oxycodone HCl.  Pharmacotherapy (Medications Ordered): Meds ordered this encounter  Medications  . Oxycodone HCl 10 MG TABS    Sig: Take 1 tablet (10 mg total) by mouth every 6 (six) hours as needed. Must last 30 days    Dispense:  120 tablet    Refill:  0    Chronic Pain: STOP Act (Not applicable) Fill 1 day early if closed on refill date. Do not fill until: 05/11/2019. To last until: 06/10/2019. Avoid benzodiazepines within 8 hours of opioids  . Oxycodone HCl 10 MG TABS    Sig: Take 1 tablet (10 mg total) by mouth every 6 (six) hours as needed. Must last 30 days    Dispense:  120 tablet    Refill:  0  Chronic Pain: STOP Act (Not applicable) Fill 1 day early if closed on refill date. Do not fill until: 06/10/2019. To last until: 07/10/2019. Avoid benzodiazepines within 8 hours of opioids  . Oxycodone HCl 10 MG TABS    Sig: Take 1 tablet (10 mg total) by mouth every 6 (six) hours as needed. Must last 30 days    Dispense:  120 tablet    Refill:  0    Chronic Pain: STOP Act (Not applicable) Fill 1 day early if closed on refill date. Do not fill until: 07/10/2019. To last until: 08/09/2019. Avoid benzodiazepines within 8 hours of opioids   Orders:  Orders Placed This Encounter  Procedures  . ToxASSURE Select 13 (MW), Urine    Volume: 30 ml(s). Minimum 3 ml of urine is needed. Document temperature of fresh sample. Indications: Long term (current) use of opiate analgesic (Z79.891)  . Comp. Metabolic Panel (12)    With GFR. Indications: Chronic Pain Syndrome (G89.4) & Pharmacotherapy (X54.008)    Order Specific Question:   Has the patient fasted?    Answer:   No    Order Specific Question:   CC Results    Answer:   PCP-NURSE [[id number]]   Follow-up plan:   Return in about 13 weeks (around 08/08/2019) for (VV),  (MM).      Considering:   Diagnostic left SI joint block Possible left SI joint RFA Diagnostic caudal ESI + diagnostic epidurogram Possible Racz procedure Diagnostic left IA knee injection (w/ steroid) Possible left IA Hyalgan knee injections Diagnostic left Genicular NB Possible left Genicular nerve RFA   Palliative PRN treatment(s):   Palliative bilateral lumbar facet block  Palliative right SI joint block  Palliative right lumbar facet + sacroiliac joint RFA #2(last one done on 10/11/2017) Palliative left lumbar facet RFA #2(last one done on 11/24/2017)  Diagnostic right L4 TFES2 #1under fluoro and IV sedation.    Recent Visits No visits were found meeting these conditions.  Showing recent visits within past 90 days and meeting all other requirements   Today's Visits Date Type Provider Dept  05/09/19 Office Visit Delano Metz, MD [facility]  Showing today's visits and meeting all other requirements   Future Appointments No visits were found meeting these conditions.  Showing future appointments within next 90 days and meeting all other requirements   I discussed the assessment and treatment plan with the patient. The patient was provided an opportunity to ask questions and all were answered. The patient agreed with the plan and demonstrated an understanding of the instructions.  Patient advised to call back or seek an in-person evaluation if the symptoms or condition worsens.  Total duration of non-face-to-face encounter: 12 minutes.  Note by: Oswaldo Done, MD Date: 05/09/2019; Time: 11:40 AM  Note: This dictation was prepared with Dragon dictation. Any transcriptional errors that may result from this process are unintentional.  Disclaimer:  * Given the special circumstances of the COVID-19 pandemic, the federal government has announced that the Office for Civil Rights (OCR) will exercise its enforcement discretion and will not impose  penalties on physicians using telehealth in the event of noncompliance with regulatory requirements under the [employer] Portability and Accountability Act (HIPAA) in connection with the good faith provision of telehealth during the COVID-19 national public health emergency. (AMA)

## 2019-05-09 NOTE — Patient Instructions (Signed)
____________________________________________________________________________________________  Drug Holidays (Slow)  What is a "Drug Holiday"? Drug Holiday: is the name given to the period of time during which a patient stops taking a medication(s) for the purpose of eliminating tolerance to the drug.  Benefits . Improved effectiveness of opioids. . Decreased opioid dose needed to achieve benefits. . Improved pain with lesser dose.  What is tolerance? Tolerance: is the progressive decreased in effectiveness of a drug due to its repetitive use. With repetitive use, the body gets use to the medication and as a consequence, it loses its effectiveness. This is a common problem seen with opioid pain medications. As a result, a larger dose of the drug is needed to achieve the same effect that used to be obtained with a smaller dose.  How long should a "Drug Holiday" last? You should stay off of the pain medicine for at least 14 consecutive days. (2 weeks)  Should I stop the medicine "cold turkey"? No. You should always coordinate with your Pain Specialist so that he/she can provide you with the correct medication dose to make the transition as smoothly as possible.  How do I stop the medicine? Slowly. You will be instructed to decrease the daily amount of pills that you take by one (1) pill every seven (7) days. This is called a "slow downward taper" of your dose. For example: if you normally take four (4) pills per day, you will be asked to drop this dose to three (3) pills per day for seven (7) days, then to two (2) pills per day for seven (7) days, then to one (1) per day for seven (7) days, and at the end of those last seven (7) days, this is when the "Drug Holiday" would start.   Will I have withdrawals? By doing a "slow downward taper" like this one, it is unlikely that you will experience any significant withdrawal symptoms. Typically, what triggers withdrawals is the sudden stop of a high  dose opioid therapy. Withdrawals can usually be avoided by slowly decreasing the dose over a prolonged period of time.  What are withdrawals? Withdrawals: refers to the wide range of symptoms that occur after stopping or dramatically reducing opiate drugs after heavy and prolonged use. Withdrawal symptoms do not occur to patients that use low dose opioids, or those who take the medication sporadically. Contrary to benzodiazepine (example: Valium, Xanax, etc.) or alcohol withdrawals ("Delirium Tremens"), opioid withdrawals are not lethal. Withdrawals are the physical manifestation of the body getting rid of the excess receptors.  Expected Symptoms Early symptoms of withdrawal may include: . Agitation . Anxiety . Muscle aches . Increased tearing . Insomnia . Runny nose . Sweating . Yawning  Late symptoms of withdrawal may include: . Abdominal cramping . Diarrhea . Dilated pupils . Goose bumps . Nausea . Vomiting  Will I experience withdrawals? Due to the slow nature of the taper, it is very unlikely that you will experience any.  What is a slow taper? Taper: refers to the gradual decrease in dose.  ___________________________________________________________________________________________    ____________________________________________________________________________________________  Medication Rules  Purpose: To inform patients, and their family members, of our rules and regulations.  Applies to: All patients receiving prescriptions (written or electronic).  Pharmacy of record: Pharmacy where electronic prescriptions will be sent. If written prescriptions are taken to a different pharmacy, please inform the nursing staff. The pharmacy listed in the electronic medical record should be the one where you would like electronic prescriptions to be sent.  Electronic   prescriptions: In compliance with the [place] Strengthen Opioid Misuse Prevention (STOP) Act of 2017 (Session  Law 2017-74/H243), effective July 19, 2018, all controlled substances must be electronically prescribed. Calling prescriptions to the pharmacy will cease to exist.  Prescription refills: Only during scheduled appointments. Applies to all prescriptions.  NOTE: The following applies primarily to controlled substances (Opioid* Pain Medications).   Patient's responsibilities: 1. Pain Pills: Bring all pain pills to every appointment (except for procedure appointments). 2. Pill Bottles: Bring pills in original pharmacy bottle. Always bring the newest bottle. Bring bottle, even if empty. 3. Medication refills: You are responsible for knowing and keeping track of what medications you take and those you need refilled. The day before your appointment: write a list of all prescriptions that need to be refilled. The day of the appointment: give the list to the admitting nurse. Prescriptions will be written only during appointments. No prescriptions will be written on procedure days. If you forget a medication: it will not be "Called in", "Faxed", or "electronically sent". You will need to get another appointment to get these prescribed. No early refills. Do not call asking to have your prescription filled early. 4. Prescription Accuracy: You are responsible for carefully inspecting your prescriptions before leaving our office. Have the discharge nurse carefully go over each prescription with you, before taking them home. Make sure that your name is accurately spelled, that your address is correct. Check the name and dose of your medication to make sure it is accurate. Check the number of pills, and the written instructions to make sure they are clear and accurate. Make sure that you are given enough medication to last until your next medication refill appointment. 5. Taking Medication: Take medication as prescribed. When it comes to controlled substances, taking less pills or less frequently than prescribed is  permitted and encouraged. Never take more pills than instructed. Never take medication more frequently than prescribed.  6. Inform other Doctors: Always inform, all of your healthcare providers, of all the medications you take. 7. Pain Medication from other Providers: You are not allowed to accept any additional pain medication from any other Doctor or Healthcare provider. There are two exceptions to this rule. (see below) In the event that you require additional pain medication, you are responsible for notifying us, as stated below. 8. Medication Agreement: You are responsible for carefully reading and following our Medication Agreement. This must be signed before receiving any prescriptions from our practice. Safely store a copy of your signed Agreement. Violations to the Agreement will result in no further prescriptions. (Additional copies of our Medication Agreement are available upon request.) 9. Laws, Rules, & Regulations: All patients are expected to follow all Federal and State Laws, Statutes, Rules, & Regulations. Ignorance of the Laws does not constitute a valid excuse. The use of any illegal substances is prohibited. 10. Adopted CDC guidelines & recommendations: Target dosing levels will be at or below 60 MME/day. Use of benzodiazepines** is not recommended.  Exceptions: There are only two exceptions to the rule of not receiving pain medications from other Healthcare Providers. 1. Exception #1 (Emergencies): In the event of an emergency (i.e.: accident requiring emergency care), you are allowed to receive additional pain medication. However, you are responsible for: As soon as you are able, call our office [phone], at any time of the day or night, and leave a message stating your name, the date and nature of the emergency, and the name and dose of the medication   prescribed. In the event that your call is answered by a member of our staff, make sure to document and save the date, time, and  the name of the person that took your information.  2. Exception #2 (Planned Surgery): In the event that you are scheduled by another doctor or dentist to have any type of surgery or procedure, you are allowed (for a period no longer than 30 days), to receive additional pain medication, for the acute post-op pain. However, in this case, you are responsible for picking up a copy of our "Post-op Pain Management for Surgeons" handout, and giving it to your surgeon or dentist. This document is available at our office, and does not require an appointment to obtain it. Simply go to our office during business hours (Monday-Thursday from 8:00 AM to 4:00 PM) (Friday 8:00 AM to 12:00 Noon) or if you have a scheduled appointment with us, prior to your surgery, and ask for it by name. In addition, you will need to provide us with your name, name of your surgeon, type of surgery, and date of procedure or surgery.  *Opioid medications include: morphine, codeine, oxycodone, oxymorphone, hydrocodone, hydromorphone, meperidine, tramadol, tapentadol, buprenorphine, fentanyl, methadone. **Benzodiazepine medications include: diazepam (Valium), alprazolam (Xanax), clonazepam (Klonopine), lorazepam (Ativan), clorazepate (Tranxene), chlordiazepoxide (Librium), estazolam (Prosom), oxazepam (Serax), temazepam (Restoril), triazolam (Halcion) (Last updated: 09/15/2017) ____________________________________________________________________________________________   ____________________________________________________________________________________________  Medication Recommendations and Reminders  Applies to: All patients receiving prescriptions (written and/or electronic).  Medication Rules & Regulations: These rules and regulations exist for your safety and that of others. They are not flexible and neither are we. Dismissing or ignoring them will be considered "non-compliance" with medication therapy, resulting in complete and  irreversible termination of such therapy. (See document titled "Medication Rules" for more details.) In all conscience, because of safety reasons, we cannot continue providing a therapy where the patient does not follow instructions.  Pharmacy of record:   Definition: This is the pharmacy where your electronic prescriptions will be sent.   We do not endorse any particular pharmacy.  You are not restricted in your choice of pharmacy.  The pharmacy listed in the electronic medical record should be the one where you want electronic prescriptions to be sent.  If you choose to change pharmacy, simply notify our nursing staff of your choice of new pharmacy.  Recommendations:  Keep all of your pain medications in a safe place, under lock and key, even if you live alone.   After you fill your prescription, take 1 week's worth of pills and put them away in a safe place. You should keep a separate, properly labeled bottle for this purpose. The remainder should be kept in the original bottle. Use this as your primary supply, until it runs out. Once it's gone, then you know that you have 1 week's worth of medicine, and it is time to come in for a prescription refill. If you do this correctly, it is unlikely that you will ever run out of medicine.  To make sure that the above recommendation works, it is very important that you make sure your medication refill appointments are scheduled at least 1 week before you run out of medicine. To do this in an effective manner, make sure that you do not leave the office without scheduling your next medication management appointment. Always ask the nursing staff to show you in your prescription , when your medication will be running out. Then arrange for the receptionist to get you a return   appointment, at least 7 days before you run out of medicine. Do not wait until you have 1 or 2 pills left, to come in. This is very poor planning and does not take into consideration  that we may need to cancel appointments due to bad weather, sickness, or emergencies affecting our staff.  "Partial Fill": If for any reason your pharmacy does not have enough pills/tablets to completely fill or refill your prescription, do not allow for a "partial fill". You will need a separate prescription to fill the remaining amount, which we will not provide. If the reason for the partial fill is your insurance, you will need to talk to the pharmacist about payment alternatives for the remaining tablets, but again, do not accept a partial fill.  Prescription refills and/or changes in medication(s):   Prescription refills, and/or changes in dose or medication, will be conducted only during scheduled medication management appointments. (Applies to both, written and electronic prescriptions.)  No refills on procedure days. No medication will be changed or started on procedure days. No changes, adjustments, and/or refills will be conducted on a procedure day. Doing so will interfere with the diagnostic portion of the procedure.  No phone refills. No medications will be "called into the pharmacy".  No Fax refills.  No weekend refills.  No Holliday refills.  No after hours refills.  Remember:  Business hours are:  Monday to Thursday 8:00 AM to 4:00 PM Provider's Schedule: Sharonne Ricketts, MD - Appointments are:  Medication management: Monday and Wednesday 8:00 AM to 4:00 PM Procedure day: Tuesday and Thursday 7:30 AM to 4:00 PM Bilal Lateef, MD - Appointments are:  Medication management: Tuesday and Thursday 8:00 AM to 4:00 PM Procedure day: Monday and Wednesday 7:30 AM to 4:00 PM (Last update: 09/15/2017) ____________________________________________________________________________________________    

## 2019-06-07 ENCOUNTER — Other Ambulatory Visit (HOSPITAL_COMMUNITY)
Admission: RE | Admit: 2019-06-07 | Discharge: 2019-06-07 | Disposition: A | Discharge status: Home / Self Care | Payer: BLUE CROSS/BLUE SHIELD | Source: Ambulatory Visit | Attending: Pain Medicine | Admitting: Pain Medicine

## 2019-06-07 DIAGNOSIS — M899 Disorder of bone, unspecified: Secondary | ICD-10-CM | POA: Insufficient documentation

## 2019-06-07 DIAGNOSIS — Z789 Other specified health status: Secondary | ICD-10-CM | POA: Insufficient documentation

## 2019-06-07 DIAGNOSIS — Z79899 Other long term (current) drug therapy: Secondary | ICD-10-CM | POA: Insufficient documentation

## 2019-06-07 DIAGNOSIS — G894 Chronic pain syndrome: Secondary | ICD-10-CM | POA: Insufficient documentation

## 2019-06-07 LAB — SEDIMENTATION RATE: Sed Rate: 17 mm/hr — ABNORMAL HIGH (ref 0–16)

## 2019-06-07 LAB — COMPREHENSIVE METABOLIC PANEL
ALT: 17 U/L (ref 0–44)
AST: 14 U/L — ABNORMAL LOW (ref 15–41)
Albumin: 4 g/dL (ref 3.5–5.0)
Alkaline Phosphatase: 50 U/L (ref 38–126)
Anion gap: 11 (ref 5–15)
BUN: 10 mg/dL (ref 6–20)
CO2: 23 mmol/L (ref 22–32)
Calcium: 9 mg/dL (ref 8.9–10.3)
Chloride: 104 mmol/L (ref 98–111)
Creatinine, Ser: 0.76 mg/dL (ref 0.61–1.24)
GFR calc Af Amer: >60 mL/min (ref >60)
GFR calc non Af Amer: >60 mL/min (ref >60)
Glucose, Bld: 115 mg/dL — ABNORMAL HIGH (ref 70–99)
Potassium: 4.3 mmol/L (ref 3.5–5.1)
Sodium: 138 mmol/L (ref 135–145)
Total Bilirubin: 0.4 mg/dL (ref 0.3–1.2)
Total Protein: 7.6 g/dL (ref 6.5–8.1)

## 2019-06-07 LAB — MAGNESIUM: Magnesium: 2 mg/dL (ref 1.7–2.4)

## 2019-06-07 LAB — C-REACTIVE PROTEIN: CRP: 2.7 mg/dL — ABNORMAL HIGH (ref <1.0)

## 2019-06-07 LAB — VITAMIN D 25 HYDROXY (VIT D DEFICIENCY, FRACTURES): Vit D, 25-Hydroxy: 15.51 ng/mL — ABNORMAL LOW (ref 30–100)

## 2019-06-07 LAB — VITAMIN B12: Vitamin B-12: 283 pg/mL (ref 180–914)

## 2019-06-11 LAB — MISC LABCORP TEST (SEND OUT): Labcorp test code: 738526

## 2019-06-28 NOTE — Progress Notes (Signed)
Test: Blood sugar (Glucose levels) Finding(s): High (hyperglycemia) Normal Level(s): Normal fasting (NPO x 8 hours) glucose levels are between 65-99 mg/dl, with 2 hour fasting, levels are usually less than 140 mg/dl. Clinical significance: Any random blood glucose level greater than 200 mg/dl is considered to be Diabetes. Signs and symptoms may include: (when persistently above 180 mg/dL) Increased thirst; headaches; trouble concentrating; blurred vision; frequent peeing (urination); fatigue (weakness and tired feeling); weight loss. The most common and classical symptoms of an undiagnosed diabetes with hyperglycemia are: Increased urinary frequency (polyuria), thirst (polydipsia), hunger (polyphagia), and unexplained weight loss; numbness in the extremities, pain in feet (dysesthesias), fatigue, and blurred vision; recurrent or severe infections; loss of consciousness or severe nausea/vomiting (ketoacidosis) or coma. Patient Recommendation(s): Fasting levels above 140 mg/dL or any levels above 200 mg/dL should follow-up with PCP (Primary Care Physician) for further evaluation. ___________________________________________________________________________________ Aspartate transaminase (AST) or aspartate aminotransferase: Also known as: serum glutamic oxaloacetic transaminase (SGOT) - Low levels of AST in the blood are expected and may be normal. AST levels are often compared with results of other tests such as alkaline phosphatase (ALP), total protein, and bilirubin to help determine which form of liver disease is present. AST is often measured to monitor treatment of persons with liver disease and may be ordered either by itself or along with other tests for this purpose. Sometimes AST may be used to monitor people who are taking medications that are potentially toxic to the liver. If AST levels increase, then the person may be switched to another medication. AST is compared directly to ALT and an  AST/ALT ratio is calculated. This ratio may be used to distinguish between different causes of liver damage and to distinguish liver injury from damage to heart or muscle. ___________________________________________________________________________________ 

## 2019-06-28 NOTE — Progress Notes (Signed)
-  The combined elevation of the ESR & CRP, may be suggestive of an autoimmune disease. Patients with elevated levels of both should consider an evaluation by a Rheumatologist. ___________________________________________________________________________________   Test: ESR (Erythrocyte Sedimentation Rate) levels Finding(s): Elevated  Normal Level(s): below 30 mm/hr. Clinical significance: The sed rate is an acute phase reactant that indirectly measures the degree of inflammation present in the body. It can be acute, developing rapidly after trauma, injury or infection, for example, or can occur over an extended time (chronic) with conditions such as autoimmune diseases or cancer. The ESR is not diagnostic; it is a non-specific, screening test that may be elevated in a number of these different conditions. It provides general information about the presence or absence of an inflammatory condition. Signs and symptoms may include: None Possible causes:  - Most common: inflammation Patient Recommendation(s): Unless contraindicated, consider the use of anti-inflammatory diet and medications. (visit http://inflammationfactor.com/ ) ___________________________________________________________________________________   - The combined elevation of the ESR & CRP, may be suggestive of an autoimmune disease. Patients with elevated levels of both should consider an evaluation by a Rheumatologist. ___________________________________________________________________________________

## 2019-06-28 NOTE — Progress Notes (Signed)

## 2019-07-31 ENCOUNTER — Other Ambulatory Visit: Payer: Self-pay | Admitting: Nurse Practitioner

## 2019-07-31 DIAGNOSIS — E559 Vitamin D deficiency, unspecified: Secondary | ICD-10-CM

## 2019-08-02 ENCOUNTER — Encounter: Payer: Self-pay | Admitting: Pain Medicine

## 2019-08-05 NOTE — Progress Notes (Signed)
Patient: Nathan Lambert.  Service Category: E/M  Provider: Gaspar Cola, MD  DOB: August 07, 1980  DOS: 08/06/2019  Location: Office  MRN: [id number]  Setting: Ambulatory outpatient  Referring Provider: Vidal Schwalbe, MD  Type: Established Patient  Specialty: Interventional Pain Management  PCP: Vidal Schwalbe, MD  Location: Remote location  Delivery: TeleHealth     Virtual Encounter - Pain Management PROVIDER NOTE: Information contained herein reflects review and annotations entered in association with encounter. Interpretation of such information and data should be left to medically-trained personnel. Information provided to patient can be located elsewhere in the medical record under "Patient Instructions". Document created using STT-dictation technology, any transcriptional errors that may result from process are unintentional.    Contact & Pharmacy Preferred: [phone] Home: [phone] (home) Mobile: [phone] (mobile) E-mail: [email or web address]  [place], [place] [address] [place] [address] Phone: [phone] Fax: [phone]   Pre-screening  Nathan Lambert offered "in-person" vs "virtual" encounter. He indicated preferring virtual for this encounter.   Reason COVID-19*  Social distancing based on CDC and AMA recommendations.   I contacted Nathan Lambert. on 08/06/2019 via telephone.      I clearly identified myself as Gaspar Cola, MD. I verified that I was speaking with the correct person using two identifiers (Name: Nathan Lambert., and date of birth: 1981/01/10).  Consent I sought verbal advanced consent from Nathan Lambert. for virtual visit interactions. I informed Nathan Lambert of possible security and privacy concerns, risks, and limitations associated with providing "not-in-person" medical evaluation and management services. I also informed Nathan Lambert  of the availability of "in-person" appointments. Finally, I informed him that there would be a charge for the virtual visit and that he could be  personally, fully or partially, financially responsible for it. Nathan Lambert expressed understanding and agreed to proceed.   Historic Elements   [name] is a 39 y.o. year old, male patient evaluated today after his last encounter by our practice on 07/31/2019. 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 He also  has a past surgical history that includes Fracture surgery (Left); Spine surgery (Right, 06/16/2015); and Back surgery. Nathan Lambert has a current medication list which includes the following prescription(s): gnp calcium 1200, vitamin d3, ergocalciferol, loratadine, magnesium, meloxicam, montelukast, naloxone, [START ON 08/09/2019] oxycodone hcl, [START ON 09/08/2019] oxycodone hcl, and [START ON 10/08/2019] oxycodone hcl. He  reports that he has been smoking cigarettes. He has a 10.00 pack-year smoking history. He has never used smokeless tobacco. He reports that he does not drink alcohol or use drugs. Nathan Lambert is allergic to bactrim [sulfamethoxazole-trimethoprim].   HPI  Today, he is being contacted for medication management. The patient indicates doing well with the current medication regimen. No adverse reactions or side effects reported to the medications.  Today we went over the results of his recent lab work.  His CRP and sed rate are still elevated and in fact they seem to be worse than when he was last tested a year ago.  His vitamin D also shows that he has a deficiency and therefore we will be calling in some medication to the pharmacy and I have recommended that he start taking at least 5000 international units of D3  every day.  In addition I will be sending a prescription for the D2.  I have recommended that he read off on anti-inflammatory diets.  I reminded him that this is not a type of weight control diet but one to bring down inflammation and therefore he needs to be careful on the choices that he makes.  (http://www.kim.net/) In addition to this and since he is not having any problems with his kidneys, I will be doing a trial of meloxicam 15 mg p.o. daily.  Pharmacotherapy Assessment  Analgesic: Oxycodone IR 10 mg 4 times daily MME/day: 60mg /day.   Monitoring: Pharmacotherapy: No side-effects or adverse reactions reported. [place] PMP: PDMP reviewed during this encounter.       Compliance: No problems identified. Effectiveness: Clinically acceptable. Plan: Refer to "POC".  UDS:  Summary  Date Value Ref Range Status  05/04/2018 FINAL  Final    Comment:    ==================================================================== TOXASSURE SELECT 13 (MW) ==================================================================== Test                             Result       Flag       Units Drug Present and Declared for Prescription Verification   Oxycodone                      594          EXPECTED   ng/mg creat   Oxymorphone                    523          EXPECTED   ng/mg creat   Noroxycodone                   726          EXPECTED   ng/mg creat   Noroxymorphone                 141          EXPECTED   ng/mg creat    Sources of oxycodone are scheduled prescription medications.    Oxymorphone, noroxycodone, and noroxymorphone are expected    metabolites of oxycodone. Oxymorphone is also available as a    scheduled prescription medication. ==================================================================== Test                      Result    Flag   Units      Ref Range   Creatinine              135              mg/dL       05/06/2018 ==================================================================== Declared Medications:  The flagging and interpretation on this report are based on the  following declared medications.  Unexpected results may arise from  inaccuracies in the declared medications.  **Note: The testing scope of this panel includes these medications:  Oxycodone  **Note: The testing scope of this panel does not include following  reported medications:  Gabapentin  Loratadine  Montelukast (Singulair)  Naloxone (Narcan)  Sildenafil (Viagra)  Testosterone ==================================================================== For clinical consultation, please call [phone]. ====================================================================    Laboratory Chemistry Profile (12 mo)  Renal: 06/07/2019: BUN 10; Creatinine, Ser 0.76  Lab Results  Component Value Date   GFRAA >60 06/07/2019   GFRNONAA >60 06/07/2019   Hepatic: 06/07/2019: Albumin 4.0 Lab Results  Component Value  Date   AST 14 (L) 06/07/2019   ALT 17 06/07/2019   Other: 06/07/2019: CRP 2.7; Sed Rate 17; Vit D, 25-Hydroxy 15.51; Vitamin B-12 283 Note: Above Lab results reviewed.  Imaging  DG C-Arm 1-60 Min-No Report Fluoroscopy was utilized by the requesting physician.  No radiographic  interpretation.    Assessment  The primary encounter diagnosis was Chronic low back pain (Primary Area of Pain) (Bilateral) (R>L). 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  Plan of Care  Problem-specific:  No problem-specific Assessment & Plan notes found for this encounter.  I have discontinued Ponce L. Graveline Jr.'s ergocalciferol, testosterone cypionate, Viagra, and  ergocalciferol. I am also having him start on Oxycodone HCl, Oxycodone HCl, meloxicam, and ergocalciferol. Additionally, I am having him maintain his loratadine, montelukast, naloxone, Oxycodone HCl, GNP Calcium 1200, Vitamin D3, and Magnesium.  Pharmacotherapy (Medications Ordered): Meds ordered this encounter  Medications  . Oxycodone HCl 10 MG TABS    Sig: Take 1 tablet (10 mg total) by mouth every 6 (six) hours as needed. Must last 30 days    Dispense:  120 tablet    Refill:  0    Chronic Pain: STOP Act (Not applicable) Fill 1 day early if closed on refill date. Do not fill until: 08/09/2019. To last until: 09/08/2019. Avoid benzodiazepines within 8 hours of opioids  . Oxycodone HCl 10 MG TABS    Sig: Take 1 tablet (10 mg total) by mouth every 6 (six) hours as needed. Must last 30 days    Dispense:  120 tablet    Refill:  0    Chronic Pain: STOP Act (Not applicable) Fill 1 day early if closed on refill date. Do not fill until: 09/08/2019. To last until: 10/08/2019. Avoid benzodiazepines within 8 hours of opioids  . Oxycodone HCl 10 MG TABS    Sig: Take 1 tablet (10 mg total) by mouth every 6 (six) hours as needed. Must last 30 days    Dispense:  120 tablet    Refill:  0    Chronic Pain: STOP Act (Not applicable) Fill 1 day early if closed on refill date. Do not fill until: 10/08/2019. To last until: 11/07/2019. Avoid benzodiazepines within 8 hours of opioids  . Calcium Carbonate-Vit D-Min (GNP CALCIUM 1200) 1200-1000 MG-UNIT CHEW    Sig: Chew 1,200 mg by mouth daily with breakfast. Take in combination with vitamin D and magnesium.    Dispense:  90 tablet    Refill:  3    Fill one day early if pharmacy is closed on scheduled refill date. May substitute for generic if available.  . Cholecalciferol (VITAMIN D3) 125 MCG (5000 UT) CAPS    Sig: Take 1 capsule (5,000 Units total) by mouth daily with breakfast. Take along with calcium and magnesium.    Dispense:  90 capsule    Refill:  3    Fill  one day early if pharmacy is closed on scheduled refill date. May substitute for generic if available.  . Magnesium 500 MG CAPS    Sig: Take 1 capsule (500 mg total) by mouth 2 (two) times daily at 8 am and 10 pm.    Dispense:  180 capsule    Refill:  3    Fill one day early if pharmacy is closed on  scheduled refill date. May substitute for generic if available.  . meloxicam (MOBIC) 15 MG tablet    Sig: Take 1 tablet (15 mg total) by mouth daily.    Dispense:  30 tablet    Refill:  5    Do not add this medication to the electronic "Automatic Refill" notification system. Patient may have prescription filled one day early if pharmacy is closed on scheduled refill date.  . ergocalciferol (VITAMIN D2) 1.25 MG (50000 UT) capsule    Sig: Take 1 capsule (50,000 Units total) by mouth 2 (two) times a week. X 6 weeks.    Dispense:  12 capsule    Refill:  1    Fill one day early if pharmacy is closed on scheduled refill date. May substitute for generic if available.   Orders:  No orders of the defined types were placed in this encounter.  Follow-up plan:   Return in about 3 months (around 11/07/2019) for (VV), (MM) to evaluate meloxicam trial.      Considering:   Diagnostic left SI joint block Possible left SI joint RFA Diagnostic caudal ESI + diagnostic epidurogram Possible Racz procedure Diagnostic left IA knee injection (w/ steroid) Possible left IA Hyalgan knee injections Diagnostic left Genicular NB Possible left Genicular nerve RFA   Palliative PRN treatment(s):   Palliative bilateral lumbar facet block  Palliative right SI joint block  Palliative right lumbar facet + sacroiliac joint RFA #2(last one done on 10/11/2017) Palliative left lumbar facet RFA #2(last one done on 11/24/2017)  Diagnostic right L4 TFES2 #1under fluoro and IV sedation.     Recent Visits Date Type Provider Dept  05/09/19 Office Visit Delano Metz, MD [facility]  Showing recent visits  within past 90 days and meeting all other requirements   Today's Visits Date Type Provider Dept  08/06/19 Telemedicine Delano Metz, MD [facility]  Showing today's visits and meeting all other requirements   Future Appointments No visits were found meeting these conditions.  Showing future appointments within next 90 days and meeting all other requirements   I discussed the assessment and treatment plan with the patient. The patient was provided an opportunity to ask questions and all were answered. The patient agreed with the plan and demonstrated an understanding of the instructions.  Patient advised to call back or seek an in-person evaluation if the symptoms or condition worsens.  Total duration of non-face-to-face encounter: 16 minutes.  Note by: Oswaldo Done, MD Date: 08/06/2019; Time: 9:54 AM

## 2019-08-06 ENCOUNTER — Other Ambulatory Visit: Payer: Self-pay

## 2019-08-06 ENCOUNTER — Ambulatory Visit: Payer: Medicaid Other | Attending: Pain Medicine | Admitting: Pain Medicine

## 2019-08-06 DIAGNOSIS — M25562 Pain in left knee: Secondary | ICD-10-CM | POA: Diagnosis not present

## 2019-08-06 DIAGNOSIS — M961 Postlaminectomy syndrome, not elsewhere classified: Secondary | ICD-10-CM

## 2019-08-06 DIAGNOSIS — E559 Vitamin D deficiency, unspecified: Secondary | ICD-10-CM

## 2019-08-06 DIAGNOSIS — M5441 Lumbago with sciatica, right side: Secondary | ICD-10-CM

## 2019-08-06 DIAGNOSIS — G8929 Other chronic pain: Secondary | ICD-10-CM

## 2019-08-06 DIAGNOSIS — R7982 Elevated C-reactive protein (CRP): Secondary | ICD-10-CM

## 2019-08-06 DIAGNOSIS — M150 Primary generalized (osteo)arthritis: Secondary | ICD-10-CM | POA: Insufficient documentation

## 2019-08-06 DIAGNOSIS — M545 Low back pain: Secondary | ICD-10-CM | POA: Diagnosis not present

## 2019-08-06 DIAGNOSIS — G894 Chronic pain syndrome: Secondary | ICD-10-CM

## 2019-08-06 DIAGNOSIS — M8949 Other hypertrophic osteoarthropathy, multiple sites: Secondary | ICD-10-CM

## 2019-08-06 DIAGNOSIS — R700 Elevated erythrocyte sedimentation rate: Secondary | ICD-10-CM | POA: Insufficient documentation

## 2019-08-06 DIAGNOSIS — M159 Polyosteoarthritis, unspecified: Secondary | ICD-10-CM | POA: Insufficient documentation

## 2019-08-06 MED ORDER — MAGNESIUM 500 MG PO CAPS: 500.0000 mg | ORAL_CAPSULE | Freq: Two times a day (BID) | ORAL | 3 refills | Status: DC

## 2019-08-06 MED ORDER — GNP CALCIUM 1200 1200-1000 MG-UNIT PO CHEW: 1200.0000 mg | CHEWABLE_TABLET | Freq: Every day | ORAL | 3 refills | Status: DC

## 2019-08-06 MED ORDER — ERGOCALCIFEROL 1.25 MG (50000 UT) PO CAPS: 50000.0000 [IU] | ORAL_CAPSULE | ORAL | 1 refills | Status: DC

## 2019-08-06 MED ORDER — OXYCODONE HCL 10 MG PO TABS: 10.0000 mg | ORAL_TABLET | Freq: Four times a day (QID) | ORAL | 0 refills | Status: DC | PRN

## 2019-08-06 MED ORDER — MELOXICAM 15 MG PO TABS: 15.0000 mg | ORAL_TABLET | Freq: Every day | ORAL | 5 refills | Status: AC

## 2019-08-06 MED ORDER — VITAMIN D3 125 MCG (5000 UT) PO CAPS: 1.0000 | ORAL_CAPSULE | Freq: Every day | ORAL | 3 refills | Status: DC

## 2019-08-06 NOTE — Patient Instructions (Signed)
Meloxicam tablets What is this medicine? MELOXICAM (mel OX i cam) is a non-steroidal anti-inflammatory drug (NSAID). It is used to reduce swelling and to treat pain. It may be used for osteoarthritis, rheumatoid arthritis, or juvenile rheumatoid arthritis. This medicine may be used for other purposes; ask your health care provider or pharmacist if you have questions. COMMON BRAND NAME(S): Mobic What should I tell my health care provider before I take this medicine? They need to know if you have any of these conditions:  bleeding disorders  cigarette smoker  coronary artery bypass graft (CABG) surgery within the past 2 weeks  drink more than 3 alcohol-containing drinks per day  heart disease  high blood pressure  history of stomach bleeding  kidney disease  liver disease  lung or breathing disease, like asthma  stomach or intestine problems  an unusual or allergic reaction to meloxicam, aspirin, other NSAIDs, other medicines, foods, dyes, or preservatives  pregnant or trying to get pregnant  breast-feeding How should I use this medicine? Take this medicine by mouth with a full glass of water. Follow the directions on the prescription label. You can take it with or without food. If it upsets your stomach, take it with food. Take your medicine at regular intervals. Do not take it more often than directed. Do not stop taking except on your doctor's advice. A special MedGuide will be given to you by the pharmacist with each prescription and refill. Be sure to read this information carefully each time. Talk to your pediatrician regarding the use of this medicine in children. While this drug may be prescribed for selected conditions, precautions do apply. Patients over 16 years old may have a stronger reaction and need a smaller dose. Overdosage: If you think you have taken too much of this medicine contact a poison control center or emergency room at once. NOTE: This medicine is  only for you. Do not share this medicine with others. What if I miss a dose? If you miss a dose, take it as soon as you can. If it is almost time for your next dose, take only that dose. Do not take double or extra doses. What may interact with this medicine? Do not take this medicine with any of the following medications:  cidofovir  ketorolac This medicine may also interact with the following medications:  aspirin and aspirin-like medicines  certain medicines for blood pressure, heart disease, irregular heart beat  certain medicines for depression, anxiety, or psychotic disturbances  certain medicines that treat or prevent blood clots like warfarin, enoxaparin, dalteparin, apixaban, dabigatran, rivaroxaban  cyclosporine  diuretics  fluconazole  lithium  methotrexate  other NSAIDs, medicines for pain and inflammation, like ibuprofen and naproxen  pemetrexed This list may not describe all possible interactions. Give your health care provider a list of all the medicines, herbs, non-prescription drugs, or dietary supplements you use. Also tell them if you smoke, drink alcohol, or use illegal drugs. Some items may interact with your medicine. What should I watch for while using this medicine? Tell your doctor or healthcare provider if your symptoms do not start to get better or if they get worse. This medicine may cause serious skin reactions. They can happen weeks to months after starting the medicine. Contact your healthcare provider right away if you notice fevers or flu-like symptoms with a rash. The rash may be red or purple and then turn into blisters or peeling of the skin. Or, you might notice a red rash with  swelling of the face, lips or lymph nodes in your neck or under your arms. Do not take other medicines that contain aspirin, ibuprofen, or naproxen with this medicine. Side effects such as stomach upset, nausea, or ulcers may be more likely to occur. Many medicines  available without a prescription should not be taken with this medicine. This medicine can cause ulcers and bleeding in the stomach and intestines at any time during treatment. This can happen with no warning and may cause death. There is increased risk with taking this medicine for a long time. Smoking, drinking alcohol, older age, and poor health can also increase risks. Call your doctor right away if you have stomach pain or blood in your vomit or stool. This medicine does not prevent heart attack or stroke. In fact, this medicine may increase the chance of a heart attack or stroke. The chance may increase with longer use of this medicine and in people who have heart disease. If you take aspirin to prevent heart attack or stroke, talk with your doctor or healthcare provider. What side effects may I notice from receiving this medicine? Side effects that you should report to your doctor or health care professional as soon as possible:  allergic reactions like skin rash, itching or hives, swelling of the face, lips, or tongue  nausea, vomiting  redness, blistering, peeling, or loosening of the skin, including inside the mouth  signs and symptoms of a blood clot such as breathing problems; changes in vision; chest pain; severe, sudden headache; pain, swelling, warmth in the leg; trouble speaking; sudden numbness or weakness of the face, arm, or leg  signs and symptoms of bleeding such as bloody or black, tarry stools; red or dark-brown urine; spitting up blood or brown material that looks like coffee grounds; red spots on the skin; unusual bruising or bleeding from the eye, gums, or nose  signs and symptoms of liver injury like dark yellow or brown urine; general ill feeling or flu-like symptoms; light-colored stools; loss of appetite; nausea; right upper belly pain; unusually weak or tired; yellowing of the eyes or skin  signs and symptoms of stroke like changes in vision; confusion; trouble  speaking or understanding; severe headaches; sudden numbness or weakness of the face, arm, or leg; trouble walking; dizziness; loss of balance or coordination Side effects that usually do not require medical attention (report to your doctor or health care professional if they continue or are bothersome):  constipation  diarrhea  gas This list may not describe all possible side effects. Call your doctor for medical advice about side effects. You may report side effects to FDA at 1-800-FDA-1088. Where should I keep my medicine? Keep out of the reach of children. Store at room temperature between 15 and 30 degrees C (59 and 86 degrees F). Throw away any unused medicine after the expiration date. NOTE: This sheet is a summary. It may not cover all possible information. If you have questions about this medicine, talk to your doctor, pharmacist, or health care provider.  2020 Elsevier/Gold Standard (2018-10-04 11:21:28)  Please see information regarding "ANTI-INFLAMMATORY DIET": Https://inflammationfactor.com/ __________________________________________________________________________________  Test: CRP (C-reactive protein) levels Finding(s): Elevated  Normal Level(s): Less than <1.0 mg/dL. Clinical significance: C-reactive protein (CRP) is produced by the liver. The level of CRP rises when there is inflammation throughout the body. CRP goes up in response to inflammation. High levels suggests the presence of chronic inflammation but do not identify its location or cause. Drops of previously elevated levels  suggest that the inflammation or infection is subsiding and/or responding to treatment. Signs and symptoms may include: Signs or symptoms, if present, would depend on the underlying inflammatory condition that is the cause of the elevated CRP level. Possible causes:  - Most common: High levels have been observed in obese patients, individuals with bacterial infections, chronic inflammation, or  flare-ups of inflammatory conditions. Patient Recommendation(s): Unless contraindicated, consider the use of anti-inflammatory diet and medications. (visit http://inflammationfactor.com/ ) ___________________________________________________________________________________  Test: ESR (Erythrocyte Sedimentation Rate) levels Finding(s): Elevated  Normal Level(s): below 30 mm/hr. Clinical significance: The sed rate is an acute phase reactant that indirectly measures the degree of inflammation present in the body. It can be acute, developing rapidly after trauma, injury or infection, for example, or can occur over an extended time (chronic) with conditions such as autoimmune diseases or cancer. The ESR is not diagnostic; it is a non-specific, screening test that may be elevated in a number of these different conditions. It provides general information about the presence or absence of an inflammatory condition. Signs and symptoms may include: None Possible causes:  - Most common: inflammation Patient Recommendation(s): Unless contraindicated, consider the use of anti-inflammatory diet and medications. (visit http://inflammationfactor.com/ ) ___________________________________________________________________________________  - The combined elevation of the ESR & CRP, may be suggestive of an autoimmune disease. Patients with elevated levels of both should consider an evaluation by a Rheumatologist. ___________________________________________________________________________________

## 2019-11-07 ENCOUNTER — Telehealth: Payer: Medicaid Other | Admitting: Pain Medicine

## 2019-11-09 NOTE — Progress Notes (Signed)
Patient: Nathan Lambert.  Service Category: E/M  Provider: Gaspar Cola, MD  DOB: [birth year]  DOS: 11/12/2019  Location: Office  MRN: [id number]  Setting: Ambulatory outpatient  Referring Provider: Vidal Schwalbe, MD  Type: Established Patient  Specialty: Interventional Pain Management  PCP: Nathan Schwalbe, MD  Location: Remote location  Delivery: TeleHealth     Virtual Encounter - Pain Management PROVIDER NOTE: Information contained herein reflects review and annotations entered in association with encounter. Interpretation of such information and data should be left to medically-trained personnel. Information provided to patient can be located elsewhere in the medical record under "Patient Instructions". Document created using STT-dictation technology, any transcriptional errors that may result from process are unintentional.    Contact & Pharmacy Preferred: [phone] Home: [phone] (home) Mobile: [phone] (mobile) E-mail: [email or web address]  [place] [place] [address] [place] [address] Phone: [phone] Fax: [phone]   Pre-screening  Nathan Lambert offered "in-person" vs "virtual" encounter. He indicated preferring virtual for this encounter.   Reason COVID-19*  Social distancing based on CDC and AMA recommendations.   I contacted Nathan Lambert. on 11/12/2019 via telephone.      I clearly identified myself as Gaspar Cola, MD. I verified that I was speaking with the correct person using two identifiers (Name: Nathan Hoeg., and date of birth: Jan 03, 1981).  Consent I sought verbal advanced consent from Nathan Lambert. for virtual visit interactions. I informed Nathan Lambert of possible security and privacy concerns, risks, and limitations associated with providing "not-in-person" medical evaluation and management services. I also informed Nathan Lambert  of the availability of "in-person" appointments. Finally, I informed him that there would be a charge for the virtual visit and that he could be  personally, fully or partially, financially responsible for it. Nathan Lambert expressed understanding and agreed to proceed.   Historic Elements   Mr. Kassim Lambert. is a 39 y.o. year old, male patient evaluated today after his last contact with our practice on 07/31/2019. 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 He also  has a past surgical history that includes Fracture surgery (Left); Spine surgery (Right, 06/16/2015); and Back surgery. Nathan Lambert has a current medication list which includes the following prescription(s): gnp calcium 1200, vitamin d3, loratadine, magnesium, meloxicam, montelukast, naloxone, metformin, oxycodone hcl, [START ON 12/12/2019] oxycodone hcl, and [START ON 01/11/2020] oxycodone hcl. He  reports that he has been smoking cigarettes. He has a 10.00 pack-year smoking history. He has never used smokeless tobacco. He reports that he does not drink alcohol or use drugs. Nathan Lambert is allergic to bactrim [sulfamethoxazole-trimethoprim].   HPI  Today, he is being contacted for medication management.  The patient indicates doing well with the current medication regimen. No adverse reactions or side effects reported to the medications.  Although he indicated not having any problems with the medications, he complained that he miscalculated how much medicine he had left when he was asked when he went to his next appointment and by now he has ran out of the medicine.  I told him that I was sorry that it happened, but in fact is really not a bad thing since doing a "Drug Holiday" every so often is always good to make sure that the  medicine continues to work appropriately.  He understood and accepted.  However, he indicates that currently he is having more pain in the small of his back, towards the right side and he would like to come in for some injections.  He complains of pain in the lower back with no lower extremity radiation.  The last time we did something for his back was on 11/24/2017 when we did a left-sided lumbar facet radiofrequency ablation #1 preceded on 10/11/2017 by a right-sided lumbar facet radiofrequency ablation #1 under fluoroscopic guidance and IV sedation.  These two radiofrequencies have probably out left third duration.  I will go ahead and bring him in for a diagnostic lumbar facet block.  If he gets good relief that would mean that his radiofrequency has worn off.  Today I informed the patient that we need to update his UDS and I also informed him that by his next refill we will probably be back to regular face-to-face schedule.  Pharmacotherapy Assessment  Analgesic: Oxycodone IR 10 mg 4 times daily MME/day: 44m/day.   Monitoring: [place] PMP: PDMP reviewed during this encounter.       Pharmacotherapy: No side-effects or adverse reactions reported. Compliance: No problems identified. Effectiveness: Clinically acceptable. Plan: Refer to "POC".  UDS:  Summary  Date Value Ref Range Status  05/04/2018 FINAL  Final    Comment:    ==================================================================== TOXASSURE SELECT 13 (MW) ==================================================================== Test                             Result       Flag       Units Drug Present and Declared for Prescription Verification   Oxycodone                      594          EXPECTED   ng/mg creat   Oxymorphone                    523          EXPECTED   ng/mg creat   Noroxycodone                   726          EXPECTED   ng/mg creat   Noroxymorphone                 141          EXPECTED   ng/mg creat    Sources of  oxycodone are scheduled prescription medications.    Oxymorphone, noroxycodone, and noroxymorphone are expected    metabolites of oxycodone. Oxymorphone is also available as a    scheduled prescription medication. ==================================================================== Test                      Result    Flag   Units      Ref Range   Creatinine              135              mg/dL      >=20 ==================================================================== Declared Medications:  The flagging and interpretation on this report are based on the  following declared medications.  Unexpected results may arise from  inaccuracies in the declared medications.  **Note: The testing scope of this panel includes these medications:  Oxycodone  **Note: The  testing scope of this panel does not include following  reported medications:  Gabapentin  Loratadine  Montelukast (Singulair)  Naloxone (Narcan)  Sildenafil (Viagra)  Testosterone ==================================================================== For clinical consultation, please call [phone]. ====================================================================    Laboratory Chemistry Profile   Renal Lab Results  Component Value Date   BUN 10 06/07/2019   CREATININE 0.76 06/07/2019   BCR 7 (L) 12/27/2016   GFRAA >60 06/07/2019   GFRNONAA >60 06/07/2019     Hepatic Lab Results  Component Value Date   AST 14 (L) 06/07/2019   ALT 17 06/07/2019   ALBUMIN 4.0 06/07/2019   ALKPHOS 50 06/07/2019     Electrolytes Lab Results  Component Value Date   NA 138 06/07/2019   K 4.3 06/07/2019   CL 104 06/07/2019   CALCIUM 9.0 06/07/2019   MG 2.0 06/07/2019     Bone Lab Results  Component Value Date   VD25OH 15.51 (L) 06/07/2019   25OHVITD1 17 (L) 12/27/2016   25OHVITD2 <1.0 12/27/2016   25OHVITD3 17 12/27/2016   TESTOFREE 2.6 (L) 06/08/2017   TESTOSTERONE 116 (L) 06/08/2017     Inflammation (CRP: Acute  Phase) (ESR: Chronic Phase) Lab Results  Component Value Date   CRP 2.7 (H) 06/07/2019   ESRSEDRATE 17 (H) 06/07/2019       Note: Above Lab results reviewed.  Imaging  DG C-Arm 1-60 Min-No Report Fluoroscopy was utilized by the requesting physician.  No radiographic  interpretation.   Assessment  The primary encounter diagnosis was Chronic pain syndrome. 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  Plan of Care  Problem-specific:  No problem-specific Assessment & Plan notes found for this encounter.  Mr. Chozen Latulippe. has a current medication list which includes the following long-term medication(s): gnp calcium 1200, vitamin d3, loratadine, magnesium, montelukast, naloxone, metformin, oxycodone hcl, [START ON 12/12/2019] oxycodone hcl, and [START ON 01/11/2020] oxycodone hcl.  Pharmacotherapy (Medications Ordered): Meds ordered this encounter  Medications  . Oxycodone HCl 10 MG TABS    Sig: Take 1 tablet (10 mg total) by mouth every 6 (six) hours as needed. Must last 30 days    Dispense:  120 tablet    Refill:  0    Chronic Pain: STOP Act (Not applicable) Fill 1 day early if closed on refill date. Do not fill until: 11/12/2019. To last until: 12/12/2019. Avoid benzodiazepines within 8 hours of opioids  . Oxycodone HCl 10 MG TABS    Sig: Take 1 tablet (10 mg total) by mouth every 6 (six) hours as needed. Must last 30 days    Dispense:  120 tablet    Refill:  0    Chronic Pain: STOP Act (Not applicable) Fill 1 day early if closed on refill date. Do not fill until: 12/12/2019. To last until: 01/11/2020. Avoid benzodiazepines within 8 hours of opioids  . Oxycodone HCl 10 MG TABS    Sig: Take 1 tablet (10 mg total) by mouth every 6 (six) hours  as needed. Must last 30 days    Dispense:  120 tablet    Refill:  0    Chronic Pain: STOP Act (Not applicable) Fill 1 day early if closed on refill date. Do not fill until: 12/12/2019. To last until: 02/10/2020. Avoid benzodiazepines within 8 hours of opioids  Orders:  Orders Placed This Encounter  Procedures  . LUMBAR FACET(MEDIAL BRANCH NERVE BLOCK) MBNB    Standing Status:   Future    Standing Expiration Date:   12/12/2019    Scheduling Instructions:     Procedure: Lumbar facet block (AKA.: Lumbosacral medial branch nerve block)     Side: Bilateral     Level: L3-4, L4-5, & L5-S1 Facets (L2, L3, L4, L5, & S1 Medial Branch Nerves)     Sedation: Patient's choice.     Timeframe: ASAA    Order Specific Question:   Where will this procedure be performed?    Answer:   [facility]  . ToxASSURE Select 13 (MW), Urine    Volume: 30 ml(s). Minimum 3 ml of urine is needed. Document temperature of fresh sample. Indications: Long term (current) use of opiate analgesic (E72.094)    Order Specific Question:   Release to patient    Answer:   Immediate   Follow-up plan:   Return in about 3 months (around 02/06/2020) for (F2F), (MM), in addition, Procedure (w/ sedation): (R) vs (B) L-FCT Blk, (ASAP).      Considering:   Diagnostic left SI joint block Possible left SI joint RFA Diagnostic caudal ESI + diagnostic epidurogram Possible Racz procedure Diagnostic left IA knee injection (w/ steroid) Possible left IA Hyalgan knee injections Diagnostic left Genicular NB Possible left Genicular nerve RFA   Palliative PRN treatment(s):   Palliative bilateral lumbar facet block  Palliative right SI joint block  Palliative right lumbar facet + sacroiliac joint RFA #2(last one done on 10/11/2017) Palliative left lumbar facet RFA #2(last one done on 11/24/2017)  Diagnostic right L4 TFES2 #1under fluoro and IV sedation.      Recent Visits No visits were found meeting these conditions.   Showing recent visits within past 90 days and meeting all other requirements   Future Appointments No visits were found meeting these conditions.  Showing future appointments within next 90 days and meeting all other requirements   I discussed the assessment and treatment plan with the patient. The patient was provided an opportunity to ask questions and all were answered. The patient agreed with the plan and demonstrated an understanding of the instructions.  Patient advised to call back or seek an in-person evaluation if the symptoms or condition worsens.  Duration of encounter: 15 minutes.  Note by: Gaspar Cola, MD Date: 11/12/2019; Time: 1:06 PM

## 2019-11-12 ENCOUNTER — Other Ambulatory Visit: Payer: Self-pay

## 2019-11-12 ENCOUNTER — Ambulatory Visit: Payer: BLUE CROSS/BLUE SHIELD | Attending: Pain Medicine | Admitting: Pain Medicine

## 2019-11-12 DIAGNOSIS — M5441 Lumbago with sciatica, right side: Secondary | ICD-10-CM

## 2019-11-12 DIAGNOSIS — M961 Postlaminectomy syndrome, not elsewhere classified: Secondary | ICD-10-CM

## 2019-11-12 DIAGNOSIS — M5442 Lumbago with sciatica, left side: Secondary | ICD-10-CM

## 2019-11-12 DIAGNOSIS — M25562 Pain in left knee: Secondary | ICD-10-CM | POA: Diagnosis not present

## 2019-11-12 DIAGNOSIS — M47816 Spondylosis without myelopathy or radiculopathy, lumbar region: Secondary | ICD-10-CM

## 2019-11-12 DIAGNOSIS — G894 Chronic pain syndrome: Secondary | ICD-10-CM

## 2019-11-12 DIAGNOSIS — Z79899 Other long term (current) drug therapy: Secondary | ICD-10-CM

## 2019-11-12 DIAGNOSIS — G8929 Other chronic pain: Secondary | ICD-10-CM

## 2019-11-12 MED ORDER — OXYCODONE HCL 10 MG PO TABS: 10.0000 mg | ORAL_TABLET | Freq: Four times a day (QID) | ORAL | 0 refills | Status: DC | PRN

## 2019-11-12 NOTE — Patient Instructions (Signed)
____________________________________________________________________________________________  Preparing for Procedure with Sedation  Procedure appointments are limited to planned procedures: . No Prescription Refills. . No disability issues will be discussed. . No medication changes will be discussed.  Instructions: . Oral Intake: Do not eat or drink anything for at least 3 hours prior to your procedure. (Exception: Blood Pressure Medication. See below.) . Transportation: Unless otherwise stated by your physician, you may drive yourself after the procedure. . Blood Pressure Medicine: Do not forget to take your blood pressure medicine with a sip of water the morning of the procedure. If your Diastolic (lower reading)is above 100 mmHg, elective cases will be cancelled/rescheduled. . Blood thinners: These will need to be stopped for procedures. Notify our staff if you are taking any blood thinners. Depending on which one you take, there will be specific instructions on how and when to stop it. . Diabetics on insulin: Notify the staff so that you can be scheduled 1st case in the morning. If your diabetes requires high dose insulin, take only  of your normal insulin dose the morning of the procedure and notify the staff that you have done so. . Preventing infections: Shower with an antibacterial soap the morning of your procedure. . Build-up your immune system: Take 1000 mg of Vitamin C with every meal (3 times a day) the day prior to your procedure. . Antibiotics: Inform the staff if you have a condition or reason that requires you to take antibiotics before dental procedures. . Pregnancy: If you are pregnant, call and cancel the procedure. . Sickness: If you have a cold, fever, or any active infections, call and cancel the procedure. . Arrival: You must be in the facility at least 30 minutes prior to your scheduled procedure. . Children: Do not bring children with you. . Dress appropriately:  Bring dark clothing that you would not mind if they get stained. . Valuables: Do not bring any jewelry or valuables.  Reasons to call and reschedule or cancel your procedure: (Following these recommendations will minimize the risk of a serious complication.) . Surgeries: Avoid having procedures within 2 weeks of any surgery. (Avoid for 2 weeks before or after any surgery). . Flu Shots: Avoid having procedures within 2 weeks of a flu shots or . (Avoid for 2 weeks before or after immunizations). . Barium: Avoid having a procedure within 7-10 days after having had a radiological study involving the use of radiological contrast. (Myelograms, Barium swallow or enema study). . Heart attacks: Avoid any elective procedures or surgeries for the initial 6 months after a "Myocardial Infarction" (Heart Attack). . Blood thinners: It is imperative that you stop these medications before procedures. Let us know if you if you take any blood thinner.  . Infection: Avoid procedures during or within two weeks of an infection (including chest colds or gastrointestinal problems). Symptoms associated with infections include: Localized redness, fever, chills, night sweats or profuse sweating, burning sensation when voiding, cough, congestion, stuffiness, runny nose, sore throat, diarrhea, nausea, vomiting, cold or Flu symptoms, recent or current infections. It is specially important if the infection is over the area that we intend to treat. . Heart and lung problems: Symptoms that may suggest an active cardiopulmonary problem include: cough, chest pain, breathing difficulties or shortness of breath, dizziness, ankle swelling, uncontrolled high or unusually low blood pressure, and/or palpitations. If you are experiencing any of these symptoms, cancel your procedure and contact your primary care physician for an evaluation.  Remember:  Regular Business hours are:    Monday to Thursday 8:00 AM to 4:00 PM  Provider's  Schedule: Blossie Raffel, MD:  Procedure days: Tuesday and Thursday 7:30 AM to 4:00 PM  Bilal Lateef, MD:  Procedure days: Monday and Wednesday 7:30 AM to 4:00 PM ____________________________________________________________________________________________    

## 2019-11-19 LAB — TOXASSURE SELECT 13 (MW), URINE

## 2020-02-05 ENCOUNTER — Ambulatory Visit: Payer: BLUE CROSS/BLUE SHIELD | Attending: Pain Medicine | Admitting: Pain Medicine

## 2020-02-05 ENCOUNTER — Encounter: Payer: Self-pay | Admitting: Pain Medicine

## 2020-02-05 ENCOUNTER — Other Ambulatory Visit: Payer: Self-pay

## 2020-02-05 VITALS — BP 129/91 | HR 69 | Temp 97.6°F | Resp 16 | Ht 71.0 in | Wt 264.0 lb

## 2020-02-05 DIAGNOSIS — M25552 Pain in left hip: Secondary | ICD-10-CM

## 2020-02-05 DIAGNOSIS — M5442 Lumbago with sciatica, left side: Secondary | ICD-10-CM | POA: Diagnosis not present

## 2020-02-05 DIAGNOSIS — M5136 Other intervertebral disc degeneration, lumbar region: Secondary | ICD-10-CM | POA: Diagnosis present

## 2020-02-05 DIAGNOSIS — M7062 Trochanteric bursitis, left hip: Secondary | ICD-10-CM

## 2020-02-05 DIAGNOSIS — Z79899 Other long term (current) drug therapy: Secondary | ICD-10-CM

## 2020-02-05 DIAGNOSIS — M47816 Spondylosis without myelopathy or radiculopathy, lumbar region: Secondary | ICD-10-CM

## 2020-02-05 DIAGNOSIS — G894 Chronic pain syndrome: Secondary | ICD-10-CM

## 2020-02-05 DIAGNOSIS — M5441 Lumbago with sciatica, right side: Secondary | ICD-10-CM

## 2020-02-05 DIAGNOSIS — G8929 Other chronic pain: Secondary | ICD-10-CM | POA: Insufficient documentation

## 2020-02-05 DIAGNOSIS — M961 Postlaminectomy syndrome, not elsewhere classified: Secondary | ICD-10-CM

## 2020-02-05 DIAGNOSIS — M25562 Pain in left knee: Secondary | ICD-10-CM

## 2020-02-05 MED ORDER — OXYCODONE HCL 10 MG PO TABS: 10.0000 mg | ORAL_TABLET | Freq: Four times a day (QID) | ORAL | 0 refills | Status: DC | PRN

## 2020-02-05 NOTE — Progress Notes (Signed)
Nursing Pain Medication Assessment:  Safety precautions to be maintained throughout the outpatient stay will include: orient to surroundings, keep bed in low position, maintain call bell within reach at all times, provide assistance with transfer out of bed and ambulation.  Medication Inspection Compliance: Pill count conducted under aseptic conditions, in front of the patient. Neither the pills nor the bottle was removed from the patient's sight at any time. Once count was completed pills were immediately returned to the patient in their original bottle.  Medication: Oxycodone IR Pill/Patch Count: 20 of 120 pills remain Pill/Patch Appearance: Markings consistent with prescribed medication Bottle Appearance: Standard pharmacy container. Clearly labeled. Filled Date: 01/11/2020 Last Medication intake:  Today

## 2020-02-05 NOTE — Progress Notes (Signed)
PROVIDER NOTE: Information contained herein reflects review and annotations entered in association with encounter. Interpretation of such information and data should be left to medically-trained personnel. Information provided to patient can be located elsewhere in the medical record under "Patient Instructions". Document created using STT-dictation technology, any transcriptional errors that may result from process are unintentional.    Patient: Nathan Lambert.  Service Category: E/M  Provider: Gaspar Cola, MD  DOB: [birth year]  DOS: 02/05/2020  Specialty: Interventional Pain Management  MRN: [id number]  Setting: Ambulatory outpatient  PCP: Vidal Schwalbe, MD  Type: Established Patient    Referring Provider: Vidal Schwalbe, MD  Location: Office  Delivery: Face-to-face     HPI  Reason for encounter: [name], a 39 y.o. year old male, is here today for evaluation and management of his Chronic pain syndrome [G89.4]. Mr. Puertas primary complain today is Hip Pain (left) Last encounter: Practice (Visit date not found). My last encounter with him was on Visit date not found. Pertinent problems: Mr. 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 Pain Assessment: Severity of Chronic pain is reported as a 6 /10. Location: Hip Left/left upper leg. Onset: More than a month ago. Quality: Other (Comment) (stiff). Timing: Constant. Modifying factor(s): lying on left side. Vitals:  height is 5' 11" (1.803 m) and weight is 264 lb (119.7 kg). His temporal temperature is 97.6 F (36.4 C). His blood pressure is 129/91 (abnormal) and his pulse is 69. His respiration is 16 and oxygen saturation is 99%.    The patient indicates doing well with the current medication regimen. No adverse reactions or side effects reported to the medications.  Today the patient comes in indicating that he is having more pain in the area of the left hip.  Physical exam today was positive for pain in the area of the left trochanteric bursa.  Negative Patrick maneuver for SI joint or hip joint pain on the left side.  Hyperextension and rotation was positive for ipsilateral low back pain on the left side with identical reproduction of the pain that he was describing.  In view of this, I have entered today in order for a left lumbar facet block, as soon as possible.  Pharmacotherapy Assessment   Analgesic: Oxycodone IR 10 mg 4 times daily MME/day: 64m/day.   Monitoring:  PMP: PDMP reviewed during this encounter.       Pharmacotherapy: No side-effects or adverse reactions reported. Compliance: No problems identified. Effectiveness: Clinically acceptable.  WLandis Martins RN  02/05/2020  2:20 PM  Sign when Signing Visit Nursing Pain Medication Assessment:  Safety precautions to be maintained throughout the outpatient stay will include: orient to surroundings, keep bed in low position, maintain call bell within  reach at all  times, provide assistance with transfer out of bed and ambulation.  Medication Inspection Compliance: Pill count conducted under aseptic conditions, in front of the patient. Neither the pills nor the bottle was removed from the patient's sight at any time. Once count was completed pills were immediately returned to the patient in their original bottle.  Medication: Oxycodone IR Pill/Patch Count: 20 of 120 pills remain Pill/Patch Appearance: Markings consistent with prescribed medication Bottle Appearance: Standard pharmacy container. Clearly labeled. Filled Date: 01/11/2020 Last Medication intake:  Today    UDS:  Summary  Date Value Ref Range Status  11/15/2019 Note  Final    Comment:    ==================================================================== [employer] [facility] (MW) ==================================================================== Test                             Result       Flag       Units Drug Present and Declared for Prescription Verification   Oxycodone                      1444         EXPECTED   ng/mg creat   Oxymorphone                    2592         EXPECTED   ng/mg creat   Noroxycodone                   1773         EXPECTED   ng/mg creat   Noroxymorphone                 717          EXPECTED   ng/mg creat    Sources of oxycodone are scheduled prescription medications.    Oxymorphone, noroxycodone, and noroxymorphone are expected    metabolites of oxycodone. Oxymorphone is also available as a    scheduled prescription medication. ==================================================================== Test                      Result    Flag   Units      Ref Range   Creatinine              196              mg/dL      >=20 ==================================================================== Declared Medications:  The flagging and interpretation on this report are based on the  following declared medications.  Unexpected results may arise from   inaccuracies in the declared medications.  **Note: The testing scope of this panel includes these medications:  Oxycodone  **Note: The testing scope of this panel does not include the  following reported medications:  Calcium  Cholecalciferol  Loratadine  Magnesium (Mag-Ox)  Meloxicam  Metformin  Montelukast  Naloxone  Vitamin D ==================================================================== For clinical consultation, please call [phone]. ====================================================================      ROS  Constitutional: Denies any fever or chills Gastrointestinal: No reported hemesis, hematochezia, vomiting, or acute GI distress Musculoskeletal: Denies any acute onset joint swelling, redness, loss of ROM, or weakness Neurological: No reported episodes of acute onset apraxia, aphasia, dysarthria, agnosia, amnesia, paralysis, loss of coordination, or loss of consciousness  Medication Review  GNP Calcium 1200, Magnesium, Oxycodone HCl, Vitamin D3, clobetasol ointment, doxycycline, econazole nitrate, gentamicin cream, loratadine, metFORMIN, montelukast, naloxone, and terbinafine  History Review  Allergy:  Mr. Bonawitz is allergic to bactrim [sulfamethoxazole-trimethoprim]. Drug: Mr. Hinojos  reports no history of drug use. Alcohol:  reports no history of alcohol use. Tobacco:  reports that he has been smoking cigarettes. He has a 10.00 pack-year smoking history. He has never used smokeless tobacco. Social: Mr. Madewell  reports that he has been smoking cigarettes. He has a 10.00 pack-year smoking history. He has never used smokeless tobacco. He reports that he does not drink alcohol and does not use drugs. Medical:  has a past medical history of Allergy (2012), Chronic low back pain (Primary Area of Pain) (Bilateral) (R>L), Degeneration of lumbar or lumbosacral intervertebral disc (L4-L5) (12/27/2016), Headache, and Opiate use (02/15/2017). Surgical: Mr.  Verde  has a past surgical history that includes Fracture surgery (Left); Spine surgery (Right, 06/16/2015); and Back surgery. Family: family history includes AAA (abdominal aortic aneurysm) in his mother; Diabetes in his father; Stroke in his mother.  Laboratory Chemistry Profile   Renal Lab Results  Component Value Date   BUN 10 06/07/2019   CREATININE 0.76 06/07/2019   BCR 7 (L) 12/27/2016   GFRAA >60 06/07/2019   GFRNONAA >60 06/07/2019     Hepatic Lab Results  Component Value Date   AST 14 (L) 06/07/2019   ALT 17 06/07/2019   ALBUMIN 4.0 06/07/2019   ALKPHOS 50 06/07/2019     Electrolytes Lab Results  Component Value Date   NA 138 06/07/2019   K 4.3 06/07/2019   CL 104 06/07/2019   CALCIUM 9.0 06/07/2019   MG 2.0 06/07/2019     Bone Lab Results  Component Value Date   VD25OH 15.51 (L) 06/07/2019   25OHVITD1 17 (L) 12/27/2016   25OHVITD2 <1.0 12/27/2016   25OHVITD3 17 12/27/2016   TESTOFREE 2.6 (L) 06/08/2017   TESTOSTERONE 116 (L) 06/08/2017     Inflammation (CRP: Acute Phase) (ESR: Chronic Phase) Lab Results  Component Value Date   CRP 2.7 (H) 06/07/2019   ESRSEDRATE 17 (H) 06/07/2019       Note: Above Lab results reviewed.  Recent Imaging Review  DG C-Arm 1-60 Min-No Report Fluoroscopy was utilized by the requesting physician.  No radiographic  interpretation.  Note: Reviewed        Physical Exam  General appearance: Well nourished, well developed, and well hydrated. In no apparent acute distress Mental status: Alert, oriented x 3 (person, place, & time)       Respiratory: No evidence of acute respiratory distress Eyes: PERLA Vitals: BP (!) 129/91   Pulse 69   Temp 97.6 F (36.4 C) (Temporal)   Resp 16   Ht 5' 11" (1.803 m)   Wt 264 lb (119.7 kg)   SpO2 99%   BMI 36.82 kg/m  BMI: Estimated body mass index is 36.82 kg/m as calculated from the following:   Height as of this encounter: 5' 11" (1.803 m).   Weight as of this  encounter: 264 lb (119.7 kg). Ideal: Ideal body weight: 75.3 kg (166 lb 0.1 oz) Adjusted ideal body weight: 93.1 kg (205 lb 3.3 oz)  Assessment   Status Diagnosis  Controlled Controlled Controlled 1. Chronic pain syndrome   2. Chronic low back pain (Primary Area of Pain) (Bilateral) (R>L)   3. Lumbar facet syndrome (Bilateral) (R>L)   4. Left hip pain   5. Greater trochanteric bursitis (Left)   6. Chronic knee pain (Secondary Area of pain) (Left)   7. 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 DDD (degenerative disc disease),  lumbar   9. Pharmacologic therapy      Updated Problems: Problem  Left Hip Pain  Greater trochanteric bursitis (Left)    Plan of Care  Problem-specific:  No problem-specific Assessment & Plan notes found for this encounter.  Mr. Dawan Farney. has a current medication list which includes the following long-term medication(s): gnp calcium 1200, vitamin d3, loratadine, magnesium, metformin, montelukast, naloxone, [START ON 02/10/2020] oxycodone hcl, [START ON 03/11/2020] oxycodone hcl, and [START ON 04/10/2020] oxycodone hcl.  Pharmacotherapy (Medications Ordered): Meds ordered this encounter  Medications  . Oxycodone HCl 10 MG TABS    Sig: Take 1 tablet (10 mg total) by mouth every 6 (six) hours as needed. Must last 30 days    Dispense:  120 tablet    Refill:  0    Chronic Pain: STOP Act (Not applicable) Fill 1 day early if closed on refill date. Do not fill until: 02/10/2020. To last until: 03/11/2020. Avoid benzodiazepines within 8 hours of opioids  . Oxycodone HCl 10 MG TABS    Sig: Take 1 tablet (10 mg total) by mouth every 6 (six) hours as needed. Must last 30 days    Dispense:  120 tablet    Refill:  0    Chronic Pain: STOP Act (Not applicable) Fill 1 day early if closed on refill date. Do not fill until: 03/11/2020. To last until: 04/10/2020. Avoid benzodiazepines within 8 hours of opioids  . Oxycodone HCl 10 MG TABS    Sig:  Take 1 tablet (10 mg total) by mouth every 6 (six) hours as needed. Must last 30 days    Dispense:  120 tablet    Refill:  0    Chronic Pain: STOP Act (Not applicable) Fill 1 day early if closed on refill date. Do not fill until: 04/10/2020. To last until: 05/10/2020. Avoid benzodiazepines within 8 hours of opioids   Orders:  Orders Placed This Encounter  Procedures  . LUMBAR FACET(MEDIAL BRANCH NERVE BLOCK) MBNB    Standing Status:   Future    Standing Expiration Date:   03/07/2020    Scheduling Instructions:     Procedure: Lumbar facet block (AKA.: Lumbosacral medial branch nerve block)     Side: Left-sided     Level: L3-4, L4-5, & L5-S1 Facets (L2, L3, L4, L5, & S1 Medial Branch Nerves)     Sedation: With Sedation.     Timeframe: ASAA    Order Specific Question:   Where will this procedure be performed?    Answer:   [facility]   Follow-up plan:   Return for Procedure (w/ sedation): (L) L-FCT BLK.      Considering:   Diagnostic left SI joint block Possible left SI joint RFA Diagnostic caudal ESI + diagnostic epidurogram Possible Racz procedure Diagnostic left IA knee injection (w/ steroid) Possible left IA Hyalgan knee injections Diagnostic left Genicular NB Possible left Genicular nerve RFA   Palliative PRN treatment(s):   Palliative bilateral lumbar facet block  Palliative right SI joint block  Palliative right lumbar facet + sacroiliac joint RFA #2(last one done on 10/11/2017) Palliative left lumbar facet RFA #2(last one done on 11/24/2017)  Diagnostic right L4 TFES2 #1under fluoro and IV sedation.    Recent Visits No visits were found meeting these conditions. Showing recent visits within past 90 days and meeting all other requirements Today's Visits Date Type Provider Dept  02/05/20 Office Visit Milinda Pointer, MD [facility]  Showing today's visits and meeting  all other requirements Future Appointments No visits were found meeting  these conditions. Showing future appointments within next 90 days and meeting all other requirements  I discussed the assessment and treatment plan with the patient. The patient was provided an opportunity to ask questions and all were answered. The patient agreed with the plan and demonstrated an understanding of the instructions.  Patient advised to call back or seek an in-person evaluation if the symptoms or condition worsens.  Duration of encounter: 30 minutes.  Note by: Gaspar Cola, MD Date: 02/05/2020; Time: 2:48 PM

## 2020-02-05 NOTE — Patient Instructions (Addendum)
____________________________________________________________________________________________  Drug Holidays (Slow)  What is a "Drug Holiday"? Drug Holiday: is the name given to the period of time during which a patient stops taking a medication(s) for the purpose of eliminating tolerance to the drug.  Benefits . Improved effectiveness of opioids. . Decreased opioid dose needed to achieve benefits. . Improved pain with lesser dose.  What is tolerance? Tolerance: is the progressive decreased in effectiveness of a drug due to its repetitive use. With repetitive use, the body gets use to the medication and as a consequence, it loses its effectiveness. This is a common problem seen with opioid pain medications. As a result, a larger dose of the drug is needed to achieve the same effect that used to be obtained with a smaller dose.  How long should a "Drug Holiday" last? You should stay off of the pain medicine for at least 14 consecutive days. (2 weeks)  Should I stop the medicine "cold turkey"? No. You should always coordinate with your Pain Specialist so that he/she can provide you with the correct medication dose to make the transition as smoothly as possible.  How do I stop the medicine? Slowly. You will be instructed to decrease the daily amount of pills that you take by one (1) pill every seven (7) days. This is called a "slow downward taper" of your dose. For example: if you normally take four (4) pills per day, you will be asked to drop this dose to three (3) pills per day for seven (7) days, then to two (2) pills per day for seven (7) days, then to one (1) per day for seven (7) days, and at the end of those last seven (7) days, this is when the "Drug Holiday" would start.   Will I have withdrawals? By doing a "slow downward taper" like this one, it is unlikely that you will experience any significant withdrawal symptoms. Typically, what triggers withdrawals is the sudden stop of a high  dose opioid therapy. Withdrawals can usually be avoided by slowly decreasing the dose over a prolonged period of time.  What are withdrawals? Withdrawals: refers to the wide range of symptoms that occur after stopping or dramatically reducing opiate drugs after heavy and prolonged use. Withdrawal symptoms do not occur to patients that use low dose opioids, or those who take the medication sporadically. Contrary to benzodiazepine (example: Valium, Xanax, etc.) or alcohol withdrawals ("Delirium Tremens"), opioid withdrawals are not lethal. Withdrawals are the physical manifestation of the body getting rid of the excess receptors.  Expected Symptoms Early symptoms of withdrawal may include: . Agitation . Anxiety . Muscle aches . Increased tearing . Insomnia . Runny nose . Sweating . Yawning  Late symptoms of withdrawal may include: . Abdominal cramping . Diarrhea . Dilated pupils . Goose bumps . Nausea . Vomiting  Will I experience withdrawals? Due to the slow nature of the taper, it is very unlikely that you will experience any.  What is a slow taper? Taper: refers to the gradual decrease in dose.  ___________________________________________________________________________________________    ____________________________________________________________________________________________  Medication Rules  Purpose: To inform patients, and their family members, of our rules and regulations.  Applies to: All patients receiving prescriptions (written or electronic).  Pharmacy of record: Pharmacy where electronic prescriptions will be sent. If written prescriptions are taken to a different pharmacy, please inform the nursing staff. The pharmacy listed in the electronic medical record should be the one where you would like electronic prescriptions to be sent.  Electronic   prescriptions: In compliance with the [place] Strengthen Opioid Misuse Prevention (STOP) Act of 2017 (Session  Law 2017-74/H243), effective July 19, 2018, all controlled substances must be electronically prescribed. Calling prescriptions to the pharmacy will cease to exist.  Prescription refills: Only during scheduled appointments. Applies to all prescriptions.  NOTE: The following applies primarily to controlled substances (Opioid* Pain Medications).   Type of encounter (visit): For patients receiving controlled substances, face-to-face visits are required. (Not an option or up to the patient.)  Patient's responsibilities: 1. Pain Pills: Bring all pain pills to every appointment (except for procedure appointments). 2. Pill Bottles: Bring pills in original pharmacy bottle. Always bring the newest bottle. Bring bottle, even if empty. 3. Medication refills: You are responsible for knowing and keeping track of what medications you take and those you need refilled. The day before your appointment: write a list of all prescriptions that need to be refilled. The day of the appointment: give the list to the admitting nurse. Prescriptions will be written only during appointments. No prescriptions will be written on procedure days. If you forget a medication: it will not be "Called in", "Faxed", or "electronically sent". You will need to get another appointment to get these prescribed. No early refills. Do not call asking to have your prescription filled early. 4. Prescription Accuracy: You are responsible for carefully inspecting your prescriptions before leaving our office. Have the discharge nurse carefully go over each prescription with you, before taking them home. Make sure that your name is accurately spelled, that your address is correct. Check the name and dose of your medication to make sure it is accurate. Check the number of pills, and the written instructions to make sure they are clear and accurate. Make sure that you are given enough medication to last until your next medication refill  appointment. 5. Taking Medication: Take medication as prescribed. When it comes to controlled substances, taking less pills or less frequently than prescribed is permitted and encouraged. Never take more pills than instructed. Never take medication more frequently than prescribed.  6. Inform other Doctors: Always inform, all of your healthcare providers, of all the medications you take. 7. Pain Medication from other Providers: You are not allowed to accept any additional pain medication from any other Doctor or Healthcare provider. There are two exceptions to this rule. (see below) In the event that you require additional pain medication, you are responsible for notifying us, as stated below. 8. Medication Agreement: You are responsible for carefully reading and following our Medication Agreement. This must be signed before receiving any prescriptions from our practice. Safely store a copy of your signed Agreement. Violations to the Agreement will result in no further prescriptions. (Additional copies of our Medication Agreement are available upon request.) 9. Laws, Rules, & Regulations: All patients are expected to follow all Federal and State Laws, Statutes, Rules, & Regulations. Ignorance of the Laws does not constitute a valid excuse.  10. Illegal drugs and Controlled Substances: The use of illegal substances (including, but not limited to marijuana and its derivatives) and/or the illegal use of any controlled substances is strictly prohibited. Violation of this rule may result in the immediate and permanent discontinuation of any and all prescriptions being written by our practice. The use of any illegal substances is prohibited. 11. Adopted CDC guidelines & recommendations: Target dosing levels will be at or below 60 MME/day. Use of benzodiazepines** is not recommended.  Exceptions: There are only two exceptions to the rule of not   receiving pain medications from other Healthcare  Providers. 1. Exception #1 (Emergencies): In the event of an emergency (i.e.: accident requiring emergency care), you are allowed to receive additional pain medication. However, you are responsible for: As soon as you are able, call our office [phone], at any time of the day or night, and leave a message stating your name, the date and nature of the emergency, and the name and dose of the medication prescribed. In the event that your call is answered by a member of our staff, make sure to document and save the date, time, and the name of the person that took your information.  2. Exception #2 (Planned Surgery): In the event that you are scheduled by another doctor or dentist to have any type of surgery or procedure, you are allowed (for a period no longer than 30 days), to receive additional pain medication, for the acute post-op pain. However, in this case, you are responsible for picking up a copy of our "Post-op Pain Management for Surgeons" handout, and giving it to your surgeon or dentist. This document is available at our office, and does not require an appointment to obtain it. Simply go to our office during business hours (Monday-Thursday from 8:00 AM to 4:00 PM) (Friday 8:00 AM to 12:00 Noon) or if you have a scheduled appointment with us, prior to your surgery, and ask for it by name. In addition, you will need to provide us with your name, name of your surgeon, type of surgery, and date of procedure or surgery.  *Opioid medications include: morphine, codeine, oxycodone, oxymorphone, hydrocodone, hydromorphone, meperidine, tramadol, tapentadol, buprenorphine, fentanyl, methadone. **Benzodiazepine medications include: diazepam (Valium), alprazolam (Xanax), clonazepam (Klonopine), lorazepam (Ativan), clorazepate (Tranxene), chlordiazepoxide (Librium), estazolam (Prosom), oxazepam (Serax), temazepam (Restoril), triazolam (Halcion) (Last updated:  09/15/2017) ____________________________________________________________________________________________   ____________________________________________________________________________________________  Medication Recommendations and Reminders  Applies to: All patients receiving prescriptions (written and/or electronic).  Medication Rules & Regulations: These rules and regulations exist for your safety and that of others. They are not flexible and neither are we. Dismissing or ignoring them will be considered "non-compliance" with medication therapy, resulting in complete and irreversible termination of such therapy. (See document titled "Medication Rules" for more details.) In all conscience, because of safety reasons, we cannot continue providing a therapy where the patient does not follow instructions.  Pharmacy of record:   Definition: This is the pharmacy where your electronic prescriptions will be sent.   We do not endorse any particular pharmacy.  You are not restricted in your choice of pharmacy.  The pharmacy listed in the electronic medical record should be the one where you want electronic prescriptions to be sent.  If you choose to change pharmacy, simply notify our nursing staff of your choice of new pharmacy.  Recommendations:  Keep all of your pain medications in a safe place, under lock and key, even if you live alone.   After you fill your prescription, take 1 week's worth of pills and put them away in a safe place. You should keep a separate, properly labeled bottle for this purpose. The remainder should be kept in the original bottle. Use this as your primary supply, until it runs out. Once it's gone, then you know that you have 1 week's worth of medicine, and it is time to come in for a prescription refill. If you do this correctly, it is unlikely that you will ever run out of medicine.  To make sure that the above recommendation works,   it is very important that you  make sure your medication refill appointments are scheduled at least 1 week before you run out of medicine. To do this in an effective manner, make sure that you do not leave the office without scheduling your next medication management appointment. Always ask the nursing staff to show you in your prescription , when your medication will be running out. Then arrange for the receptionist to get you a return appointment, at least 7 days before you run out of medicine. Do not wait until you have 1 or 2 pills left, to come in. This is very poor planning and does not take into consideration that we may need to cancel appointments due to bad weather, sickness, or emergencies affecting our staff.  "Partial Fill": If for any reason your pharmacy does not have enough pills/tablets to completely fill or refill your prescription, do not allow for a "partial fill". You will need a separate prescription to fill the remaining amount, which we will not provide. If the reason for the partial fill is your insurance, you will need to talk to the pharmacist about payment alternatives for the remaining tablets, but again, do not accept a partial fill.  Prescription refills and/or changes in medication(s):   Prescription refills, and/or changes in dose or medication, will be conducted only during scheduled medication management appointments. (Applies to both, written and electronic prescriptions.)  No refills on procedure days. No medication will be changed or started on procedure days. No changes, adjustments, and/or refills will be conducted on a procedure day. Doing so will interfere with the diagnostic portion of the procedure.  No phone refills. No medications will be "called into the pharmacy".  No Fax refills.  No weekend refills.  No Holliday refills.  No after hours refills.  Remember:  Business hours are:  Monday to Thursday 8:00 AM to 4:00 PM Provider's Schedule: Joslyn Ramos, MD - Appointments  are:  Medication management: Monday and Wednesday 8:00 AM to 4:00 PM Procedure day: Tuesday and Thursday 7:30 AM to 4:00 PM Bilal Lateef, MD - Appointments are:  Medication management: Tuesday and Thursday 8:00 AM to 4:00 PM Procedure day: Monday and Wednesday 7:30 AM to 4:00 PM (Last update: 09/15/2017) ____________________________________________________________________________________________   ____________________________________________________________________________________________  CANNABIDIOL (AKA: CBD Oil or Pills)  Applies to: All patients receiving prescriptions of controlled substances (written and/or electronic).  General Information: Cannabidiol (CBD) was discovered in 1940. It is one of some 113 identified cannabinoids in cannabis (Marijuana) plants, accounting for up to 40% of the plant's extract. As of 2018, preliminary clinical research on cannabidiol included studies of anxiety, cognition, movement disorders, and pain.  Cannabidiol is consummed in multiple ways, including inhalation of cannabis smoke or vapor, as an aerosol spray into the cheek, and by mouth. It may be supplied as CBD oil containing CBD as the active ingredient (no added tetrahydrocannabinol (THC) or terpenes), a full-plant CBD-dominant hemp extract oil, capsules, dried cannabis, or as a liquid solution. CBD is thought not have the same psychoactivity as THC, and may affect the actions of THC. Studies suggest that CBD may interact with different biological targets, including cannabinoid receptors and other neurotransmitter receptors. As of 2018 the mechanism of action for its biological effects has not been determined.  In the [place], cannabidiol has a limited approval by the Food and Drug Administration (FDA) for treatment of only two types of epilepsy disorders. The side effects of long-term use of the drug include somnolence, decreased appetite, diarrhea,   fatigue, malaise, weakness, sleeping  problems, and others.  CBD remains a Schedule I drug prohibited for any use.  Legality: Some manufacturers ship CBD products nationally, an illegal action which the FDA has not enforced in 2018, with CBD remaining the subject of an FDA investigational new drug evaluation, and is not considered legal as a dietary supplement or food ingredient as of December 2018. Federal illegality has made it difficult historically to conduct research on CBD. CBD is openly sold in head shops and health food stores in some states where such sales have not been explicitly legalized.  Warning: Because it is not FDA approved for general use or treatment of pain, it is not required to undergo the same manufacturing controls as prescription drugs.  This means that the available cannabidiol (CBD) may be contaminated with THC.  If this is the case, it will trigger a positive urine drug screen (UDS) test for cannabinoids (Marijuana).  Because a positive UDS for illicit substances is a violation of our medication agreement, your opioid analgesics (pain medicine) may be permanently discontinued. (Last update: 10/06/2017) ____________________________________________________________________________________________   ____________________________________________________________________________________________  Preparing for Procedure with Sedation  Procedure appointments are limited to planned procedures: . No Prescription Refills. . No disability issues will be discussed. . No medication changes will be discussed.  Instructions: . Oral Intake: Do not eat or drink anything for at least 8 hours prior to your procedure. (Exception: Blood Pressure Medication. See below.) . Transportation: Unless otherwise stated by your physician, you may drive yourself after the procedure. . Blood Pressure Medicine: Do not forget to take your blood pressure medicine with a sip of water the morning of the procedure. If your Diastolic (lower  reading)is above 100 mmHg, elective cases will be cancelled/rescheduled. . Blood thinners: These will need to be stopped for procedures. Notify our staff if you are taking any blood thinners. Depending on which one you take, there will be specific instructions on how and when to stop it. . Diabetics on insulin: Notify the staff so that you can be scheduled 1st case in the morning. If your diabetes requires high dose insulin, take only  of your normal insulin dose the morning of the procedure and notify the staff that you have done so. . Preventing infections: Shower with an antibacterial soap the morning of your procedure. . Build-up your immune system: Take 1000 mg of Vitamin C with every meal (3 times a day) the day prior to your procedure. Marland Kitchen Antibiotics: Inform the staff if you have a condition or reason that requires you to take antibiotics before dental procedures. . Pregnancy: If you are pregnant, call and cancel the procedure. . Sickness: If you have a cold, fever, or any active infections, call and cancel the procedure. . Arrival: You must be in the facility at least 30 minutes prior to your scheduled procedure. . Children: Do not bring children with you. . Dress appropriately: Bring dark clothing that you would not mind if they get stained. . Valuables: Do not bring any jewelry or valuables.  Reasons to call and reschedule or cancel your procedure: (Following these recommendations will minimize the risk of a serious complication.) . Surgeries: Avoid having procedures within 2 weeks of any surgery. (Avoid for 2 weeks before or after any surgery). . Flu Shots: Avoid having procedures within 2 weeks of a flu shots or . (Avoid for 2 weeks before or after immunizations). . Barium: Avoid having a procedure within 7-10 days after having had a radiological  study involving the use of radiological contrast. (Myelograms, Barium swallow or enema study). . Heart attacks: Avoid any elective procedures  or surgeries for the initial 6 months after a "Myocardial Infarction" (Heart Attack). . Blood thinners: It is imperative that you stop these medications before procedures. Let us know if you if you take any blood thinner.  . Infection: Avoid procedures during or within two weeks of an infection (including chest colds or gastrointestinal problems). Symptoms associated with infections include: Localized redness, fever, chills, night sweats or profuse sweating, burning sensation when voiding, cough, congestion, stuffiness, runny nose, sore throat, diarrhea, nausea, vomiting, cold or Flu symptoms, recent or current infections. It is specially important if the infection is over the area that we intend to treat. Marland Kitchen Heart and lung problems: Symptoms that may suggest an active cardiopulmonary problem include: cough, chest pain, breathing difficulties or shortness of breath, dizziness, ankle swelling, uncontrolled high or unusually low blood pressure, and/or palpitations. If you are experiencing any of these symptoms, cancel your procedure and contact your primary care physician for an evaluation.  Remember:  Regular Business hours are:  Monday to Thursday 8:00 AM to 4:00 PM  Provider's Schedule: Delano Metz, MD:  Procedure days: Tuesday and Thursday 7:30 AM to 4:00 PM  Edward Jolly, MD:  Procedure days: Monday and Wednesday 7:30 AM to 4:00 PM ____________________________________________________________________________________________ Three prescriptions for Oxycodone were sent to your pharmacy.

## 2020-05-06 NOTE — Progress Notes (Signed)
PROVIDER NOTE: Information contained herein reflects review and annotations entered in association with encounter. Interpretation of such information and data should be left to medically-trained personnel. Information provided to patient can be located elsewhere in the medical record under "Patient Instructions". Document created using STT-dictation technology, any transcriptional errors that may result from process are unintentional.    Patient: Nathan Lambert.  Service Category: E/M  Provider: Gaspar Cola, MD  DOB: 1980/09/15  DOS: 05/07/2020  Specialty: Interventional Pain Management  MRN: [id number]  Setting: Ambulatory outpatient  PCP: Vidal Schwalbe, MD  Type: Established Patient    Referring Provider: Vidal Schwalbe, MD  Location: Office  Delivery: Face-to-face     HPI  [name], a 39 y.o. year old male, is here today because of his Chronic pain syndrome [G89.4]. [name] primary complain today is Back Pain (low) Last encounter: My last encounter with him was on 02/05/2020. Pertinent problems: 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); Chronic lower extremity pain (Bilateral) (L>R); Lumbar facet hypertrophy (Bilateral); Lumbar facet syndrome (Bilateral) (R>L); Neurogenic pain; Spondylosis without myelopathy or radiculopathy, lumbar region; Other specified dorsopathies, sacral and sacrococcygeal region; Lumbar spondylosis; Chronic upper extremity pain (Left); Pain and numbness of left upper extremity; Cervical radiculitis (C6/C7) (Left); DDD  (degenerative disc disease), cervical; Cervicalgia; Osteoarthritis involving multiple joints; Left hip pain; Greater trochanteric bursitis (Left); Lumbar back pain with radiculopathy affecting left lower extremity (L5 dermatomal distribution); and Subacute lumbar radiculopathy (L5) (Left) on their pertinent problem list. Pain Assessment: Severity of Chronic pain is reported as a 3 /10. Location: Back Lower/radiates down left leg in the entire leg to the knee, but big toe is numb. Onset: More than a month ago. Quality: Throbbing. Timing: Constant. Modifying factor(s): oxycodone. Vitals:  height is 5' 11"  (1.803 m) and weight is 260 lb (117.9 kg). His temperature is 98.2 F (36.8 C). His blood pressure is 121/69 and his pulse is 78. His respiration is 18 and oxygen saturation is 100%.   Reason for encounter: medication management.  The patient indicates doing well with the current medication regimen. No adverse reactions or side effects reported to the medications.  The patient returns today indicating that he has a flareup of his low back pain and lower extremity pain.  He refers that it is worse on the left side and the pain is worse in the lower back than in the legs.  However, he indicates that he had an episode where he couldn't walk and at that time his pain was going all the way down to the top of his foot and his big toe and what seems to be an L5 dermatomal distribution.  However, this portion seems to be better today and the pain is only going into the ankle region and traveling through the back of the leg, following a referred pattern distribution.  The patient has positive reproduction of the pain with provocative maneuvers such as hyperextension and rotation, as well as the [employer].  This would suggest recurrence of his facet syndrome.  In the past we have done lumbar facet blocks which have provided him with good relief of the pain and therefore today  we'll go ahead and schedule him to return  for a therapeutic bilateral lumbar facet block under fluoroscopic guidance and IV sedation.  Today I also took some time to explain to him again about the opioids and the possible issues with respiratory depression.  I have also provided him with a Narcan injection to be used in case of emergencies.  His partner was in the room with him today and she seems to be a Marine scientist.  I make sure to explain to them how to use the Narcan and I pointed out that in the event that something happened he would most likely be her doing the injection not him.  They understood and accepted.  RTCB: 08/08/2020 Nonopioids transferred 05/07/2021: Calcium, magnesium, vitamin D3.  Pharmacotherapy Assessment   Analgesic: Oxycodone IR 10 mg 4 times daily MME/day: 77m/day.   Monitoring: [place] PMP: PDMP reviewed during this encounter.       Pharmacotherapy: No side-effects or adverse reactions reported. Compliance: No problems identified. Effectiveness: Clinically acceptable.  TDewayne Shorter RN  05/07/2020 10:17 AM  Signed Nursing Pain Medication Assessment:  Safety precautions to be maintained throughout the outpatient stay will include: orient to surroundings, keep bed in low position, maintain call bell within reach at all times, provide assistance with transfer out of bed and ambulation.  Medication Inspection Compliance: Pill count conducted under aseptic conditions, in front of the patient. Neither the pills nor the bottle was removed from the patient's sight at any time. Once count was completed pills were immediately returned to the patient in their original bottle.  Medication: Oxycodone IR Pill/Patch Count: 17 of 120 pills remain Pill/Patch Appearance: Markings consistent with prescribed medication Bottle Appearance: Standard pharmacy container. Clearly labeled. Filled Date: 09 / 23 / 2021 Last Medication intake:  Today    UDS:  Summary  Date Value Ref Range Status  11/15/2019 Note  Final    Comment:     ==================================================================== [employer] [facility] (MW) ==================================================================== Test                             Result       Flag       Units Drug Present and Declared for Prescription Verification   Oxycodone                      1444         EXPECTED   ng/mg creat   Oxymorphone                    2592         EXPECTED   ng/mg creat   Noroxycodone                   1773         EXPECTED   ng/mg creat   Noroxymorphone                 717          EXPECTED   ng/mg creat    Sources of oxycodone are scheduled prescription medications.    Oxymorphone, noroxycodone, and noroxymorphone are expected    metabolites of oxycodone. Oxymorphone is also available as a    scheduled prescription medication. ==================================================================== Test                      Result    Flag   Units  Ref Range   Creatinine              196              mg/dL      >=20 ==================================================================== Declared Medications:  The flagging and interpretation on this report are based on the  following declared medications.  Unexpected results may arise from  inaccuracies in the declared medications.  **Note: The testing scope of this panel includes these medications:  Oxycodone  **Note: The testing scope of this panel does not include the  following reported medications:  Calcium  Cholecalciferol  Loratadine  Magnesium (Mag-Ox)  Meloxicam  Metformin  Montelukast  Naloxone  Vitamin D ==================================================================== For clinical consultation, please call [phone]. ====================================================================      ROS  Constitutional: Denies any fever or chills Gastrointestinal: No reported hemesis, hematochezia, vomiting, or acute GI distress Musculoskeletal: Denies any acute onset  joint swelling, redness, loss of ROM, or weakness Neurological: No reported episodes of acute onset apraxia, aphasia, dysarthria, agnosia, amnesia, paralysis, loss of coordination, or loss of consciousness  Medication Review  GNP Calcium 1200, Magnesium, Oxycodone HCl, Vitamin D3, azelastine, calcium carbonate, clobetasol ointment, doxycycline, econazole nitrate, fluticasone, gentamicin cream, loratadine, meloxicam, metFORMIN, montelukast, naloxone, and terbinafine  History Review  Allergy: [name] is allergic to bactrim [sulfamethoxazole-trimethoprim], pollen extract, and sulfa antibiotics. Drug: [name]  reports no history of drug use. Alcohol:  reports no history of alcohol use. Tobacco:  reports that he has been smoking cigarettes. He has a 10.00 pack-year smoking history. He has never used smokeless tobacco. Social: [name]  reports that he has been smoking cigarettes. He has a 10.00 pack-year smoking history. He has never used smokeless tobacco. He reports that he does not drink alcohol and does not use drugs. Medical:  has a past medical history of Allergy (2012), Chronic low back pain (Primary Area of Pain) (Bilateral) (R>L), Degeneration of lumbar or lumbosacral intervertebral disc (L4-L5) (12/27/2016), Headache, and Opiate use (02/15/2017). Surgical: [name]  has a past surgical history that includes Fracture surgery (Left); Spine surgery (Right, 06/16/2015); and Back surgery. Family: family history includes AAA (abdominal aortic aneurysm) in his mother; Diabetes in his father; Stroke in his mother.  Laboratory Chemistry Profile   Renal Lab Results  Component Value Date   BUN 10 06/07/2019   CREATININE 0.76 06/07/2019   BCR 7 (L) 12/27/2016   GFRAA >60 06/07/2019   GFRNONAA >60 06/07/2019     Hepatic Lab Results  Component Value Date   AST 14 (L) 06/07/2019   ALT 17 06/07/2019   ALBUMIN 4.0 06/07/2019   ALKPHOS 50 06/07/2019      Electrolytes Lab Results  Component Value Date   NA 138 06/07/2019   K 4.3 06/07/2019   CL 104 06/07/2019   CALCIUM 9.0 06/07/2019   MG 2.0 06/07/2019     Bone Lab Results  Component Value Date   VD25OH 15.51 (L) 06/07/2019   25OHVITD1 17 (L) 12/27/2016   25OHVITD2 <1.0 12/27/2016   25OHVITD3 17 12/27/2016   TESTOFREE 2.6 (L) 06/08/2017   TESTOSTERONE 116 (L) 06/08/2017     Inflammation (CRP: Acute Phase) (ESR: Chronic Phase) Lab Results  Component Value Date   CRP 2.7 (H) 06/07/2019   ESRSEDRATE 17 (H) 06/07/2019       Note: Above Lab results reviewed.  Recent Imaging Review  DG C-Arm 1-60 Min-No Report Fluoroscopy was utilized by the requesting physician.  No radiographic  interpretation.  Note:  Reviewed        Physical Exam  General appearance: Well nourished, well developed, and well hydrated. In no apparent acute distress Mental status: Alert, oriented x 3 (person, place, & time)       Respiratory: No evidence of acute respiratory distress Eyes: PERLA Vitals: BP 121/69   Pulse 78   Temp 98.2 F (36.8 C)   Resp 18   Ht 5' 11"  (1.803 m)   Wt 260 lb (117.9 kg)   SpO2 100%   BMI 36.26 kg/m  BMI: Estimated body mass index is 36.26 kg/m as calculated from the following:   Height as of this encounter: 5' 11"  (1.803 m).   Weight as of this encounter: 260 lb (117.9 kg). Ideal: Ideal body weight: 75.3 kg (166 lb 0.1 oz) Adjusted ideal body weight: 92.4 kg (203 lb 9.7 oz)  Assessment   Status Diagnosis  Controlled Worsened Stable 1. Chronic pain syndrome   2. Chronic low back pain (Primary Area of Pain) (Bilateral) (R>L)   3. Lumbar facet syndrome (Bilateral) (R>L)   4. Lumbar facet hypertrophy (Bilateral)   5. 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 Lumbar back pain with radiculopathy affecting left lower extremity (L5 dermatomal distribution)   7. Subacute lumbar radiculopathy (L5) (Left)   8. Left hip pain   9. Chronic knee pain  (Secondary Area of pain) (Left)   10. Pharmacologic therapy   11. Opiate use (60 MME/Day)   12. Vitamin D deficiency      Updated Problems: Problem  Lumbar back pain with radiculopathy affecting left lower extremity (L5 dermatomal distribution)  Subacute lumbar radiculopathy (L5) (Left)    Plan of Care  Problem-specific:  No problem-specific Assessment & Plan notes found for this encounter.  [name] has a current medication list which includes the following long-term medication(s): azelastine, gnp calcium 1200, fluticasone, loratadine, metformin, montelukast, calcium carbonate, vitamin d3, magnesium, naloxone, [START ON 05/10/2020] oxycodone hcl, [START ON 06/09/2020] oxycodone hcl, and [START ON 07/09/2020] oxycodone hcl.  Pharmacotherapy (Medications Ordered): Meds ordered this encounter  Medications  . Oxycodone HCl 10 MG TABS    Sig: Take 1 tablet (10 mg total) by mouth every 6 (six) hours as needed. Must last 30 days    Dispense:  120 tablet    Refill:  0    Chronic Pain: STOP Act (Not applicable) Fill 1 day early if closed on refill date. Avoid benzodiazepines within 8 hours of opioids  . Cholecalciferol (VITAMIN D3) 125 MCG (5000 UT) CAPS    Sig: Take 1 capsule (5,000 Units total) by mouth daily with breakfast. Take along with calcium and magnesium.    Dispense:  90 capsule    Refill:  0    Fill one day early if pharmacy is closed on scheduled refill date. May substitute for generic if available.  . Magnesium 500 MG CAPS    Sig: Take 1 capsule (500 mg total) by mouth 2 (two) times daily at 8 am and 10 pm.    Dispense:  180 capsule    Refill:  0    Fill one day early if pharmacy is closed on scheduled refill date. May substitute for generic if available.  . naloxone (NARCAN) 2 MG/2ML injection    Sig: Inject 1 mL (1 mg total) into the muscle as needed for up to 2 doses (for opioid overdose). In case of emergency (overdose), inject into muscle of upper  arm or leg and  call 911.    Dispense:  2 mL    Refill:  0    Please instruct patient on the emergency use of this medication.  . calcium carbonate (OSCAL) 1500 (600 Ca) MG TABS tablet    Sig: Take 1 tablet (1,500 mg total) by mouth 2 (two) times daily with a meal.    Dispense:  180 tablet    Refill:  0    Fill one day early if pharmacy is closed on scheduled refill date. May substitute for generic, or similar, if available.  . Oxycodone HCl 10 MG TABS    Sig: Take 1 tablet (10 mg total) by mouth every 6 (six) hours as needed. Must last 30 days    Dispense:  120 tablet    Refill:  0    Chronic Pain: STOP Act (Not applicable) Fill 1 day early if closed on refill date. Avoid benzodiazepines within 8 hours of opioids  . Oxycodone HCl 10 MG TABS    Sig: Take 1 tablet (10 mg total) by mouth every 6 (six) hours as needed. Must last 30 days    Dispense:  120 tablet    Refill:  0    Chronic Pain: STOP Act (Not applicable) Fill 1 day early if closed on refill date. Avoid benzodiazepines within 8 hours of opioids   Orders:  Orders Placed This Encounter  Procedures  . LUMBAR FACET(MEDIAL BRANCH NERVE BLOCK) MBNB    Standing Status:   Future    Standing Expiration Date:   06/07/2020    Scheduling Instructions:     Procedure: Lumbar facet block (AKA.: Lumbosacral medial branch nerve block)     Side: Bilateral     Level: L3-4, L4-5, & L5-S1 Facets (L2, L3, L4, L5, & S1 Medial Branch Nerves)     Sedation: Patient's choice.     Timeframe: ASAA    Order Specific Question:   Where will this procedure be performed?    Answer:   [facility]   Follow-up plan:   Return for Procedure (w/ sedation): (B) L-FCT BLK.      Considering:   Diagnostic left SI joint block Possible left SI joint RFA Diagnostic caudal ESI + diagnostic epidurogram Possible Racz procedure Diagnostic left IA knee injection (w/ steroid) Possible left IA Hyalgan knee injections Diagnostic left Genicular  NB Possible left Genicular nerve RFA   Palliative PRN treatment(s):   Palliative bilateral lumbar facet block  Palliative right SI joint block  Palliative right lumbar facet + sacroiliac joint RFA #2(last one done on 10/11/2017) Palliative left lumbar facet RFA #2(last one done on 11/24/2017)  Diagnostic right L4 TFES2 #1under fluoro and IV sedation.     Recent Visits No visits were found meeting these conditions. Showing recent visits within past 90 days and meeting all other requirements Today's Visits Date Type Provider Dept  05/07/20 Office Visit Milinda Pointer, MD [facility]  Showing today's visits and meeting all other requirements Future Appointments Date Type Provider Dept  05/15/20 Appointment Milinda Pointer, MD [facility]  Showing future appointments within next 90 days and meeting all other requirements  I discussed the assessment and treatment plan with the patient. The patient was provided an opportunity to ask questions and all were answered. The patient agreed with the plan and demonstrated an understanding of the instructions.  Patient advised to call back or seek an in-person evaluation if the symptoms or condition worsens.  Duration of encounter: 47 minutes.  Note by:  Gaspar Cola, MD Date: 05/07/2020; Time: 10:45 AM

## 2020-05-07 ENCOUNTER — Ambulatory Visit: Payer: BLUE CROSS/BLUE SHIELD | Attending: Pain Medicine | Admitting: Pain Medicine

## 2020-05-07 ENCOUNTER — Other Ambulatory Visit: Payer: Self-pay

## 2020-05-07 ENCOUNTER — Encounter: Payer: Self-pay | Admitting: Pain Medicine

## 2020-05-07 VITALS — BP 121/69 | HR 78 | Temp 98.2°F | Resp 18 | Ht 71.0 in | Wt 260.0 lb

## 2020-05-07 DIAGNOSIS — G894 Chronic pain syndrome: Secondary | ICD-10-CM

## 2020-05-07 DIAGNOSIS — F1190 Opioid use, unspecified, uncomplicated: Secondary | ICD-10-CM

## 2020-05-07 DIAGNOSIS — M5442 Lumbago with sciatica, left side: Secondary | ICD-10-CM | POA: Diagnosis not present

## 2020-05-07 DIAGNOSIS — E559 Vitamin D deficiency, unspecified: Secondary | ICD-10-CM

## 2020-05-07 DIAGNOSIS — M48061 Spinal stenosis, lumbar region without neurogenic claudication: Secondary | ICD-10-CM | POA: Diagnosis not present

## 2020-05-07 DIAGNOSIS — M47816 Spondylosis without myelopathy or radiculopathy, lumbar region: Secondary | ICD-10-CM

## 2020-05-07 DIAGNOSIS — G8929 Other chronic pain: Secondary | ICD-10-CM

## 2020-05-07 DIAGNOSIS — M5441 Lumbago with sciatica, right side: Secondary | ICD-10-CM

## 2020-05-07 DIAGNOSIS — Z79899 Other long term (current) drug therapy: Secondary | ICD-10-CM

## 2020-05-07 DIAGNOSIS — M25552 Pain in left hip: Secondary | ICD-10-CM

## 2020-05-07 DIAGNOSIS — M5416 Radiculopathy, lumbar region: Secondary | ICD-10-CM

## 2020-05-07 DIAGNOSIS — M25562 Pain in left knee: Secondary | ICD-10-CM

## 2020-05-07 MED ORDER — CALCIUM CARBONATE 1500 (600 CA) MG PO TABS: 600.0000 mg | ORAL_TABLET | Freq: Two times a day (BID) | ORAL | 0 refills | Status: DC

## 2020-05-07 MED ORDER — OXYCODONE HCL 10 MG PO TABS: 10.0000 mg | ORAL_TABLET | Freq: Four times a day (QID) | ORAL | 0 refills | Status: DC | PRN

## 2020-05-07 MED ORDER — VITAMIN D3 125 MCG (5000 UT) PO CAPS: 1.0000 | ORAL_CAPSULE | Freq: Every day | ORAL | 0 refills | Status: DC

## 2020-05-07 MED ORDER — NALOXONE HCL 2 MG/2ML IJ SOSY: 1.0000 mg | PREFILLED_SYRINGE | INTRAMUSCULAR | 0 refills | Status: DC | PRN

## 2020-05-07 MED ORDER — MAGNESIUM 500 MG PO CAPS: 500.0000 mg | ORAL_CAPSULE | Freq: Two times a day (BID) | ORAL | 0 refills | Status: DC

## 2020-05-07 NOTE — Progress Notes (Signed)
Nursing Pain Medication Assessment:  Safety precautions to be maintained throughout the outpatient stay will include: orient to surroundings, keep bed in low position, maintain call bell within reach at all times, provide assistance with transfer out of bed and ambulation.  Medication Inspection Compliance: Pill count conducted under aseptic conditions, in front of the patient. Neither the pills nor the bottle was removed from the patient's sight at any time. Once count was completed pills were immediately returned to the patient in their original bottle.  Medication: Oxycodone IR Pill/Patch Count: 17 of 120 pills remain Pill/Patch Appearance: Markings consistent with prescribed medication Bottle Appearance: Standard pharmacy container. Clearly labeled. Filled Date: 09 / 23 / 2021 Last Medication intake:  Today

## 2020-05-07 NOTE — Patient Instructions (Signed)

## 2020-05-15 ENCOUNTER — Ambulatory Visit (HOSPITAL_BASED_OUTPATIENT_CLINIC_OR_DEPARTMENT_OTHER): Payer: BLUE CROSS/BLUE SHIELD | Admitting: Pain Medicine

## 2020-05-15 ENCOUNTER — Encounter: Payer: Self-pay | Admitting: Pain Medicine

## 2020-05-15 ENCOUNTER — Other Ambulatory Visit: Payer: Self-pay

## 2020-05-15 ENCOUNTER — Ambulatory Visit
Admission: RE | Admit: 2020-05-15 | Discharge: 2020-05-15 | Disposition: A | Discharge status: Home / Self Care | Payer: BLUE CROSS/BLUE SHIELD | Source: Ambulatory Visit | Attending: Pain Medicine | Admitting: Pain Medicine

## 2020-05-15 VITALS — BP 127/80 | HR 80 | Temp 97.0°F | Resp 10 | Ht 71.0 in | Wt 260.0 lb

## 2020-05-15 DIAGNOSIS — M47816 Spondylosis without myelopathy or radiculopathy, lumbar region: Secondary | ICD-10-CM

## 2020-05-15 DIAGNOSIS — G8929 Other chronic pain: Secondary | ICD-10-CM | POA: Insufficient documentation

## 2020-05-15 DIAGNOSIS — M5136 Other intervertebral disc degeneration, lumbar region: Secondary | ICD-10-CM

## 2020-05-15 DIAGNOSIS — M545 Low back pain, unspecified: Secondary | ICD-10-CM | POA: Diagnosis present

## 2020-05-15 MED ORDER — ROPIVACAINE HCL 2 MG/ML IJ SOLN
18.0000 mL | Freq: Once | INTRAMUSCULAR | Status: AC
  Administered 2020-05-15: 18 mL via PERINEURAL
  Filled 2020-05-15: qty 20

## 2020-05-15 MED ORDER — MIDAZOLAM HCL 5 MG/5ML IJ SOLN
1.0000 mg | INTRAMUSCULAR | Status: DC | PRN
  Administered 2020-05-15: 4 mg via INTRAVENOUS
  Filled 2020-05-15: qty 5

## 2020-05-15 MED ORDER — LACTATED RINGERS IV SOLN
1000.0000 mL | Freq: Once | INTRAVENOUS | Status: AC
  Administered 2020-05-15: 1000 mL via INTRAVENOUS

## 2020-05-15 MED ORDER — FENTANYL CITRATE (PF) 100 MCG/2ML IJ SOLN
25.0000 ug | INTRAMUSCULAR | Status: DC | PRN
  Administered 2020-05-15: 100 ug via INTRAVENOUS
  Filled 2020-05-15: qty 2

## 2020-05-15 MED ORDER — TRIAMCINOLONE ACETONIDE 40 MG/ML IJ SUSP
80.0000 mg | Freq: Once | INTRAMUSCULAR | Status: AC
  Administered 2020-05-15: 80 mg
  Filled 2020-05-15: qty 2

## 2020-05-15 MED ORDER — LIDOCAINE HCL 2 % IJ SOLN
20.0000 mL | Freq: Once | INTRAMUSCULAR | Status: AC
  Administered 2020-05-15: 400 mg
  Filled 2020-05-15: qty 40

## 2020-05-15 NOTE — Progress Notes (Signed)
Safety precautions to be maintained throughout the outpatient stay will include: orient to surroundings, keep bed in low position, maintain call bell within reach at all times, provide assistance with transfer out of bed and ambulation.  

## 2020-05-15 NOTE — Patient Instructions (Signed)

## 2020-05-15 NOTE — Progress Notes (Signed)
PROVIDER NOTE: Information contained herein reflects review and annotations entered in association with encounter. Interpretation of such information and data should be left to medically-trained personnel. Information provided to patient can be located elsewhere in the medical record under "Patient Instructions". Document created using STT-dictation technology, any transcriptional errors that may result from process are unintentional.    Patient: Nathan Lambert.  Service Category: Procedure  Provider: Oswaldo Done, MD  DOB: [birth year]  DOS: 05/15/2020  Location: [facility]  MRN: [id number]  Setting: Ambulatory - outpatient  Referring Provider: Smith Robert, MD  Type: Established Patient  Specialty: Interventional Pain Management  PCP: Smith Robert, MD   Primary Reason for Visit: Interventional Pain Management Treatment. CC: Back Pain (low)  Procedure:          Anesthesia, Analgesia, Anxiolysis:  Type: Lumbar Facet, Medial Branch Block(s)          Primary Purpose: Therapeutic Region: Posterolateral Lumbosacral Spine Level: L2, L3, L4, L5, & S1 Medial Branch Level(s). Injecting these levels blocks the L3-4, L4-5, and L5-S1 lumbar facet joints. Laterality: Bilateral  Type: Moderate (Conscious) Sedation combined with Local Anesthesia Indication(s): Analgesia and Anxiety Route: Intravenous (IV) IV Access: Secured Sedation: Meaningful verbal contact was maintained at all times during the procedure  Local Anesthetic: Lidocaine 1-2%  Position: Prone   Indications: 1. Lumbar facet syndrome (Bilateral) (R>L)   2. Lumbar facet hypertrophy (Bilateral)   3. Spondylosis without myelopathy or radiculopathy, lumbar region   4. DDD (degenerative disc disease), lumbar   5. Chronic low back pain (Bilateral) w/o sciatica    Pain Score: Pre-procedure: 3 /10 Post-procedure: 2 /10   Pre-op Assessment:  [name] is a 39 y.o. (year old), male patient, seen  today for interventional treatment. He  has a past surgical history that includes Fracture surgery (Left); Spine surgery (Right, 06/16/2015); and Back surgery. [name] has a current medication list which includes the following prescription(s): azelastine, calcium carbonate, gnp calcium 1200, vitamin d3, clobetasol ointment, doxycycline, econazole nitrate, fluticasone, gentamicin cream, loratadine, magnesium, metformin, montelukast, naloxone, oxycodone hcl, [START ON 06/09/2020] oxycodone hcl, [START ON 07/09/2020] oxycodone hcl, terbinafine, and meloxicam, and the following Facility-Administered Medications: fentanyl and midazolam. His primarily concern today is the Back Pain (low)  Initial Vital Signs:  Pulse/HCG Rate: 80ECG Heart Rate: 63 Temp: (!) 97.4 F (36.3 C) Resp: 16 BP: 121/74 SpO2: 99 %  BMI: Estimated body mass index is 36.26 kg/m as calculated from the following:   Height as of this encounter: 5\' 11"  (1.803 m).   Weight as of this encounter: 260 lb (117.9 kg).  Risk Assessment: Allergies: Reviewed. He is allergic to bactrim [sulfamethoxazole-trimethoprim], pollen extract, and sulfa antibiotics.  Allergy Precautions: None required Coagulopathies: Reviewed. None identified.  Blood-thinner therapy: None at this time Active Infection(s): Reviewed. None identified. [name] is afebrile  Site Confirmation: [name] was asked to confirm the procedure and laterality before marking the site Procedure checklist: Completed Consent: Before the procedure and under the influence of no sedative(s), amnesic(s), or anxiolytics, the patient was informed of the treatment options, risks and possible complications. To fulfill our ethical and legal obligations, as recommended by the American Medical Association's Code of Ethics, I have informed the patient of my clinical impression; the nature and purpose of the treatment or procedure; the risks, benefits, and possible  complications of the intervention; the alternatives, including doing nothing; the risk(s) and benefit(s) of the alternative treatment(s) or procedure(s); and the risk(s) and benefit(s)  of doing nothing. The patient was provided information about the general risks and possible complications associated with the procedure. These may include, but are not limited to: failure to achieve desired goals, infection, bleeding, organ or nerve damage, allergic reactions, paralysis, and death. In addition, the patient was informed of those risks and complications associated to Spine-related procedures, such as failure to decrease pain; infection (i.e.: Meningitis, epidural or intraspinal abscess); bleeding (i.e.: epidural hematoma, subarachnoid hemorrhage, or any other type of intraspinal or peri-dural bleeding); organ or nerve damage (i.e.: Any type of peripheral nerve, nerve root, or spinal cord injury) with subsequent damage to sensory, motor, and/or autonomic systems, resulting in permanent pain, numbness, and/or weakness of one or several areas of the body; allergic reactions; (i.e.: anaphylactic reaction); and/or death. Furthermore, the patient was informed of those risks and complications associated with the medications. These include, but are not limited to: allergic reactions (i.e.: anaphylactic or anaphylactoid reaction(s)); adrenal axis suppression; blood sugar elevation that in diabetics may result in ketoacidosis or comma; water retention that in patients with history of congestive heart failure may result in shortness of breath, pulmonary edema, and decompensation with resultant heart failure; weight gain; swelling or edema; medication-induced neural toxicity; particulate matter embolism and blood vessel occlusion with resultant organ, and/or nervous system infarction; and/or aseptic necrosis of one or more joints. Finally, the patient was informed that Medicine is not an exact science; therefore, there is also  the possibility of unforeseen or unpredictable risks and/or possible complications that may result in a catastrophic outcome. The patient indicated having understood very clearly. We have given the patient no guarantees and we have made no promises. Enough time was given to the patient to ask questions, all of which were answered to the patient's satisfaction. Mr. Thebeau has indicated that he wanted to continue with the procedure. Attestation: I, the ordering provider, attest that I have discussed with the patient the benefits, risks, side-effects, alternatives, likelihood of achieving goals, and potential problems during recovery for the procedure that I have provided informed consent. Date  Time: 05/15/2020 11:03 AM  Pre-Procedure Preparation:  Monitoring: As per clinic protocol. Respiration, ETCO2, SpO2, BP, heart rate and rhythm monitor placed and checked for adequate function Safety Precautions: Patient was assessed for positional comfort and pressure points before starting the procedure. Time-out: I initiated and conducted the "Time-out" before starting the procedure, as per protocol. The patient was asked to participate by confirming the accuracy of the "Time Out" information. Verification of the correct person, site, and procedure were performed and confirmed by me, the nursing staff, and the patient. "Time-out" conducted as per Joint Commission's Universal Protocol (UP.01.01.01). Time: 1146  Description of Procedure:          Laterality: Bilateral. The procedure was performed in identical fashion on both sides. Levels:  L2, L3, L4, L5, & S1 Medial Branch Level(s) Area Prepped: Posterior Lumbosacral Region DuraPrep (Iodine Povacrylex [0.7% available iodine] and Isopropyl Alcohol, 74% w/w) Safety Precautions: Aspiration looking for blood return was conducted prior to all injections. At no point did we inject any substances, as a needle was being advanced. Before injecting, the patient was  told to immediately notify me if he was experiencing any new onset of "ringing in the ears, or metallic taste in the mouth". No attempts were made at seeking any paresthesias. Safe injection practices and needle disposal techniques used. Medications properly checked for expiration dates. SDV (single dose vial) medications used. After the completion of the procedure, all  disposable equipment used was discarded in the proper designated medical waste containers. Local Anesthesia: Protocol guidelines were followed. The patient was positioned over the fluoroscopy table. The area was prepped in the usual manner. The time-out was completed. The target area was identified using fluoroscopy. A 12-in long, straight, sterile hemostat was used with fluoroscopic guidance to locate the targets for each level blocked. Once located, the skin was marked with an approved surgical skin marker. Once all sites were marked, the skin (epidermis, dermis, and hypodermis), as well as deeper tissues (fat, connective tissue and muscle) were infiltrated with a small amount of a short-acting local anesthetic, loaded on a 10cc syringe with a 25G, 1.5-in  Needle. An appropriate amount of time was allowed for local anesthetics to take effect before proceeding to the next step. Local Anesthetic: Lidocaine 2.0% The unused portion of the local anesthetic was discarded in the proper designated containers. Technical explanation of process:  L2 Medial Branch Nerve Block (MBB): The target area for the L2 medial branch is at the junction of the postero-lateral aspect of the superior articular process and the superior, posterior, and medial edge of the transverse process of L3. Under fluoroscopic guidance, a Quincke needle was inserted until contact was made with os over the superior postero-lateral aspect of the pedicular shadow (target area). After negative aspiration for blood, 0.5 mL of the nerve block solution was injected without difficulty or  complication. The needle was removed intact. L3 Medial Branch Nerve Block (MBB): The target area for the L3 medial branch is at the junction of the postero-lateral aspect of the superior articular process and the superior, posterior, and medial edge of the transverse process of L4. Under fluoroscopic guidance, a Quincke needle was inserted until contact was made with os over the superior postero-lateral aspect of the pedicular shadow (target area). After negative aspiration for blood, 0.5 mL of the nerve block solution was injected without difficulty or complication. The needle was removed intact. L4 Medial Branch Nerve Block (MBB): The target area for the L4 medial branch is at the junction of the postero-lateral aspect of the superior articular process and the superior, posterior, and medial edge of the transverse process of L5. Under fluoroscopic guidance, a Quincke needle was inserted until contact was made with os over the superior postero-lateral aspect of the pedicular shadow (target area). After negative aspiration for blood, 0.5 mL of the nerve block solution was injected without difficulty or complication. The needle was removed intact. L5 Medial Branch Nerve Block (MBB): The target area for the L5 medial branch is at the junction of the postero-lateral aspect of the superior articular process and the superior, posterior, and medial edge of the sacral ala. Under fluoroscopic guidance, a Quincke needle was inserted until contact was made with os over the superior postero-lateral aspect of the pedicular shadow (target area). After negative aspiration for blood, 0.5 mL of the nerve block solution was injected without difficulty or complication. The needle was removed intact. S1 Medial Branch Nerve Block (MBB): The target area for the S1 medial branch is at the posterior and inferior 6 o'clock position of the L5-S1 facet joint. Under fluoroscopic guidance, the Quincke needle inserted for the L5 MBB was  redirected until contact was made with os over the inferior and postero aspect of the sacrum, at the 6 o' clock position under the L5-S1 facet joint (Target area). After negative aspiration for blood, 0.5 mL of the nerve block solution was injected without difficulty or complication.  The needle was removed intact.  Nerve block solution: 0.2% PF-Ropivacaine + Triamcinolone (40 mg/mL) diluted to a final concentration of 4 mg of Triamcinolone/mL of Ropivacaine The unused portion of the solution was discarded in the proper designated containers. Procedural Needles: 22-gauge, 7-inch, Quincke needles used for all levels.  Once the entire procedure was completed, the treated area was cleaned, making sure to leave some of the prepping solution back to take advantage of its long term bactericidal properties.   Illustration of the posterior view of the lumbar spine and the posterior neural structures. Laminae of L2 through S1 are labeled. DPRL5, dorsal primary ramus of L5; DPRS1, dorsal primary ramus of S1; DPR3, dorsal primary ramus of L3; FJ, facet (zygapophyseal) joint L3-L4; I, inferior articular process of L4; LB1, lateral branch of dorsal primary ramus of L1; IAB, inferior articular branches from L3 medial branch (supplies L4-L5 facet joint); IBP, intermediate branch plexus; MB3, medial branch of dorsal primary ramus of L3; NR3, third lumbar nerve root; S, superior articular process of L5; SAB, superior articular branches from L4 (supplies L4-5 facet joint also); TP3, transverse process of L3.  Vitals:   05/15/20 1156 05/15/20 1204 05/15/20 1214 05/15/20 1225  BP: 111/71 120/73 127/80   Pulse:      Resp: 20 16 15 10   Temp:  (!) 97.3 F (36.3 C)  (!) 97 F (36.1 C)  TempSrc:  Temporal  Temporal  SpO2: 96% 96% 98% 100%  Weight:      Height:         Start Time: 1146 hrs. End Time:   hrs.  Imaging Guidance (Spinal):          Type of Imaging Technique: Fluoroscopy Guidance (Spinal) Indication(s):  Assistance in needle guidance and placement for procedures requiring needle placement in or near specific anatomical locations not easily accessible without such assistance. Exposure Time: Please see nurses notes. Contrast: None used. Fluoroscopic Guidance: I was personally present during the use of fluoroscopy. "Tunnel Vision Technique" used to obtain the best possible view of the target area. Parallax error corrected before commencing the procedure. "Direction-depth-direction" technique used to introduce the needle under continuous pulsed fluoroscopy. Once target was reached, antero-posterior, oblique, and lateral fluoroscopic projection used confirm needle placement in all planes. Images permanently stored in EMR. Interpretation: No contrast injected. I personally interpreted the imaging intraoperatively. Adequate needle placement confirmed in multiple planes. Permanent images saved into the patient's record.  Antibiotic Prophylaxis:   Anti-infectives (From admission, onward)   None     Indication(s): None identified  Post-operative Assessment:  Post-procedure Vital Signs:  Pulse/HCG Rate: 8069 Temp: (!) 97 F (36.1 C) Resp: 10 BP: 127/80 SpO2: 100 %  EBL: None  Complications: No immediate post-treatment complications observed by team, or reported by patient.  Note: The patient tolerated the entire procedure well. A repeat set of vitals were taken after the procedure and the patient was kept under observation following institutional policy, for this type of procedure. Post-procedural neurological assessment was performed, showing return to baseline, prior to discharge. The patient was provided with post-procedure discharge instructions, including a section on how to identify potential problems. Should any problems arise concerning this procedure, the patient was given instructions to immediately contact , at any time, without hesitation. In any case, we plan to contact the patient by  telephone for a follow-up status report regarding this interventional procedure.  Comments:  No additional relevant information.  Plan of Care  Orders:  Orders Placed This Encounter  Procedures  . LUMBAR FACET(MEDIAL BRANCH NERVE BLOCK) MBNB    Scheduling Instructions:     Procedure: Lumbar facet block (AKA.: Lumbosacral medial branch nerve block)     Side: Bilateral     Level: L3-4, L4-5, & L5-S1 Facets (L2, L3, L4, L5, & S1 Medial Branch Nerves)     Sedation: Patient's choice.     Timeframe: Today    Order Specific Question:   Where will this procedure be performed?    Answer:   [facility]  . [facility] C-ARM 1-60 MIN NO REPORT    Intraoperative interpretation by procedural physician at [facility].    Standing Status:   Standing    Number of Occurrences:   1    Order Specific Question:   Reason for exam:    Answer:   Assistance in needle guidance and placement for procedures requiring needle placement in or near specific anatomical locations not easily accessible without such assistance.  . Informed Consent Details: Physician/Practitioner Attestation; Transcribe to consent form and obtain patient signature    Nursing Order: Transcribe to consent form and obtain patient signature. Note: Always confirm laterality of pain with Mr. Lozito, before procedure.    Order Specific Question:   Physician/Practitioner attestation of informed consent for procedure/surgical case    Answer:   I, the physician/practitioner, attest that I have discussed with the patient the benefits, risks, side effects, alternatives, likelihood of achieving goals and potential problems during recovery for the procedure that I have provided informed consent.    Order Specific Question:   Procedure    Answer:   Lumbar Facet Block  under fluoroscopic guidance    Order Specific Question:   Physician/Practitioner performing the procedure    Answer:   Manasi Dishon A. Laban Emperor MD    Order  Specific Question:   Indication/Reason    Answer:   Low Back Pain, with our without leg pain, due to Facet Joint Arthralgia (Joint Pain) Spondylosis (Arthritis of the Spine), without myelopathy or radiculopathy (Nerve Damage).  . Provide equipment / supplies at bedside    "Block Tray" (Disposable  single use) Needle type: SpinalSpinal Amount/quantity: 4 Size: Long (7-inch) Gauge: 22G    Standing Status:   Standing    Number of Occurrences:   1    Order Specific Question:   Specify    Answer:   Block Tray   Chronic Opioid Analgesic:  Oxycodone IR 10 mg 4 times daily MME/day: /day.   Medications ordered for procedure: Meds ordered this encounter  Medications  . lidocaine (XYLOCAINE) 2 % (with pres) injection 400 mg  . lactated ringers infusion 1,000 mL  . midazolam (VERSED) 5 MG/5ML injection 1-2 mg    Make sure Flumazenil is available in the pyxis when using this medication. If oversedation occurs, administer 0.2 mg IV over 15 sec. If after 45 sec no response, administer 0.2 mg again over 1 min; may repeat at 1 min intervals; not to exceed 4 doses (1 mg)  . fentaNYL (SUBLIMAZE) injection 25-50 mcg    Make sure Narcan is available in the pyxis when using this medication. In the event of respiratory depression (RR< 8/min): Titrate NARCAN (naloxone) in increments of 0.1 to 0.2 mg IV at 2-3 minute intervals, until desired degree of reversal.  . ropivacaine (PF) 2 mg/mL (0.2%) (NAROPIN) injection 18 mL  . triamcinolone acetonide (KENALOG-40) injection 80 mg   Medications administered: We administered lidocaine, lactated ringers, midazolam, fentaNYL, ropivacaine (PF) 2 mg/mL (  0.2%), and triamcinolone acetonide.  See the medical record for exact dosing, route, and time of administration.  Follow-up plan:   Return in about 2 weeks (around 05/29/2020) for (VV), (PP) Follow-up.       Considering:   Diagnostic left SI joint block Possible left SI joint RFA Diagnostic caudal ESI +  diagnostic epidurogram Possible Racz procedure Diagnostic left IA knee injection (w/ steroid) Possible left IA Hyalgan knee injections Diagnostic left Genicular NB Possible left Genicular nerve RFA   Palliative PRN treatment(s):   Palliative bilateral lumbar facet block  Palliative right SI joint block  Palliative right lumbar facet + sacroiliac joint RFA #2(last one done on 10/11/2017) Palliative left lumbar facet RFA #2(last one done on 11/24/2017)  Diagnostic right L4 TFES2 #1under fluoro and IV sedation.      Recent Visits Date Type Provider Dept  05/07/20 Office Visit Delano MetzNaveira, Latajah Thuman, MD [facility]  Showing recent visits within past 90 days and meeting all other requirements Today's Visits Date Type Provider Dept  05/15/20 Procedure visit Delano MetzNaveira, Celeste Candelas, MD [facility]  Showing today's visits and meeting all other requirements Future Appointments Date Type Provider Dept  06/05/20 Appointment Delano MetzNaveira, Kadesha Virrueta, MD [facility]  08/06/20 Appointment Delano MetzNaveira, Clara Smolen, MD [facility]  Showing future appointments within next 90 days and meeting all other requirements  Disposition: Discharge home  Discharge (Date  Time): 05/15/2020; 1230 hrs.   Primary Care Physician: Smith RobertKikel, Stephen, MD [facility] Note by: Oswaldo DoneFrancisco A Aiyah Scarpelli, MD Date: 05/15/2020; Time: 12:54 PM  Disclaimer:  Medicine is not an Visual merchandiserexact science. The only guarantee in medicine is that nothing is guaranteed. It is important to note that the decision to proceed with this intervention was based on the information collected from the patient. The Data and conclusions were drawn from the patient's questionnaire, the interview, and the physical examination. Because the information was provided in large part by the patient, it cannot be guaranteed that it has not been purposely or unconsciously manipulated. Every effort has been  made to obtain as much relevant data as possible for this evaluation. It is important to note that the conclusions that lead to this procedure are derived in large part from the available data. Always take into account that the treatment will also be dependent on availability of resources and existing treatment guidelines, considered by other Pain Management Practitioners as being common knowledge and practice, at the time of the intervention. For Medico-Legal purposes, it is also important to point out that variation in procedural techniques and pharmacological choices are the acceptable norm. The indications, contraindications, technique, and results of the above procedure should only be interpreted and judged by a Board-Certified Interventional Pain Specialist with extensive familiarity and expertise in the same exact procedure and technique.

## 2020-05-16 ENCOUNTER — Telehealth: Payer: Self-pay | Admitting: *Deleted

## 2020-05-16 NOTE — Telephone Encounter (Signed)
Spoke with patient re; procedure on yesterday, no questions or concerns.  

## 2020-06-04 ENCOUNTER — Encounter: Payer: Self-pay | Admitting: Pain Medicine

## 2020-06-05 ENCOUNTER — Other Ambulatory Visit: Payer: Self-pay

## 2020-06-05 ENCOUNTER — Ambulatory Visit: Payer: BLUE CROSS/BLUE SHIELD | Attending: Pain Medicine | Admitting: Pain Medicine

## 2020-06-05 DIAGNOSIS — M25552 Pain in left hip: Secondary | ICD-10-CM | POA: Diagnosis not present

## 2020-06-05 DIAGNOSIS — M47816 Spondylosis without myelopathy or radiculopathy, lumbar region: Secondary | ICD-10-CM

## 2020-06-05 DIAGNOSIS — M545 Low back pain, unspecified: Secondary | ICD-10-CM | POA: Diagnosis not present

## 2020-06-05 DIAGNOSIS — G8929 Other chronic pain: Secondary | ICD-10-CM

## 2020-06-05 DIAGNOSIS — G894 Chronic pain syndrome: Secondary | ICD-10-CM | POA: Diagnosis not present

## 2020-06-05 NOTE — Patient Instructions (Signed)
______________________________________________________________________________________________  Body mass index (BMI)  Body mass index (BMI) is a common tool for deciding whether a person has an appropriate body weight.  It measures a persons weight in relation to their height.   According to the [employer] of health (NIH): Marland Kitchen A BMI of less than 18.5 means that a person is underweight. . A BMI of between 18.5 and 24.9 is ideal. . A BMI of between 25 and 29.9 is overweight. . A BMI over 30 indicates obesity.  Weight Management Required  URGENT: Your weight has been found to be adversely affecting your health.  Dear Nathan Lambert:  Your current Estimated body mass index is 36.26 kg/m as calculated from the following:   Height as of 05/15/20: $RemoveBefo'5\' 11"'zNbyTGufGep$  (1.803 m).   Weight as of 05/15/20: 260 lb (117.9 kg).  Please use the table below to identify your weight category and associated incidence of chronic pain, secondary to your weight.  Body Mass Index (BMI) Classification BMI level (kg/m2) Category Associated incidence of chronic pain  <18  Underweight   18.5-24.9 Ideal body weight   25-29.9 Overweight  20%  30-34.9 Obese (Class I)  68%  35-39.9 Severe obesity (Class II)  136%  >40 Extreme obesity (Class III)  254%   In addition: You will be considered "Morbidly Obese", if your BMI is above 30 and you have one or more of the following conditions which are known to be caused and/or directly associated with obesity: 1.    Type 2 Diabetes (Which in turn can lead to cardiovascular diseases (CVD), stroke, peripheral vascular diseases (PVD), retinopathy, nephropathy, and neuropathy) 2.    Cardiovascular Disease (High Blood Pressure; Congestive Heart Failure; High Cholesterol; Coronary Artery Disease; Angina; or History of Heart Attacks) 3.    Breathing problems (Asthma; obesity-hypoventilation syndrome; obstructive sleep apnea; chronic inflammatory airway disease; reactive airway  disease; or shortness of breath) 4.    Chronic kidney disease 5.    Liver disease (nonalcoholic fatty liver disease) 6.    High blood pressure 7.    Acid reflux (gastroesophageal reflux disease; heartburn) 8.    Osteoarthritis (OA) (with any of the following: hip pain; knee pain; and/or low back pain) 9.    Low back pain (Lumbar Facet Syndrome; and/or Degenerative Disc Disease) 10.  Hip pain (Osteoarthritis of hip) (For every 1 lbs of added body weight, there is a 2 lbs increase in pressure inside of each hip articulation. 1:2 mechanical relationship) 11.  Knee pain (Osteoarthritis of knee) (For every 1 lbs of added body weight, there is a 4 lbs increase in pressure inside of each knee articulation. 1:4 mechanical relationship) (patients with a BMI>30 kg/m2 were 6.8 times more likely to develop knee OA than normal-weight individuals) 12.  Cancer: Epidemiological studies have shown that obesity is a risk factor for: post-menopausal breast cancer; cancers of the endometrium, colon and kidney cancer; malignant adenomas of the oesophagus. Obese subjects have an approximately 1.5-3.5-fold increased risk of developing these cancers compared with normal-weight subjects, and it has been estimated that between 15 and 45% of these cancers can be attributed to overweight. More recent studies suggest that obesity may also increase the risk of other types of cancer, including pancreatic, hepatic and gallbladder cancer. (Ref: Obesity and cancer. Pischon T, Nthlings U, [employer] H. Proc Nutr Soc. 2008 May;67(2):128-45. doi: 98.9211/H4174081448185631.) The International Agency for Research on Cancer (IARC) has identified 13 cancers associated with overweight and obesity: meningioma, multiple myeloma, adenocarcinoma of the esophagus, and  cancers of the thyroid, postmenopausal breast cancer, gallbladder, stomach, liver, pancreas, kidney, ovaries, uterus, colon and rectal (colorectal) cancers. 40 percent of all cancers  diagnosed in women and 24 percent of those diagnosed in men are associated with overweight and obesity.  Recommendation: At this point it is urgent that you take a step back and concentrate in loosing weight. Dedicate 100% of your efforts on this task. Nothing else will improve your health more than bringing your weight down and your BMI to less than 30. If you are here, you probably have chronic pain. We know that most chronic pain patients have difficulty exercising secondary to their pain. For this reason, you must rely on proper nutrition and diet in order to lose the weight. If your BMI is above 40, you should seriously consider bariatric surgery. A realistic goal is to lose 10% of your body weight over a period of 12 months.  Be honest to yourself, if over time you have unsuccessfully tried to lose weight, then it is time for you to seek professional help and to enter a medically supervised weight management program, and/or undergo bariatric surgery. Stop procrastinating.   Pain management considerations:  1.    Pharmacological Problems: Be advised that the use of opioid analgesics (oxycodone; hydrocodone; morphine; methadone; codeine; and all of their derivatives) have been associated with decreased metabolism and weight gain.  For this reason, should we see that you are unable to lose weight while taking these medications, it may become necessary for [place] to taper down and indefinitely discontinue them.  2.    Technical Problems: The incidence of successful interventional therapies decreases as the patient's BMI increases. It is much more difficult to accomplish a safe and effective interventional therapy on a patient with a BMI above 35. 3.    Radiation Exposure Problems: The x-rays machine, used to accomplish injection therapies, will automatically increase their x-ray output in order to capture an appropriate bone image. This means that radiation exposure increases exponentially with the patient's  BMI. (The higher the BMI, the higher the radiation exposure.) Although the level of radiation used at a given time is still safe to the patient, it is not for the physician and/or assisting staff. Unfortunately, radiation exposure is accumulative. Because physicians and the staff have to do procedures and be exposed on a daily basis, this can result in health problems such as cancer and radiation burns. Radiation exposure to the staff is monitored by the radiation batches that they wear. The exposure levels are reported back to the staff on a quarterly basis. Depending on levels of exposure, physicians and staff may be obligated by law to decrease this exposure. This means that they have the right and obligation to refuse providing therapies where they may be overexposed to radiation. For this reason, physicians may decline to offer therapies such as radiofrequency ablation or implants to patients with a BMI above 40. 4.    Current Trends: Be advised that the current trend is to no longer offer certain therapies to patients with a BMI equal to, or above 35, due to increase perioperative risks, increased technical procedural difficulties, and excessive radiation exposure to healthcare personnel.  ______________________________________________________________________________________________

## 2020-06-05 NOTE — Progress Notes (Addendum)
Patient: Nathan Lambert.  Service Category: E/M  Provider: Gaspar Cola, MD  DOB: 05/06/1981  DOS: 06/05/2020  Location: Office  MRN: [id number]  Setting: Ambulatory outpatient  Referring Provider: Vidal Schwalbe, MD  Type: Established Patient  Specialty: Interventional Pain Management  PCP: Vidal Schwalbe, MD  Location: Remote location  Delivery: TeleHealth     Virtual Encounter - Pain Management PROVIDER NOTE: Information contained herein reflects review and annotations entered in association with encounter. Interpretation of such information and data should be left to medically-trained personnel. Information provided to patient can be located elsewhere in the medical record under "Patient Instructions". Document created using STT-dictation technology, any transcriptional errors that may result from process are unintentional.    Contact & Pharmacy Preferred: [phone] Home: [phone] (home) Mobile: [phone] (mobile) E-mail: [email or web address]  [facility], [place] [address] [place] [address] Phone: [phone] Fax: [phone]   Pre-screening  Nathan Lambert offered "in-person" vs "virtual" encounter. He indicated preferring virtual for this encounter.   Reason COVID-19*  Social distancing based on CDC and AMA recommendations.   I contacted Nathan Lambert on 06/05/2020 via telephone.      I clearly identified myself as Gaspar Cola, MD. I verified that I was speaking with the correct person using two identifiers (Name: Nathan Boley., and date of birth: 02-06-1981).  Consent I sought verbal advanced consent from Nathan Lambert. for virtual visit interactions. I informed Nathan Lambert of possible security and privacy concerns, risks, and limitations associated with providing "not-in-person" medical evaluation and management services. I also informed Nathan Lambert  of the availability of "in-person" appointments. Finally, I informed him that there would be a charge for the virtual visit and that he could be  personally, fully or partially, financially responsible for it. Nathan Lambert expressed understanding and agreed to proceed.   Historic Elements   Nathan Lambert. is a 39 y.o. year old, male patient evaluated today after our last contact on 05/15/2020. 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 He also  has a past surgical history that includes Fracture surgery (Left); Spine surgery (Right, 06/16/2015); and Back surgery. Nathan Lambert has a current medication list which includes the following prescription(s): azelastine, calcium carbonate, gnp calcium 1200, vitamin d3, clobetasol ointment, doxycycline, econazole nitrate, fluticasone, gentamicin cream, loratadine, magnesium, metformin, montelukast, naloxone, oxycodone hcl, [START ON 07/09/2020] oxycodone hcl, and terbinafine. He  reports that he has been smoking cigarettes. He has a 10.00 pack-year smoking history. He has never used smokeless tobacco. He reports that he does not drink alcohol and does not use drugs. Nathan Lambert is allergic to bactrim [sulfamethoxazole-trimethoprim], pollen extract, and sulfa antibiotics.   HPI  Today, he is being contacted for a post-procedure assessment.  The results of this bilateral lumbar facet block, which is the first 1 done after his lumbar facet radiofrequency's in 2019, would suggest that the radiofrequency ablations have worn off.  However, today we had a conversation with regards to his weight and he refers that he has not lost any weight at this point.  I have reminded him that he needs to work on doing that since he will improve his low back pain and it  would slow down the degeneration of his lumbar facets.  I have explained to the patient that technically we could go ahead and repeat the radiofrequency ablation since it worked so well for him, however I rather not do that until he brings his weight below a BMI of 35 so that I can minimize my exposure to radiation.  I have explained to him that while our patients are briefly exposed to the radiation, the staff is constantly repeating those procedures and our exposure it is much higher.  He understood and accepted.  Indicated that he would try to work on his weight.  However, for now, he refers having approximately 50% relief of his pain and he indicates that even the numbness and pain that he was having going all the way down to his foot has gone away.  I informed the patient that should this pain get any worse, to feel free and give [place] a call to perhaps continue with palliative injections until he can bring his weight down.  Again he understood and accepted.  Post-Procedure Evaluation  Procedure (05/15/2020): Therapeutic bilateral lumbar facet block #3 under fluoroscopic guidance and IV sedation Pre-procedure pain level: 3/10 Post-procedure: 2/10 (< 50% relief)  Sedation: Sedation provided.  Effectiveness during initial hour after procedure(Ultra-Short Term Relief): 100 %.  Local anesthetic used: Long-acting (4-6 hours) Effectiveness: Defined as any analgesic benefit obtained secondary to the administration of local anesthetics. This carries significant diagnostic value as to the etiological location, or anatomical origin, of the pain. Duration of benefit is expected to coincide with the duration of the local anesthetic used.  Effectiveness during initial 4-6 hours after procedure(Short-Term Relief): 100 %.  Long-term benefit: Defined as any relief past the pharmacologic duration of the local anesthetics.  Effectiveness past the initial 6 hours after procedure(Long-Term Relief): 50 %.  Current  benefits: Defined as benefit that persist at this time.   Analgesia:  50% improved Function: Somewhat improved ROM: Somewhat improved  Pharmacotherapy Assessment  Analgesic: Oxycodone IR 10 mg 4 times daily MME/day: 25m/day.   Monitoring: [place] PMP: PDMP reviewed during this encounter.       Pharmacotherapy: No side-effects or adverse reactions reported. Compliance: No problems identified. Effectiveness: Clinically acceptable. Plan: Refer to "POC".  UDS:  Summary  Date Value Ref Range Status  11/15/2019 Note  Final    Comment:    ==================================================================== [facility] (MW) ==================================================================== Test                             Result       Flag       Units Drug Present and Declared for Prescription Verification   Oxycodone                      1444         EXPECTED   ng/mg creat   Oxymorphone                    2592         EXPECTED   ng/mg creat   Noroxycodone                   1773         EXPECTED   ng/mg creat   Noroxymorphone                 717          EXPECTED   ng/mg creat  Sources of oxycodone are scheduled prescription medications.    Oxymorphone, noroxycodone, and noroxymorphone are expected    metabolites of oxycodone. Oxymorphone is also available as a    scheduled prescription medication. ==================================================================== Test                      Result    Flag   Units      Ref Range   Creatinine              196              mg/dL      >=20 ==================================================================== Declared Medications:  The flagging and interpretation on this report are based on the  following declared medications.  Unexpected results may arise from  inaccuracies in the declared medications.  **Note: The testing scope of this panel includes these medications:  Oxycodone  **Note: The testing scope of this panel does  not include the  following reported medications:  Calcium  Cholecalciferol  Loratadine  Magnesium (Mag-Ox)  Meloxicam  Metformin  Montelukast  Naloxone  Vitamin D ==================================================================== For clinical consultation, please call [phone]. ====================================================================     Laboratory Chemistry Profile   Renal Lab Results  Component Value Date   BUN 10 06/07/2019   CREATININE 0.76 06/07/2019   BCR 7 (L) 12/27/2016   GFRAA >60 06/07/2019   GFRNONAA >60 06/07/2019     Hepatic Lab Results  Component Value Date   AST 14 (L) 06/07/2019   ALT 17 06/07/2019   ALBUMIN 4.0 06/07/2019   ALKPHOS 50 06/07/2019     Electrolytes Lab Results  Component Value Date   NA 138 06/07/2019   K 4.3 06/07/2019   CL 104 06/07/2019   CALCIUM 9.0 06/07/2019   MG 2.0 06/07/2019     Bone Lab Results  Component Value Date   VD25OH 15.51 (L) 06/07/2019   25OHVITD1 17 (L) 12/27/2016   25OHVITD2 <1.0 12/27/2016   25OHVITD3 17 12/27/2016   TESTOFREE 2.6 (L) 06/08/2017   TESTOSTERONE 116 (L) 06/08/2017     Inflammation (CRP: Acute Phase) (ESR: Chronic Phase) Lab Results  Component Value Date   CRP 2.7 (H) 06/07/2019   ESRSEDRATE 17 (H) 06/07/2019       Note: Above Lab results reviewed.  Imaging  DG PAIN CLINIC C-ARM 1-60 MIN NO REPORT Fluoro was used, but no Radiologist interpretation will be provided.  Please refer to "NOTES" tab for provider progress note.  Assessment  The primary encounter diagnosis was Chronic pain syndrome. Diagnoses of Lumbar facet syndrome (Bilateral) (R>L), Chronic low back pain (Bilateral) w/o sciatica, and Left hip pain were also pertinent to this visit.  Plan of Care  Problem-specific:  No problem-specific Assessment & Plan notes found for this encounter.  Nathan Lambert. has a current medication list which includes the following long-term  medication(s): azelastine, calcium carbonate, gnp calcium 1200, vitamin d3, fluticasone, loratadine, magnesium, metformin, montelukast, naloxone, oxycodone hcl, and [START ON 07/09/2020] oxycodone hcl.  Pharmacotherapy (Medications Ordered): No orders of the defined types were placed in this encounter.  Orders:  No orders of the defined types were placed in this encounter.  Follow-up plan:   Return for scheduled encounter.      Considering:   Diagnostic left SI joint block Possible left SI joint RFA Diagnostic caudal ESI + diagnostic epidurogram Possible Racz procedure Diagnostic left IA knee injection (w/ steroid) Possible left IA Hyalgan knee injections Diagnostic left Genicular NB Possible  left Genicular nerve RFA   Palliative PRN treatment(s):   Palliative bilateral lumbar facet block #4 Palliative right SI joint block  Palliative right lumbar facet + sacroiliac joint RFA #2(last one done on 10/11/2017) Palliative left lumbar facet RFA #2(last one done on 11/24/2017)  Diagnostic right L4 TFES2 #1under fluoro and IV sedation.    Recent Visits Date Type Provider Dept  06/05/20 Telemedicine Milinda Pointer, MD [facility]  05/15/20 Procedure visit Milinda Pointer, MD [facility]  05/07/20 Office Visit Milinda Pointer, MD [facility]  Showing recent visits within past 90 days and meeting all other requirements Future Appointments Date Type Provider Dept  08/06/20 Appointment Milinda Pointer, MD [facility]  Showing future appointments within next 90 days and meeting all other requirements  I discussed the assessment and treatment plan with the patient. The patient was provided an opportunity to ask questions and all were answered. The patient agreed with the plan and demonstrated an understanding of the instructions.  Patient advised to call back or seek an in-person evaluation if the symptoms or condition  worsens.  Duration of encounter: 15 minutes.  Note by: Gaspar Cola, MD Date: 06/05/2020; Time: 2:39 PM

## 2020-06-10 NOTE — Addendum Note (Signed)
Addended by: Delano Metz A on: 06/10/2020 02:37 PM   Modules accepted: Orders

## 2020-08-05 DIAGNOSIS — F1120 Opioid dependence, uncomplicated: Secondary | ICD-10-CM | POA: Insufficient documentation

## 2020-08-05 NOTE — Progress Notes (Signed)
PROVIDER NOTE: Information contained herein reflects review and annotations entered in association with encounter. Interpretation of such information and data should be left to medically-trained personnel. Information provided to patient can be located elsewhere in the medical record under "Patient Instructions". Document created using STT-dictation technology, any transcriptional errors that may result from process are unintentional.    Patient: Nathan Lambert.  Service Category: E/M  Provider: Gaspar Cola, MD  DOB: 08/07/1980  DOS: 08/06/2020  Specialty: Interventional Pain Management  MRN: [id number]  Setting: Ambulatory outpatient  PCP: Vidal Schwalbe, MD  Type: Established Patient    Referring Provider: Vidal Schwalbe, MD  Location: Office  Delivery: Face-to-face     HPI  [name], a 40 y.o. year old male, is here today because of his Chronic pain syndrome [G89.4]. Mr. Clauss primary complain today is Back Pain (Low right) Last encounter: My last encounter with him was on 05/15/2020. Pertinent problems: Mr. 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); Chronic lower extremity pain (Bilateral) (L>R); Lumbar facet hypertrophy (Bilateral); Lumbar facet syndrome (Bilateral) (R>L); Neurogenic pain; Spondylosis without myelopathy or radiculopathy, lumbar region; Other specified dorsopathies, sacral and sacrococcygeal region; Lumbar spondylosis; Chronic upper extremity pain (Left); Pain and numbness of left upper extremity; Cervical radiculitis (C6/C7) (Left);  DDD (degenerative disc disease), cervical; Cervicalgia; Osteoarthritis involving multiple joints; Left hip pain; Greater trochanteric bursitis (Left); Lumbar back pain with radiculopathy affecting left lower extremity (L5 dermatomal distribution); Subacute lumbar radiculopathy (L5) (Left); and Chronic low back pain (Bilateral) w/o sciatica on their pertinent problem list. Pain Assessment: Severity of Chronic pain is reported as a 3 /10. Location: Back Lower,Right/denies. Onset: More than a month ago. Quality: Tightness. Timing: Constant. Modifying factor(s): medication, injection. Vitals:  height is 5' 11"  (1.803 m) and weight is 261 lb (118.4 kg). His temperature is 97.3 F (36.3 C) (abnormal). His blood pressure is 121/80 and his pulse is 88. His respiration is 16 and oxygen saturation is 98%.   Reason for encounter: medication management.   The patient indicates doing well with the current medication regimen. No adverse reactions or side effects reported to the medications.   RTCB: 11/06/2020 Nonopioids transferred 05/07/2020: Magnesium, vitamin D3, and calcium  Pharmacotherapy Assessment   Analgesic: Oxycodone IR 10 mg 4 times daily MME/day: 72m/day.   Monitoring: [place] PMP: PDMP reviewed during this encounter.       Pharmacotherapy: No side-effects or adverse reactions reported. Compliance: No problems identified. Effectiveness: Clinically acceptable.  TDewayne Shorter RN  08/06/2020 10:09 AM  Signed Nursing Pain Medication Assessment:  Safety precautions to be maintained throughout the outpatient stay will include: orient to surroundings, keep bed in low position, maintain call bell within reach at all times, provide assistance with transfer out of bed and ambulation.  Medication Inspection Compliance: Pill count conducted under aseptic conditions, in front of the patient. Neither the pills nor the bottle was removed from the patient's sight at any time. Once count was completed pills were  immediately returned to the patient in their original bottle.  Medication: Oxycodone IR Pill/Patch Count: 20 of 120 pills remain Pill/Patch Appearance: Markings consistent with prescribed medication Bottle Appearance: Standard pharmacy container. Clearly labeled.  Filled Date: 69 / 22 / 2021 Last Medication intake:  Today    UDS:  Summary  Date Value Ref Range Status  11/15/2019 Note  Final    Comment:    ==================================================================== [employer] [facility] (MW) ==================================================================== Test                             Result       Flag       Units Drug Present and Declared for Prescription Verification   Oxycodone                      1444         EXPECTED   ng/mg creat   Oxymorphone                    2592         EXPECTED   ng/mg creat   Noroxycodone                   1773         EXPECTED   ng/mg creat   Noroxymorphone                 717          EXPECTED   ng/mg creat    Sources of oxycodone are scheduled prescription medications.    Oxymorphone, noroxycodone, and noroxymorphone are expected    metabolites of oxycodone. Oxymorphone is also available as a    scheduled prescription medication. ==================================================================== Test                      Result    Flag   Units      Ref Range   Creatinine              196              mg/dL      >=20 ==================================================================== Declared Medications:  The flagging and interpretation on this report are based on the  following declared medications.  Unexpected results may arise from  inaccuracies in the declared medications.  **Note: The testing scope of this panel includes these medications:  Oxycodone  **Note: The testing scope of this panel does not include the  following reported medications:  Calcium  Cholecalciferol  Loratadine  Magnesium (Mag-Ox)  Meloxicam   Metformin  Montelukast  Naloxone  Vitamin D ==================================================================== For clinical consultation, please call [phone]. ====================================================================      ROS  Constitutional: Denies any fever or chills Gastrointestinal: No reported hemesis, hematochezia, vomiting, or acute GI distress Musculoskeletal: Denies any acute onset joint swelling, redness, loss of ROM, or weakness Neurological: No reported episodes of acute onset apraxia, aphasia, dysarthria, agnosia, amnesia, paralysis, loss of coordination, or loss of consciousness  Medication Review  GNP Calcium 1200, Magnesium, Oxycodone HCl, Vitamin D3, azelastine, calcium carbonate, clobetasol ointment, doxycycline, econazole nitrate, fluticasone, gentamicin cream, loratadine, metFORMIN, montelukast, naloxone, and terbinafine  History Review  Allergy: Mr. Niblack is allergic to bactrim [sulfamethoxazole-trimethoprim], pollen extract, and sulfa antibiotics. Drug: Mr. Derflinger  reports no history of drug use. Alcohol:  reports no history of alcohol use. Tobacco:  reports that he has been smoking cigarettes. He has a 10.00 pack-year smoking history. He has never used smokeless tobacco. Social: Mr. Rann  reports that he has been smoking cigarettes. He has a 10.00 pack-year smoking history. He has  never used smokeless tobacco. He reports that he does not drink alcohol and does not use drugs. Medical:  has a past medical history of Allergy (2012), Chronic low back pain (Primary Area of Pain) (Bilateral) (R>L), Degeneration of lumbar or lumbosacral intervertebral disc (L4-L5) (12/27/2016), Headache, and Opiate use (02/15/2017). Surgical: Mr. Deeb  has a past surgical history that includes Fracture surgery (Left); Spine surgery (Right, 06/16/2015); and Back surgery. Family: family history includes AAA (abdominal aortic aneurysm) in his mother;  Diabetes in his father; Stroke in his mother.  Laboratory Chemistry Profile   Renal Lab Results  Component Value Date   BUN 10 06/07/2019   CREATININE 0.76 06/07/2019   BCR 7 (L) 12/27/2016   GFRAA >60 06/07/2019   GFRNONAA >60 06/07/2019     Hepatic Lab Results  Component Value Date   AST 14 (L) 06/07/2019   ALT 17 06/07/2019   ALBUMIN 4.0 06/07/2019   ALKPHOS 50 06/07/2019     Electrolytes Lab Results  Component Value Date   NA 138 06/07/2019   K 4.3 06/07/2019   CL 104 06/07/2019   CALCIUM 9.0 06/07/2019   MG 2.0 06/07/2019     Bone Lab Results  Component Value Date   VD25OH 15.51 (L) 06/07/2019   25OHVITD1 17 (L) 12/27/2016   25OHVITD2 <1.0 12/27/2016   25OHVITD3 17 12/27/2016   TESTOFREE 2.6 (L) 06/08/2017   TESTOSTERONE 116 (L) 06/08/2017     Inflammation (CRP: Acute Phase) (ESR: Chronic Phase) Lab Results  Component Value Date   CRP 2.7 (H) 06/07/2019   ESRSEDRATE 17 (H) 06/07/2019       Note: Above Lab results reviewed.  Recent Imaging Review  DG PAIN CLINIC C-ARM 1-60 MIN NO REPORT Fluoro was used, but no Radiologist interpretation will be provided.  Please refer to "NOTES" tab for provider progress note. Note: Reviewed        Physical Exam  General appearance: Well nourished, well developed, and well hydrated. In no apparent acute distress Mental status: Alert, oriented x 3 (person, place, & time)       Respiratory: No evidence of acute respiratory distress Eyes: PERLA Vitals: BP 121/80    Pulse 88    Temp (!) 97.3 F (36.3 C)    Resp 16    Ht 5' 11"  (1.803 m)    Wt 261 lb (118.4 kg)    SpO2 98%    BMI 36.40 kg/m  BMI: Estimated body mass index is 36.4 kg/m as calculated from the following:   Height as of this encounter: 5' 11"  (1.803 m).   Weight as of this encounter: 261 lb (118.4 kg). Ideal: Ideal body weight: 75.3 kg (166 lb 0.1 oz) Adjusted ideal body weight: 92.5 kg (204 lb 0.1 oz)  Assessment   Status Diagnosis   Controlled Controlled Controlled 1. Chronic pain syndrome   2. Lumbar facet syndrome (Bilateral) (R>L)   3. Chronic low back pain (Bilateral) w/o sciatica   4. Left hip pain   5. DDD (degenerative disc disease), lumbar   6. Lumbar back pain with radiculopathy affecting left lower extremity (L5 dermatomal distribution)   7. Pharmacologic therapy   8. Uncomplicated opioid dependence ([place])      Updated Problems: No problems updated.  Plan of Care  Problem-specific:  No problem-specific Assessment & Plan notes found for this encounter.  Mr. Advaith Lamarque. has a current medication list which includes the following long-term medication(s): azelastine, calcium carbonate, gnp calcium 1200, vitamin d3, fluticasone,  loratadine, magnesium, metformin, montelukast, naloxone, [START ON 08/08/2020] oxycodone hcl, [START ON 09/07/2020] oxycodone hcl, and [START ON 10/07/2020] oxycodone hcl.  Pharmacotherapy (Medications Ordered): Meds ordered this encounter  Medications   Oxycodone HCl 10 MG TABS    Sig: Take 1 tablet (10 mg total) by mouth every 6 (six) hours as needed. Must last 30 days    Dispense:  120 tablet    Refill:  0    Chronic Pain: STOP Act (Not applicable) Fill 1 day early if closed on refill date. Avoid benzodiazepines within 8 hours of opioids   Oxycodone HCl 10 MG TABS    Sig: Take 1 tablet (10 mg total) by mouth every 6 (six) hours as needed. Must last 30 days    Dispense:  120 tablet    Refill:  0    Chronic Pain: STOP Act (Not applicable) Fill 1 day early if closed on refill date. Avoid benzodiazepines within 8 hours of opioids   Oxycodone HCl 10 MG TABS    Sig: Take 1 tablet (10 mg total) by mouth every 6 (six) hours as needed. Must last 30 days    Dispense:  120 tablet    Refill:  0    Chronic Pain: STOP Act (Not applicable) Fill 1 day early if closed on refill date. Avoid benzodiazepines within 8 hours of opioids   Orders:  No orders of the defined types  were placed in this encounter.  Follow-up plan:   Return in about 3 months (around 11/06/2020) for (F2F), (Med Mgmt).      Considering:   Diagnostic left SI joint block Possible left SI joint RFA Diagnostic caudal ESI + diagnostic epidurogram Possible Racz procedure Diagnostic left IA knee injection (w/ steroid) Possible left IA Hyalgan knee injections Diagnostic left Genicular NB Possible left Genicular nerve RFA   Palliative PRN treatment(s):   Palliative bilateral lumbar facet block #4 Palliative right SI joint block  Palliative right lumbar facet + sacroiliac joint RFA #2(last one done on 10/11/2017) Palliative left lumbar facet RFA #2(last one done on 11/24/2017)  Diagnostic right L4 TFES2 #1under fluoro and IV sedation.     Recent Visits Date Type Provider Dept  06/05/20 Telemedicine Milinda Pointer, MD [facility]  05/15/20 Procedure visit Milinda Pointer, MD [facility]  Showing recent visits within past 90 days and meeting all other requirements Today's Visits Date Type Provider Dept  08/06/20 Office Visit Milinda Pointer, MD [facility]  Showing today's visits and meeting all other requirements Future Appointments No visits were found meeting these conditions. Showing future appointments within next 90 days and meeting all other requirements  I discussed the assessment and treatment plan with the patient. The patient was provided an opportunity to ask questions and all were answered. The patient agreed with the plan and demonstrated an understanding of the instructions.  Patient advised to call back or seek an in-person evaluation if the symptoms or condition worsens.  Duration of encounter: 30 minutes.  Note by: Gaspar Cola, MD Date: 08/06/2020; Time: 10:17 AM

## 2020-08-06 ENCOUNTER — Ambulatory Visit: Payer: BLUE CROSS/BLUE SHIELD | Attending: Pain Medicine | Admitting: Pain Medicine

## 2020-08-06 ENCOUNTER — Encounter: Payer: Self-pay | Admitting: Pain Medicine

## 2020-08-06 ENCOUNTER — Other Ambulatory Visit: Payer: Self-pay

## 2020-08-06 VITALS — BP 121/80 | HR 88 | Temp 97.3°F | Resp 16 | Ht 71.0 in | Wt 261.0 lb

## 2020-08-06 DIAGNOSIS — M47816 Spondylosis without myelopathy or radiculopathy, lumbar region: Secondary | ICD-10-CM | POA: Diagnosis not present

## 2020-08-06 DIAGNOSIS — M545 Low back pain, unspecified: Secondary | ICD-10-CM | POA: Diagnosis not present

## 2020-08-06 DIAGNOSIS — G894 Chronic pain syndrome: Secondary | ICD-10-CM | POA: Diagnosis not present

## 2020-08-06 DIAGNOSIS — G8929 Other chronic pain: Secondary | ICD-10-CM | POA: Insufficient documentation

## 2020-08-06 DIAGNOSIS — Z79899 Other long term (current) drug therapy: Secondary | ICD-10-CM

## 2020-08-06 DIAGNOSIS — F1120 Opioid dependence, uncomplicated: Secondary | ICD-10-CM | POA: Insufficient documentation

## 2020-08-06 DIAGNOSIS — M25552 Pain in left hip: Secondary | ICD-10-CM | POA: Insufficient documentation

## 2020-08-06 DIAGNOSIS — M5136 Other intervertebral disc degeneration, lumbar region: Secondary | ICD-10-CM | POA: Insufficient documentation

## 2020-08-06 DIAGNOSIS — M5416 Radiculopathy, lumbar region: Secondary | ICD-10-CM | POA: Insufficient documentation

## 2020-08-06 MED ORDER — OXYCODONE HCL 10 MG PO TABS: 10.0000 mg | ORAL_TABLET | Freq: Four times a day (QID) | ORAL | 0 refills | Status: DC | PRN

## 2020-08-06 NOTE — Progress Notes (Signed)
Nursing Pain Medication Assessment:  Safety precautions to be maintained throughout the outpatient stay will include: orient to surroundings, keep bed in low position, maintain call bell within reach at all times, provide assistance with transfer out of bed and ambulation.  Medication Inspection Compliance: Pill count conducted under aseptic conditions, in front of the patient. Neither the pills nor the bottle was removed from the patient's sight at any time. Once count was completed pills were immediately returned to the patient in their original bottle.  Medication: Oxycodone IR Pill/Patch Count: 20 of 120 pills remain Pill/Patch Appearance: Markings consistent with prescribed medication Bottle Appearance: Standard pharmacy container. Clearly labeled. Filled Date: 73 / 22 / 2021 Last Medication intake:  Today

## 2020-08-06 NOTE — Patient Instructions (Signed)
____________________________________________________________________________________________  Drug Holidays (Slow)  What is a "Drug Holiday"? Drug Holiday: is the name given to the period of time during which a patient stops taking a medication(s) for the purpose of eliminating tolerance to the drug.  Benefits . Improved effectiveness of opioids. . Decreased opioid dose needed to achieve benefits. . Improved pain with lesser dose.  What is tolerance? Tolerance: is the progressive decreased in effectiveness of a drug due to its repetitive use. With repetitive use, the body gets use to the medication and as a consequence, it loses its effectiveness. This is a common problem seen with opioid pain medications. As a result, a larger dose of the drug is needed to achieve the same effect that used to be obtained with a smaller dose.  How long should a "Drug Holiday" last? You should stay off of the pain medicine for at least 14 consecutive days. (2 weeks)  Should I stop the medicine "cold [place]"? No. You should always coordinate with your Pain Specialist so that he/she can provide you with the correct medication dose to make the transition as smoothly as possible.  How do I stop the medicine? Slowly. You will be instructed to decrease the daily amount of pills that you take by one (1) pill every seven (7) days. This is called a "slow downward taper" of your dose. For example: if you normally take four (4) pills per day, you will be asked to drop this dose to three (3) pills per day for seven (7) days, then to two (2) pills per day for seven (7) days, then to one (1) per day for seven (7) days, and at the end of those last seven (7) days, this is when the "Drug Holiday" would start.   Will I have withdrawals? By doing a "slow downward taper" like this one, it is unlikely that you will experience any significant withdrawal symptoms. Typically, what triggers withdrawals is the sudden stop of a high  dose opioid therapy. Withdrawals can usually be avoided by slowly decreasing the dose over a prolonged period of time. If you do not follow these instructions and decide to stop your medication abruptly, withdrawals may be possible.  What are withdrawals? Withdrawals: refers to the wide range of symptoms that occur after stopping or dramatically reducing opiate drugs after heavy and prolonged use. Withdrawal symptoms do not occur to patients that use low dose opioids, or those who take the medication sporadically. Contrary to benzodiazepine (example: Valium, Xanax, etc.) or alcohol withdrawals ("Delirium Tremens"), opioid withdrawals are not lethal. Withdrawals are the physical manifestation of the body getting rid of the excess receptors.  Expected Symptoms Early symptoms of withdrawal may include: . Agitation . Anxiety . Muscle aches . Increased tearing . Insomnia . Runny nose . Sweating . Yawning  Late symptoms of withdrawal may include: . Abdominal cramping . Diarrhea . Dilated pupils . Goose bumps . Nausea . Vomiting  Will I experience withdrawals? Due to the slow nature of the taper, it is very unlikely that you will experience any.  What is a slow taper? Taper: refers to the gradual decrease in dose.  (Last update: 02/06/2020) ____________________________________________________________________________________________   ____________________________________________________________________________________________  Medication Rules  Purpose: To inform patients, and their family members, of our rules and regulations.  Applies to: All patients receiving prescriptions (written or electronic).  Pharmacy of record: Pharmacy where electronic prescriptions will be sent. If written prescriptions are taken to a different pharmacy, please inform the nursing staff. The pharmacy listed  in the electronic medical record should be the one where you would like electronic prescriptions to  be sent.  Electronic prescriptions: In compliance with the [facility] Strengthen Opioid Misuse Prevention (STOP) Act of 2017 (Session Conni Elliot [phone]), effective July 19, 2018, all controlled substances must be electronically prescribed. Calling prescriptions to the pharmacy will cease to exist.  Prescription refills: Only during scheduled appointments. Applies to all prescriptions.  NOTE: The following applies primarily to controlled substances (Opioid* Pain Medications).   Type of encounter (visit): For patients receiving controlled substances, face-to-face visits are required. (Not an option or up to the patient.)  Patient's responsibilities: 1. Pain Pills: Bring all pain pills to every appointment (except for procedure appointments). 2. Pill Bottles: Bring pills in original pharmacy bottle. Always bring the newest bottle. Bring bottle, even if empty. 3. Medication refills: You are responsible for knowing and keeping track of what medications you take and those you need refilled. The day before your appointment: write a list of all prescriptions that need to be refilled. The day of the appointment: give the list to the admitting nurse. Prescriptions will be written only during appointments. No prescriptions will be written on procedure days. If you forget a medication: it will not be "Called in", "Faxed", or "electronically sent". You will need to get another appointment to get these prescribed. No early refills. Do not call asking to have your prescription filled early. 4. Prescription Accuracy: You are responsible for carefully inspecting your prescriptions before leaving our office. Have the discharge nurse carefully go over each prescription with you, before taking them home. Make sure that your name is accurately spelled, that your address is correct. Check the name and dose of your medication to make sure it is accurate. Check the number of pills, and the written instructions to  make sure they are clear and accurate. Make sure that you are given enough medication to last until your next medication refill appointment. 5. Taking Medication: Take medication as prescribed. When it comes to controlled substances, taking less pills or less frequently than prescribed is permitted and encouraged. Never take more pills than instructed. Never take medication more frequently than prescribed.  6. Inform other Doctors: Always inform, all of your healthcare providers, of all the medications you take. 7. Pain Medication from other Providers: You are not allowed to accept any additional pain medication from any other Doctor or Healthcare provider. There are two exceptions to this rule. (see below) In the event that you require additional pain medication, you are responsible for notifying us, as stated below. 8. Cough Medicine: Often these contain an opioid, such as codeine or hydrocodone. Never accept or take cough medicine containing these opioids if you are already taking an opioid* medication. The combination may cause respiratory failure and death. 9. Medication Agreement: You are responsible for carefully reading and following our Medication Agreement. This must be signed before receiving any prescriptions from our practice. Safely store a copy of your signed Agreement. Violations to the Agreement will result in no further prescriptions. (Additional copies of our Medication Agreement are available upon request.) 10. Laws, Rules, & Regulations: All patients are expected to follow all [address] and [employer], Rules, [place] [employer]. Ignorance of the Laws does not constitute a valid excuse.  11. Illegal drugs and Controlled Substances: The use of illegal substances (including, but not limited to marijuana and its derivatives) and/or the illegal use of any controlled substances is strictly prohibited. Violation of this rule may result  in the immediate and permanent discontinuation of any  and all prescriptions being written by our practice. The use of any illegal substances is prohibited. 12. Adopted CDC guidelines & recommendations: Target dosing levels will be at or below 60 MME/day. Use of benzodiazepines** is not recommended.  Exceptions: There are only two exceptions to the rule of not receiving pain medications from other Healthcare Providers. 1. Exception #1 (Emergencies): In the event of an emergency (i.e.: accident requiring emergency care), you are allowed to receive additional pain medication. However, you are responsible for: As soon as you are able, call our office [phone], at any time of the day or night, and leave a message stating your name, the date and nature of the emergency, and the name and dose of the medication prescribed. In the event that your call is answered by a member of our staff, make sure to document and save the date, time, and the name of the person that took your information.  2. Exception #2 (Planned Surgery): In the event that you are scheduled by another doctor or dentist to have any type of surgery or procedure, you are allowed (for a period no longer than 30 days), to receive additional pain medication, for the acute post-op pain. However, in this case, you are responsible for picking up a copy of our "Post-op Pain Management for Surgeons" handout, and giving it to your surgeon or dentist. This document is available at our office, and does not require an appointment to obtain it. Simply go to our office during business hours (Monday-Thursday from 8:00 AM to 4:00 PM) (Friday 8:00 AM to 12:00 Noon) or if you have a scheduled appointment with [place], prior to your surgery, and ask for it by name. In addition, you are responsible for: calling our office [phone], at any time of the day or night, and leaving a message stating your name, name of your surgeon, type of surgery, and date of procedure or surgery. Failure to comply with your responsibilities  may result in termination of therapy involving the controlled substances.  *Opioid medications include: morphine, codeine, oxycodone, oxymorphone, hydrocodone, hydromorphone, meperidine, tramadol, tapentadol, buprenorphine, fentanyl, methadone. **Benzodiazepine medications include: diazepam (Valium), alprazolam (Xanax), clonazepam (Klonopine), lorazepam (Ativan), clorazepate (Tranxene), chlordiazepoxide (Librium), estazolam (Prosom), oxazepam (Serax), temazepam (Restoril), triazolam (Halcion) (Last updated: 06/16/2020) ____________________________________________________________________________________________   ____________________________________________________________________________________________  Medication Recommendations and Reminders  Applies to: All patients receiving prescriptions (written and/or electronic).  Medication Rules & Regulations: These rules and regulations exist for your safety and that of others. They are not flexible and neither are we. Dismissing or ignoring them will be considered "non-compliance" with medication therapy, resulting in complete and irreversible termination of such therapy. (See document titled "Medication Rules" for more details.) In all conscience, because of safety reasons, we cannot continue providing a therapy where the patient does not follow instructions.  Pharmacy of record:   Definition: This is the pharmacy where your electronic prescriptions will be sent.   We do not endorse any particular pharmacy, however, we have experienced problems with [facility] not securing enough medication supply for the community.  We do not restrict you in your choice of pharmacy. However, once we write for your prescriptions, we will NOT be re-sending more prescriptions to fix restricted supply problems created by your pharmacy, or your insurance.   The pharmacy listed in the electronic medical record should be the one where you want electronic prescriptions  to be sent.  If you choose to change pharmacy, simply  notify our nursing staff.  Recommendations:  Keep all of your pain medications in a safe place, under lock and key, even if you live alone. We will NOT replace lost, stolen, or damaged medication.  After you fill your prescription, take 1 week's worth of pills and put them away in a safe place. You should keep a separate, properly labeled bottle for this purpose. The remainder should be kept in the original bottle. Use this as your primary supply, until it runs out. Once it's gone, then you know that you have 1 week's worth of medicine, and it is time to come in for a prescription refill. If you do this correctly, it is unlikely that you will ever run out of medicine.  To make sure that the above recommendation works, it is very important that you make sure your medication refill appointments are scheduled at least 1 week before you run out of medicine. To do this in an effective manner, make sure that you do not leave the office without scheduling your next medication management appointment. Always ask the nursing staff to show you in your prescription , when your medication will be running out. Then arrange for the receptionist to get you a return appointment, at least 7 days before you run out of medicine. Do not wait until you have 1 or 2 pills left, to come in. This is very poor planning and does not take into consideration that we may need to cancel appointments due to bad weather, sickness, or emergencies affecting our staff.  DO NOT ACCEPT A "Partial Fill": If for any reason your pharmacy does not have enough pills/tablets to completely fill or refill your prescription, do not allow for a "partial fill". The law allows the pharmacy to complete that prescription within 72 hours, without requiring a new prescription. If they do not fill the rest of your prescription within those 72 hours, you will need a separate prescription to fill the  remaining amount, which we will NOT provide. If the reason for the partial fill is your insurance, you will need to talk to the pharmacist about payment alternatives for the remaining tablets, but again, DO NOT ACCEPT A PARTIAL FILL, unless you can trust your pharmacist to obtain the remainder of the pills within 72 hours.  Prescription refills and/or changes in medication(s):   Prescription refills, and/or changes in dose or medication, will be conducted only during scheduled medication management appointments. (Applies to both, written and electronic prescriptions.)  No refills on procedure days. No medication will be changed or started on procedure days. No changes, adjustments, and/or refills will be conducted on a procedure day. Doing so will interfere with the diagnostic portion of the procedure.  No phone refills. No medications will be "called into the pharmacy".  No Fax refills.  No weekend refills.  No Holliday refills.  No after hours refills.  Remember:  Business hours are:  Monday to Thursday 8:00 AM to 4:00 PM Provider's Schedule: Delano Metz, MD - Appointments are:  Medication management: Monday and Wednesday 8:00 AM to 4:00 PM Procedure day: Tuesday and Thursday 7:30 AM to 4:00 PM Edward Jolly, MD - Appointments are:  Medication management: Tuesday and Thursday 8:00 AM to 4:00 PM Procedure day: Monday and Wednesday 7:30 AM to 4:00 PM (Last update: 02/06/2020) ____________________________________________________________________________________________

## 2020-10-27 NOTE — Progress Notes (Signed)
PROVIDER NOTE: Information contained herein reflects review and annotations entered in association with encounter. Interpretation of such information and data should be left to medically-trained personnel. Information provided to patient can be located elsewhere in the medical record under "Patient Instructions". Document created using STT-dictation technology, any transcriptional errors that may result from process are unintentional.    Patient: Nathan Lambert.  Service Category: E/M  Provider: Gaspar Cola, MD  DOB: 04/15/1981  DOS: 10/29/2020  Specialty: Interventional Pain Management  MRN: [id number]  Setting: Ambulatory outpatient  PCP: Vidal Schwalbe, MD  Type: Established Patient    Referring Provider: Vidal Schwalbe, MD  Location: Office  Delivery: Face-to-face     HPI  [name], a 40 y.o. year old male, is here today because of his Chronic pain syndrome [G89.4]. Mr. Wageman primary complain today is Back Pain Last encounter: My last encounter with him was on 08/06/2020. Pertinent problems: Mr. 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); Chronic lower extremity pain (Bilateral) (L>R); Lumbar facet hypertrophy (Bilateral); Lumbar facet syndrome (Bilateral) (R>L); Neurogenic pain; Spondylosis without myelopathy or radiculopathy, lumbar region; Other specified dorsopathies, sacral and sacrococcygeal region; Lumbar spondylosis; Chronic upper extremity pain (Left); Pain and numbness of left upper extremity; Cervical radiculitis (C6/C7) (Left); DDD  (degenerative disc disease), cervical; Cervicalgia; Osteoarthritis involving multiple joints; Chronic hip pain (Bilateral) (L>R); Greater trochanteric bursitis (Left); Lumbar back pain with radiculopathy affecting left lower extremity (L5 dermatomal distribution); Subacute lumbar radiculopathy (L5) (Left); Chronic low back pain (Bilateral) w/o sciatica; and Decreased range of motion of both hips on their pertinent problem list. Pain Assessment: Severity of Chronic pain is reported as a 3 /10. Location: Back  /pain radiaties down left leg. leg come numb and need his hands to pick up his leg, started about 3 months ago.. Onset: More than a month ago. Quality: Sharp,Shooting,Stabbing,Constant,Burning,Aching,Heaviness. Timing: Constant. Modifying factor(s): meds, heat and sitting down. Vitals:  height is 5' 11"  (1.803 m) and weight is 270 lb (122.5 kg). His temperature is 97.4 F (36.3 C) (abnormal). His blood pressure is 128/77 and his pulse is 81. His oxygen saturation is 99%.   Reason for encounter: medication management.   The patient indicates doing well with the current medication regimen. No adverse reactions or side effects reported to the medications.   In addition, the patient indicates that the last time that he was here he was experiencing some pain and discomfort in the left lower extremity where he was having some difficulty moving that leg.  Further questioning reveals that the decreased range of motion and weakness seems to be at the level obturator hip joint.  Provocative physical exam using the Patrick maneuver reveals bilateral hip joint arthralgia and bilateral decreased range of motion with the left side being worse than the right.  This seems to be relatively new for this patient and therefore we will be ordering x-rays of his hips.  He refers that more than pain he has limited range of motion and weakness that may be secondary to guarding.  The plan is to order x-rays of both of his hips  and evaluate to see if there is any evidence of degenerative joint disease.  If there is no evidence of that, then he may be having an enthesopathy of the hip joints and we may consider doing therapeutic injections in the area.  If there is no improvement, will follow up with an MRI.  He is overweight and therefore there is a possibility that this could be an osteoarthritis of the hip joint.   Today he has requested referrals to bariatric surgery and weight management clinic since he wants to bring down his weight.  He is currently 270 pounds with a BMI of 37.66 kg/m.  RTCB: 02/04/2021 Nonopioids transferred 05/07/2020: Magnesium, vitamin D3, and calcium  Pharmacotherapy Assessment   Analgesic: Oxycodone IR 10 mg 4 times daily MME/day: 84m/day.   Monitoring: [place] PMP: PDMP reviewed during this encounter.       Pharmacotherapy: No side-effects or adverse reactions reported. Compliance: No problems identified. Effectiveness: Clinically acceptable.  BChauncey Fischer RN  10/29/2020  1:07 PM  Sign when Signing Visit Nursing Pain Medication Assessment:  Safety precautions to be maintained throughout the outpatient stay will include: orient to surroundings, keep bed in low position, maintain call bell within reach at all times, provide assistance with transfer out of bed and ambulation.  Medication Inspection Compliance: Pill count conducted under aseptic conditions, in front of the patient. Neither the pills nor the bottle was removed from the patient's sight at any time. Once count was completed pills were immediately returned to the patient in their original bottle.  Medication: Oxycodone IR Pill/Patch Count: 38 of 120 pills remain Pill/Patch Appearance: Markings consistent with prescribed medication Bottle Appearance: Standard pharmacy container. Clearly labeled. Filled Date: 3 / 279/ 22 Last Medication intake:  TodaySafety precautions to be maintained throughout the outpatient stay will  include: orient to surroundings, keep bed in low position, maintain call bell within reach at all times, provide assistance with transfer out of bed and ambulation.     UDS:  Summary  Date Value Ref Range Status  11/15/2019 Note  Final    Comment:    ==================================================================== [facility] (MW) ==================================================================== Test                             Result       Flag       Units Drug Present and Declared for Prescription Verification   Oxycodone                      1444         EXPECTED   ng/mg creat   Oxymorphone                    2592         EXPECTED   ng/mg creat   Noroxycodone                   1773         EXPECTED   ng/mg creat   Noroxymorphone                 717          EXPECTED   ng/mg creat    Sources of oxycodone are scheduled prescription medications.    Oxymorphone, noroxycodone, and noroxymorphone are expected    metabolites of oxycodone. Oxymorphone is also available as a    scheduled prescription medication. ==================================================================== Test  Result    Flag   Units      Ref Range   Creatinine              196              mg/dL      >=20 ==================================================================== Declared Medications:  The flagging and interpretation on this report are based on the  following declared medications.  Unexpected results may arise from  inaccuracies in the declared medications.  **Note: The testing scope of this panel includes these medications:  Oxycodone  **Note: The testing scope of this panel does not include the  following reported medications:  Calcium  Cholecalciferol  Loratadine  Magnesium (Mag-Ox)  Meloxicam  Metformin  Montelukast  Naloxone  Vitamin D ==================================================================== For clinical consultation, please call [phone]. ====================================================================      ROS  Constitutional: Denies any fever or chills Gastrointestinal: No reported hemesis, hematochezia, vomiting, or acute GI distress Musculoskeletal: Denies any acute onset joint swelling, redness, loss of ROM, or weakness Neurological: No reported episodes of acute onset apraxia, aphasia, dysarthria, agnosia, amnesia, paralysis, loss of coordination, or loss of consciousness  Medication Review  GNP Calcium 1200, Magnesium, Oxycodone HCl, Vitamin D3, azelastine, calcium carbonate, clobetasol ointment, doxycycline, econazole nitrate, fluticasone, gentamicin cream, loratadine, metFORMIN, montelukast, naloxone, and terbinafine  History Review  Allergy: Mr. Brinkley is allergic to bactrim [sulfamethoxazole-trimethoprim], pollen extract, and sulfa antibiotics. Drug: [name]  reports no history of drug use. Alcohol:  reports no history of alcohol use. Tobacco:  reports that he has been smoking cigarettes. He has a 10.00 pack-year smoking history. He has never used smokeless tobacco. Social: Mr. Molina  reports that he has been smoking cigarettes. He has a 10.00 pack-year smoking history. He has never used smokeless tobacco. He reports that he does not drink alcohol and does not use drugs. Medical:  has a past medical history of Allergy (2012), Chronic low back pain (Primary Area of Pain) (Bilateral) (R>L), Degeneration of lumbar or lumbosacral intervertebral disc (L4-L5) (12/27/2016), Headache, and Opiate use (02/15/2017). Surgical: Mr. Hinshaw  has a past surgical history that includes Fracture surgery (Left); Spine surgery (Right, 06/16/2015); and Back surgery. Family: family history includes AAA (abdominal aortic aneurysm) in his mother; Diabetes in his father; Stroke in his mother.  Laboratory Chemistry Profile   Renal Lab Results  Component Value Date   BUN 10 06/07/2019   CREATININE 0.76  06/07/2019   BCR 7 (L) 12/27/2016   GFRAA >60 06/07/2019   GFRNONAA >60 06/07/2019     Hepatic Lab Results  Component Value Date   AST 14 (L) 06/07/2019   ALT 17 06/07/2019   ALBUMIN 4.0 06/07/2019   ALKPHOS 50 06/07/2019     Electrolytes Lab Results  Component Value Date   NA 138 06/07/2019   K 4.3 06/07/2019   CL 104 06/07/2019   CALCIUM 9.0 06/07/2019   MG 2.0 06/07/2019     Bone Lab Results  Component Value Date   VD25OH 15.51 (L) 06/07/2019   25OHVITD1 17 (L) 12/27/2016   25OHVITD2 <1.0 12/27/2016   25OHVITD3 17 12/27/2016   TESTOFREE 2.6 (L) 06/08/2017   TESTOSTERONE 116 (L) 06/08/2017     Inflammation (CRP: Acute Phase) (ESR: Chronic Phase) Lab Results  Component Value Date   CRP 2.7 (H) 06/07/2019   ESRSEDRATE 17 (H) 06/07/2019       Note: Above Lab results reviewed.  Recent Imaging Review  DG PAIN CLINIC C-ARM 1-60 MIN NO  REPORT Fluoro was used, but no Radiologist interpretation will be provided.  Please refer to "NOTES" tab for provider progress note. Note: Reviewed        Physical Exam  General appearance: Well nourished, well developed, and well hydrated. In no apparent acute distress Mental status: Alert, oriented x 3 (person, place, & time)       Respiratory: No evidence of acute respiratory distress Eyes: PERLA Vitals: BP 128/77   Pulse 81   Temp (!) 97.4 F (36.3 C)   Ht 5' 11"  (1.803 m)   Wt 270 lb (122.5 kg)   SpO2 99%   BMI 37.66 kg/m  BMI: Estimated body mass index is 37.66 kg/m as calculated from the following:   Height as of this encounter: 5' 11"  (1.803 m).   Weight as of this encounter: 270 lb (122.5 kg). Ideal: Ideal body weight: 75.3 kg (166 lb 0.1 oz) Adjusted ideal body weight: 94.2 kg (207 lb 9.7 oz)  Assessment   Status Diagnosis  Controlled Controlled Controlled 1. Chronic pain syndrome   2. Lumbar facet syndrome (Bilateral) (R>L)   3. Chronic low back pain (Bilateral) w/o sciatica   4. DDD (degenerative disc  disease), lumbar   5. Lumbar back pain with radiculopathy affecting left lower extremity (L5 dermatomal distribution)   6. Pharmacologic therapy   7. Chronic use of opiate for therapeutic purpose   8. Uncomplicated opioid dependence ([place])   9. Decreased range of motion of both hips   10. Chronic hip pain (Bilateral) (L>R)   11. Severe obesity (BMI 35.0-39.9) with comorbidity ([place])      Updated Problems: Problem  Decreased Range of Motion of Both Hips  Chronic hip pain (Bilateral) (L>R)  Severe Obesity (Bmi 35.0-39.9) With Comorbidity ([facility])    Plan of Care  Problem-specific:  No problem-specific Assessment & Plan notes found for this encounter.  Mr. Remberto Lienhard. has a current medication list which includes the following long-term medication(s): azelastine, fluticasone, loratadine, metformin, montelukast, naloxone, calcium carbonate, gnp calcium 1200, vitamin d3, magnesium, [START ON 11/06/2020] oxycodone hcl, [START ON 12/06/2020] oxycodone hcl, and [START ON 01/05/2021] oxycodone hcl.  Pharmacotherapy (Medications Ordered): Meds ordered this encounter  Medications  . Oxycodone HCl 10 MG TABS    Sig: Take 1 tablet (10 mg total) by mouth every 6 (six) hours as needed. Must last 30 days    Dispense:  120 tablet    Refill:  0    Not a duplicate. Do NOT delete! Dispense 1 day early if closed on refill date. Avoid benzodiazepines within 8 hours of opioids. Do not send refill requests.  . Oxycodone HCl 10 MG TABS    Sig: Take 1 tablet (10 mg total) by mouth every 6 (six) hours as needed. Must last 30 days    Dispense:  120 tablet    Refill:  0    Not a duplicate. Do NOT delete! Dispense 1 day early if closed on refill date. Avoid benzodiazepines within 8 hours of opioids. Do not send refill requests.  . Oxycodone HCl 10 MG TABS    Sig: Take 1 tablet (10 mg total) by mouth every 6 (six) hours as needed. Must last 30 days    Dispense:  120 tablet    Refill:  0    Not a  duplicate. Do NOT delete! Dispense 1 day early if closed on refill date. Avoid benzodiazepines within 8 hours of opioids. Do not send refill requests.   Orders:  Orders  Placed This Encounter  Procedures  . DG HIP UNILAT W OR W/O PELVIS 2-3 VIEWS RIGHT    Please describe any evidence of DJD, such as joint narrowing, asymmetry, cysts, or any anomalies in bone density, production, or erosion.    Standing Status:   Future    Standing Expiration Date:   11/28/2020    Scheduling Instructions:     Imaging must be done as soon as possible. Inform patient that order will expire within 30 days and I will not renew it.    Order Specific Question:   Reason for Exam (SYMPTOM  OR DIAGNOSIS REQUIRED)    Answer:   Right hip pain/arthralgia    Order Specific Question:   Preferred imaging location?    Answer:   [facility]    Order Specific Question:   Call Results- Best Contact Number?    Answer:   [phone] ([facility])    Order Specific Question:   Release to patient    Answer:   Immediate  . DG HIP UNILAT W OR W/O PELVIS 2-3 VIEWS LEFT    Please describe any evidence of DJD, such as joint narrowing, asymmetry, cysts, or any anomalies in bone density, production, or erosion.    Standing Status:   Future    Standing Expiration Date:   11/28/2020    Scheduling Instructions:     Imaging must be done as soon as possible. Inform patient that order will expire within 30 days and I will not renew it.    Order Specific Question:   Reason for Exam (SYMPTOM  OR DIAGNOSIS REQUIRED)    Answer:   Right hip pain/arthralgia    Order Specific Question:   Preferred imaging location?    Answer:   [facility]    Order Specific Question:   Call Results- Best Contact Number?    Answer:   [phone] [facility])  . Amb Ref to Medical Weight Management    Referral Priority:   Routine    Referral Type:   Consultation    Referral Reason:   Specialty Services Required    Number of Visits  Requested:   1  . Amb Referral to Bariatric Surgery    Referral Priority:   Routine    Referral Type:   Consultation    Referral Reason:   Specialty Services Required    Number of Visits Requested:   1   Follow-up plan:   Return in about 2 weeks (around 11/12/2020) for on afternoon of evaluation day, (VV) follow-up hip x-ray.      Considering:   Diagnostic left SI joint block Possible left SI joint RFA Diagnostic caudal ESI + diagnostic epidurogram Possible Racz procedure Diagnostic left IA knee injection (w/ steroid) Possible left IA Hyalgan knee injections Diagnostic left Genicular NB Possible left Genicular nerve RFA   Palliative PRN treatment(s):   Palliative bilateral lumbar facet block #4 Palliative right SI joint block  Palliative right lumbar facet + sacroiliac joint RFA #2(last one done on 10/11/2017) Palliative left lumbar facet RFA #2(last one done on 11/24/2017)  Diagnostic right L4 TFES2 #1under fluoro and IV sedation.      Recent Visits Date Type Provider Dept  08/06/20 Office Visit Milinda Pointer, MD [facility]  Showing recent visits within past 90 days and meeting all other requirements Today's Visits Date Type Provider Dept  10/29/20 Office Visit Milinda Pointer, MD [facility]  Showing today's visits and meeting all other requirements Future Appointments  Date Type Provider Dept  11/12/20 Appointment Milinda Pointer, MD [facility]  01/05/21 Appointment Milinda Pointer, MD [facility]  Showing future appointments within next 90 days and meeting all other requirements  I discussed the assessment and treatment plan with the patient. The patient was provided an opportunity to ask questions and all were answered. The patient agreed with the plan and demonstrated an understanding of the instructions.  Patient advised to call back or seek an in-person evaluation if the symptoms or condition  worsens.  Duration of encounter: 30 minutes.  Note by: Gaspar Cola, MD Date: 10/29/2020; Time: 1:35 PM

## 2020-10-28 DIAGNOSIS — Z79891 Long term (current) use of opiate analgesic: Secondary | ICD-10-CM | POA: Insufficient documentation

## 2020-10-29 ENCOUNTER — Ambulatory Visit
Admission: RE | Admit: 2020-10-29 | Discharge: 2020-10-29 | Disposition: A | Discharge status: Home / Self Care | Payer: BLUE CROSS/BLUE SHIELD | Source: Ambulatory Visit | Attending: Pain Medicine | Admitting: Pain Medicine

## 2020-10-29 ENCOUNTER — Ambulatory Visit (HOSPITAL_BASED_OUTPATIENT_CLINIC_OR_DEPARTMENT_OTHER): Payer: BLUE CROSS/BLUE SHIELD | Admitting: Pain Medicine

## 2020-10-29 ENCOUNTER — Other Ambulatory Visit: Payer: Self-pay

## 2020-10-29 ENCOUNTER — Encounter: Payer: Self-pay | Admitting: Pain Medicine

## 2020-10-29 ENCOUNTER — Ambulatory Visit
Admission: RE | Admit: 2020-10-29 | Discharge: 2020-10-29 | Disposition: A | Discharge status: Home / Self Care | Payer: BLUE CROSS/BLUE SHIELD | Attending: Pain Medicine | Admitting: Pain Medicine

## 2020-10-29 VITALS — BP 128/77 | HR 81 | Temp 97.4°F | Ht 71.0 in | Wt 270.0 lb

## 2020-10-29 DIAGNOSIS — M25551 Pain in right hip: Secondary | ICD-10-CM

## 2020-10-29 DIAGNOSIS — M25652 Stiffness of left hip, not elsewhere classified: Secondary | ICD-10-CM

## 2020-10-29 DIAGNOSIS — M25552 Pain in left hip: Secondary | ICD-10-CM | POA: Insufficient documentation

## 2020-10-29 DIAGNOSIS — F1120 Opioid dependence, uncomplicated: Secondary | ICD-10-CM

## 2020-10-29 DIAGNOSIS — M25651 Stiffness of right hip, not elsewhere classified: Secondary | ICD-10-CM | POA: Insufficient documentation

## 2020-10-29 DIAGNOSIS — Z79891 Long term (current) use of opiate analgesic: Secondary | ICD-10-CM | POA: Insufficient documentation

## 2020-10-29 DIAGNOSIS — M5136 Other intervertebral disc degeneration, lumbar region: Secondary | ICD-10-CM | POA: Insufficient documentation

## 2020-10-29 DIAGNOSIS — Z79899 Other long term (current) drug therapy: Secondary | ICD-10-CM

## 2020-10-29 DIAGNOSIS — M545 Low back pain, unspecified: Secondary | ICD-10-CM

## 2020-10-29 DIAGNOSIS — M5416 Radiculopathy, lumbar region: Secondary | ICD-10-CM

## 2020-10-29 DIAGNOSIS — G8929 Other chronic pain: Secondary | ICD-10-CM | POA: Insufficient documentation

## 2020-10-29 DIAGNOSIS — G894 Chronic pain syndrome: Secondary | ICD-10-CM | POA: Diagnosis not present

## 2020-10-29 DIAGNOSIS — M47816 Spondylosis without myelopathy or radiculopathy, lumbar region: Secondary | ICD-10-CM | POA: Insufficient documentation

## 2020-10-29 MED ORDER — OXYCODONE HCL 10 MG PO TABS: 10.0000 mg | ORAL_TABLET | Freq: Four times a day (QID) | ORAL | 0 refills | Status: DC | PRN

## 2020-10-29 NOTE — Progress Notes (Signed)
Nursing Pain Medication Assessment:  Safety precautions to be maintained throughout the outpatient stay will include: orient to surroundings, keep bed in low position, maintain call bell within reach at all times, provide assistance with transfer out of bed and ambulation.  Medication Inspection Compliance: Pill count conducted under aseptic conditions, in front of the patient. Neither the pills nor the bottle was removed from the patient's sight at any time. Once count was completed pills were immediately returned to the patient in their original bottle.  Medication: Oxycodone IR Pill/Patch Count: 38 of 120 pills remain Pill/Patch Appearance: Markings consistent with prescribed medication Bottle Appearance: Standard pharmacy container. Clearly labeled. Filled Date: 3 / 46 / 22 Last Medication intake:  TodaySafety precautions to be maintained throughout the outpatient stay will include: orient to surroundings, keep bed in low position, maintain call bell within reach at all times, provide assistance with transfer out of bed and ambulation.

## 2020-10-29 NOTE — Patient Instructions (Addendum)
____________________________________________________________________________________________  Virtual Visits   What is a "Virtual Visit"? It is a Metallurgist (medical visit) that takes place on real time (NOT TEXT or E-MAIL) over the telephone or computer device (desktop, laptop, tablet, smart phone, etc.). It allows for more location flexibility between the patient and the healthcare provider.  Who decides when these types of visits will be used? The physician.  Who is eligible for these types of visits? Only those patients that can be reliably reached over the telephone.  What do you mean by reliably? We do not have time to call everyone multiple times, therefore those that tend to screen calls and then call back later are not suitable candidates for this system. We understand how people are reluctant to pickup on "unknown" calls, therefore, we suggest adding our telephone numbers to your list of "CONTACT(s)". This way, you should be able to readily identify our calls when you receive one. All of our numbers are available below.   Who is not eligible? This option is not available for medication management encounters, specially for controlled substances. Patients on pain medications that fall under the category of controlled substances have to come in for "Face-to-Face" encounters. This is required for mandatory monitoring of these substances. You may be asked to provide a sample for an unannounced urine drug screening test (UDS), and we will need to count your pain pills. Not bringing your pills to be counted may result in no refill. Obviously, neither one of these can be done over the phone.  When will this type of visits be used? You can request a virtual visit whenever you are physically unable to attend a regular appointment. The decision will be made by the physician (or healthcare provider) on a case by case basis.   At what time will I be called? This is an  excellent question. The providers will try to call you whenever they have time available. Do not expect to be called at any specific time. The secretaries will assign you a time for your virtual visit appointment, but this is done simply to keep a list of those patients that need to be called, but not for the purpose of keeping a time schedule. Be advised that the call may come in anytime during the day, between the hours of 8:00 AM and 8::00 PM, depending on provider availability. We do understand that the system is not perfect. If you are unable to be available that day on a moments notice, then request an "in-person" appointment rather than a "virtual visit".  Can I request my medication visits to be "Virtual"? Yes you may request it, but the decision is entirely up to the healthcare provider. Control substances require specific monitoring that requires Face-to-Face encounters. The number of encounters  and the extent of the monitoring is determined on a case by case basis.  Add a new contact to your smart phone and label it "PAIN CLINIC" Under this contact add the following numbers: Main: [phone] (Official Contact Number) Nurses: [phone] (These are outgoing only calling systems. Do not call this number.) Dr. Dossie Arbour: [phone] or [phone] (Outgoing calls only. Do not call this number.)  ____________________________________________________________________________________________   ____________________________________________________________________________________________  Preparing for Procedure with Sedation  Procedure appointments are limited to planned procedures: . No Prescription Refills. . No disability issues will be discussed. . No medication changes will be discussed.  Instructions: . Oral Intake: Do not eat or drink anything for at least 8 hours  prior to your procedure. (Exception: Blood Pressure Medication. See below.) . Transportation: Unless  otherwise stated by your physician, you may drive yourself after the procedure. . Blood Pressure Medicine: Do not forget to take your blood pressure medicine with a sip of water the morning of the procedure. If your Diastolic (lower reading)is above 100 mmHg, elective cases will be cancelled/rescheduled. . Blood thinners: These will need to be stopped for procedures. Notify our staff if you are taking any blood thinners. Depending on which one you take, there will be specific instructions on how and when to stop it. . Diabetics on insulin: Notify the staff so that you can be scheduled 1st case in the morning. If your diabetes requires high dose insulin, take only  of your normal insulin dose the morning of the procedure and notify the staff that you have done so. . Preventing infections: Shower with an antibacterial soap the morning of your procedure. . Build-up your immune system: Take 1000 mg of Vitamin C with every meal (3 times a day) the day prior to your procedure. Marland Kitchen Antibiotics: Inform the staff if you have a condition or reason that requires you to take antibiotics before dental procedures. . Pregnancy: If you are pregnant, call and cancel the procedure. . Sickness: If you have a cold, fever, or any active infections, call and cancel the procedure. . Arrival: You must be in the facility at least 30 minutes prior to your scheduled procedure. . Children: Do not bring children with you. . Dress appropriately: Bring dark clothing that you would not mind if they get stained. . Valuables: Do not bring any jewelry or valuables.  Reasons to call and reschedule or cancel your procedure: (Following these recommendations will minimize the risk of a serious complication.) . Surgeries: Avoid having procedures within 2 weeks of any surgery. (Avoid for 2 weeks before or after any surgery). . Flu Shots: Avoid having procedures within 2 weeks of a flu shots or . (Avoid for 2 weeks before or after  immunizations). . Barium: Avoid having a procedure within 7-10 days after having had a radiological study involving the use of radiological contrast. (Myelograms, Barium swallow or enema study). . Heart attacks: Avoid any elective procedures or surgeries for the initial 6 months after a "Myocardial Infarction" (Heart Attack). . Blood thinners: It is imperative that you stop these medications before procedures. Let us know if you if you take any blood thinner.  . Infection: Avoid procedures during or within two weeks of an infection (including chest colds or gastrointestinal problems). Symptoms associated with infections include: Localized redness, fever, chills, night sweats or profuse sweating, burning sensation when voiding, cough, congestion, stuffiness, runny nose, sore throat, diarrhea, nausea, vomiting, cold or Flu symptoms, recent or current infections. It is specially important if the infection is over the area that we intend to treat. Marland Kitchen Heart and lung problems: Symptoms that may suggest an active cardiopulmonary problem include: cough, chest pain, breathing difficulties or shortness of breath, dizziness, ankle swelling, uncontrolled high or unusually low blood pressure, and/or palpitations. If you are experiencing any of these symptoms, cancel your procedure and contact your primary care physician for an evaluation.  Remember:  Regular Business hours are:  Monday to Thursday 8:00 AM to 4:00 PM  Provider's Schedule: Delano Metz, MD:  Procedure days: Tuesday and Thursday 7:30 AM to 4:00 PM  Edward Jolly, MD:  Procedure days: Monday and Wednesday 7:30 AM to 4:00 PM ____________________________________________________________________________________________

## 2020-11-11 NOTE — Progress Notes (Signed)
Patient: Nathan Lambert.  Service Category: E/M  Provider: Gaspar Cola, MD  DOB: 27-Jun-1981  DOS: 11/12/2020  Location: Office  MRN: [id number]  Setting: Ambulatory outpatient  Referring Provider: Vidal Schwalbe, MD  Type: Established Patient  Specialty: Interventional Pain Management  PCP: Nathan Schwalbe, MD  Location: Remote location  Delivery: TeleHealth     Virtual Encounter - Pain Management PROVIDER NOTE: Information contained herein reflects review and annotations entered in association with encounter. Interpretation of such information and data should be left to medically-trained personnel. Information provided to patient can be located elsewhere in the medical record under "Patient Instructions". Document created using STT-dictation technology, any transcriptional errors that may result from process are unintentional.    Contact & Pharmacy Preferred: [phone] Home: [phone] (home) Mobile: [phone] (mobile) E-mail: [email or web address]  Nathan Lambert, Nathan Lambert [address] [place] [address] Phone: [phone] Fax: [phone]   Pre-screening  Nathan Lambert offered "in-person" vs "virtual" encounter. He indicated preferring virtual for this encounter.   Reason COVID-19*  Social distancing based on CDC and AMA recommendations.   I contacted Nathan Lambert. on 11/12/2020 via telephone.      I clearly identified myself as Gaspar Cola, MD. I verified that I was speaking with the correct person using two identifiers (Name: Nathan Lambert., and date of birth: 1981-06-04).  Consent I sought verbal advanced consent from Nathan Lambert. for virtual visit interactions. I informed Nathan Lambert of possible security and privacy concerns, risks, and limitations associated with providing "not-in-person" medical evaluation and management services. I also informed Nathan Lambert  of the availability of "in-person" appointments. Finally, I informed him that there would be a charge for the virtual visit and that he could be  personally, fully or partially, financially responsible for it. Nathan Lambert expressed understanding and agreed to proceed.   Historic Elements   [name] is a 40 y.o. year old, male patient evaluated today after our last contact on 10/29/2020. 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 He also  has a past surgical history that includes Fracture surgery (Left); Spine surgery (Right, 06/16/2015); and Back surgery. Nathan Lambert has a current medication list which includes the following prescription(s): azelastine, clobetasol ointment, doxycycline, econazole nitrate, fluticasone, gentamicin cream, loratadine, metformin, montelukast, naloxone, oxycodone hcl, [START ON 12/06/2020] oxycodone hcl, [START ON 01/05/2021] oxycodone hcl, terbinafine, calcium carbonate, gnp calcium 1200, vitamin d3, and magnesium. He  reports that he has been smoking cigarettes. He has a 10.00 pack-year smoking history. He has never used smokeless tobacco. He reports that he does not drink alcohol and does not use drugs. Nathan Lambert is allergic to bactrim [sulfamethoxazole-trimethoprim], pollen extract, and sulfa antibiotics.   HPI  Today, he is being contacted for follow-up evaluation after hip x-rays.  According to the diagnostic imaging, he does not seem to have any acute problems in either hip.  There is no evidence of hip fracture or dislocation.  There is no evidence of arthropathy or other focal bone abnormalities.  The x-rays were interpreted as negative.  The results were read to the patient and he indicates that currently he is having pain primarily in the  area of the left hip.  He says that putting pressure over the trochanteric  bursa does not really trigger the pain but moving from side to side does.  Because the x-rays do not rule out the possibility of soft tissue problems, I have offered the patient a diagnostic left intra-articular hip joint injection.  He has indicated that he is in fact interested in doing that since he is having quite a bit of pain in the hip and going down the lateral aspect of the leg to about the level of the knee.  Pharmacotherapy Assessment  Analgesic: Oxycodone IR 10 mg 4 times daily MME/day: 70m/day.   Monitoring: [place] PMP: PDMP reviewed during this encounter.       Pharmacotherapy: No side-effects or adverse reactions reported. Compliance: No problems identified. Effectiveness: Clinically acceptable. Plan: Refer to "POC".  UDS:  Summary  Date Value Ref Range Status  11/15/2019 Note  Final    Comment:    ==================================================================== [facility] Select 13 (MW) ==================================================================== Test                             Result       Flag       Units Drug Present and Declared for Prescription Verification   Oxycodone                      1444         EXPECTED   ng/mg creat   Oxymorphone                    2592         EXPECTED   ng/mg creat   Noroxycodone                   1773         EXPECTED   ng/mg creat   Noroxymorphone                 717          EXPECTED   ng/mg creat    Sources of oxycodone are scheduled prescription medications.    Oxymorphone, noroxycodone, and noroxymorphone are expected    metabolites of oxycodone. Oxymorphone is also available as a    scheduled prescription medication. ==================================================================== Test                      Result    Flag   Units      Ref Range   Creatinine              196              mg/dL       >=20 ==================================================================== Declared Medications:  The flagging and interpretation on this report are based on the  following declared medications.  Unexpected results may arise from  inaccuracies in the declared medications.  **Note: The testing scope of this panel includes these medications:  Oxycodone  **Note: The testing scope of this panel does not include the  following reported medications:  Calcium  Cholecalciferol  Loratadine  Magnesium (Mag-Ox)  Meloxicam  Metformin  Montelukast  Naloxone  Vitamin D ==================================================================== For clinical consultation, please call [phone] ====================================================================     Laboratory Chemistry Profile   Renal Lab Results  Component Value Date   BUN 10 06/07/2019   CREATININE 0.76 06/07/2019   BCR 7 (L) 12/27/2016   GFRAA >60 06/07/2019   GFRNONAA >60 06/07/2019     Hepatic  Lab Results  Component Value Date   AST 14 (L) 06/07/2019   ALT 17 06/07/2019   ALBUMIN 4.0 06/07/2019   ALKPHOS 50 06/07/2019     Electrolytes Lab Results  Component Value Date   NA 138 06/07/2019   K 4.3 06/07/2019   CL 104 06/07/2019   CALCIUM 9.0 06/07/2019   MG 2.0 06/07/2019     Bone Lab Results  Component Value Date   VD25OH 15.51 (L) 06/07/2019   25OHVITD1 17 (L) 12/27/2016   25OHVITD2 <1.0 12/27/2016   25OHVITD3 17 12/27/2016   TESTOFREE 2.6 (L) 06/08/2017   TESTOSTERONE 116 (L) 06/08/2017     Inflammation (CRP: Acute Phase) (ESR: Chronic Phase) Lab Results  Component Value Date   CRP 2.7 (H) 06/07/2019   ESRSEDRATE 17 (H) 06/07/2019       Note: Above Lab results reviewed.  Imaging  DG HIP UNILAT W OR W/O PELVIS 2-3 VIEWS RIGHT CLINICAL DATA:  Right hip pain.  EXAM: DG HIP (WITH OR WITHOUT PELVIS) 2-3V RIGHT  COMPARISON:  April 11, 2015  FINDINGS: There is no evidence of hip  fracture or dislocation. There is no evidence of arthropathy or other focal bone abnormality.  IMPRESSION: Negative.  Electronically Signed   By: Virgina Norfolk M.D.   On: 10/31/2020 03:57  Assessment  The primary encounter diagnosis was Chronic hip pain (Left). Diagnoses of Enthesopathy of hip region (Left) and Other bursitis of hip (Left) were also pertinent to this visit.  Plan of Care  Problem-specific:  No problem-specific Assessment & Plan notes found for this encounter.  Mr. Keanon Bevins. has a current medication list which includes the following long-term medication(s): azelastine, fluticasone, loratadine, metformin, montelukast, naloxone, oxycodone hcl, [START ON 12/06/2020] oxycodone hcl, [START ON 01/05/2021] oxycodone hcl, calcium carbonate, gnp calcium 1200, vitamin d3, and magnesium.  Pharmacotherapy (Medications Ordered): No orders of the defined types were placed in this encounter.  Orders:  Orders Placed This Encounter  Procedures  . HIP INJECTION    Standing Status:   Future    Standing Expiration Date:   02/11/2021    Scheduling Instructions:     Side: Left-sided     Sedation: No Sedation.     Timeframe: As soon as schedule allows   Follow-up plan:   Return for Procedure (no sedation): (L) IA Hip + Bursa inj. #1.      Interventional Therapies  Risk  Complexity Considerations:   Estimated body mass index is 37.66 kg/m as calculated from the following:   Height as of 10/29/20: 5' 11"  (1.803 m).   Weight as of 10/29/20: 270 lb (122.5 kg). WNL   Planned  Pending:   Diagnostic left IA hip joint + bursa injection #1    Under consideration:   Diagnostic left SI joint block Possible left SI joint RFA Diagnostic caudal ESI + diagnostic epidurogram Possible Racz procedure Diagnostic left IA knee injection (w/ steroid) Possible left IA Hyalgan knee injections Diagnostic left Genicular NB Possible left Genicular nerve RFA   Completed:    Therapeutic right lumbar facet MBB x3 (05/15/2020)  Therapeutic left lumbar facet MBB x3 (05/15/2020)  Therapeutic right lumbar facet RFA x1 (10/11/2017)  Therapeutic left lumbar facet RFA x1 (11/24/2017)  Therapeutic right SI joint block x1 (05/19/2017)  Therapeutic left SI joint block x1 (05/19/2017)  Therapeutic right SI joint RFA x1 (10/11/2017)  Therapeutic right L4 TFES2 x1(05/30/2018)    Therapeutic  Palliative (PRN) options:   Palliative bilateral lumbar facet block #  4 Palliative bilateral SI joint block #2  Diagnostic right L4 TFES2 #2    Recent Visits Date Type Provider Dept  10/29/20 Office Visit Milinda Pointer, MD [facility]  Showing recent visits within past 90 days and meeting all other requirements Today's Visits Date Type Provider Dept  11/12/20 Telemedicine Milinda Pointer, MD [facility]  Showing today's visits and meeting all other requirements Future Appointments Date Type Provider Dept  01/05/21 Appointment Milinda Pointer, MD [facility]  Showing future appointments within next 90 days and meeting all other requirements  I discussed the assessment and treatment plan with the patient. The patient was provided an opportunity to ask questions and all were answered. The patient agreed with the plan and demonstrated an understanding of the instructions.  Patient advised to call back or seek an in-person evaluation if the symptoms or condition worsens.  Duration of encounter: 16 minutes.  Note by: Gaspar Cola, MD Date: 11/12/2020; Time: 6:54 PM

## 2020-11-12 ENCOUNTER — Other Ambulatory Visit: Payer: Self-pay

## 2020-11-12 ENCOUNTER — Ambulatory Visit: Payer: BLUE CROSS/BLUE SHIELD | Attending: Pain Medicine | Admitting: Pain Medicine

## 2020-11-12 DIAGNOSIS — G8929 Other chronic pain: Secondary | ICD-10-CM

## 2020-11-12 DIAGNOSIS — M76892 Other specified enthesopathies of left lower limb, excluding foot: Secondary | ICD-10-CM | POA: Diagnosis not present

## 2020-11-12 DIAGNOSIS — M25552 Pain in left hip: Secondary | ICD-10-CM

## 2020-11-12 DIAGNOSIS — M7072 Other bursitis of hip, left hip: Secondary | ICD-10-CM | POA: Diagnosis not present

## 2020-11-12 NOTE — Patient Instructions (Signed)
____________________________________________________________________________________________  Preparing for your procedure (without sedation)  Procedure appointments are limited to planned procedures: . No Prescription Refills. . No disability issues will be discussed. . No medication changes will be discussed.  Instructions: . Oral Intake: Do not eat or drink anything for at least 6 hours prior to your procedure. (Exception: Blood Pressure Medication. See below.) . Transportation: Unless otherwise stated by your physician, you may drive yourself after the procedure. . Blood Pressure Medicine: Do not forget to take your blood pressure medicine with a sip of water the morning of the procedure. If your Diastolic (lower reading)is above 100 mmHg, elective cases will be cancelled/rescheduled. . Blood thinners: These will need to be stopped for procedures. Notify our staff if you are taking any blood thinners. Depending on which one you take, there will be specific instructions on how and when to stop it. . Diabetics on insulin: Notify the staff so that you can be scheduled 1st case in the morning. If your diabetes requires high dose insulin, take only  of your normal insulin dose the morning of the procedure and notify the staff that you have done so. . Preventing infections: Shower with an antibacterial soap the morning of your procedure.  . Build-up your immune system: Take 1000 mg of Vitamin C with every meal (3 times a day) the day prior to your procedure. . Antibiotics: Inform the staff if you have a condition or reason that requires you to take antibiotics before dental procedures. . Pregnancy: If you are pregnant, call and cancel the procedure. . Sickness: If you have a cold, fever, or any active infections, call and cancel the procedure. . Arrival: You must be in the facility at least 30 minutes prior to your scheduled procedure. . Children: Do not bring any children with you. . Dress  appropriately: Bring dark clothing that you would not mind if they get stained. . Valuables: Do not bring any jewelry or valuables.  Reasons to call and reschedule or cancel your procedure: (Following these recommendations will minimize the risk of a serious complication.) . Surgeries: Avoid having procedures within 2 weeks of any surgery. (Avoid for 2 weeks before or after any surgery). . Flu Shots: Avoid having procedures within 2 weeks of a flu shots or . (Avoid for 2 weeks before or after immunizations). . Barium: Avoid having a procedure within 7-10 days after having had a radiological study involving the use of radiological contrast. (Myelograms, Barium swallow or enema study). . Heart attacks: Avoid any elective procedures or surgeries for the initial 6 months after a "Myocardial Infarction" (Heart Attack). . Blood thinners: It is imperative that you stop these medications before procedures. Let us know if you if you take any blood thinner.  . Infection: Avoid procedures during or within two weeks of an infection (including chest colds or gastrointestinal problems). Symptoms associated with infections include: Localized redness, fever, chills, night sweats or profuse sweating, burning sensation when voiding, cough, congestion, stuffiness, runny nose, sore throat, diarrhea, nausea, vomiting, cold or Flu symptoms, recent or current infections. It is specially important if the infection is over the area that we intend to treat. . Heart and lung problems: Symptoms that may suggest an active cardiopulmonary problem include: cough, chest pain, breathing difficulties or shortness of breath, dizziness, ankle swelling, uncontrolled high or unusually low blood pressure, and/or palpitations. If you are experiencing any of these symptoms, cancel your procedure and contact your primary care physician for an evaluation.  Remember:  Regular   Business hours are:  Monday to Thursday 8:00 AM to 4:00  PM  Provider's Schedule: Tyrek Lawhorn, MD:  Procedure days: Tuesday and Thursday 7:30 AM to 4:00 PM  Bilal Lateef, MD:  Procedure days: Monday and Wednesday 7:30 AM to 4:00 PM ____________________________________________________________________________________________   ____________________________________________________________________________________________  General Risks and Possible Complications  Patient Responsibilities: It is important that you read this as it is part of your informed consent. It is our duty to inform you of the risks and possible complications associated with treatments offered to you. It is your responsibility as a patient to read this and to ask questions about anything that is not clear or that you believe was not covered in this document.  Patient's Rights: You have the right to refuse treatment. You also have the right to change your mind, even after initially having agreed to have the treatment done. However, under this last option, if you wait until the last second to change your mind, you may be charged for the materials used up to that point.  Introduction: Medicine is not an exact science. Everything in Medicine, including the lack of treatment(s), carries the potential for danger, harm, or loss (which is by definition: Risk). In Medicine, a complication is a secondary problem, condition, or disease that can aggravate an already existing one. All treatments carry the risk of possible complications. The fact that a side effects or complications occurs, does not imply that the treatment was conducted incorrectly. It must be clearly understood that these can happen even when everything is done following the highest safety standards.  No treatment: You can choose not to proceed with the proposed treatment alternative. The "PRO(s)" would include: avoiding the risk of complications associated with the therapy. The "CON(s)" would include: not getting any of the  treatment benefits. These benefits fall under one of three categories: diagnostic; therapeutic; and/or palliative. Diagnostic benefits include: getting information which can ultimately lead to improvement of the disease or symptom(s). Therapeutic benefits are those associated with the successful treatment of the disease. Finally, palliative benefits are those related to the decrease of the primary symptoms, without necessarily curing the condition (example: decreasing the pain from a flare-up of a chronic condition, such as incurable terminal cancer).  General Risks and Complications: These are associated to most interventional treatments. They can occur alone, or in combination. They fall under one of the following six (6) categories: no benefit or worsening of symptoms; bleeding; infection; nerve damage; allergic reactions; and/or death. 1. No benefits or worsening of symptoms: In Medicine there are no guarantees, only probabilities. No healthcare provider can ever guarantee that a medical treatment will work, they can only state the probability that it may. Furthermore, there is always the possibility that the condition may worsen, either directly, or indirectly, as a consequence of the treatment. 2. Bleeding: This is more common if the patient is taking a blood thinner, either prescription or over the counter (example: Goody Powders, Fish oil, Aspirin, Garlic, etc.), or if suffering a condition associated with impaired coagulation (example: Hemophilia, cirrhosis of the liver, low platelet counts, etc.). However, even if you do not have one on these, it can still happen. If you have any of these conditions, or take one of these drugs, make sure to notify your treating physician. 3. Infection: This is more common in patients with a compromised immune system, either due to disease (example: diabetes, cancer, human immunodeficiency virus [HIV], etc.), or due to medications or treatments (example: therapies used    to treat cancer and rheumatological diseases). However, even if you do not have one on these, it can still happen. If you have any of these conditions, or take one of these drugs, make sure to notify your treating physician. 4. Nerve Damage: This is more common when the treatment is an invasive one, but it can also happen with the use of medications, such as those used in the treatment of cancer. The damage can occur to small secondary nerves, or to large primary ones, such as those in the spinal cord and brain. This damage may be temporary or permanent and it may lead to impairments that can range from temporary numbness to permanent paralysis and/or brain death. 5. Allergic Reactions: Any time a substance or material comes in contact with our body, there is the possibility of an allergic reaction. These can range from a mild skin rash (contact dermatitis) to a severe systemic reaction (anaphylactic reaction), which can result in death. 6. Death: In general, any medical intervention can result in death, most of the time due to an unforeseen complication. ____________________________________________________________________________________________   

## 2020-11-25 ENCOUNTER — Other Ambulatory Visit: Payer: Self-pay

## 2020-11-25 ENCOUNTER — Ambulatory Visit (HOSPITAL_BASED_OUTPATIENT_CLINIC_OR_DEPARTMENT_OTHER): Payer: BLUE CROSS/BLUE SHIELD | Admitting: Pain Medicine

## 2020-11-25 ENCOUNTER — Encounter: Payer: Self-pay | Admitting: Pain Medicine

## 2020-11-25 ENCOUNTER — Ambulatory Visit
Admission: RE | Admit: 2020-11-25 | Discharge: 2020-11-25 | Disposition: A | Discharge status: Home / Self Care | Payer: BLUE CROSS/BLUE SHIELD | Source: Ambulatory Visit | Attending: Pain Medicine | Admitting: Pain Medicine

## 2020-11-25 VITALS — BP 107/58 | HR 81 | Temp 97.2°F | Resp 21 | Ht 71.0 in | Wt 269.0 lb

## 2020-11-25 DIAGNOSIS — M8949 Other hypertrophic osteoarthropathy, multiple sites: Secondary | ICD-10-CM

## 2020-11-25 DIAGNOSIS — M25552 Pain in left hip: Secondary | ICD-10-CM

## 2020-11-25 DIAGNOSIS — M76892 Other specified enthesopathies of left lower limb, excluding foot: Secondary | ICD-10-CM | POA: Insufficient documentation

## 2020-11-25 DIAGNOSIS — G8929 Other chronic pain: Secondary | ICD-10-CM | POA: Insufficient documentation

## 2020-11-25 DIAGNOSIS — M7072 Other bursitis of hip, left hip: Secondary | ICD-10-CM | POA: Insufficient documentation

## 2020-11-25 DIAGNOSIS — M159 Polyosteoarthritis, unspecified: Secondary | ICD-10-CM

## 2020-11-25 MED ORDER — ROPIVACAINE HCL 2 MG/ML IJ SOLN
9.0000 mL | Freq: Once | INTRAMUSCULAR | Status: AC
  Administered 2020-11-25: 9 mL via INTRA_ARTICULAR
  Filled 2020-11-25: qty 10

## 2020-11-25 MED ORDER — METHYLPREDNISOLONE ACETATE 80 MG/ML IJ SUSP
80.0000 mg | Freq: Once | INTRAMUSCULAR | Status: AC
Start: 2020-11-25 — End: 2020-11-25
  Administered 2020-11-25: 80 mg via INTRA_ARTICULAR
  Filled 2020-11-25: qty 1

## 2020-11-25 MED ORDER — LIDOCAINE HCL 2 % IJ SOLN
20.0000 mL | Freq: Once | INTRAMUSCULAR | Status: AC
Start: 2020-11-25 — End: 2020-11-25
  Administered 2020-11-25: 400 mg
  Filled 2020-11-25: qty 40

## 2020-11-25 MED ORDER — IOHEXOL 180 MG/ML  SOLN
10.0000 mL | Freq: Once | INTRAMUSCULAR | Status: AC
Start: 2020-11-25 — End: 2020-11-25
  Administered 2020-11-25: 10 mL via INTRA_ARTICULAR
  Filled 2020-11-25: qty 20

## 2020-11-25 NOTE — Patient Instructions (Signed)

## 2020-11-25 NOTE — Progress Notes (Signed)
Safety precautions to be maintained throughout the outpatient stay will include: orient to surroundings, keep bed in low position, maintain call bell within reach at all times, provide assistance with transfer out of bed and ambulation.  

## 2020-11-25 NOTE — Progress Notes (Signed)
PROVIDER NOTE: Information contained herein reflects review and annotations entered in association with encounter. Interpretation of such information and data should be left to medically-trained personnel. Information provided to patient can be located elsewhere in the medical record under "Patient Instructions". Document created using STT-dictation technology, any transcriptional errors that may result from process are unintentional.    Patient: Nathan Lambert.  Service Category: Procedure  Provider: Oswaldo Done, MD  DOB: 1981-03-12  DOS: 11/25/2020  Location: [facility]  MRN: [id number]  Setting: Ambulatory - outpatient  Referring Provider: Delano Metz, MD  Type: Established Patient  Specialty: Interventional Pain Management  PCP: Smith Robert, MD   Primary Reason for Visit: Interventional Pain Management Treatment. CC: Hip Pain (left)  Procedure #1:  Anesthesia, Analgesia, Anxiolysis:  Type: Intra-Articular Hip Injection #1  Primary Purpose: Diagnostic Region: Posterolateral hip joint area. Level: Lower pelvic and hip joint level. Target Area: Superior aspect of the hip joint cavity, going thru the superior portion of the capsular ligament. Approach: Posterolateral approach. Laterality: Left  Type: Local Anesthesia Indication(s): Analgesia         Route: Infiltration ([place]/IM) IV Access: Declined Sedation: Declined  Local Anesthetic: Lidocaine 1-2%  Position: Lateral Decubitus with bad side up Area Prepped: Entire Posterolateral hip area. DuraPrep (Iodine Povacrylex [0.7% available iodine] and Isopropyl Alcohol, 74% w/w)   Procedure #2:    Type: Gluteofemoral Bursa Injection #1  Primary Purpose: Diagnostic Region: Upper (proximal) Femoral Region Level: Hip Joint Target Area: Superior aspect of the hip joint cavity, going thru the superior portion of the capsular ligament. Approach: Posterolateral approach Laterality: Left      Indications: 1. Chronic hip pain (Left)   2. Other bursitis of hip (Left)   3. Enthesopathy of hip region (Left)   4. Osteoarthritis involving multiple joints   5. Severe obesity (BMI 35.0-39.9) with comorbidity (HCC)    Pain Score: Pre-procedure: 3 /10 Post-procedure: 3 /10   Pre-op H&P Assessment:  [name] is a 40 y.o. (year old), male patient, seen today for interventional treatment. He  has a past surgical history that includes Fracture surgery (Left); Spine surgery (Right, 06/16/2015); and Back surgery. [name] has a current medication list which includes the following prescription(s): azelastine, doxycycline, econazole nitrate, fluticasone, gentamicin cream, loratadine, metformin, montelukast, naloxone, oxycodone hcl, [START ON 12/06/2020] oxycodone hcl, [START ON 01/05/2021] oxycodone hcl, terbinafine, calcium carbonate, gnp calcium 1200, vitamin d3, clobetasol ointment, and magnesium. His primarily concern today is the Hip Pain (left)  Initial Vital Signs:  Pulse/HCG Rate: 81ECG Heart Rate: 72 Temp: (!) 97.2 F (36.2 C) Resp: 14 BP: 125/73 SpO2: 100 %  BMI: Estimated body mass index is 37.52 kg/m as calculated from the following:   Height as of this encounter: 5\' 11"  (1.803 m).   Weight as of this encounter: 269 lb (122 kg).  Risk Assessment: Allergies: Reviewed. He is allergic to bactrim [sulfamethoxazole-trimethoprim], pollen extract, and sulfa antibiotics.  Allergy Precautions: None required Coagulopathies: Reviewed. None identified.  Blood-thinner therapy: None at this time Active Infection(s): Reviewed. None identified. [name] is afebrile  Site Confirmation: Mr. Bahl was asked to confirm the procedure and laterality before marking the site Procedure checklist: Completed Consent: Before the procedure and under the influence of no sedative(s), amnesic(s), or anxiolytics, the patient was informed of the treatment options, risks and  possible complications. To fulfill our ethical and legal obligations, as recommended by the American Medical Association's Code of Ethics, I have informed the patient  of my clinical impression; the nature and purpose of the treatment or procedure; the risks, benefits, and possible complications of the intervention; the alternatives, including doing nothing; the risk(s) and benefit(s) of the alternative treatment(s) or procedure(s); and the risk(s) and benefit(s) of doing nothing. The patient was provided information about the general risks and possible complications associated with the procedure. These may include, but are not limited to: failure to achieve desired goals, infection, bleeding, organ or nerve damage, allergic reactions, paralysis, and death. In addition, the patient was informed of those risks and complications associated to the procedure, such as failure to decrease pain; infection; bleeding; organ or nerve damage with subsequent damage to sensory, motor, and/or autonomic systems, resulting in permanent pain, numbness, and/or weakness of one or several areas of the body; allergic reactions; (i.e.: anaphylactic reaction); and/or death. Furthermore, the patient was informed of those risks and complications associated with the medications. These include, but are not limited to: allergic reactions (i.e.: anaphylactic or anaphylactoid reaction(s)); adrenal axis suppression; blood sugar elevation that in diabetics may result in ketoacidosis or comma; water retention that in patients with history of congestive heart failure may result in shortness of breath, pulmonary edema, and decompensation with resultant heart failure; weight gain; swelling or edema; medication-induced neural toxicity; particulate matter embolism and blood vessel occlusion with resultant organ, and/or nervous system infarction; and/or aseptic necrosis of one or more joints. Finally, the patient was informed that Medicine is not an  exact science; therefore, there is also the possibility of unforeseen or unpredictable risks and/or possible complications that may result in a catastrophic outcome. The patient indicated having understood very clearly. We have given the patient no guarantees and we have made no promises. Enough time was given to the patient to ask questions, all of which were answered to the patient's satisfaction. Mr. Siddoway has indicated that he wanted to continue with the procedure. Attestation: I, the ordering provider, attest that I have discussed with the patient the benefits, risks, side-effects, alternatives, likelihood of achieving goals, and potential problems during recovery for the procedure that I have provided informed consent. Date  Time: 11/25/2020  9:21 AM  Pre-Procedure Preparation:  Monitoring: As per clinic protocol. Respiration, ETCO2, SpO2, BP, heart rate and rhythm monitor placed and checked for adequate function Safety Precautions: Patient was assessed for positional comfort and pressure points before starting the procedure. Time-out: I initiated and conducted the "Time-out" before starting the procedure, as per protocol. The patient was asked to participate by confirming the accuracy of the "Time Out" information. Verification of the correct person, site, and procedure were performed and confirmed by me, the nursing staff, and the patient. "Time-out" conducted as per Joint Commission's Universal Protocol (UP.01.01.01). Time: 1010  Description of Procedure #1:  Safety Precautions: Aspiration looking for blood return was conducted prior to all injections. At no point did we inject any substances, as a needle was being advanced. No attempts were made at seeking any paresthesias. Safe injection practices and needle disposal techniques used. Medications properly checked for expiration dates. SDV (single dose vial) medications used. Description of the Procedure: Protocol guidelines were followed.  The patient was placed in position over the fluoroscopy table. The target area was identified and the area prepped in the usual manner. Skin & deeper tissues infiltrated with local anesthetic. Appropriate amount of time allowed to pass for local anesthetics to take effect. The procedure needles were then advanced to the target area. Proper needle placement secured. Negative aspiration confirmed. Solution  injected in intermittent fashion, asking for systemic symptoms every 0.5cc of injectate. The needles were then removed and the area cleansed, making sure to leave some of the prepping solution back to take advantage of its long term bactericidal properties.  Start Time: 1010 hrs. Materials:  Needle(s) Type: Spinal Needle Gauge: 22G Length: 5.0-in Medication(s): Please see orders for medications and dosing details.  Imaging Guidance (Non-Spinal):          Type of Imaging Technique: Fluoroscopy Guidance (Non-Spinal) Indication(s): Assistance in needle guidance and placement for procedures requiring needle placement in or near specific anatomical locations not easily accessible without such assistance. Exposure Time: Please see nurses notes. Contrast: Before injecting any contrast, we confirmed that the patient did not have an allergy to iodine, shellfish, or radiological contrast. Once satisfactory needle placement was completed at the desired level, radiological contrast was injected. Contrast injected under live fluoroscopy. No contrast complications. See chart for type and volume of contrast used. Fluoroscopic Guidance: I was personally present during the use of fluoroscopy. "Tunnel Vision Technique" used to obtain the best possible view of the target area. Parallax error corrected before commencing the procedure. "Direction-depth-direction" technique used to introduce the needle under continuous pulsed fluoroscopy. Once target was reached, antero-posterior, oblique, and lateral fluoroscopic  projection used confirm needle placement in all planes. Images permanently stored in EMR. Interpretation: I personally interpreted the imaging intraoperatively. Adequate needle placement confirmed in multiple planes. Appropriate spread of contrast into desired area was observed. No evidence of afferent or efferent intravascular uptake. Permanent images saved into the patient's record.   Description of Procedure #2:  Description of the Procedure: Skin & deeper tissues infiltrated with local anesthetic. Appropriate amount of time allowed to pass for local anesthetics to take effect. The procedure needles were then advanced to the target area. Proper needle placement secured. Negative aspiration confirmed. Solution injected in intermittent fashion, asking for systemic symptoms every 0.5cc of injectate. The needles were then removed and the area cleansed, making sure to leave some of the prepping solution back to take advantage of its long term bactericidal properties.  Vitals:   11/25/20 0920 11/25/20 1010 11/25/20 1015 11/25/20 1018  BP: 125/73 (!) 98/51 (!) 102/54 (!) 107/58  Pulse: 81     Resp: 14 20 19  (!) 21  Temp: (!) 97.2 F (36.2 C)     TempSrc: Temporal     SpO2: 100% 99% 98% 100%  Weight: 269 lb (122 kg)     Height: 5\' 11"  (1.803 m)        End Time: 1018 hrs.      Materials:  Needle(s) Type: Spinal Needle Gauge: 22G Length: 5.0-in Medication(s): Please see orders for medications and dosing details.  Imaging Guidance (Non-Spinal):          Type of Imaging Technique: Fluoroscopy Guidance (Non-Spinal) Indication(s): Assistance in needle guidance and placement for procedures requiring needle placement in or near specific anatomical locations not easily accessible without such assistance. Exposure Time: Please see nurses notes. Contrast: Before injecting any contrast, we confirmed that the patient did not have an allergy to iodine, shellfish, or radiological contrast. Once  satisfactory needle placement was completed at the desired level, radiological contrast was injected. Contrast injected under live fluoroscopy. No contrast complications. See chart for type and volume of contrast used. Fluoroscopic Guidance: I was personally present during the use of fluoroscopy. "Tunnel Vision Technique" used to obtain the best possible view of the target area. Parallax error corrected before commencing the  procedure. "Direction-depth-direction" technique used to introduce the needle under continuous pulsed fluoroscopy. Once target was reached, antero-posterior, oblique, and lateral fluoroscopic projection used confirm needle placement in all planes. Images permanently stored in EMR. Interpretation: I personally interpreted the imaging intraoperatively. Adequate needle placement confirmed in multiple planes. Appropriate spread of contrast into desired area was observed. No evidence of afferent or efferent intravascular uptake. Permanent images saved into the patient's record.  Antibiotic Prophylaxis:   Anti-infectives (From admission, onward)   None     Indication(s): None identified  Post-operative Assessment:  Post-procedure Vital Signs:  Pulse/HCG Rate: 8177 Temp: (!) 97.2 F (36.2 C) Resp: (!) 21 BP: (!) 107/58 SpO2: 100 %  EBL: None  Complications: No immediate post-treatment complications observed by team, or reported by patient.  Note: The patient tolerated the entire procedure well. A repeat set of vitals were taken after the procedure and the patient was kept under observation following institutional policy, for this type of procedure. Post-procedural neurological assessment was performed, showing return to baseline, prior to discharge. The patient was provided with post-procedure discharge instructions, including a section on how to identify potential problems. Should any problems arise concerning this procedure, the patient was given instructions to immediately  contact us, at any time, without hesitation. In any case, we plan to contact the patient by telephone for a follow-up status report regarding this interventional procedure.  Comments:  No additional relevant information.  Plan of Care  Orders:  Orders Placed This Encounter  Procedures  . HIP INJECTION    Scheduling Instructions:     Side: Left-sided     Sedation: Patient's choice.     Timeframe: Today  . DG PAIN CLIN[facility] C-ARM 1-60 MIN NO REPORT    Intraoperative interpretation by procedural physician at [facility].    Standing Status:   Standing    Number of Occurrences:   1    Order Specific Question:   Reason for exam:    Answer:   Assistance in needle guidance and placement for procedures requiring needle placement in or near specific anatomical locations not easily accessible without such assistance.  . Informed Consent Details: Physician/Practitioner Attestation; Transcribe to consent form and obtain patient signature    Nursing Order: Transcribe to consent form and obtain patient signature. Note: Always confirm laterality of pain with Mr. Machuca, before procedure.    Order Specific Question:   Physician/Practitioner attestation of informed consent for procedure/surgical case    Answer:   I, the physician/practitioner, attest that I have discussed with the patient the benefits, risks, side effects, alternatives, likelihood of achieving goals and potential problems during recovery for the procedure that I have provided informed consent.    Order Specific Question:   Procedure    Answer:   Hip injection    Order Specific Question:   Physician/Practitioner performing the procedure    Answer:   Raya Mckinstry A. Laban Emperor, MD    Order Specific Question:   Indication/Reason    Answer:   Hip Joint Pain (Arthralgia)  . Provide equipment / supplies at bedside    "Block Tray" (Disposable  single use) Needle type: SpinalSpinal Amount/quantity: 1 Size: Long (7-inch) Gauge: 22G     Standing Status:   Standing    Number of Occurrences:   1    Order Specific Question:   Specify    Answer:   Block Tray   Chronic Opioid Analgesic:  Oxycodone IR 10 mg 4 times daily MME/day: 60mg /day.   Medications ordered  for procedure: Meds ordered this encounter  Medications  . iohexol (OMNIPAQUE) 180 MG/ML injection 10 mL    Must be Myelogram-compatible. If not available, you may substitute with a water-soluble, non-ionic, hypoallergenic, myelogram-compatible radiological contrast medium.  Marland Kitchen lidocaine (XYLOCAINE) 2 % (with pres) injection 400 mg  . methylPREDNISolone acetate (DEPO-MEDROL) injection 80 mg  . ropivacaine (PF) 2 mg/mL (0.2%) (NAROPIN) injection 9 mL   Medications administered: We administered iohexol, lidocaine, methylPREDNISolone acetate, and ropivacaine (PF) 2 mg/mL (0.2%).  See the medical record for exact dosing, route, and time of administration.  Follow-up plan:   Return in about 2 weeks (around 12/09/2020) for (F2F), (PPE).       Interventional Therapies  Risk  Complexity Considerations:   Estimated body mass index is 37.66 kg/m as calculated from the following:   Height as of 10/29/20: 5\' 11"  (1.803 m).   Weight as of 10/29/20: 270 lb (122.5 kg). WNL   Planned  Pending:   Diagnostic left IA hip joint + bursa injection #1    Under consideration:   Diagnostic left SI joint block Possible left SI joint RFA Diagnostic caudal ESI + diagnostic epidurogram Possible Racz procedure Diagnostic left IA knee injection (w/ steroid) Possible left IA Hyalgan knee injections Diagnostic left Genicular NB Possible left Genicular nerve RFA   Completed:   Therapeutic right lumbar facet MBB x3 (05/15/2020)  Therapeutic left lumbar facet MBB x3 (05/15/2020)  Therapeutic right lumbar facet RFA x1 (10/11/2017)  Therapeutic left lumbar facet RFA x1 (11/24/2017)  Therapeutic right SI joint block x1 (05/19/2017)  Therapeutic left SI joint block x1 (05/19/2017)   Therapeutic right SI joint RFA x1 (10/11/2017)  Therapeutic right L4 TFES2 x1(05/30/2018)    Therapeutic  Palliative (PRN) options:   Palliative bilateral lumbar facet block #4 Palliative bilateral SI joint block #2  Diagnostic right L4 TFES2 #2     Recent Visits Date Type Provider Dept  11/12/20 Telemedicine 11/14/20, MD [facility]  10/29/20 Office Visit 10/31/20, MD [facility]  Showing recent visits within past 90 days and meeting all other requirements Today's Visits Date Type Provider Dept  11/25/20 Procedure visit 01/25/21, MD [facility]  Showing today's visits and meeting all other requirements Future Appointments Date Type Provider Dept  01/05/21 Appointment 01/07/21, MD [facility]  Showing future appointments within next 90 days and meeting all other requirements  Disposition: Discharge home  Discharge (Date  Time): 11/25/2020; 1024 hrs.   Primary Care Physician: 01/25/2021, MD Location: [facility] Note by: [facility], MD Date: 11/25/2020; Time: 10:47 AM  Disclaimer:  Medicine is not an exact science. The only guarantee in medicine is that nothing is guaranteed. It is important to note that the decision to proceed with this intervention was based on the information collected from the patient. The Data and conclusions were drawn from the patient's questionnaire, the interview, and the physical examination. Because the information was provided in large part by the patient, it cannot be guaranteed that it has not been purposely or unconsciously manipulated. Every effort has been made to obtain as much relevant data as possible for this evaluation. It is important to note that the conclusions that lead to this procedure are derived in large part from the available data. Always take into account that the treatment will also be dependent on availability of  resources and existing treatment guidelines, considered by other Pain Management Practitioners as being common knowledge  and practice, at the time of the intervention. For Medico-Legal purposes, it is also important to point out that variation in procedural techniques and pharmacological choices are the acceptable norm. The indications, contraindications, technique, and results of the above procedure should only be interpreted and judged by a Board-Certified Interventional Pain Specialist with extensive familiarity and expertise in the same exact procedure and technique.   

## 2020-11-26 ENCOUNTER — Telehealth: Payer: Self-pay

## 2020-11-26 NOTE — Telephone Encounter (Signed)
Post procedure phone call.  Patient states he is doing good.  

## 2021-01-04 NOTE — Progress Notes (Signed)
PROVIDER NOTE: Information contained herein reflects review and annotations entered in association with encounter. Interpretation of such information and data should be left to medically-trained personnel. Information provided to patient can be located elsewhere in the medical record under "Patient Instructions". Document created using STT-dictation technology, any transcriptional errors that may result from process are unintentional.    Patient: Nathan Lambert.  Service Category: E/M  Provider: Gaspar Cola, MD  DOB: June 04, 1981  DOS: 01/05/2021  Specialty: Interventional Pain Management  MRN: [id number]  Setting: Ambulatory outpatient  PCP: Vidal Schwalbe, MD  Type: Established Patient    Referring Provider: Vidal Schwalbe, MD  Location: Office  Delivery: Face-to-face     HPI  [name], a 40 y.o. year old male, is here today because of his Chronic pain syndrome [G89.4]. Nathan Lambert primary complain today is Back Pain and Hip Pain (left) Last encounter: My last encounter with him was on 11/25/2020. 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); Chronic lower extremity pain (3ry area of Pain) (Bilateral) (L>R); Lumbar facet hypertrophy (Bilateral); Lumbar facet syndrome (Bilateral) (R>L); Neurogenic pain; Spondylosis without myelopathy or radiculopathy, lumbar region; Other specified dorsopathies, sacral and sacrococcygeal region; Lumbar spondylosis; Chronic upper extremity pain (Left); Pain and numbness of left upper extremity; Cervical  radiculitis (C6/C7) (Left); DDD (degenerative disc disease), cervical; Cervicalgia; Osteoarthritis involving multiple joints; Chronic hip pain (4th area of Pain) (Bilateral) (L>R); Greater trochanteric bursitis (Left); Lumbar back pain with radiculopathy affecting left lower extremity (L5 dermatomal distribution); Subacute lumbar radiculopathy (L5) (Left); Chronic low back pain (1ry area of Pain) (Bilateral) w/o sciatica; Decreased range of motion of both hips; Chronic hip pain (Left); Enthesopathy of hip region (Left); Other bursitis of hip (Left); and Chronic groin pain (Left) on their pertinent problem list. Pain Assessment: Severity of Chronic pain is reported as a 3 /10. Location: Back Lower/denies. Onset: More than a month ago. Quality: Stabbing. Timing: Intermittent. Modifying factor(s): rest. Vitals:  height is _0  (1.803 m) and weight is 258 lb (117 kg). His temperature is 97.2 F (36.2 C) (abnormal). His blood pressure is 119/77 and his pulse is 69. His respiration is 18 and oxygen saturation is 100%.   Reason for encounter: both, medication management and post-procedure assessment.   The patient indicates doing well with the current medication regimen. No adverse reactions or side effects reported to the medications.  According to the patient he attained 100% relief of the pain for the duration of the local anesthetic which then completely went away once the local anesthetic wore off.  He still having pain in the left lower back left groin area and over the left hip area.  On 10/29/2020 I provided this patient with 3 prescriptions for oxycodone.  According to the PMP he has had 2 of 3 filled.  The next prescription is to be filled on 01/05/2021 and it should last until 02/04/2021.  Physical exam today shows the patient to be having weakness trying to perform a straight leg raise on the left side.  Straight leg raise on the right was negative.  Patrick maneuver was positive on the left side for  left hip and SI joint  arthralgia with referral to the lateral aspect of his pelvis, low back, and groin area.  He also indicated having pain that seems to follow a left L1 dermatome.  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  Provocative physical exam today with hyperextension on rotation did seem to trigger pain in the lower lumbar region from facet arthropathy/arthralgia. (Bilateral) (L>R).  Although there is the possibility that this pain could be coming from the lumbar area, because he did get 100% relief of the pain for the duration of the local anesthetic after the hip injection, it is more likely that this is the source of the pain.  Because of this, today I will be ordering an MRI of the left hip joint since the x-rays were noncontributory towards telling us what may be happening at that level.  Should the MRI not provide [place] with any answers, then the neck step may be to repeat the lumbar spine MRI.  His last lumbar MRI was done around 2018.  Today the patient asked me if he would be losing the function on his left hip, but I told him that at this point I do not know since I do not have enough information as to what may be causing this pain.  He understood and accepted.  RTCB: 05/05/2021 Nonopioids transferred 05/07/2020: Magnesium, vitamin D3, and calcium  Post-Procedure Evaluation  Procedure (11/25/2020): Diagnostic left IA hip joint injection #1 + diagnostic left gluteofemoral bursa injection #1 under fluoroscopic guidance, no sedation Pre-procedure pain level: 3/10 Post-procedure: 3/10 No initial benefit, possibly due to rapid discharge after no sedation procedure, without enough time to allow full onset of block.  Sedation: None.  Effectiveness during initial hour after procedure(Ultra-Short Term Relief): 100 %.  Local anesthetic used: Long-acting (4-6 hours) Effectiveness:  Defined as any analgesic benefit obtained secondary to the administration of local anesthetics. This carries significant diagnostic value as to the etiological location, or anatomical origin, of the pain. Duration of benefit is expected to coincide with the duration of the local anesthetic used.  Effectiveness during initial 4-6 hours after procedure(Short-Term Relief): 100 %.  Long-term benefit: Defined as any relief past the pharmacologic duration of the local anesthetics.  Effectiveness past the initial 6 hours after procedure(Long-Term Relief): 0 %.  Current benefits: Defined as benefit that persist at this time.   Analgesia:  Back to baseline Function: Back to baseline ROM: Back to baseline  Pharmacotherapy Assessment  Analgesic: Oxycodone IR 10 mg 4 times daily MME/day: 60 mg/day.   Monitoring: [place] PMP: PDMP reviewed during this encounter.       Pharmacotherapy: No side-effects or adverse reactions reported. Compliance: No problems identified. Effectiveness: Clinically acceptable.  Dewayne Shorter, RN  01/05/2021 12:15 PM  Sign when Signing Visit Nursing Pain Medication Assessment:  Safety precautions to be maintained throughout the outpatient stay will include: orient to surroundings, keep bed in low position, maintain call bell within reach at all times, provide assistance with transfer out of bed and ambulation.  Medication Inspection Compliance: Pill count conducted under aseptic conditions, in front of the patient. Neither the pills nor the bottle was removed from the patient's sight at any time. Once count was completed pills were immediately returned to the patient in their original bottle.  Medication: Oxycodone IR Pill/Patch Count:  1 of 120 pills remain Pill/Patch Appearance: Markings consistent with prescribed medication Bottle Appearance: Standard pharmacy container.  Clearly labeled. Filled Date: 05 / 21 / 2022 Last Medication intake:  Today    UDS:  Summary  Date Value  Ref Range Status  11/15/2019 Note  Final    Comment:    ==================================================================== [employer] Select 13 (MW) ==================================================================== Test                             Result       Flag       Units Drug Present and Declared for Prescription Verification   Oxycodone                      1444         EXPECTED   ng/mg creat   Oxymorphone                    2592         EXPECTED   ng/mg creat   Noroxycodone                   1773         EXPECTED   ng/mg creat   Noroxymorphone                 717          EXPECTED   ng/mg creat    Sources of oxycodone are scheduled prescription medications.    Oxymorphone, noroxycodone, and noroxymorphone are expected    metabolites of oxycodone. Oxymorphone is also available as a    scheduled prescription medication. ==================================================================== Test                      Result    Flag   Units      Ref Range   Creatinine              196              mg/dL      >=20 ==================================================================== Declared Medications:  The flagging and interpretation on this report are based on the  following declared medications.  Unexpected results may arise from  inaccuracies in the declared medications.  **Note: The testing scope of this panel includes these medications:  Oxycodone  **Note: The testing scope of this panel does not include the  following reported medications:  Calcium  Cholecalciferol  Loratadine  Magnesium (Mag-Ox)  Meloxicam  Metformin  Montelukast  Naloxone  Vitamin D ==================================================================== For clinical consultation, please call [phone]. ====================================================================      ROS  Constitutional: Denies any fever or chills Gastrointestinal: No reported hemesis, hematochezia, vomiting, or  acute GI distress Musculoskeletal: Denies any acute onset joint swelling, redness, loss of ROM, or weakness Neurological: No reported episodes of acute onset apraxia, aphasia, dysarthria, agnosia, amnesia, paralysis, loss of coordination, or loss of consciousness  Medication Review  GNP Calcium 1200, Magnesium, Magnesium Oxide, Oxycodone HCl, Vitamin D3, azelastine, calcium carbonate, clobetasol ointment, doxycycline, econazole nitrate, famotidine, fluticasone, gentamicin cream, loratadine, metFORMIN, montelukast, naloxone, terbinafine, and testosterone cypionate  History Review  Allergy: Nathan Lambert is allergic to bactrim [sulfamethoxazole-trimethoprim], pollen extract, and sulfa antibiotics. Drug: Nathan Lambert  reports no history of drug use. Alcohol:  reports no history of alcohol use. Tobacco:  reports that he has been smoking cigarettes. He has a 10.00 pack-year smoking history. He has never used smokeless tobacco. Social: Nathan Lambert  reports that he has been smoking cigarettes. He  has a 10.00 pack-year smoking history. He has never used smokeless tobacco. He reports that he does not drink alcohol and does not use drugs. Medical:  has a past medical history of Allergy (2012), Chronic low back pain (Primary Area of Pain) (Bilateral) (R>L), Degeneration of lumbar or lumbosacral intervertebral disc (L4-L5) (12/27/2016), Headache, and Opiate use (02/15/2017). Surgical: Nathan Lambert  has a past surgical history that includes Fracture surgery (Left); Spine surgery (Right, 06/16/2015); and Back surgery. Family: family history includes AAA (abdominal aortic aneurysm) in his mother; Diabetes in his father; Stroke in his mother.  Laboratory Chemistry Profile   Renal Lab Results  Component Value Date   BUN 10 06/07/2019   CREATININE 0.76 06/07/2019   BCR 7 (L) 12/27/2016   GFRAA >60 06/07/2019   GFRNONAA >60 06/07/2019     Hepatic Lab Results  Component Value Date   AST 14 (L)  06/07/2019   ALT 17 06/07/2019   ALBUMIN 4.0 06/07/2019   ALKPHOS 50 06/07/2019     Electrolytes Lab Results  Component Value Date   NA 138 06/07/2019   K 4.3 06/07/2019   CL 104 06/07/2019   CALCIUM 9.0 06/07/2019   MG 2.0 06/07/2019     Bone Lab Results  Component Value Date   VD25OH 15.51 (L) 06/07/2019   25OHVITD1 17 (L) 12/27/2016   25OHVITD2 <1.0 12/27/2016   25OHVITD3 17 12/27/2016   TESTOFREE 2.6 (L) 06/08/2017   TESTOSTERONE 116 (L) 06/08/2017     Inflammation (CRP: Acute Phase) (ESR: Chronic Phase) Lab Results  Component Value Date   CRP 2.7 (H) 06/07/2019   ESRSEDRATE 17 (H) 06/07/2019       Note: Above Lab results reviewed.  Recent Imaging Review  DG PAIN CLINIC C-ARM 1-60 MIN NO REPORT Fluoro was used, but no Radiologist interpretation will be provided.  Please refer to "NOTES" tab for provider progress note. Note: Reviewed        Physical Exam  General appearance: Well nourished, well developed, and well hydrated. In no apparent acute distress Mental status: Alert, oriented x 3 (person, place, & time)       Respiratory: No evidence of acute respiratory distress Eyes: PERLA Vitals: BP 119/77   Pulse 69   Temp (!) 97.2 F (36.2 C)   Resp 18   Ht _0  (1.803 m)   Wt 258 lb (117 kg)   SpO2 100%   BMI 35.98 kg/m  BMI: Estimated body mass index is 35.98 kg/m as calculated from the following:   Height as of this encounter: _1  (1.803 m).   Weight as of this encounter: 258 lb (117 kg). Ideal: Ideal body weight: 75.3 kg (166 lb 0.1 oz) Adjusted ideal body weight: 92 kg (202 lb 12.9 oz)  Assessment   Status Diagnosis  Controlled Controlled Controlled 1. Chronic pain syndrome   2. Chronic hip pain (Left)   3. Other bursitis of hip (Left)   4. Enthesopathy of hip region (Left)   5. Chronic low back pain (1ry area of Pain) (Bilateral) w/o sciatica   6. Chronic knee pain (2ry area of Pain) (Left)   7. Chronic lower extremity pain (3ry  area of Pain) (Bilateral) (L>R)   8. Chronic hip pain (4th area of Pain) (Bilateral) (L>R)   9. Pharmacologic therapy   10. Chronic use of opiate for therapeutic purpose   11. Chronic groin pain (Left)      Updated Problems: Problem  Chronic groin pain (Left)  Chronic low  back pain (1ry area of Pain) (Bilateral) w/o sciatica  Chronic hip pain (4th area of Pain) (Bilateral) (L>R)  Chronic knee pain (2ry area of Pain) (Left)  Chronic lower extremity pain (3ry area of Pain) (Bilateral) (L>R)    Plan of Care  Problem-specific:  No problem-specific Assessment & Plan notes found for this encounter.  Mr. Mitsuo Budnick. has a current medication list which includes the following long-term medication(s): azelastine, calcium carbonate, gnp calcium 1200, vitamin d3, famotidine, fluticasone, loratadine, magnesium oxide, metformin, montelukast, naloxone, oxycodone hcl, [START ON 02/04/2021] oxycodone hcl, [START ON 03/06/2021] oxycodone hcl, [START ON 04/05/2021] oxycodone hcl, and magnesium.  Pharmacotherapy (Medications Ordered): Meds ordered this encounter  Medications   Oxycodone HCl 10 MG TABS    Sig: Take 1 tablet (10 mg total) by mouth every 6 (six) hours as needed. Must last 30 days    Dispense:  120 tablet    Refill:  0    Not a duplicate. Do NOT delete! Dispense 1 day early if closed on refill date. Avoid benzodiazepines within 8 hours of opioids. Do not send refill requests.   Oxycodone HCl 10 MG TABS    Sig: Take 1 tablet (10 mg total) by mouth every 6 (six) hours as needed. Must last 30 days    Dispense:  120 tablet    Refill:  0    Not a duplicate. Do NOT delete! Dispense 1 day early if closed on refill date. Avoid benzodiazepines within 8 hours of opioids. Do not send refill requests.   Oxycodone HCl 10 MG TABS    Sig: Take 1 tablet (10 mg total) by mouth every 6 (six) hours as needed. Must last 30 days    Dispense:  120 tablet    Refill:  0    Not a duplicate. Do NOT  delete! Dispense 1 day early if closed on refill date. Avoid benzodiazepines within 8 hours of opioids. Do not send refill requests.    Orders:  Orders Placed This Encounter  Procedures   MR HIP LEFT WO CONTRAST    Standing Status:   Future    Standing Expiration Date:   02/04/2021    Scheduling Instructions:     Imaging must be done as soon as possible. Inform patient that order will expire within 30 days and I will not renew it.    Order Specific Question:   What is the patient's sedation requirement?    Answer:   No Sedation    Order Specific Question:   Does the patient have a pacemaker or implanted devices?    Answer:   No    Order Specific Question:   Preferred imaging location?    Answer:   [facility] (table limit-350lbs)    Order Specific Question:   Call Results- Best Contact Number?    Answer:   [phone] ([facility])    Order Specific Question:   Radiology Contrast Protocol - do NOT remove file path    Answer:   [email or web address]   [employer] Select 13 (MW), Urine    Volume: 30 ml(s). Minimum 3 ml of urine is needed. Document temperature of fresh sample. Indications: Long term (current) use of opiate analgesic (B15.176)    Order Specific Question:   Release to patient    Answer:   Immediate    Follow-up plan:   Return in about 4 months (around 05/05/2021) for evaluation day (F2F) (MM).     Interventional Therapies  Risk  Complexity Considerations:  Estimated body mass index is 37.66 kg/m as calculated from the following:   Height as of 10/29/20: _0  (1.803 m).   Weight as of 10/29/20: 270 lb (122.5 kg). WNL   Planned  Pending:   Diagnostic left IA hip joint + bursa injection #1    Under consideration:   Diagnostic left SI joint block Possible left SI joint RFA Diagnostic caudal ESI + diagnostic epidurogram  Possible Racz procedure  Diagnostic left IA knee injection (w/ steroid)  Possible left IA Hyalgan knee  injections  Diagnostic left Genicular NB  Possible left Genicular nerve RFA    Completed:   Therapeutic right lumbar facet MBB x3 (05/15/2020)  Therapeutic left lumbar facet MBB x3 (05/15/2020)  Therapeutic right lumbar facet RFA x1 (10/11/2017)  Therapeutic left lumbar facet RFA x1 (11/24/2017)  Therapeutic right SI joint block x1 (05/19/2017)  Therapeutic left SI joint block x1 (05/19/2017)  Therapeutic right SI joint RFA x1 (10/11/2017)  Therapeutic right L4 TFES2 x1 (05/30/2018)    Therapeutic  Palliative (PRN) options:   Palliative bilateral lumbar facet block #4 Palliative bilateral SI joint block #2  Diagnostic right L4 TFES2 #2       Recent Visits Date Type Provider Dept  11/25/20 Procedure visit Milinda Pointer, MD [facility]  11/12/20 Telemedicine Milinda Pointer, MD [facility]  10/29/20 Office Visit Milinda Pointer, MD [facility]  Showing recent visits within past 90 days and meeting all other requirements Today's Visits Date Type Provider Dept  01/05/21 Office Visit Milinda Pointer, MD [facility]  Showing today's visits and meeting all other requirements Future Appointments No visits were found meeting these conditions. Showing future appointments within next 90 days and meeting all other requirements I discussed the assessment and treatment plan with the patient. The patient was provided an opportunity to ask questions and all were answered. The patient agreed with the plan and demonstrated an understanding of the instructions.  Patient advised to call back or seek an in-person evaluation if the symptoms or condition worsens.  Duration of encounter: 30 minutes.  Note by: Gaspar Cola, MD Date: 01/05/2021; Time: 12:43 PM

## 2021-01-05 ENCOUNTER — Encounter: Payer: Self-pay | Admitting: Pain Medicine

## 2021-01-05 ENCOUNTER — Other Ambulatory Visit: Payer: Self-pay

## 2021-01-05 ENCOUNTER — Ambulatory Visit: Payer: BLUE CROSS/BLUE SHIELD | Attending: Pain Medicine | Admitting: Pain Medicine

## 2021-01-05 VITALS — BP 119/77 | HR 69 | Temp 97.2°F | Resp 18 | Ht 71.0 in | Wt 258.0 lb

## 2021-01-05 DIAGNOSIS — M25551 Pain in right hip: Secondary | ICD-10-CM | POA: Diagnosis present

## 2021-01-05 DIAGNOSIS — M25552 Pain in left hip: Secondary | ICD-10-CM | POA: Diagnosis present

## 2021-01-05 DIAGNOSIS — G8929 Other chronic pain: Secondary | ICD-10-CM | POA: Insufficient documentation

## 2021-01-05 DIAGNOSIS — M76892 Other specified enthesopathies of left lower limb, excluding foot: Secondary | ICD-10-CM | POA: Insufficient documentation

## 2021-01-05 DIAGNOSIS — M545 Low back pain, unspecified: Secondary | ICD-10-CM | POA: Diagnosis present

## 2021-01-05 DIAGNOSIS — M79605 Pain in left leg: Secondary | ICD-10-CM | POA: Diagnosis present

## 2021-01-05 DIAGNOSIS — M25562 Pain in left knee: Secondary | ICD-10-CM | POA: Diagnosis present

## 2021-01-05 DIAGNOSIS — M7072 Other bursitis of hip, left hip: Secondary | ICD-10-CM | POA: Insufficient documentation

## 2021-01-05 DIAGNOSIS — Z79899 Other long term (current) drug therapy: Secondary | ICD-10-CM | POA: Diagnosis present

## 2021-01-05 DIAGNOSIS — R1032 Left lower quadrant pain: Secondary | ICD-10-CM | POA: Diagnosis present

## 2021-01-05 DIAGNOSIS — G894 Chronic pain syndrome: Secondary | ICD-10-CM | POA: Insufficient documentation

## 2021-01-05 DIAGNOSIS — M79604 Pain in right leg: Secondary | ICD-10-CM

## 2021-01-05 DIAGNOSIS — Z79891 Long term (current) use of opiate analgesic: Secondary | ICD-10-CM | POA: Diagnosis present

## 2021-01-05 MED ORDER — OXYCODONE HCL 10 MG PO TABS: 10.0000 mg | ORAL_TABLET | Freq: Four times a day (QID) | ORAL | 0 refills | Status: DC | PRN

## 2021-01-05 NOTE — Patient Instructions (Signed)
____________________________________________________________________________________________  Medication Rules  Purpose: To inform patients, and their family members, of our rules and regulations.  Applies to: All patients receiving prescriptions (written or electronic).  Pharmacy of record: Pharmacy where electronic prescriptions will be sent. If written prescriptions are taken to a different pharmacy, please inform the nursing staff. The pharmacy listed in the electronic medical record should be the one where you would like electronic prescriptions to be sent.  Electronic prescriptions: In compliance with the [place] Strengthen Opioid Misuse Prevention (STOP) Act of 2017 (Session Law 2017-74/H243), effective July 19, 2018, all controlled substances must be electronically prescribed. Calling prescriptions to the pharmacy will cease to exist.  Prescription refills: Only during scheduled appointments. Applies to all prescriptions.  NOTE: The following applies primarily to controlled substances (Opioid* Pain Medications).   Type of encounter (visit): For patients receiving controlled substances, face-to-face visits are required. (Not an option or up to the patient.)  Patient's responsibilities: Pain Pills: Bring all pain pills to every appointment (except for procedure appointments). Pill Bottles: Bring pills in original pharmacy bottle. Always bring the newest bottle. Bring bottle, even if empty. Medication refills: You are responsible for knowing and keeping track of what medications you take and those you need refilled. The day before your appointment: write a list of all prescriptions that need to be refilled. The day of the appointment: give the list to the admitting nurse. Prescriptions will be written only during appointments. No prescriptions will be written on procedure days. If you forget a medication: it will not be "Called in", "Faxed", or "electronically sent". You will  need to get another appointment to get these prescribed. No early refills. Do not call asking to have your prescription filled early. Prescription Accuracy: You are responsible for carefully inspecting your prescriptions before leaving our office. Have the discharge nurse carefully go over each prescription with you, before taking them home. Make sure that your name is accurately spelled, that your address is correct. Check the name and dose of your medication to make sure it is accurate. Check the number of pills, and the written instructions to make sure they are clear and accurate. Make sure that you are given enough medication to last until your next medication refill appointment. Taking Medication: Take medication as prescribed. When it comes to controlled substances, taking less pills or less frequently than prescribed is permitted and encouraged. Never take more pills than instructed. Never take medication more frequently than prescribed.  Inform other Doctors: Always inform, all of your healthcare providers, of all the medications you take. Pain Medication from other Providers: You are not allowed to accept any additional pain medication from any other Doctor or Healthcare provider. There are two exceptions to this rule. (see below) In the event that you require additional pain medication, you are responsible for notifying us, as stated below. Cough Medicine: Often these contain an opioid, such as codeine or hydrocodone. Never accept or take cough medicine containing these opioids if you are already taking an opioid* medication. The combination may cause respiratory failure and death. Medication Agreement: You are responsible for carefully reading and following our Medication Agreement. This must be signed before receiving any prescriptions from our practice. Safely store a copy of your signed Agreement. Violations to the Agreement will result in no further prescriptions. (Additional copies of our  Medication Agreement are available upon request.) Laws, Rules, & Regulations: All patients are expected to follow all Federal and State Laws, Statutes, Rules, & Regulations. Ignorance of   the Laws does not constitute a valid excuse.  Illegal drugs and Controlled Substances: The use of illegal substances (including, but not limited to marijuana and its derivatives) and/or the illegal use of any controlled substances is strictly prohibited. Violation of this rule may result in the immediate and permanent discontinuation of any and all prescriptions being written by our practice. The use of any illegal substances is prohibited. Adopted CDC guidelines & recommendations: Target dosing levels will be at or below 60 MME/day. Use of benzodiazepines** is not recommended.  Exceptions: There are only two exceptions to the rule of not receiving pain medications from other Healthcare Providers. Exception #1 (Emergencies): In the event of an emergency (i.e.: accident requiring emergency care), you are allowed to receive additional pain medication. However, you are responsible for: As soon as you are able, call our office [phone], at any time of the day or night, and leave a message stating your name, the date and nature of the emergency, and the name and dose of the medication prescribed. In the event that your call is answered by a member of our staff, make sure to document and save the date, time, and the name of the person that took your information.  Exception #2 (Planned Surgery): In the event that you are scheduled by another doctor or dentist to have any type of surgery or procedure, you are allowed (for a period no longer than 30 days), to receive additional pain medication, for the acute post-op pain. However, in this case, you are responsible for picking up a copy of our "Post-op Pain Management for Surgeons" handout, and giving it to your surgeon or dentist. This document is available at our office, and  does not require an appointment to obtain it. Simply go to our office during business hours (Monday-Thursday from 8:00 AM to 4:00 PM) (Friday 8:00 AM to 12:00 Noon) or if you have a scheduled appointment with us, prior to your surgery, and ask for it by name. In addition, you are responsible for: calling our office [phone], at any time of the day or night, and leaving a message stating your name, name of your surgeon, type of surgery, and date of procedure or surgery. Failure to comply with your responsibilities may result in termination of therapy involving the controlled substances.  *Opioid medications include: morphine, codeine, oxycodone, oxymorphone, hydrocodone, hydromorphone, meperidine, tramadol, tapentadol, buprenorphine, fentanyl, methadone. **Benzodiazepine medications include: diazepam (Valium), alprazolam (Xanax), clonazepam (Klonopine), lorazepam (Ativan), clorazepate (Tranxene), chlordiazepoxide (Librium), estazolam (Prosom), oxazepam (Serax), temazepam (Restoril), triazolam (Halcion) (Last updated: 06/16/2020) ____________________________________________________________________________________________  ____________________________________________________________________________________________  Medication Recommendations and Reminders  Applies to: All patients receiving prescriptions (written and/or electronic).  Medication Rules & Regulations: These rules and regulations exist for your safety and that of others. They are not flexible and neither are we. Dismissing or ignoring them will be considered "non-compliance" with medication therapy, resulting in complete and irreversible termination of such therapy. (See document titled "Medication Rules" for more details.) In all conscience, because of safety reasons, we cannot continue providing a therapy where the patient does not follow instructions.  Pharmacy of record:  Definition: This is the pharmacy where your electronic  prescriptions will be sent.  We do not endorse any particular pharmacy, however, we have experienced problems with [facility] not securing enough medication supply for the community. We do not restrict you in your choice of pharmacy. However, once we write for your prescriptions, we will NOT be re-sending more prescriptions to fix restricted supply problems   created by your pharmacy, or your insurance.  The pharmacy listed in the electronic medical record should be the one where you want electronic prescriptions to be sent. If you choose to change pharmacy, simply notify our nursing staff.  Recommendations: Keep all of your pain medications in a safe place, under lock and key, even if you live alone. We will NOT replace lost, stolen, or damaged medication. After you fill your prescription, take 1 week's worth of pills and put them away in a safe place. You should keep a separate, properly labeled bottle for this purpose. The remainder should be kept in the original bottle. Use this as your primary supply, until it runs out. Once it's gone, then you know that you have 1 week's worth of medicine, and it is time to come in for a prescription refill. If you do this correctly, it is unlikely that you will ever run out of medicine. To make sure that the above recommendation works, it is very important that you make sure your medication refill appointments are scheduled at least 1 week before you run out of medicine. To do this in an effective manner, make sure that you do not leave the office without scheduling your next medication management appointment. Always ask the nursing staff to show you in your prescription , when your medication will be running out. Then arrange for the receptionist to get you a return appointment, at least 7 days before you run out of medicine. Do not wait until you have 1 or 2 pills left, to come in. This is very poor planning and does not take into consideration that we may need to  cancel appointments due to bad weather, sickness, or emergencies affecting our staff. DO NOT ACCEPT A "Partial Fill": If for any reason your pharmacy does not have enough pills/tablets to completely fill or refill your prescription, do not allow for a "partial fill". The law allows the pharmacy to complete that prescription within 72 hours, without requiring a new prescription. If they do not fill the rest of your prescription within those 72 hours, you will need a separate prescription to fill the remaining amount, which we will NOT provide. If the reason for the partial fill is your insurance, you will need to talk to the pharmacist about payment alternatives for the remaining tablets, but again, DO NOT ACCEPT A PARTIAL FILL, unless you can trust your pharmacist to obtain the remainder of the pills within 72 hours.  Prescription refills and/or changes in medication(s):  Prescription refills, and/or changes in dose or medication, will be conducted only during scheduled medication management appointments. (Applies to both, written and electronic prescriptions.) No refills on procedure days. No medication will be changed or started on procedure days. No changes, adjustments, and/or refills will be conducted on a procedure day. Doing so will interfere with the diagnostic portion of the procedure. No phone refills. No medications will be "called into the pharmacy". No Fax refills. No weekend refills. No Holliday refills. No after hours refills.  Remember:  Business hours are:  Monday to Thursday 8:00 AM to 4:00 PM Provider's Schedule: Nandi Tonnesen, MD - Appointments are:  Medication management: Monday and Wednesday 8:00 AM to 4:00 PM Procedure day: Tuesday and Thursday 7:30 AM to 4:00 PM Bilal Lateef, MD - Appointments are:  Medication management: Tuesday and Thursday 8:00 AM to 4:00 PM Procedure day: Monday and Wednesday 7:30 AM to 4:00 PM (Last update:  02/06/2020) ____________________________________________________________________________________________  ____________________________________________________________________________________________  CBD (cannabidiol) WARNING    Applicable to: All individuals currently taking or considering taking CBD (cannabidiol) and, more important, all patients taking opioid analgesic controlled substances (pain medication). (Example: oxycodone; oxymorphone; hydrocodone; hydromorphone; morphine; methadone; tramadol; tapentadol; fentanyl; buprenorphine; butorphanol; dextromethorphan; meperidine; codeine; etc.)  Legal status: CBD remains a Schedule I drug prohibited for any use. CBD is illegal with one exception. In the [place], CBD has a limited Food and Drug Administration (FDA) approval for the treatment of two specific types of epilepsy disorders. Only one CBD product has been approved by the FDA for this purpose: "Epidiolex". FDA is aware that some companies are marketing products containing cannabis and cannabis-derived compounds in ways that violate the Federal Food, Drug and Cosmetic Act (FD&C Act) and that may put the health and safety of consumers at risk. The FDA, a Federal agency, has not enforced the CBD status since 2018.   Legality: Some manufacturers ship CBD products nationally, which is illegal. Often such products are sold online and are therefore available throughout the country. CBD is openly sold in head shops and health food stores in some states where such sales have not been explicitly legalized. Selling unapproved products with unsubstantiated therapeutic claims is not only a violation of the law, but also can put patients at risk, as these products have not been proven to be safe or effective. Federal illegality makes it difficult to conduct research on CBD.  Reference: "FDA Regulation of Cannabis and Cannabis-Derived Products, Including Cannabidiol (CBD)" -  https://www.fda.gov/news-events/public-health-focus/fda-regulation-cannabis-and-cannabis-derived-products-including-cannabidiol-cbd  Warning: CBD is not FDA approved and has not undergo the same manufacturing controls as prescription drugs.  This means that the purity and safety of available CBD may be questionable. Most of the time, despite manufacturer's claims, it is contaminated with THC (delta-9-tetrahydrocannabinol - the chemical in marijuana responsible for the "HIGH").  When this is the case, the THC contaminant will trigger a positive urine drug screen (UDS) test for Marijuana (carboxy-THC). Because a positive UDS for any illicit substance is a violation of our medication agreement, your opioid analgesics (pain medicine) may be permanently discontinued.  MORE ABOUT CBD  General Information: CBD  is a derivative of the Marijuana (cannabis sativa) plant discovered in 1940. It is one of the 113 identified substances found in Marijuana. It accounts for up to 40% of the plant's extract. As of 2018, preliminary clinical studies on CBD included research for the treatment of anxiety, movement disorders, and pain. CBD is available and consumed in multiple forms, including inhalation of smoke or vapor, as an aerosol spray, and by mouth. It may be supplied as an oil containing CBD, capsules, dried cannabis, or as a liquid solution. CBD is thought not to be as psychoactive as THC (delta-9-tetrahydrocannabinol - the chemical in marijuana responsible for the "HIGH"). Studies suggest that CBD may interact with different biological target receptors in the body, including cannabinoid and other neurotransmitter receptors. As of 2018 the mechanism of action for its biological effects has not been determined.  Side-effects  Adverse reactions: Dry mouth, diarrhea, decreased appetite, fatigue, drowsiness, malaise, weakness, sleep disturbances, and others.  Drug interactions: CBC may interact with other medications  such as blood-thinners. (Last update: 02/23/2020) ____________________________________________________________________________________________  ____________________________________________________________________________________________  Drug Holidays (Slow)  What is a "Drug Holiday"? Drug Holiday: is the name given to the period of time during which a patient stops taking a medication(s) for the purpose of eliminating tolerance to the drug.  Benefits Improved effectiveness of opioids. Decreased opioid dose needed to achieve benefits. Improved pain with lesser dose.    What is tolerance? Tolerance: is the progressive decreased in effectiveness of a drug due to its repetitive use. With repetitive use, the body gets use to the medication and as a consequence, it loses its effectiveness. This is a common problem seen with opioid pain medications. As a result, a larger dose of the drug is needed to achieve the same effect that used to be obtained with a smaller dose.  How long should a "Drug Holiday" last? You should stay off of the pain medicine for at least 14 consecutive days. (2 weeks)  Should I stop the medicine "cold turkey"? No. You should always coordinate with your Pain Specialist so that he/she can provide you with the correct medication dose to make the transition as smoothly as possible.  How do I stop the medicine? Slowly. You will be instructed to decrease the daily amount of pills that you take by one (1) pill every seven (7) days. This is called a "slow downward taper" of your dose. For example: if you normally take four (4) pills per day, you will be asked to drop this dose to three (3) pills per day for seven (7) days, then to two (2) pills per day for seven (7) days, then to one (1) per day for seven (7) days, and at the end of those last seven (7) days, this is when the "Drug Holiday" would start.   Will I have withdrawals? By doing a "slow downward taper" like this one, it  is unlikely that you will experience any significant withdrawal symptoms. Typically, what triggers withdrawals is the sudden stop of a high dose opioid therapy. Withdrawals can usually be avoided by slowly decreasing the dose over a prolonged period of time. If you do not follow these instructions and decide to stop your medication abruptly, withdrawals may be possible.  What are withdrawals? Withdrawals: refers to the wide range of symptoms that occur after stopping or dramatically reducing opiate drugs after heavy and prolonged use. Withdrawal symptoms do not occur to patients that use low dose opioids, or those who take the medication sporadically. Contrary to benzodiazepine (example: Valium, Xanax, etc.) or alcohol withdrawals ("Delirium Tremens"), opioid withdrawals are not lethal. Withdrawals are the physical manifestation of the body getting rid of the excess receptors.  Expected Symptoms Early symptoms of withdrawal may include: Agitation Anxiety Muscle aches Increased tearing Insomnia Runny nose Sweating Yawning  Late symptoms of withdrawal may include: Abdominal cramping Diarrhea Dilated pupils Goose bumps Nausea Vomiting  Will I experience withdrawals? Due to the slow nature of the taper, it is very unlikely that you will experience any.  What is a slow taper? Taper: refers to the gradual decrease in dose.  (Last update: 02/06/2020) ____________________________________________________________________________________________    

## 2021-01-05 NOTE — Progress Notes (Signed)
Nursing Pain Medication Assessment:  Safety precautions to be maintained throughout the outpatient stay will include: orient to surroundings, keep bed in low position, maintain call bell within reach at all times, provide assistance with transfer out of bed and ambulation.  Medication Inspection Compliance: Pill count conducted under aseptic conditions, in front of the patient. Neither the pills nor the bottle was removed from the patient's sight at any time. Once count was completed pills were immediately returned to the patient in their original bottle.  Medication: Oxycodone IR Pill/Patch Count:  1 of 120 pills remain Pill/Patch Appearance: Markings consistent with prescribed medication Bottle Appearance: Standard pharmacy container. Clearly labeled. Filled Date: 05 / 21 / 2022 Last Medication intake:  Today

## 2021-01-08 ENCOUNTER — Telehealth: Payer: Self-pay

## 2021-01-08 NOTE — Telephone Encounter (Signed)
[employer] denied his MRI. They state the notes do not show you are looking for anything specific such as a broken bone or tear in hip.

## 2021-01-13 LAB — TOXASSURE SELECT 13 (MW), URINE

## 2021-05-03 NOTE — Progress Notes (Signed)
PROVIDER NOTE: Information contained herein reflects review and annotations entered in association with encounter. Interpretation of such information and data should be left to medically-trained personnel. Information provided to patient can be located elsewhere in the medical record under "Patient Instructions". Document created using STT-dictation technology, any transcriptional errors that may result from process are unintentional.    Patient: Nathan Lambert.  Service Category: E/M  Provider: Gaspar Cola, MD  DOB: August 22, 1980  DOS: 05/04/2021  Specialty: Interventional Pain Management  MRN: [id number]  Setting: Ambulatory outpatient  PCP: Vidal Schwalbe, MD  Type: Established Patient    Referring Provider: Vidal Schwalbe, MD  Location: Office  Delivery: Face-to-face     HPI  [name], a 40 y.o. year old male, is here today because of his Chronic bilateral low back pain without sciatica [M54.50, G89.29]. Nathan Lambert primary complain today is Back Pain Last encounter: My last encounter with him was on 01/05/2021. 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); Chronic lower extremity pain (3ry area of Pain) (Bilateral) (L>R); Lumbar facet hypertrophy (Bilateral); Lumbar facet syndrome (Bilateral) (R>L); Neurogenic pain; Spondylosis without myelopathy or radiculopathy, lumbar region; Other specified dorsopathies, sacral and sacrococcygeal region; Lumbar spondylosis; Chronic upper extremity pain (Left); Pain and numbness of left upper extremity;  Cervical radiculitis (C6/C7) (Left); DDD (degenerative disc disease), cervical; Cervicalgia; Osteoarthritis involving multiple joints; Chronic hip pain (4th area of Pain) (Bilateral) (L>R); Greater trochanteric bursitis (Left); Lumbar back pain with radiculopathy affecting left lower extremity (L5 dermatomal distribution); Subacute lumbar radiculopathy (L5) (Left); Chronic low back pain (1ry area of Pain) (Bilateral) w/o sciatica; Decreased range of motion of both hips; Chronic hip pain (Left); Enthesopathy of hip region (Left); Other bursitis of hip (Left); and Chronic groin pain (Left) on their pertinent problem list. Pain Assessment: Severity of Chronic pain is reported as a 3 /10. Location: Back Lower/pain radiaties down to his hip. Onset: More than a month ago. Quality: Stabbing, Aching, Constant, Burning. Timing: Constant. Modifying factor(s): meds and heating pad. Vitals:  height is 5' 11"  (1.803 m) and weight is 260 lb (117.9 kg). His temperature is 97.2 F (36.2 C) (abnormal). His blood pressure is 118/79 and his pulse is 75. His respiration is 17 and oxygen saturation is 97%.   Reason for encounter: medication management.   The patient indicates doing well with the current medication regimen. No adverse reactions or side effects reported to the medications.   RTCB: 08/04/2021 Nonopioids transferred 05/07/2020: Magnesium, vitamin D3, and calcium  Pharmacotherapy Assessment  Analgesic: Oxycodone IR 10 mg 4 times daily MME/day: 60 mg/day.   Monitoring: [place] PMP: PDMP reviewed during this encounter.       Pharmacotherapy: No side-effects or adverse reactions reported. Compliance: No problems identified. Effectiveness: Clinically acceptable.  Chauncey Fischer, RN  05/04/2021 12:59 PM  Sign when Signing Visit Nursing Pain Medication Assessment:  Safety precautions to be maintained throughout the outpatient stay will include: orient to surroundings, keep bed in low position, maintain call bell  within reach at all times, provide assistance with transfer out of bed and ambulation.  Medication Inspection Compliance: Pill count conducted under aseptic conditions, in front of the patient.  Neither the pills nor the bottle was removed from the patient's sight at any time. Once count was completed pills were immediately returned to the patient in their original bottle.  Medication: Oxycodone IR Pill/Patch Count:  9 of 120 pills remain Pill/Patch Appearance: Markings consistent with prescribed medication Bottle Appearance: Standard pharmacy container. Clearly labeled. Filled Date: 83 / 19 / 2022 Last Medication intake:  Today Safety precautions to be maintained throughout the outpatient stay will include: orient to surroundings, keep bed in low position, maintain call bell within reach at all times, provide assistance with transfer out of bed and ambulation.      UDS:  Summary  Date Value Ref Range Status  01/05/2021 Note  Final    Comment:    ==================================================================== [employer] (MW) ==================================================================== Test                             Result       Flag       Units  Drug Present and Declared for Prescription Verification   Oxycodone                      1450         EXPECTED   ng/mg creat   Oxymorphone                    2277         EXPECTED   ng/mg creat   Noroxycodone                   1814         EXPECTED   ng/mg creat   Noroxymorphone                 577          EXPECTED   ng/mg creat    Sources of oxycodone are scheduled prescription medications.    Oxymorphone, noroxycodone, and noroxymorphone are expected    metabolites of oxycodone. Oxymorphone is also available as a    scheduled prescription medication.  ==================================================================== Test                      Result    Flag   Units      Ref Range   Creatinine              133               mg/dL      >=20 ==================================================================== Declared Medications:  The flagging and interpretation on this report are based on the  following declared medications.  Unexpected results may arise from  inaccuracies in the declared medications.   **Note: The testing scope of this panel includes these medications:   Oxycodone   **Note: The testing scope of this panel does not include the  following reported medications:   Azelastine (Astelin)  Calcium  Clobetasol  Doxycycline (Doryx)  Econazole  Famotidine (Pepcid)  Fluticasone (Flonase)  Gentamicin  Loratadine (Claritin)  Magnesium  Metformin (Glucophage)  Montelukast (Singulair)  Naloxone (Narcan)  Terbinafine (Lamisil)  Testosterone  Vitamin D  Vitamin D3 ==================================================================== For clinical consultation, please call [phone]. ====================================================================      ROS  Constitutional: Denies any fever or chills Gastrointestinal: No reported hemesis, hematochezia, vomiting, or acute GI distress Musculoskeletal: Denies any acute onset joint swelling, redness, loss of ROM, or weakness Neurological:  No reported episodes of acute onset apraxia, aphasia, dysarthria, agnosia, amnesia, paralysis, loss of coordination, or loss of consciousness  Medication Review  GNP Calcium 1200, Magnesium, Magnesium Oxide, Oxycodone HCl, Vitamin D3, azelastine, calcium carbonate, clobetasol ointment, doxycycline, econazole nitrate, famotidine, fluticasone, gentamicin cream, loratadine, metFORMIN, montelukast, naloxone, terbinafine, and testosterone cypionate  History Review  Allergy: Nathan Lambert is allergic to bactrim [sulfamethoxazole-trimethoprim], pollen extract, and sulfa antibiotics. Drug: Nathan Lambert  reports no history of drug use. Alcohol:  reports no history of alcohol use. Tobacco:  reports  that he has been smoking cigarettes. He has a 10.00 pack-year smoking history. He has never used smokeless tobacco. Social: Nathan Lambert  reports that he has been smoking cigarettes. He has a 10.00 pack-year smoking history. He has never used smokeless tobacco. He reports that he does not drink alcohol and does not use drugs. Medical:  has a past medical history of Allergy (2012), Chronic low back pain (Primary Area of Pain) (Bilateral) (R>L), Degeneration of lumbar or lumbosacral intervertebral disc (L4-L5) (12/27/2016), Headache, and Opiate use (02/15/2017). Surgical: Nathan Lambert  has a past surgical history that includes Fracture surgery (Left); Spine surgery (Right, 06/16/2015); and Back surgery. Family: family history includes AAA (abdominal aortic aneurysm) in his mother; Diabetes in his father; Stroke in his mother.  Laboratory Chemistry Profile   Renal Lab Results  Component Value Date   BUN 10 06/07/2019   CREATININE 0.76 06/07/2019   BCR 7 (L) 12/27/2016   GFRAA >60 06/07/2019   GFRNONAA >60 06/07/2019    Hepatic Lab Results  Component Value Date   AST 14 (L) 06/07/2019   ALT 17 06/07/2019   ALBUMIN 4.0 06/07/2019   ALKPHOS 50 06/07/2019    Electrolytes Lab Results  Component Value Date   NA 138 06/07/2019   K 4.3 06/07/2019   CL 104 06/07/2019   CALCIUM 9.0 06/07/2019   MG 2.0 06/07/2019    Bone Lab Results  Component Value Date   VD25OH 15.51 (L) 06/07/2019   25OHVITD1 17 (L) 12/27/2016   25OHVITD2 <1.0 12/27/2016   25OHVITD3 17 12/27/2016   TESTOFREE 2.6 (L) 06/08/2017   TESTOSTERONE 116 (L) 06/08/2017    Inflammation (CRP: Acute Phase) (ESR: Chronic Phase) Lab Results  Component Value Date   CRP 2.7 (H) 06/07/2019   ESRSEDRATE 17 (H) 06/07/2019         Note: Above Lab results reviewed.  Recent Imaging Review  DG PAIN CLINIC C-ARM 1-60 MIN NO REPORT Fluoro was used, but no Radiologist interpretation will be provided.  Please refer to "NOTES"  tab for provider progress note. Note: Reviewed        Physical Exam  General appearance: Well nourished, well developed, and well hydrated. In no apparent acute distress Mental status: Alert, oriented x 3 (person, place, & time)       Respiratory: No evidence of acute respiratory distress Eyes: PERLA Vitals: BP 118/79   Pulse 75   Temp (!) 97.2 F (36.2 C)   Resp 17   Ht 5' 11"  (1.803 m)   Wt 260 lb (117.9 kg)   SpO2 97%   BMI 36.26 kg/m  BMI: Estimated body mass index is 36.26 kg/m as calculated from the following:   Height as of this encounter: 5' 11"  (1.803 m).   Weight as of this encounter: 260 lb (117.9 kg). Ideal: Ideal body weight: 75.3 kg (166 lb 0.1 oz) Adjusted ideal body weight: 92.4 kg (203 lb 9.7 oz)  Assessment   Status  Diagnosis  Controlled Controlled Controlled 1. Chronic low back pain (1ry area of Pain) (Bilateral) w/o sciatica   2. Chronic knee pain (2ry area of Pain) (Left)   3. Chronic lower extremity pain (3ry area of Pain) (Bilateral) (L>R)   4. Chronic hip pain (4th area of Pain) (Bilateral) (L>R)   5. 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 Chronic pain syndrome   7. Pharmacologic therapy   8. Chronic use of opiate for therapeutic purpose   9. Encounter for chronic pain management   10. Encounter for medication management      Updated Problems: No problems updated.  Plan of Care  Problem-specific:  No problem-specific Assessment & Plan notes found for this encounter.  Mr. Rain Friedt. has a current medication list which includes the following long-term medication(s): azelastine, famotidine, fluticasone, loratadine, magnesium oxide, metformin, montelukast, naloxone, [START ON 05/06/2021] oxycodone hcl, [START ON 06/05/2021] oxycodone hcl, [START ON 07/05/2021] oxycodone hcl, calcium carbonate, gnp calcium 1200, vitamin d3, and magnesium.  Pharmacotherapy (Medications Ordered): Meds ordered this encounter   Medications   Oxycodone HCl 10 MG TABS    Sig: Take 1 tablet (10 mg total) by mouth every 6 (six) hours as needed. Must last 30 days    Dispense:  120 tablet    Refill:  0    DO NOT: delete (not duplicate); no partial-fill (will deny script to complete), no refill request (F/U required). DISPENSE: 1 day early if closed on fill date. WARN: No CNS-depressants within 8 hrs of med.   Oxycodone HCl 10 MG TABS    Sig: Take 1 tablet (10 mg total) by mouth every 6 (six) hours as needed. Must last 30 days    Dispense:  120 tablet    Refill:  0    DO NOT: delete (not duplicate); no partial-fill (will deny script to complete), no refill request (F/U required). DISPENSE: 1 day early if closed on fill date. WARN: No CNS-depressants within 8 hrs of med.   Oxycodone HCl 10 MG TABS    Sig: Take 1 tablet (10 mg total) by mouth every 6 (six) hours as needed. Must last 30 days    Dispense:  120 tablet    Refill:  0    DO NOT: delete (not duplicate); no partial-fill (will deny script to complete), no refill request (F/U required). DISPENSE: 1 day early if closed on fill date. WARN: No CNS-depressants within 8 hrs of med.    Orders:  No orders of the defined types were placed in this encounter.  Follow-up plan:   Return in about 3 months (around 08/04/2021) for Eval-day (M,W), (F2F), (MM).     Interventional Therapies  Risk  Complexity Considerations:   Estimated body mass index is 36.26 kg/m as calculated from the following:   Height as of this encounter: 5' 11"  (1.803 m).   Weight as of this encounter: 260 lb (117.9 kg). WNL   Planned  Pending:      Under consideration:   Possible left SI joint RFA Diagnostic caudal ESI + diagnostic epidurogram  Possible Racz procedure  Diagnostic left IA knee injection (w/ steroid)  Possible left IA Hyalgan knee injections  Diagnostic left Genicular NB  Possible left Genicular nerve RFA    Completed:   Diagnostic/therapeutic left IA hip joint + bursa  injection x1 (11/25/2020) (100/100/0/0)  Therapeutic right lumbar facet MBB x3 (05/15/2020) (100/100/50/50)  Therapeutic left lumbar facet MBB x3 (05/15/2020) 100/100/50/50)  Therapeutic right lumbar facet  RFA x1 (10/11/2017) NR Therapeutic left lumbar facet RFA x1 (11/24/2017) NR Therapeutic right SI joint block x1 (05/19/2017)  Therapeutic left SI joint block x1 (05/19/2017)  Therapeutic right SI joint RFA x1 (10/11/2017)  Therapeutic right L4 TFES2 x1 (05/30/2018)    Therapeutic  Palliative (PRN) options:   Palliative bilateral lumbar facet block #4 Palliative bilateral SI joint block #2  Diagnostic right L4 TFES2 #2     Recent Visits No visits were found meeting these conditions. Showing recent visits within past 90 days and meeting all other requirements Today's Visits Date Type Provider Dept  05/04/21 Office Visit Milinda Pointer, MD [facility]  Showing today's visits and meeting all other requirements Future Appointments No visits were found meeting these conditions. Showing future appointments within next 90 days and meeting all other requirements I discussed the assessment and treatment plan with the patient. The patient was provided an opportunity to ask questions and all were answered. The patient agreed with the plan and demonstrated an understanding of the instructions.  Patient advised to call back or seek an in-person evaluation if the symptoms or condition worsens.  Duration of encounter: 30 minutes.  Note by: Gaspar Cola, MD Date: 05/04/2021; Time: 1:14 PM

## 2021-05-04 ENCOUNTER — Ambulatory Visit: Payer: BLUE CROSS/BLUE SHIELD | Attending: Pain Medicine | Admitting: Pain Medicine

## 2021-05-04 ENCOUNTER — Other Ambulatory Visit: Payer: Self-pay

## 2021-05-04 ENCOUNTER — Encounter: Payer: Self-pay | Admitting: Pain Medicine

## 2021-05-04 VITALS — BP 118/79 | HR 75 | Temp 97.2°F | Resp 17 | Ht 71.0 in | Wt 260.0 lb

## 2021-05-04 DIAGNOSIS — M79604 Pain in right leg: Secondary | ICD-10-CM | POA: Insufficient documentation

## 2021-05-04 DIAGNOSIS — G8929 Other chronic pain: Secondary | ICD-10-CM | POA: Diagnosis present

## 2021-05-04 DIAGNOSIS — Z79891 Long term (current) use of opiate analgesic: Secondary | ICD-10-CM | POA: Insufficient documentation

## 2021-05-04 DIAGNOSIS — M79605 Pain in left leg: Secondary | ICD-10-CM | POA: Insufficient documentation

## 2021-05-04 DIAGNOSIS — M25552 Pain in left hip: Secondary | ICD-10-CM | POA: Diagnosis present

## 2021-05-04 DIAGNOSIS — M961 Postlaminectomy syndrome, not elsewhere classified: Secondary | ICD-10-CM | POA: Diagnosis present

## 2021-05-04 DIAGNOSIS — Z79899 Other long term (current) drug therapy: Secondary | ICD-10-CM | POA: Diagnosis present

## 2021-05-04 DIAGNOSIS — M25562 Pain in left knee: Secondary | ICD-10-CM | POA: Diagnosis not present

## 2021-05-04 DIAGNOSIS — G894 Chronic pain syndrome: Secondary | ICD-10-CM | POA: Diagnosis present

## 2021-05-04 DIAGNOSIS — M545 Low back pain, unspecified: Secondary | ICD-10-CM | POA: Insufficient documentation

## 2021-05-04 DIAGNOSIS — M25551 Pain in right hip: Secondary | ICD-10-CM | POA: Insufficient documentation

## 2021-05-04 MED ORDER — OXYCODONE HCL 10 MG PO TABS: 10.0000 mg | ORAL_TABLET | Freq: Four times a day (QID) | ORAL | 0 refills | Status: DC | PRN

## 2021-05-04 MED ORDER — OXYCODONE HCL 10 MG PO TABS
10.0000 mg | ORAL_TABLET | Freq: Four times a day (QID) | ORAL | 0 refills | Status: DC | PRN
Start: 2021-07-05 — End: 2021-08-03

## 2021-05-04 NOTE — Progress Notes (Signed)
Nursing Pain Medication Assessment:  Safety precautions to be maintained throughout the outpatient stay will include: orient to surroundings, keep bed in low position, maintain call bell within reach at all times, provide assistance with transfer out of bed and ambulation.  Medication Inspection Compliance: Pill count conducted under aseptic conditions, in front of the patient. Neither the pills nor the bottle was removed from the patient's sight at any time. Once count was completed pills were immediately returned to the patient in their original bottle.  Medication: Oxycodone IR Pill/Patch Count:  9 of 120 pills remain Pill/Patch Appearance: Markings consistent with prescribed medication Bottle Appearance: Standard pharmacy container. Clearly labeled. Filled Date: 4 / 19 / 2022 Last Medication intake:  Today Safety precautions to be maintained throughout the outpatient stay will include: orient to surroundings, keep bed in low position, maintain call bell within reach at all times, provide assistance with transfer out of bed and ambulation.

## 2021-05-04 NOTE — Patient Instructions (Signed)

## 2021-08-02 NOTE — Progress Notes (Signed)
PROVIDER NOTE: Information contained herein reflects review and annotations entered in association with encounter. Interpretation of such information and data should be left to medically-trained personnel. Information provided to patient can be located elsewhere in the medical record under "Patient Instructions". Document created using STT-dictation technology, any transcriptional errors that may result from process are unintentional.    Patient: Nathan Lambert.  Service Category: E/M  Provider: Gaspar Cola, MD  DOB: 1980-08-30  DOS: 08/03/2021  Specialty: Interventional Pain Management  MRN: [id number]  Setting: Ambulatory outpatient  PCP: Nathan Schwalbe, MD  Type: Established Patient    Referring Provider: Vidal Schwalbe, MD  Location: Office  Delivery: Face-to-face     HPI  [name], a 41 y.o. year old male, is here today because of his Chronic pain syndrome [G89.4]. [name] primary complain today is Back Pain Last encounter: My last encounter with him was on 05/04/2021. Pertinent problems: 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); Chronic lower extremity pain (3ry area of Pain) (Bilateral) (L>R); Lumbar facet hypertrophy (Bilateral); Lumbar facet syndrome (Bilateral) (R>L); Neurogenic pain; Spondylosis without myelopathy or radiculopathy, lumbar region; Other specified dorsopathies, sacral and sacrococcygeal region; Lumbar spondylosis; Chronic upper extremity pain (Left); Pain and numbness of left upper extremity; Cervical radiculitis (C6/C7)  (Left); DDD (degenerative disc disease), cervical; Cervicalgia; Osteoarthritis involving multiple joints; Chronic hip pain (4th area of Pain) (Bilateral) (L>R); Greater trochanteric bursitis (Left); Lumbar back pain with radiculopathy affecting left lower extremity (L5 dermatomal distribution); Subacute lumbar radiculopathy (L5) (Left); Chronic low back pain (1ry area of Pain) (Bilateral) w/o sciatica; Decreased range of motion of both hips; Chronic hip pain (Left); Enthesopathy of hip region (Left); Other bursitis of hip (Left); and Chronic groin pain (Left) on their pertinent problem list. Pain Assessment: Severity of Chronic pain is reported as a 2 /10. Location: Back Right/ . Onset: More than a month ago. Quality: Discomfort, Aching, Burning, Shooting, Throbbing. Timing: Constant. Modifying factor(s): meds, heating pad. Vitals:  height is 5' 11" (1.803 m) and weight is 264 lb (119.7 kg). His temperature is 97.3 F (36.3 C) (abnormal). His blood pressure is 132/79 and his pulse is 82. His respiration is 16 and oxygen saturation is 98%.   Reason for encounter: medication management.   The patient indicates doing well with the current medication regimen. No adverse reactions or side effects reported to the medications.  Today the patient had several questions regarding CBD.  All of them were answered to the patient's satisfaction and I have provided him with written information regarding that subject.  RTCB: 11/02/2021 Nonopioids transferred 05/07/2020: Magnesium, vitamin D3, and calcium  Pharmacotherapy Assessment  Analgesic: Oxycodone IR 10 mg 4 times daily. MME/day: 60 mg/day.   Monitoring: Nathan Lambert PMP: PDMP reviewed during this encounter.       Pharmacotherapy: No side-effects or adverse reactions reported. Compliance: No problems identified. Effectiveness: Clinically acceptable.  Nathan Fischer, RN  08/03/2021  1:04 PM  Sign when Signing Visit Nursing Pain Medication Assessment:  Safety  precautions to be maintained throughout the outpatient stay will include: orient to surroundings, keep bed in low position, maintain call bell within reach at all times, provide assistance with  transfer out of bed and ambulation.  Medication Inspection Compliance: Pill count conducted under aseptic conditions, in front of the patient. Neither the pills nor the bottle was removed from the patient's sight at any time. Once count was completed pills were immediately returned to the patient in their original bottle.  Medication: Oxycodone IR Pill/Patch Count:  13 of 120 pills remain Pill/Patch Appearance: Markings consistent with prescribed medication Bottle Appearance: Standard pharmacy container. Clearly labeled. Filled Date: 58 / 19 / 2022 Last Medication intake:  TodaySafety precautions to be maintained throughout the outpatient stay will include: orient to surroundings, keep bed in low position, maintain call bell within reach at all times, provide assistance with transfer out of bed and ambulation.     UDS:  Summary  Date Value Ref Range Status  01/05/2021 Note  Final    Comment:    ==================================================================== [facility] 13 (MW) ==================================================================== Test                             Result       Flag       Units  Drug Present and Declared for Prescription Verification   Oxycodone                      1450         EXPECTED   ng/mg creat   Oxymorphone                    2277         EXPECTED   ng/mg creat   Noroxycodone                   1814         EXPECTED   ng/mg creat   Noroxymorphone                 577          EXPECTED   ng/mg creat    Sources of oxycodone are scheduled prescription medications.    Oxymorphone, noroxycodone, and noroxymorphone are expected    metabolites of oxycodone. Oxymorphone is also available as a    scheduled prescription  medication.  ==================================================================== Test                      Result    Flag   Units      Ref Range   Creatinine              133              mg/dL      >=20 ==================================================================== Declared Medications:  The flagging and interpretation on this report are based on the  following declared medications.  Unexpected results may arise from  inaccuracies in the declared medications.   **Note: The testing scope of this panel includes these medications:   Oxycodone   **Note: The testing scope of this panel does not include the  following reported medications:   Azelastine (Astelin)  Calcium  Clobetasol  Doxycycline (Doryx)  Econazole  Famotidine (Pepcid)  Fluticasone (Flonase)  Gentamicin  Loratadine (Claritin)  Magnesium  Metformin (Glucophage)  Montelukast (Singulair)  Naloxone (Narcan)  Terbinafine (Lamisil)  Testosterone  Vitamin D  Vitamin D3 ==================================================================== For clinical consultation, please call [phone]. ====================================================================      ROS  Constitutional: Denies any fever or chills Gastrointestinal: No reported hemesis, hematochezia,  vomiting, or acute GI distress Musculoskeletal: Denies any acute onset joint swelling, redness, loss of ROM, or weakness Neurological: No reported episodes of acute onset apraxia, aphasia, dysarthria, agnosia, amnesia, paralysis, loss of coordination, or loss of consciousness  Medication Review  GNP Calcium 1200, Magnesium, Magnesium Oxide, Oxycodone HCl, Vitamin D3, azelastine, calcium carbonate, clobetasol ointment, econazole nitrate, famotidine, fluticasone, gentamicin cream, loratadine, metFORMIN, montelukast, naloxone, terbinafine, and testosterone cypionate  History Review  Allergy: [name] is allergic to bactrim  [sulfamethoxazole-trimethoprim], pollen extract, and sulfa antibiotics. Drug: [name]  reports no history of drug use. Alcohol:  reports no history of alcohol use. Tobacco:  reports that he has been smoking cigarettes. He has a 10.00 pack-year smoking history. He has never used smokeless tobacco. Social: [name]  reports that he has been smoking cigarettes. He has a 10.00 pack-year smoking history. He has never used smokeless tobacco. He reports that he does not drink alcohol and does not use drugs. Medical:  has a past medical history of Allergy (2012), Chronic low back pain (Primary Area of Pain) (Bilateral) (R>L), Degeneration of lumbar or lumbosacral intervertebral disc (L4-L5) (12/27/2016), Headache, and Opiate use (02/15/2017). Surgical: Mr. Halfmann  has a past surgical history that includes Fracture surgery (Left); Spine surgery (Right, 06/16/2015); and Back surgery. Family: family history includes AAA (abdominal aortic aneurysm) in his mother; Diabetes in his father; Stroke in his mother.  Laboratory Chemistry Profile   Renal Lab Results  Component Value Date   BUN 10 06/07/2019   CREATININE 0.76 06/07/2019   BCR 7 (L) 12/27/2016   GFRAA >60 06/07/2019   GFRNONAA >60 06/07/2019    Hepatic Lab Results  Component Value Date   AST 14 (L) 06/07/2019   ALT 17 06/07/2019   ALBUMIN 4.0 06/07/2019   ALKPHOS 50 06/07/2019    Electrolytes Lab Results  Component Value Date   NA 138 06/07/2019   K 4.3 06/07/2019   CL 104 06/07/2019   CALCIUM 9.0 06/07/2019   MG 2.0 06/07/2019    Bone Lab Results  Component Value Date   VD25OH 15.51 (L) 06/07/2019   25OHVITD1 17 (L) 12/27/2016   25OHVITD2 <1.0 12/27/2016   25OHVITD3 17 12/27/2016   TESTOFREE 2.6 (L) 06/08/2017   TESTOSTERONE 116 (L) 06/08/2017    Inflammation (CRP: Acute Phase) (ESR: Chronic Phase) Lab Results  Component Value Date   CRP 2.7 (H) 06/07/2019   ESRSEDRATE 17 (H) 06/07/2019         Note:  Above Lab results reviewed.  Recent Imaging Review  DG PAIN CLINIC C-ARM 1-60 MIN NO REPORT Fluoro was used, but no Radiologist interpretation will be provided.  Please refer to "NOTES" tab for provider progress note. Note: Reviewed        Physical Exam  General appearance: Well nourished, well developed, and well hydrated. In no apparent acute distress Mental status: Alert, oriented x 3 (person, place, & time)       Respiratory: No evidence of acute respiratory distress Eyes: PERLA Vitals: BP 132/79    Pulse 82    Temp (!) 97.3 F (36.3 C)    Resp 16    Ht 5' 11" (1.803 m)    Wt 264 lb (119.7 kg)    SpO2 98%    BMI 36.82 kg/m  BMI: Estimated body mass index is 36.82 kg/m as calculated from the following:   Height as of this encounter: 5' 11" (1.803 m).   Weight as of this encounter: 264 lb (119.7 kg).  Ideal: Ideal body weight: 75.3 kg (166 lb 0.1 oz) Adjusted ideal body weight: 93.1 kg (205 lb 3.3 oz)  Assessment   Status Diagnosis  Controlled Controlled Controlled 1. Chronic pain syndrome   2. Pharmacologic therapy   3. Chronic use of opiate for therapeutic purpose   4. Chronic low back pain (1ry area of Pain) (Bilateral) w/o sciatica   5. Chronic knee pain (2ry area of Pain) (Left)   6. Chronic lower extremity pain (3ry area of Pain) (Bilateral) (L>R)   7. Chronic hip pain (4th area of Pain) (Bilateral) (L>R)   8. 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 Encounter for chronic pain management   10. Encounter for medication management      Updated Problems: No problems updated.  Plan of Care  Problem-specific:  No problem-specific Assessment & Plan notes found for this encounter.  Mr. Naren Benally. has a current medication list which includes the following long-term medication(s): azelastine, famotidine, fluticasone, loratadine, metformin, montelukast, naloxone, [START ON 08/04/2021] oxycodone hcl, [START ON 09/03/2021] oxycodone hcl, [START ON  10/03/2021] oxycodone hcl, calcium carbonate, gnp calcium 1200, vitamin d3, magnesium, and magnesium oxide.  Pharmacotherapy (Medications Ordered): Meds ordered this encounter  Medications   Oxycodone HCl 10 MG TABS    Sig: Take 1 tablet (10 mg total) by mouth every 6 (six) hours as needed. Must last 30 days    Dispense:  120 tablet    Refill:  0    DO NOT: delete (not duplicate); no partial-fill (will deny script to complete), no refill request (F/U required). DISPENSE: 1 day early if closed on fill date. WARN: No CNS-depressants within 8 hrs of med.   Oxycodone HCl 10 MG TABS    Sig: Take 1 tablet (10 mg total) by mouth every 6 (six) hours as needed. Must last 30 days    Dispense:  120 tablet    Refill:  0    DO NOT: delete (not duplicate); no partial-fill (will deny script to complete), no refill request (F/U required). DISPENSE: 1 day early if closed on fill date. WARN: No CNS-depressants within 8 hrs of med.   Oxycodone HCl 10 MG TABS    Sig: Take 1 tablet (10 mg total) by mouth every 6 (six) hours as needed. Must last 30 days    Dispense:  120 tablet    Refill:  0    DO NOT: delete (not duplicate); no partial-fill (will deny script to complete), no refill request (F/U required). DISPENSE: 1 day early if closed on fill date. WARN: No CNS-depressants within 8 hrs of med.   Orders:  No orders of the defined types were placed in this encounter.  Follow-up plan:   Return in about 13 weeks (around 11/02/2021) for Eval-day (M,W), (F2F), (MM).     Interventional Therapies  Risk   Complexity Considerations:   Estimated body mass index is 36.26 kg/m as calculated from the following:   Height as of this encounter: 5' 11" (1.803 m).   Weight as of this encounter: 260 lb (117.9 kg). WNL   Planned   Pending:      Under consideration:   Possible left SI joint RFA Diagnostic caudal ESI + diagnostic epidurogram  Possible Racz procedure  Diagnostic left IA knee injection (w/ steroid)   Possible left IA Hyalgan knee injections  Diagnostic left Genicular NB  Possible left Genicular nerve RFA    Completed:   Diagnostic/therapeutic left IA hip joint + bursa injection  x1 (11/25/2020) (100/100/0/0)  Therapeutic right lumbar facet MBB x3 (05/15/2020) (100/100/50/50)  Therapeutic left lumbar facet MBB x3 (05/15/2020) 100/100/50/50)  Therapeutic right lumbar facet RFA x1 (10/11/2017) NR Therapeutic left lumbar facet RFA x1 (11/24/2017) NR Therapeutic right SI joint block x1 (05/19/2017)  Therapeutic left SI joint block x1 (05/19/2017)  Therapeutic right SI joint RFA x1 (10/11/2017)  Therapeutic right L4 TFES2 x1 (05/30/2018)    Therapeutic   Palliative (PRN) options:   Palliative bilateral lumbar facet block #4 Palliative bilateral SI joint block #2  Diagnostic right L4 TFES2 #2     Recent Visits No visits were found meeting these conditions. Showing recent visits within past 90 days and meeting all other requirements Today's Visits Date Type Provider Dept  08/03/21 Office Visit Milinda Pointer, MD [facility]  Showing today's visits and meeting all other requirements Future Appointments Date Type Provider Dept  10/26/21 Appointment Milinda Pointer, MD [facility]  Showing future appointments within next 90 days and meeting all other requirements  I discussed the assessment and treatment plan with the patient. The patient was provided an opportunity to ask questions and all were answered. The patient agreed with the plan and demonstrated an understanding of the instructions.  Patient advised to call back or seek an in-person evaluation if the symptoms or condition worsens.  Duration of encounter: 30 minutes.  Note by: Nathan Cola, MD Date: 08/03/2021; Time: 1:44 PM

## 2021-08-03 ENCOUNTER — Other Ambulatory Visit: Payer: Self-pay

## 2021-08-03 ENCOUNTER — Ambulatory Visit: Payer: 59 | Attending: Pain Medicine | Admitting: Pain Medicine

## 2021-08-03 ENCOUNTER — Encounter: Payer: Self-pay | Admitting: Pain Medicine

## 2021-08-03 VITALS — BP 132/79 | HR 82 | Temp 97.3°F | Resp 16 | Ht 71.0 in | Wt 264.0 lb

## 2021-08-03 DIAGNOSIS — M25551 Pain in right hip: Secondary | ICD-10-CM | POA: Insufficient documentation

## 2021-08-03 DIAGNOSIS — M79605 Pain in left leg: Secondary | ICD-10-CM | POA: Insufficient documentation

## 2021-08-03 DIAGNOSIS — M25562 Pain in left knee: Secondary | ICD-10-CM | POA: Insufficient documentation

## 2021-08-03 DIAGNOSIS — M79604 Pain in right leg: Secondary | ICD-10-CM | POA: Diagnosis present

## 2021-08-03 DIAGNOSIS — Z79899 Other long term (current) drug therapy: Secondary | ICD-10-CM | POA: Insufficient documentation

## 2021-08-03 DIAGNOSIS — G8929 Other chronic pain: Secondary | ICD-10-CM | POA: Insufficient documentation

## 2021-08-03 DIAGNOSIS — M545 Low back pain, unspecified: Secondary | ICD-10-CM | POA: Diagnosis present

## 2021-08-03 DIAGNOSIS — M961 Postlaminectomy syndrome, not elsewhere classified: Secondary | ICD-10-CM | POA: Insufficient documentation

## 2021-08-03 DIAGNOSIS — Z79891 Long term (current) use of opiate analgesic: Secondary | ICD-10-CM | POA: Diagnosis present

## 2021-08-03 DIAGNOSIS — G894 Chronic pain syndrome: Secondary | ICD-10-CM | POA: Diagnosis present

## 2021-08-03 DIAGNOSIS — M25552 Pain in left hip: Secondary | ICD-10-CM | POA: Insufficient documentation

## 2021-08-03 MED ORDER — OXYCODONE HCL 10 MG PO TABS: 10.0000 mg | ORAL_TABLET | Freq: Four times a day (QID) | ORAL | 0 refills | Status: DC | PRN

## 2021-08-03 NOTE — Progress Notes (Signed)
Nursing Pain Medication Assessment:  Safety precautions to be maintained throughout the outpatient stay will include: orient to surroundings, keep bed in low position, maintain call bell within reach at all times, provide assistance with transfer out of bed and ambulation.  Medication Inspection Compliance: Pill count conducted under aseptic conditions, in front of the patient. Neither the pills nor the bottle was removed from the patient's sight at any time. Once count was completed pills were immediately returned to the patient in their original bottle.  Medication: Oxycodone IR Pill/Patch Count:  13 of 120 pills remain Pill/Patch Appearance: Markings consistent with prescribed medication Bottle Appearance: Standard pharmacy container. Clearly labeled. Filled Date: 32 / 19 / 2022 Last Medication intake:  TodaySafety precautions to be maintained throughout the outpatient stay will include: orient to surroundings, keep bed in low position, maintain call bell within reach at all times, provide assistance with transfer out of bed and ambulation.

## 2021-08-03 NOTE — Patient Instructions (Signed)

## 2021-10-26 ENCOUNTER — Encounter: Payer: Self-pay | Admitting: Pain Medicine

## 2021-10-26 ENCOUNTER — Ambulatory Visit: Payer: 59 | Attending: Pain Medicine | Admitting: Pain Medicine

## 2021-10-26 VITALS — BP 124/86 | HR 83 | Temp 97.4°F | Ht 71.0 in | Wt 270.0 lb

## 2021-10-26 DIAGNOSIS — M79605 Pain in left leg: Secondary | ICD-10-CM | POA: Diagnosis present

## 2021-10-26 DIAGNOSIS — M545 Low back pain, unspecified: Secondary | ICD-10-CM | POA: Insufficient documentation

## 2021-10-26 DIAGNOSIS — Z79899 Other long term (current) drug therapy: Secondary | ICD-10-CM | POA: Diagnosis present

## 2021-10-26 DIAGNOSIS — R7989 Other specified abnormal findings of blood chemistry: Secondary | ICD-10-CM | POA: Diagnosis present

## 2021-10-26 DIAGNOSIS — L309 Dermatitis, unspecified: Secondary | ICD-10-CM | POA: Insufficient documentation

## 2021-10-26 DIAGNOSIS — E291 Testicular hypofunction: Secondary | ICD-10-CM | POA: Diagnosis present

## 2021-10-26 DIAGNOSIS — Z6838 Body mass index (BMI) 38.0-38.9, adult: Secondary | ICD-10-CM | POA: Insufficient documentation

## 2021-10-26 DIAGNOSIS — N492 Inflammatory disorders of scrotum: Secondary | ICD-10-CM | POA: Insufficient documentation

## 2021-10-26 DIAGNOSIS — M25552 Pain in left hip: Secondary | ICD-10-CM | POA: Diagnosis present

## 2021-10-26 DIAGNOSIS — G8929 Other chronic pain: Secondary | ICD-10-CM | POA: Diagnosis present

## 2021-10-26 DIAGNOSIS — Z79891 Long term (current) use of opiate analgesic: Secondary | ICD-10-CM | POA: Diagnosis present

## 2021-10-26 DIAGNOSIS — J301 Allergic rhinitis due to pollen: Secondary | ICD-10-CM | POA: Insufficient documentation

## 2021-10-26 DIAGNOSIS — G894 Chronic pain syndrome: Secondary | ICD-10-CM

## 2021-10-26 DIAGNOSIS — K219 Gastro-esophageal reflux disease without esophagitis: Secondary | ICD-10-CM

## 2021-10-26 DIAGNOSIS — J309 Allergic rhinitis, unspecified: Secondary | ICD-10-CM | POA: Insufficient documentation

## 2021-10-26 DIAGNOSIS — N522 Drug-induced erectile dysfunction: Secondary | ICD-10-CM

## 2021-10-26 DIAGNOSIS — R37 Sexual dysfunction, unspecified: Secondary | ICD-10-CM

## 2021-10-26 DIAGNOSIS — M961 Postlaminectomy syndrome, not elsewhere classified: Secondary | ICD-10-CM | POA: Diagnosis present

## 2021-10-26 DIAGNOSIS — M25562 Pain in left knee: Secondary | ICD-10-CM | POA: Insufficient documentation

## 2021-10-26 DIAGNOSIS — M79604 Pain in right leg: Secondary | ICD-10-CM | POA: Insufficient documentation

## 2021-10-26 DIAGNOSIS — L409 Psoriasis, unspecified: Secondary | ICD-10-CM | POA: Insufficient documentation

## 2021-10-26 DIAGNOSIS — L732 Hidradenitis suppurativa: Secondary | ICD-10-CM | POA: Insufficient documentation

## 2021-10-26 DIAGNOSIS — R7303 Prediabetes: Secondary | ICD-10-CM | POA: Insufficient documentation

## 2021-10-26 DIAGNOSIS — M25551 Pain in right hip: Secondary | ICD-10-CM | POA: Diagnosis present

## 2021-10-26 DIAGNOSIS — Z720 Tobacco use: Secondary | ICD-10-CM | POA: Insufficient documentation

## 2021-10-26 DIAGNOSIS — M5416 Radiculopathy, lumbar region: Secondary | ICD-10-CM | POA: Insufficient documentation

## 2021-10-26 DIAGNOSIS — R519 Headache, unspecified: Secondary | ICD-10-CM | POA: Insufficient documentation

## 2021-10-26 DIAGNOSIS — M5412 Radiculopathy, cervical region: Secondary | ICD-10-CM | POA: Insufficient documentation

## 2021-10-26 DIAGNOSIS — E782 Mixed hyperlipidemia: Secondary | ICD-10-CM | POA: Insufficient documentation

## 2021-10-26 HISTORY — DX: Mixed hyperlipidemia: E78.2

## 2021-10-26 HISTORY — DX: Gastro-esophageal reflux disease without esophagitis: K21.9

## 2021-10-26 MED ORDER — OXYCODONE HCL 10 MG PO TABS: 10.0000 mg | ORAL_TABLET | Freq: Four times a day (QID) | ORAL | 0 refills | Status: DC | PRN

## 2021-10-26 NOTE — Patient Instructions (Signed)
____________________________________________________________________________________________ ? ?Medication Rules ? ?Purpose: To inform patients, and their family members, of our rules and regulations. ? ?Applies to: All patients receiving prescriptions (written or electronic). ? ?Pharmacy of record: Pharmacy where electronic prescriptions will be sent. If written prescriptions are taken to a different pharmacy, please inform the nursing staff. The pharmacy listed in the electronic medical record should be the one where you would like electronic prescriptions to be sent. ? ?Electronic prescriptions: In compliance with the [place] Strengthen Opioid Misuse Prevention (STOP) Act of 2017 (Session Law 2017-74/H243), effective July 19, 2018, all controlled substances must be electronically prescribed. Calling prescriptions to the pharmacy will cease to exist. ? ?Prescription refills: Only during scheduled appointments. Applies to all prescriptions. ? ?NOTE: The following applies primarily to controlled substances (Opioid* Pain Medications).  ? ?Type of encounter (visit): For patients receiving controlled substances, face-to-face visits are required. (Not an option or up to the patient.) ? ?Patient's responsibilities: ?Pain Pills: Bring all pain pills to every appointment (except for procedure appointments). ?Pill Bottles: Bring pills in original pharmacy bottle. Always bring the newest bottle. Bring bottle, even if empty. ?Medication refills: You are responsible for knowing and keeping track of what medications you take and those you need refilled. ?The day before your appointment: write a list of all prescriptions that need to be refilled. ?The day of the appointment: give the list to the admitting nurse. Prescriptions will be written only during appointments. No prescriptions will be written on procedure days. ?If you forget a medication: it will not be "Called in", "Faxed", or "electronically sent". You will  need to get another appointment to get these prescribed. ?No early refills. Do not call asking to have your prescription filled early. ?Prescription Accuracy: You are responsible for carefully inspecting your prescriptions before leaving our office. Have the discharge nurse carefully go over each prescription with you, before taking them home. Make sure that your name is accurately spelled, that your address is correct. Check the name and dose of your medication to make sure it is accurate. Check the number of pills, and the written instructions to make sure they are clear and accurate. Make sure that you are given enough medication to last until your next medication refill appointment. ?Taking Medication: Take medication as prescribed. When it comes to controlled substances, taking less pills or less frequently than prescribed is permitted and encouraged. ?Never take more pills than instructed. ?Never take medication more frequently than prescribed.  ?Inform other Doctors: Always inform, all of your healthcare providers, of all the medications you take. ?Pain Medication from other Providers: You are not allowed to accept any additional pain medication from any other Doctor or Healthcare provider. There are two exceptions to this rule. (see below) In the event that you require additional pain medication, you are responsible for notifying us, as stated below. ?Cough Medicine: Often these contain an opioid, such as codeine or hydrocodone. Never accept or take cough medicine containing these opioids if you are already taking an opioid* medication. The combination may cause respiratory failure and death. ?Medication Agreement: You are responsible for carefully reading and following our Medication Agreement. This must be signed before receiving any prescriptions from our practice. Safely store a copy of your signed Agreement. Violations to the Agreement will result in no further prescriptions. (Additional copies of our  Medication Agreement are available upon request.) ?Laws, Rules, & Regulations: All patients are expected to follow all Federal and State Laws, Statutes, Rules, & Regulations. Ignorance of   the Laws does not constitute a valid excuse.  ?Illegal drugs and Controlled Substances: The use of illegal substances (including, but not limited to marijuana and its derivatives) and/or the illegal use of any controlled substances is strictly prohibited. Violation of this rule may result in the immediate and permanent discontinuation of any and all prescriptions being written by our practice. The use of any illegal substances is prohibited. ?Adopted CDC guidelines & recommendations: Target dosing levels will be at or below 60 MME/day. Use of benzodiazepines** is not recommended. ? ?Exceptions: There are only two exceptions to the rule of not receiving pain medications from other Healthcare Providers. ?Exception #1 (Emergencies): In the event of an emergency (i.e.: accident requiring emergency care), you are allowed to receive additional pain medication. However, you are responsible for: As soon as you are able, call our office [phone], at any time of the day or night, and leave a message stating your name, the date and nature of the emergency, and the name and dose of the medication prescribed. In the event that your call is answered by a member of our staff, make sure to document and save the date, time, and the name of the person that took your information.  ?Exception #2 (Planned Surgery): In the event that you are scheduled by another doctor or dentist to have any type of surgery or procedure, you are allowed (for a period no longer than 30 days), to receive additional pain medication, for the acute post-op pain. However, in this case, you are responsible for picking up a copy of our "Post-op Pain Management for Surgeons" handout, and giving it to your surgeon or dentist. This document is available at our office, and  does not require an appointment to obtain it. Simply go to our office during business hours (Monday-Thursday from 8:00 AM to 4:00 PM) (Friday 8:00 AM to 12:00 Noon) or if you have a scheduled appointment with us, prior to your surgery, and ask for it by name. In addition, you are responsible for: calling our office [phone], at any time of the day or night, and leaving a message stating your name, name of your surgeon, type of surgery, and date of procedure or surgery. Failure to comply with your responsibilities may result in termination of therapy involving the controlled substances. ?Medication Agreement Violation. Following the above rules, including your responsibilities will help you in avoiding a Medication Agreement Violation (?Breaking your Pain Medication Contract?). ? ?*Opioid medications include: morphine, codeine, oxycodone, oxymorphone, hydrocodone, hydromorphone, meperidine, tramadol, tapentadol, buprenorphine, fentanyl, methadone. ?**Benzodiazepine medications include: diazepam (Valium), alprazolam (Xanax), clonazepam (Klonopine), lorazepam (Ativan), clorazepate (Tranxene), chlordiazepoxide (Librium), estazolam (Prosom), oxazepam (Serax), temazepam (Restoril), triazolam (Halcion) ?(Last updated: 04/15/2021) ?____________________________________________________________________________________________ ? ____________________________________________________________________________________________ ? ?Medication Recommendations and Reminders ? ?Applies to: All patients receiving prescriptions (written and/or electronic). ? ?Medication Rules & Regulations: These rules and regulations exist for your safety and that of others. They are not flexible and neither are we. Dismissing or ignoring them will be considered "non-compliance" with medication therapy, resulting in complete and irreversible termination of such therapy. (See document titled "Medication Rules" for more details.) In all conscience,  because of safety reasons, we cannot continue providing a therapy where the patient does not follow instructions. ? ?Pharmacy of record:  ?Definition: This is the pharmacy where your electronic prescriptions w

## 2021-10-26 NOTE — Progress Notes (Deleted)
[id number] ?

## 2021-10-26 NOTE — Progress Notes (Signed)
Nursing Pain Medication Assessment:  ?Safety precautions to be maintained throughout the outpatient stay will include: orient to surroundings, keep bed in low position, maintain call bell within reach at all times, provide assistance with transfer out of bed and ambulation.  ?Medication Inspection Compliance: Pill count conducted under aseptic conditions, in front of the patient. Neither the pills nor the bottle was removed from the patient's sight at any time. Once count was completed pills were immediately returned to the patient in their original bottle. ? ?Medication: Oxycodone IR ?Pill/Patch Count:  31 of 120 pills remain ?Pill/Patch Appearance: Markings consistent with prescribed medication ?Bottle Appearance: Standard pharmacy container. Clearly labeled. ?Filled Date: 3 / 22 / 2023 ?Last Medication intake:  TodaySafety precautions to be maintained throughout the outpatient stay will include: orient to surroundings, keep bed in low position, maintain call bell within reach at all times, provide assistance with transfer out of bed and ambulation.  ?

## 2021-10-26 NOTE — Progress Notes (Signed)
PROVIDER NOTE: Information contained herein reflects review and annotations entered in association with encounter. Interpretation of such information and data should be left to medically-trained personnel. Information provided to patient can be located elsewhere in the medical record under "Patient Instructions". Document created using STT-dictation technology, any transcriptional errors that may result from process are unintentional.  ?  ?Patient: Nathan Lambert.  Service Category: E/M  Provider: Oswaldo Done, MD  ?DOB: 31-May-1981  DOS: 10/26/2021  Specialty: Interventional Pain Management  ?MRN: [id number]  Setting: Ambulatory outpatient  PCP: Smith Robert, MD  ?Type: Established Patient    Referring Provider: Smith Robert, MD  ?Location: Office  Delivery: Face-to-face    ? ?HPI  ?[name], a 41 y.o. year old male, is here today because of his Chronic pain syndrome [G89.4]. Mr. Bartleson primary complain today is Back Pain (lower) ?Last encounter: My last encounter with him was on 08/03/2021. ?Pertinent problems: Mr. 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); Chronic lower extremity pain (3ry area of Pain) (Bilateral) (L>R); Lumbar facet hypertrophy (Bilateral); Lumbar facet syndrome (Bilateral) (R>L); Neurogenic pain; Spondylosis without myelopathy or radiculopathy, lumbar region; Other specified dorsopathies, sacral and sacrococcygeal region; Lumbar spondylosis; Chronic upper extremity pain (Left); Pain and numbness of left upper extremity; Cervical radiculitis (C6/C7)  (Left); DDD (degenerative disc disease), cervical; Cervicalgia; Osteoarthritis involving multiple joints; Chronic hip pain (4th area of Pain) (Bilateral) (L>R); Greater trochanteric bursitis (Left); Lumbar back pain with radiculopathy affecting left lower extremity (L5 dermatomal distribution); Subacute lumbar radiculopathy (L5) (Left); Chronic low back pain (1ry area of Pain) (Bilateral) w/o sciatica; Decreased range of motion of both hips; Chronic hip pain (Left); Enthesopathy of hip region (Left); Other bursitis of hip (Left); and Chronic groin pain (Left) on their pertinent problem list. ?Pain Assessment: Severity of Chronic pain is reported as a 3 /10. Location: Back Right/Denies. Onset: More than a month ago. Quality: Aching, Burning, Constant, Throbbing, Stabbing. Timing: Constant. Modifying factor(s): meds, heating pad. ?Vitals:  height is 5\' 11"  (1.803 m) and weight is 270 lb (122.5 kg). His temperature is 97.4 ?F (36.3 ?C) (abnormal). His blood pressure is 124/86 and his pulse is 83. His oxygen saturation is 95%.  ? ?Reason for encounter: medication management.   The patient indicates doing well with the current medication regimen. No adverse reactions or major effects reported to the medications.  He does report having a little bit of itching and some constipation, but he refers that it is nothing to be concerned about and he takes some MiraLAX for it. ? 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  I have explained to him the mechanism of this and I have also explained to him that the only way to decrease this type of a problem would be to decrease the pain medication that he is taking or perhaps completely stop it.  He did not seem to consider that to be an option at this time, however he refers having a better understanding of  some of  the other problems he has been having. ? ?RTCB: 01/31/2022 ?Nonopioids transferred 05/07/2020: Magnesium, vitamin D3, and calcium ? ?Pharmacotherapy Assessment  ?Analgesic: Oxycodone IR 10 mg 4 times daily. ?MME/day: 60 mg/day.  ? ?Monitoring: ?[place] PMP: PDMP reviewed during this encounter.       ?Pharmacotherapy: No side-effects or adverse reactions reported. ?Compliance: No problems identified. ?Effectiveness: Clinically acceptable. ? ?Brigitte Pulse, RN  10/26/2021 11:52 AM  Sign when Signing Visit ?Nursing Pain Medication Assessment:  ?Safety precautions to be maintained throughout the outpatient stay will include: orient to surroundings, keep bed in low position, maintain call bell within reach at all times, provide assistance with transfer out of bed and ambulation.  ?Medication Inspection Compliance: Pill count conducted under aseptic conditions, in front of the patient. Neither the pills nor the bottle was removed from the patient's sight at any time. Once count was completed pills were immediately returned to the patient in their original bottle. ? ?Medication: Oxycodone IR ?Pill/Patch Count:  31 of 120 pills remain ?Pill/Patch Appearance: Markings consistent with prescribed medication ?Bottle Appearance: Standard pharmacy container. Clearly labeled. ?Filled Date: 3 / 81 / 2023 ?Last Medication intake:  TodaySafety precautions to be maintained throughout the outpatient stay will include: orient to surroundings, keep bed in low position, maintain call bell within reach at all times, provide assistance with transfer out of bed and ambulation.  ?   UDS:  ?Summary  ?Date Value Ref Range Status  ?01/05/2021 Note  Final  ?  Comment:  ?  ==================================================================== ?[facility] (MW) ?==================================================================== ?Test                             Result       Flag       Units ? ?Drug Present and Declared for Prescription  Verification ?  Oxycodone                      1450         EXPECTED   ng/mg creat ?  Oxymorphone                    2277         EXPECTED   ng/mg creat ?  Noroxycodone                   1814         EXPECTED   ng/mg creat ?  Noroxymorphone                 577          EXPECTED   ng/mg creat ?   Sources of oxycodone are scheduled prescription medications. ?   Oxymorphone, noroxycodone, and noroxymorphone are expected ?   metabolites of oxycodone. Oxymorphone is also available as a ?   scheduled prescription medication. ? ?==================================================================== ?Test                      Result    Flag   Units      Ref Range ?  Creatinine              133              mg/dL      >=32 ?==================================================================== ?Declared Medications: ? The flagging and interpretation on this report are based on the ? following declared medications.  Unexpected results may arise  from ? inaccuracies in the declared medications. ? ? **Note: The testing scope of this panel includes these medications: ? ? Oxycodone ? ? **Note: The testing scope of this panel does not include the ? following reported medications: ? ? Azelastine (Astelin) ? Calcium ? Clobetasol ? Doxycycline (Doryx) ? Econazole ? Famotidine (Pepcid) ? Fluticasone (Flonase) ? Gentamicin ? Loratadine (Claritin) ? Magnesium ? Metformin (Glucophage) ? Montelukast (Singulair) ? Naloxone (Narcan) ? Terbinafine (Lamisil) ? Testosterone ? Vitamin D ? Vitamin D3 ?==================================================================== ?For clinical consultation, please call [phone]. ?==================================================================== ?  ?  ? ?ROS  ?Constitutional: Denies any fever or chills ?Gastrointestinal: No reported hemesis, hematochezia, vomiting, or acute GI distress ?Musculoskeletal: Denies any acute onset joint swelling, redness, loss of ROM, or weakness ?Neurological: No reported  episodes of acute onset apraxia, aphasia, dysarthria, agnosia, amnesia, paralysis, loss of coordination, or loss of consciousness ? ?Medication Review  ?Magnesium, Magnesium Oxide, Oxycodone HCl, azel

## 2022-01-24 NOTE — Progress Notes (Unsigned)
PROVIDER NOTE: Information contained herein reflects review and annotations entered in association with encounter. Interpretation of such information and data should be left to medically-trained personnel. Information provided to patient can be located elsewhere in the medical record under "Patient Instructions". Document created using STT-dictation technology, any transcriptional errors that may result from process are unintentional.    Patient: Nathan L Hardacre Jr.  Service Category: E/M  Provider: Francisco A Naveira, MD  DOB: 01/31/1981  DOS: 01/27/2022  Specialty: Interventional Pain Management  MRN: [id number]  Setting: Ambulatory outpatient  PCP: Lambert, Stephen, MD  Type: Established Patient    Referring Provider: Kikel, Stephen, MD  Location: Office  Delivery: Face-to-face     HPI  Mr. Nathan L Graffius Jr., a 41 y.o. year old male, is here today because of his No primary diagnosis found.. Nathan Lambert's primary complain today is No chief complaint on file. Last encounter: My last encounter with him was on 10/26/2021. Pertinent problems: 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); Chronic lower extremity pain (3ry area of Pain) (Bilateral) (L>R); Lumbar facet hypertrophy (Bilateral); Lumbar facet syndrome (Bilateral) (R>L); Neurogenic pain; Spondylosis without myelopathy or radiculopathy, lumbar region; Other specified dorsopathies, sacral and sacrococcygeal region; Lumbar spondylosis; Chronic upper extremity pain (Left); Pain and numbness of left upper extremity; Cervical radiculitis  (C6/C7) (Left); DDD (degenerative disc disease), cervical; Cervicalgia; Osteoarthritis involving multiple joints; Chronic hip pain (4th area of Pain) (Bilateral) (L>R); Greater trochanteric bursitis (Left); Lumbar back pain with radiculopathy affecting left lower extremity (L5 dermatomal distribution); Subacute lumbar radiculopathy (L5) (Left); Chronic low back pain (1ry area of Pain) (Bilateral) w/o sciatica; Decreased range of motion of both hips; Chronic hip pain (Left); Enthesopathy of hip region (Left); Other bursitis of hip (Left); and Chronic groin pain (Left) on their pertinent problem list. Pain Assessment: Severity of   is reported as a  /10. Location:    / . Onset:  . Quality:  . Timing:  . Modifying factor(s):  . Vitals:  vitals were not taken for this visit.   Reason for encounter:  *** . ***  Pharmacotherapy Assessment  Analgesic: Oxycodone IR 10 mg 4 times daily. MME/day: 60 mg/day.   Monitoring: [place] PMP: PDMP reviewed during this encounter.       Pharmacotherapy: No side-effects or adverse reactions reported. Compliance: No problems identified. Effectiveness: Clinically acceptable.  No notes on file  UDS:  Summary  Date Value Ref Range Status  01/05/2021 Note  Final    Comment:    ==================================================================== [facility] (MW) ==================================================================== Test                             Result       Flag       Units  Drug Present and Declared for Prescription Verification   Oxycodone                      1450         EXPECTED   ng/mg creat   Oxymorphone                    2277           EXPECTED   ng/mg creat   Noroxycodone                   1814         EXPECTED   ng/mg creat   Noroxymorphone                 577          EXPECTED   ng/mg creat    Sources of oxycodone are scheduled prescription medications.    Oxymorphone, noroxycodone, and noroxymorphone are expected    metabolites  of oxycodone. Oxymorphone is also available as a    scheduled prescription medication.  ==================================================================== Test                      Result    Flag   Units      Ref Range   Creatinine              133              mg/dL      >=20 ==================================================================== Declared Medications:  The flagging and interpretation on this report are based on the  following declared medications.  Unexpected results may arise from  inaccuracies in the declared medications.   **Note: The testing scope of this panel includes these medications:   Oxycodone   **Note: The testing scope of this panel does not include the  following reported medications:   Azelastine (Astelin)  Calcium  Clobetasol  Doxycycline (Doryx)  Econazole  Famotidine (Pepcid)  Fluticasone (Flonase)  Gentamicin  Loratadine (Claritin)  Magnesium  Metformin (Glucophage)  Montelukast (Singulair)  Naloxone (Narcan)  Terbinafine (Lamisil)  Testosterone  Vitamin D  Vitamin D3 ==================================================================== For clinical consultation, please call [phone]. ====================================================================      ROS  Constitutional: Denies any fever or chills Gastrointestinal: No reported hemesis, hematochezia, vomiting, or acute GI distress Musculoskeletal: Denies any acute onset joint swelling, redness, loss of ROM, or weakness Neurological: No reported episodes of acute onset apraxia, aphasia, dysarthria, agnosia, amnesia, paralysis, loss of coordination, or loss of consciousness  Medication Review  Magnesium, Magnesium Oxide -Mg Supplement, Oxycodone HCl, azelastine, clobetasol ointment, econazole nitrate, famotidine, fluticasone, gentamicin cream, glipiZIDE, loratadine, montelukast, naloxone, terbinafine, and testosterone cypionate  History Review  Allergy: Nathan Lambert  is allergic to bactrim [sulfamethoxazole-trimethoprim], pollen extract, and sulfa antibiotics. Drug: Nathan Lambert  reports no history of drug use. Alcohol:  reports no history of alcohol use. Tobacco:  reports that he has been smoking cigarettes. He has a 10.00 pack-year smoking history. He has never used smokeless tobacco. Social: Nathan Lambert  reports that he has been smoking cigarettes. He has a 10.00 pack-year smoking history. He has never used smokeless tobacco. He reports that he does not drink alcohol and does not use drugs. Medical:  has a past medical history of Allergy (2012), Chronic low back pain (Primary Area of Pain) (Bilateral) (R>L), Degeneration of lumbar or lumbosacral intervertebral disc (L4-L5) (12/27/2016), Gastroesophageal reflux disease without esophagitis (10/26/2021), Headache, Mixed hyperlipidemia (10/26/2021), and Opiate use (02/15/2017). Surgical: Nathan Lambert  has a past surgical history that includes Fracture surgery (Left); Spine surgery (Right, 06/16/2015); and Back surgery. Family: family history includes AAA (abdominal aortic aneurysm) in his mother; Diabetes in his father; Stroke in his mother.  Laboratory Chemistry Profile   Renal Lab Results  Component Value Date   BUN 10 06/07/2019   CREATININE 0.76 06/07/2019   BCR 7 (L) 12/27/2016   GFRAA >  60 06/07/2019   GFRNONAA >60 06/07/2019    Hepatic Lab Results  Component Value Date   AST 14 (L) 06/07/2019   ALT 17 06/07/2019   ALBUMIN 4.0 06/07/2019   ALKPHOS 50 06/07/2019    Electrolytes Lab Results  Component Value Date   NA 138 06/07/2019   K 4.3 06/07/2019   CL 104 06/07/2019   CALCIUM 9.0 06/07/2019   MG 2.0 06/07/2019    Bone Lab Results  Component Value Date   VD25OH 15.51 (L) 06/07/2019   25OHVITD1 17 (L) 12/27/2016   25OHVITD2 <1.0 12/27/2016   25OHVITD3 17 12/27/2016   TESTOFREE 2.6 (L) 06/08/2017   TESTOSTERONE 116 (L) 06/08/2017    Inflammation (CRP: Acute Phase) (ESR:  Chronic Phase) Lab Results  Component Value Date   CRP 2.7 (H) 06/07/2019   ESRSEDRATE 17 (H) 06/07/2019         Note: Above Lab results reviewed.  Recent Imaging Review  DG PAIN CLINIC C-ARM 1-60 MIN NO REPORT Fluoro was used, but no Radiologist interpretation will be provided.  Please refer to "NOTES" tab for provider progress note. Note: Reviewed        Physical Exam  General appearance: Well nourished, well developed, and well hydrated. In no apparent acute distress Mental status: Alert, oriented x 3 (person, place, & time)       Respiratory: No evidence of acute respiratory distress Eyes: PERLA Vitals: There were no vitals taken for this visit. BMI: Estimated body mass index is 37.66 kg/m as calculated from the following:   Height as of 10/26/21: 5' 11" (1.803 m).   Weight as of 10/26/21: 270 lb (122.5 kg). Ideal: Patient weight not recorded  Assessment   Diagnosis Status  No diagnosis found. Controlled Controlled Controlled   Updated Problems: No problems updated.  Plan of Care  Problem-specific:  No problem-specific Assessment & Plan notes found for this encounter.  Mr. Primo Innis. has a current medication list which includes the following long-term medication(s): azelastine, famotidine, fluticasone, glipizide, loratadine, magnesium, magnesium oxide -mg supplement, montelukast, naloxone, oxycodone hcl, oxycodone hcl, and oxycodone hcl.  Pharmacotherapy (Medications Ordered): No orders of the defined types were placed in this encounter.  Orders:  No orders of the defined types were placed in this encounter.  Follow-up plan:   No follow-ups on file.     Interventional Therapies  Risk  Complexity Considerations:   Estimated body mass index is 36.26 kg/m as calculated from the following:   Height as of this encounter: 5' 11" (1.803 m).   Weight as of this encounter: 260 lb (117.9 kg). WNL   Planned  Pending:      Under consideration:    Possible left SI joint RFA Diagnostic caudal ESI + diagnostic epidurogram  Possible Racz procedure  Diagnostic left IA knee injection (w/ steroid)  Possible left IA Hyalgan knee injections  Diagnostic left Genicular NB  Possible left Genicular nerve RFA    Completed:   Diagnostic/therapeutic left IA hip joint + bursa injection x1 (11/25/2020) (100/100/0/0)  Therapeutic right lumbar facet MBB x3 (05/15/2020) (100/100/50/50)  Therapeutic left lumbar facet MBB x3 (05/15/2020) 100/100/50/50)  Therapeutic right lumbar facet RFA x1 (10/11/2017) NR Therapeutic left lumbar facet RFA x1 (11/24/2017) NR Therapeutic right SI joint block x1 (05/19/2017)  Therapeutic left SI joint block x1 (05/19/2017)  Therapeutic right SI joint RFA x1 (10/11/2017)  Therapeutic right L4 TFES2 x1 (05/30/2018)    Therapeutic  Palliative (PRN) options:   Palliative bilateral lumbar  facet block #4 Palliative bilateral SI joint block #2  Diagnostic right L4 TFES2 #2      Recent Visits Date Type Provider Dept  10/26/21 Office Visit Lambert, Francisco, MD [facility]  Showing recent visits within past 90 days and meeting all other requirements Future Appointments Date Type Provider Dept  01/27/22 Appointment Lambert, Francisco, MD [facility]  Showing future appointments within next 90 days and meeting all other requirements  I discussed the assessment and treatment plan with the patient. The patient was provided an opportunity to ask questions and all were answered. The patient agreed with the plan and demonstrated an understanding of the instructions.  Patient advised to call back or seek an in-person evaluation if the symptoms or condition worsens.  Duration of encounter: *** minutes.  Total time on encounter, as per AMA guidelines included both the face-to-face and non-face-to-face time personally spent by the physician and/or other qualified health care professional(s) on the day of the  encounter (includes time in activities that require the physician or other qualified health care professional and does not include time in activities normally performed by clinical staff). Physician's time may include the following activities when performed: preparing to see the patient (eg, review of tests, pre-charting review of records) obtaining and/or reviewing separately obtained history performing a medically appropriate examination and/or evaluation counseling and educating the patient/family/caregiver ordering medications, tests, or procedures referring and communicating with other health care professionals (when not separately reported) documenting clinical information in the electronic or other health record independently interpreting results (not separately reported) and communicating results to the patient/ family/caregiver care coordination (not separately reported)  Note by: Nathan A Naveira, MD Date: 01/27/2022; Time: 3:57 PM 

## 2022-01-27 ENCOUNTER — Ambulatory Visit: Payer: 59 | Attending: Pain Medicine | Admitting: Pain Medicine

## 2022-01-27 ENCOUNTER — Encounter: Payer: Self-pay | Admitting: Pain Medicine

## 2022-01-27 VITALS — BP 135/90 | HR 73 | Temp 97.2°F | Resp 16 | Ht 71.0 in | Wt 273.0 lb

## 2022-01-27 DIAGNOSIS — M961 Postlaminectomy syndrome, not elsewhere classified: Secondary | ICD-10-CM | POA: Diagnosis present

## 2022-01-27 DIAGNOSIS — M79605 Pain in left leg: Secondary | ICD-10-CM

## 2022-01-27 DIAGNOSIS — M545 Low back pain, unspecified: Secondary | ICD-10-CM | POA: Diagnosis present

## 2022-01-27 DIAGNOSIS — G894 Chronic pain syndrome: Secondary | ICD-10-CM

## 2022-01-27 DIAGNOSIS — Z79899 Other long term (current) drug therapy: Secondary | ICD-10-CM

## 2022-01-27 DIAGNOSIS — Z79891 Long term (current) use of opiate analgesic: Secondary | ICD-10-CM

## 2022-01-27 DIAGNOSIS — M25552 Pain in left hip: Secondary | ICD-10-CM | POA: Diagnosis present

## 2022-01-27 DIAGNOSIS — G8929 Other chronic pain: Secondary | ICD-10-CM

## 2022-01-27 DIAGNOSIS — M25562 Pain in left knee: Secondary | ICD-10-CM | POA: Diagnosis present

## 2022-01-27 DIAGNOSIS — M25551 Pain in right hip: Secondary | ICD-10-CM | POA: Diagnosis present

## 2022-01-27 DIAGNOSIS — M79604 Pain in right leg: Secondary | ICD-10-CM

## 2022-01-27 MED ORDER — OXYCODONE HCL 10 MG PO TABS: 10.0000 mg | ORAL_TABLET | Freq: Four times a day (QID) | ORAL | 0 refills | Status: DC | PRN

## 2022-01-27 NOTE — Progress Notes (Signed)
Nursing Pain Medication Assessment:  Safety precautions to be maintained throughout the outpatient stay will include: orient to surroundings, keep bed in low position, maintain call bell within reach at all times, provide assistance with transfer out of bed and ambulation.  Medication Inspection Compliance: Pill count conducted under aseptic conditions, in front of the patient. Neither the pills nor the bottle was removed from the patient's sight at any time. Once count was completed pills were immediately returned to the patient in their original bottle.  Medication: Oxycodone IR Pill/Patch Count: 15/120 Pill/Patch Appearance: Markings consistent with prescribed medication Bottle Appearance: Standard pharmacy container. Clearly labeled. Filled Date: 06 / 16 / 2023 Last Medication intake:  Today

## 2022-01-27 NOTE — Patient Instructions (Signed)

## 2022-02-01 LAB — TOXASSURE SELECT 13 (MW), URINE

## 2022-04-25 NOTE — Progress Notes (Unsigned)
PROVIDER NOTE: Information contained herein reflects review and annotations entered in association with encounter. Interpretation of such information and data should be left to medically-trained personnel. Information provided to patient can be located elsewhere in the medical record under "Patient Instructions". Document created using STT-dictation technology, any transcriptional errors that may result from process are unintentional.    Patient: Nathan Lambert.  Service Category: E/M  Provider: Gaspar Cola, MD  DOB: 1980/10/25  DOS: 04/28/2022  Referring Provider: Vidal Schwalbe, MD  MRN: [id number]  Specialty: Interventional Pain Management  PCP: Nathan Schwalbe, MD  Type: Established Patient  Setting: Ambulatory outpatient    Location: Office  Delivery: Face-to-face     HPI  [name], a 41 y.o. year old male, is here today because of his No primary diagnosis found.. Nathan Lambert primary complain today is No chief complaint on file. Last encounter: My last encounter with him was on 01/27/2022. 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); Chronic lower extremity pain (3ry area of Pain) (Bilateral) (L>R); Lumbar facet hypertrophy (Bilateral); Lumbar facet syndrome (Bilateral) (R>L); Neurogenic pain; Spondylosis without myelopathy or radiculopathy, lumbar region; Other specified dorsopathies, sacral and sacrococcygeal region; Lumbar spondylosis; Chronic upper extremity pain (Left); Pain and numbness of left upper extremity; Cervical  radiculitis (C6/C7) (Left); DDD (degenerative disc disease), cervical; Cervicalgia; Osteoarthritis involving multiple joints; Chronic hip pain (4th area of Pain) (Bilateral) (L>R); Greater trochanteric bursitis (Left); Lumbar back pain with radiculopathy affecting left lower extremity (L5 dermatomal distribution); Subacute lumbar radiculopathy (L5) (Left); Chronic low back pain (1ry area of Pain) (Bilateral) w/o sciatica; Decreased range of motion of both hips; Chronic hip pain (Left); Enthesopathy of hip region (Left); Other bursitis of hip (Left); and Chronic groin pain (Left) on their pertinent problem list. Pain Assessment: Severity of   is reported as a  /10. Location:    / . Onset:  . Quality:  . Timing:  . Modifying factor(s):  Marland Kitchen Vitals:  vitals were not taken for this visit.   Reason for encounter:  *** . ***  Pharmacotherapy Assessment  Analgesic: Oxycodone IR 10 mg 4 times daily. MME/day: 60 mg/day.   Monitoring: Nathan Lambert PMP: PDMP reviewed during this encounter.       Pharmacotherapy: No side-effects or adverse reactions reported. Compliance: No problems identified. Effectiveness: Clinically acceptable.  No notes on file  No results found for: "CBDTHCR" No results found for: "D8THCCBX" No results found for: "D9THCCBX"  UDS:  Summary  Date Value Ref Range Status  01/27/2022 Note  Final    Comment:    ==================================================================== [facility] (MW) ==================================================================== Test                             Result       Flag       Units  Drug Present and Declared for Prescription Verification   Oxycodone                      766          EXPECTED   ng/mg creat   Oxymorphone  1593         EXPECTED   ng/mg creat   Noroxycodone                   592          EXPECTED   ng/mg creat   Noroxymorphone                 304          EXPECTED   ng/mg creat    Sources of oxycodone are  scheduled prescription medications.    Oxymorphone, noroxycodone, and noroxymorphone are expected    metabolites of oxycodone. Oxymorphone is also available as a    scheduled prescription medication.  ==================================================================== Test                      Result    Flag   Units      Ref Range   Creatinine              178              mg/dL      >=20 ==================================================================== Declared Medications:  The flagging and interpretation on this report are based on the  following declared medications.  Unexpected results may arise from  inaccuracies in the declared medications.   **Note: The testing scope of this panel includes these medications:   Oxycodone   **Note: The testing scope of this panel does not include the  following reported medications:   Azelastine (Astelin)  Clobetasol (Temovate)  Econazole  Famotidine (Pepcid)  Fluticasone (Flonase)  Gentamicin  Glipizide (Glucotrol)  Loratadine (Claritin)  Magnesium  Montelukast (Singulair)  Naloxone (Narcan)  Terbinafine (Lamisil)  Testosterone ==================================================================== For clinical consultation, please call [phone]. ====================================================================       ROS  Constitutional: Denies any fever or chills Gastrointestinal: No reported hemesis, hematochezia, vomiting, or acute GI distress Musculoskeletal: Denies any acute onset joint swelling, redness, loss of ROM, or weakness Neurological: No reported episodes of acute onset apraxia, aphasia, dysarthria, agnosia, amnesia, paralysis, loss of coordination, or loss of consciousness  Medication Review  Magnesium, Magnesium Oxide -Mg Supplement, Oxycodone HCl, azelastine, clobetasol ointment, econazole nitrate, famotidine, fluticasone, gentamicin cream, glipiZIDE, loratadine, montelukast, naloxone, terbinafine, and  testosterone cypionate  History Review  Allergy: Nathan Lambert is allergic to bactrim [sulfamethoxazole-trimethoprim], pollen extract, and sulfa antibiotics. Drug: Nathan Lambert  reports no history of drug use. Alcohol:  reports no history of alcohol use. Tobacco:  reports that he has been smoking cigarettes. He has a 10.00 pack-year smoking history. He has never used smokeless tobacco. Social: Nathan Lambert  reports that he has been smoking cigarettes. He has a 10.00 pack-year smoking history. He has never used smokeless tobacco. He reports that he does not drink alcohol and does not use drugs. Medical:  has a past medical history of Allergy (2012), Chronic low back pain (Primary Area of Pain) (Bilateral) (R>L), Degeneration of lumbar or lumbosacral intervertebral disc (L4-L5) (12/27/2016), Gastroesophageal reflux disease without esophagitis (10/26/2021), Headache, Mixed hyperlipidemia (10/26/2021), and Opiate use (02/15/2017). Surgical: Nathan Lambert  has a past surgical history that includes Fracture surgery (Left); Spine surgery (Right, 06/16/2015); and Back surgery. Family: family history includes AAA (abdominal aortic aneurysm) in his mother; Diabetes in his father; Stroke in his mother.  Laboratory Chemistry Profile   Renal Lab Results  Component Value Date   BUN 10 06/07/2019   CREATININE 0.76 06/07/2019   BCR 7 (L) 12/27/2016  GFRAA >60 06/07/2019   GFRNONAA >60 06/07/2019    Hepatic Lab Results  Component Value Date   AST 14 (L) 06/07/2019   ALT 17 06/07/2019   ALBUMIN 4.0 06/07/2019   ALKPHOS 50 06/07/2019    Electrolytes Lab Results  Component Value Date   NA 138 06/07/2019   K 4.3 06/07/2019   CL 104 06/07/2019   CALCIUM 9.0 06/07/2019   MG 2.0 06/07/2019    Bone Lab Results  Component Value Date   VD25OH 15.51 (L) 06/07/2019   25OHVITD1 17 (L) 12/27/2016   25OHVITD2 <1.0 12/27/2016   25OHVITD3 17 12/27/2016   TESTOFREE 2.6 (L) 06/08/2017   TESTOSTERONE  116 (L) 06/08/2017    Inflammation (CRP: Acute Phase) (ESR: Chronic Phase) Lab Results  Component Value Date   CRP 2.7 (H) 06/07/2019   ESRSEDRATE 17 (H) 06/07/2019         Note: Above Lab results reviewed.  Recent Imaging Review  DG PAIN CLINIC C-ARM 1-60 MIN NO REPORT Fluoro was used, but no Radiologist interpretation will be provided.  Please refer to "NOTES" tab for provider progress note. Note: Reviewed        Physical Exam  General appearance: Well nourished, well developed, and well hydrated. In no apparent acute distress Mental status: Alert, oriented x 3 (person, place, & time)       Respiratory: No evidence of acute respiratory distress Eyes: PERLA Vitals: There were no vitals taken for this visit. BMI: Estimated body mass index is 38.08 kg/m as calculated from the following:   Height as of 01/27/22: _0  (1.803 m).   Weight as of 01/27/22: 273 lb (123.8 kg). Ideal: Patient weight not recorded  Assessment   Diagnosis Status  No diagnosis found. Controlled Controlled Controlled   Updated Problems: No problems updated.   Plan of Care  Problem-specific:  No problem-specific Assessment & Plan notes found for this encounter.  Nathan Lambert. has a current medication list which includes the following long-term medication(s): azelastine, famotidine, fluticasone, glipizide, loratadine, magnesium, magnesium oxide -mg supplement, montelukast, naloxone, oxycodone hcl, oxycodone hcl, and oxycodone hcl.  Pharmacotherapy (Medications Ordered): No orders of the defined types were placed in this encounter.  Orders:  No orders of the defined types were placed in this encounter.  Follow-up plan:   No follow-ups on file.     Interventional Therapies  Risk  Complexity Considerations:   Estimated body mass index is 36.26 kg/m as calculated from the following:   Height as of this encounter: _1  (1.803 m).   Weight as of this encounter: 260 lb (117.9  kg). WNL   Planned  Pending:      Under consideration:   Possible left SI joint RFA Diagnostic caudal ESI + diagnostic epidurogram  Possible Racz procedure  Diagnostic left IA knee injection (w/ steroid)  Possible left IA Hyalgan knee injections  Diagnostic left Genicular NB  Possible left Genicular nerve RFA    Completed:   Diagnostic/therapeutic left IA hip joint + bursa injection x1 (11/25/2020) (100/100/0/0)  Therapeutic right lumbar facet MBB x3 (05/15/2020) (100/100/50/50)  Therapeutic left lumbar facet MBB x3 (05/15/2020) 100/100/50/50)  Therapeutic right lumbar facet RFA x1 (10/11/2017) NR Therapeutic left lumbar facet RFA x1 (11/24/2017) NR Therapeutic right SI joint block x1 (05/19/2017)  Therapeutic left SI joint block x1 (05/19/2017)  Therapeutic right SI joint RFA x1 (10/11/2017)  Therapeutic right L4 TFES2 x1 (05/30/2018)    Therapeutic  Palliative (PRN) options:   Palliative  bilateral lumbar facet block #4 Palliative bilateral SI joint block #2  Diagnostic right L4 TFES2 #2       Recent Visits Date Type Provider Dept  01/27/22 Office Visit Milinda Pointer, MD [facility]  Showing recent visits within past 90 days and meeting all other requirements Future Appointments Date Type Provider Dept  04/28/22 Appointment Milinda Pointer, MD [facility]  Showing future appointments within next 90 days and meeting all other requirements  I discussed the assessment and treatment plan with the patient. The patient was provided an opportunity to ask questions and all were answered. The patient agreed with the plan and demonstrated an understanding of the instructions.  Patient advised to call back or seek an in-person evaluation if the symptoms or condition worsens.  Duration of encounter: *** minutes.  Total time on encounter, as per AMA guidelines included both the face-to-face and non-face-to-face time personally spent by the physician and/or  other qualified health care professional(s) on the day of the encounter (includes time in activities that require the physician or other qualified health care professional and does not include time in activities normally performed by clinical staff). Physician's time may include the following activities when performed: preparing to see the patient (eg, review of tests, pre-charting review of records) obtaining and/or reviewing separately obtained history performing a medically appropriate examination and/or evaluation counseling and educating the patient/family/caregiver ordering medications, tests, or procedures referring and communicating with other health care professionals (when not separately reported) documenting clinical information in the electronic or other health record independently interpreting results (not separately reported) and communicating results to the patient/ family/caregiver care coordination (not separately reported)  Note by: Nathan Cola, MD Date: 04/28/2022; Time: 5:03 PM

## 2022-04-28 ENCOUNTER — Ambulatory Visit: Payer: Self-pay | Attending: Pain Medicine | Admitting: Pain Medicine

## 2022-04-28 ENCOUNTER — Encounter: Payer: Self-pay | Admitting: Pain Medicine

## 2022-04-28 VITALS — BP 127/82 | HR 69 | Temp 98.1°F | Ht 71.0 in | Wt 240.0 lb

## 2022-04-28 DIAGNOSIS — M25562 Pain in left knee: Secondary | ICD-10-CM | POA: Insufficient documentation

## 2022-04-28 DIAGNOSIS — M961 Postlaminectomy syndrome, not elsewhere classified: Secondary | ICD-10-CM | POA: Insufficient documentation

## 2022-04-28 DIAGNOSIS — M79605 Pain in left leg: Secondary | ICD-10-CM | POA: Insufficient documentation

## 2022-04-28 DIAGNOSIS — M25552 Pain in left hip: Secondary | ICD-10-CM | POA: Insufficient documentation

## 2022-04-28 DIAGNOSIS — G8929 Other chronic pain: Secondary | ICD-10-CM | POA: Insufficient documentation

## 2022-04-28 DIAGNOSIS — M545 Low back pain, unspecified: Secondary | ICD-10-CM | POA: Insufficient documentation

## 2022-04-28 DIAGNOSIS — M25551 Pain in right hip: Secondary | ICD-10-CM | POA: Insufficient documentation

## 2022-04-28 DIAGNOSIS — G894 Chronic pain syndrome: Secondary | ICD-10-CM | POA: Insufficient documentation

## 2022-04-28 DIAGNOSIS — Z79899 Other long term (current) drug therapy: Secondary | ICD-10-CM | POA: Insufficient documentation

## 2022-04-28 DIAGNOSIS — Z79891 Long term (current) use of opiate analgesic: Secondary | ICD-10-CM | POA: Insufficient documentation

## 2022-04-28 DIAGNOSIS — M79604 Pain in right leg: Secondary | ICD-10-CM | POA: Insufficient documentation

## 2022-04-28 MED ORDER — OXYCODONE HCL 10 MG PO TABS: 10.0000 mg | ORAL_TABLET | Freq: Four times a day (QID) | ORAL | 0 refills | Status: DC | PRN

## 2022-04-28 MED ORDER — NALOXONE HCL 4 MG/0.1ML NA LIQD: 1.0000 | NASAL | 0 refills | Status: DC | PRN

## 2022-04-28 NOTE — Progress Notes (Signed)
Nursing Pain Medication Assessment:  Safety precautions to be maintained throughout the outpatient stay will include: orient to surroundings, keep bed in low position, maintain call bell within reach at all times, provide assistance with transfer out of bed and ambulation.  Medication Inspection Compliance: Pill count conducted under aseptic conditions, in front of the patient. Neither the pills nor the bottle was removed from the patient's sight at any time. Once count was completed pills were immediately returned to the patient in their original bottle.  Medication: Oxycodone IR Pill/Patch Count:  11 of 120 pills remain Pill/Patch Appearance: Markings consistent with prescribed medication Bottle Appearance: Standard pharmacy container. Clearly labeled. Filled Date: 51 / 14 / 2023 Last Medication intake:  TodaySafety precautions to be maintained throughout the outpatient stay will include: orient to surroundings, keep bed in low position, maintain call bell within reach at all times, provide assistance with transfer out of bed and ambulation.

## 2022-04-28 NOTE — Patient Instructions (Signed)
Naloxone Nasal Spray What is this medication? NALOXONE (nal OX one) treats opioid overdose, which causes slow or shallow breathing, severe drowsiness, or trouble staying awake. Call emergency services after using this medication. You may need additional treatment. Naloxone works by reversing the effects of opioids. It belongs to a group of medications called opioid blockers. This medicine may be used for other purposes; ask your health care provider or pharmacist if you have questions. COMMON BRAND NAME(S): Kloxxado, Narcan What should I tell my care team before I take this medication? They need to know if you have any of these conditions: Heart disease Substance use disorder An unusual or allergic reaction to naloxone, other medications, foods, dyes, or preservatives Pregnant or trying to get pregnant Breast-feeding How should I use this medication? This medication is for use in the nose. Lay the person on their back. Support their neck with your hand and allow the head to tilt back before giving the medication. The nasal spray should be given into 1 nostril. After giving the medication, move the person onto their side. Do not remove or test the nasal spray until ready to use. Get emergency medical help right away after giving the first dose of this medication, even if the person wakes up. You should be familiar with how to recognize the signs and symptoms of a narcotic overdose. If more doses are needed, give the additional dose in the other nostril. Talk to your care team about the use of this medication in children. While this medication may be prescribed for children as young as newborns for selected conditions, precautions do apply. Overdosage: If you think you have taken too much of this medicine contact a poison control center or emergency room at once. NOTE: This medicine is only for you. Do not share this medicine with others. What if I miss a dose? This does not apply. What may  interact with this medication? This is only used during an emergency. No interactions are expected during emergency use. This list may not describe all possible interactions. Give your health care provider a list of all the medicines, herbs, non-prescription drugs, or dietary supplements you use. Also tell them if you smoke, drink alcohol, or use illegal drugs. Some items may interact with your medicine. What should I watch for while using this medication? Keep this medication ready for use in the case of an opioid overdose. Make sure that you have the phone number of your care team and local hospital ready. You may need to have additional doses of this medication. Each nasal spray contains a single dose. Some emergencies may require additional doses. After use, bring the treated person to the nearest hospital or call 911. Make sure the treating care team knows that the person has received a dose of this medication. You will receive additional instructions on what to do during and after use of this medication before an emergency occurs. What side effects may I notice from receiving this medication? Side effects that you should report to your care team as soon as possible: Allergic reactions--skin rash, itching, hives, swelling of the face, lips, tongue, or throat Side effects that usually do not require medical attention (report these to your care team if they continue or are bothersome): Constipation Dryness or irritation inside the nose Headache Increase in blood pressure Muscle spasms Stuffy nose Toothache This list may not describe all possible side effects. Call your doctor for medical advice about side effects. You may report side effects to FDA   at 1-800-FDA-1088. Where should I keep my medication? Keep out of the reach of children and pets. Store between 20 and 25 degrees C (68 and 77 degrees F). Do not freeze. Throw away any unused medication after the expiration date. Keep in original  box until ready to use. NOTE: This sheet is a summary. It may not cover all possible information. If you have questions about this medicine, talk to your doctor, pharmacist, or health care provider.  2023 Elsevier/Gold Standard (2021-06-01 00:00:00) ____________________________________________________________________________________________  Medication Rules  Purpose: To inform patients, and their family members, of our rules and regulations.  Applies to: All patients receiving prescriptions (written or electronic).  Pharmacy of record: Pharmacy where electronic prescriptions will be sent. If written prescriptions are taken to a different pharmacy, please inform the nursing staff. The pharmacy listed in the electronic medical record should be the one where you would like electronic prescriptions to be sent.  Electronic prescriptions: In compliance with the [place] Strengthen Opioid Misuse Prevention (STOP) Act of 2017 (Session Law 2017-74/H243), effective July 19, 2018, all controlled substances must be electronically prescribed. Calling prescriptions to the pharmacy will cease to exist.  Prescription refills: Only during scheduled appointments. Applies to all prescriptions.  NOTE: The following applies primarily to controlled substances (Opioid* Pain Medications).   Type of encounter (visit): For patients receiving controlled substances, face-to-face visits are required. (Not an option or up to the patient.)  Patient's responsibilities: Pain Pills: Bring all pain pills to every appointment (except for procedure appointments). Pill Bottles: Bring pills in original pharmacy bottle. Always bring the newest bottle. Bring bottle, even if empty. Medication refills: You are responsible for knowing and keeping track of what medications you take and those you need refilled. The day before your appointment: write a list of all prescriptions that need to be refilled. The day of the  appointment: give the list to the admitting nurse. Prescriptions will be written only during appointments. No prescriptions will be written on procedure days. If you forget a medication: it will not be "Called in", "Faxed", or "electronically sent". You will need to get another appointment to get these prescribed. No early refills. Do not call asking to have your prescription filled early. Prescription Accuracy: You are responsible for carefully inspecting your prescriptions before leaving our office. Have the discharge nurse carefully go over each prescription with you, before taking them home. Make sure that your name is accurately spelled, that your address is correct. Check the name and dose of your medication to make sure it is accurate. Check the number of pills, and the written instructions to make sure they are clear and accurate. Make sure that you are given enough medication to last until your next medication refill appointment. Taking Medication: Take medication as prescribed. When it comes to controlled substances, taking less pills or less frequently than prescribed is permitted and encouraged. Never take more pills than instructed. Never take medication more frequently than prescribed.  Inform other Doctors: Always inform, all of your healthcare providers, of all the medications you take. Pain Medication from other Providers: You are not allowed to accept any additional pain medication from any other Doctor or Healthcare provider. There are two exceptions to this rule. (see below) In the event that you require additional pain medication, you are responsible for notifying us, as stated below. Cough Medicine: Often these contain an opioid, such as codeine or hydrocodone. Never accept or take cough medicine containing these opioids if you are already taking   an opioid* medication. The combination may cause respiratory failure and death. Medication Agreement: You are responsible for carefully  reading and following our Medication Agreement. This must be signed before receiving any prescriptions from our practice. Safely store a copy of your signed Agreement. Violations to the Agreement will result in no further prescriptions. (Additional copies of our Medication Agreement are available upon request.) Laws, Rules, & Regulations: All patients are expected to follow all Federal and State Laws, Statutes, Rules, & Regulations. Ignorance of the Laws does not constitute a valid excuse.  Illegal drugs and Controlled Substances: The use of illegal substances (including, but not limited to marijuana and its derivatives) and/or the illegal use of any controlled substances is strictly prohibited. Violation of this rule may result in the immediate and permanent discontinuation of any and all prescriptions being written by our practice. The use of any illegal substances is prohibited. Adopted CDC guidelines & recommendations: Target dosing levels will be at or below 60 MME/day. Use of benzodiazepines** is not recommended.  Exceptions: There are only two exceptions to the rule of not receiving pain medications from other Healthcare Providers. Exception #1 (Emergencies): In the event of an emergency (i.e.: accident requiring emergency care), you are allowed to receive additional pain medication. However, you are responsible for: As soon as you are able, call our office [phone], at any time of the day or night, and leave a message stating your name, the date and nature of the emergency, and the name and dose of the medication prescribed. In the event that your call is answered by a member of our staff, make sure to document and save the date, time, and the name of the person that took your information.  Exception #2 (Planned Surgery): In the event that you are scheduled by another doctor or dentist to have any type of surgery or procedure, you are allowed (for a period no longer than 30 days), to receive  additional pain medication, for the acute post-op pain. However, in this case, you are responsible for picking up a copy of our "Post-op Pain Management for Surgeons" handout, and giving it to your surgeon or dentist. This document is available at our office, and does not require an appointment to obtain it. Simply go to our office during business hours (Monday-Thursday from 8:00 AM to 4:00 PM) (Friday 8:00 AM to 12:00 Noon) or if you have a scheduled appointment with us, prior to your surgery, and ask for it by name. In addition, you are responsible for: calling our office [phone], at any time of the day or night, and leaving a message stating your name, name of your surgeon, type of surgery, and date of procedure or surgery. Failure to comply with your responsibilities may result in termination of therapy involving the controlled substances. Medication Agreement Violation. Following the above rules, including your responsibilities will help you in avoiding a Medication Agreement Violation ("Breaking your Pain Medication Contract").  *Opioid medications include: morphine, codeine, oxycodone, oxymorphone, hydrocodone, hydromorphone, meperidine, tramadol, tapentadol, buprenorphine, fentanyl, methadone. **Benzodiazepine medications include: diazepam (Valium), alprazolam (Xanax), clonazepam (Klonopine), lorazepam (Ativan), clorazepate (Tranxene), chlordiazepoxide (Librium), estazolam (Prosom), oxazepam (Serax), temazepam (Restoril), triazolam (Halcion) (Last updated: 04/15/2021) ____________________________________________________________________________________________  ____________________________________________________________________________________________  Medication Recommendations and Reminders  Applies to: All patients receiving prescriptions (written and/or electronic).  Medication Rules & Regulations: These rules and regulations exist for your safety and that of others. They are not  flexible and neither are we. Dismissing or ignoring them will be considered "non-compliance"   with medication therapy, resulting in complete and irreversible termination of such therapy. (See document titled "Medication Rules" for more details.) In all conscience, because of safety reasons, we cannot continue providing a therapy where the patient does not follow instructions.  Pharmacy of record:  Definition: This is the pharmacy where your electronic prescriptions will be sent.  We do not endorse any particular pharmacy, however, we have experienced problems with [facility] not securing enough medication supply for the community. We do not restrict you in your choice of pharmacy. However, once we write for your prescriptions, we will NOT be re-sending more prescriptions to fix restricted supply problems created by your pharmacy, or your insurance.  The pharmacy listed in the electronic medical record should be the one where you want electronic prescriptions to be sent. If you choose to change pharmacy, simply notify our nursing staff.  Recommendations: Keep all of your pain medications in a safe place, under lock and key, even if you live alone. We will NOT replace lost, stolen, or damaged medication. After you fill your prescription, take 1 week's worth of pills and put them away in a safe place. You should keep a separate, properly labeled bottle for this purpose. The remainder should be kept in the original bottle. Use this as your primary supply, until it runs out. Once it's gone, then you know that you have 1 week's worth of medicine, and it is time to come in for a prescription refill. If you do this correctly, it is unlikely that you will ever run out of medicine. To make sure that the above recommendation works, it is very important that you make sure your medication refill appointments are scheduled at least 1 week before you run out of medicine. To do this in an effective manner, make sure that  you do not leave the office without scheduling your next medication management appointment. Always ask the nursing staff to show you in your prescription , when your medication will be running out. Then arrange for the receptionist to get you a return appointment, at least 7 days before you run out of medicine. Do not wait until you have 1 or 2 pills left, to come in. This is very poor planning and does not take into consideration that we may need to cancel appointments due to bad weather, sickness, or emergencies affecting our staff. DO NOT ACCEPT A "Partial Fill": If for any reason your pharmacy does not have enough pills/tablets to completely fill or refill your prescription, do not allow for a "partial fill". The law allows the pharmacy to complete that prescription within 72 hours, without requiring a new prescription. If they do not fill the rest of your prescription within those 72 hours, you will need a separate prescription to fill the remaining amount, which we will NOT provide. If the reason for the partial fill is your insurance, you will need to talk to the pharmacist about payment alternatives for the remaining tablets, but again, DO NOT ACCEPT A PARTIAL FILL, unless you can trust your pharmacist to obtain the remainder of the pills within 72 hours.  Prescription refills and/or changes in medication(s):  Prescription refills, and/or changes in dose or medication, will be conducted only during scheduled medication management appointments. (Applies to both, written and electronic prescriptions.) No refills on procedure days. No medication will be changed or started on procedure days. No changes, adjustments, and/or refills will be conducted on a procedure day. Doing so will interfere with the diagnostic portion   of the procedure. No phone refills. No medications will be "called into the pharmacy". No Fax refills. No weekend refills. No Holliday refills. No after hours refills.  Remember:   Business hours are:  Monday to Thursday 8:00 AM to 4:00 PM Provider's Schedule: Bree Heinzelman, MD - Appointments are:  Medication management: Monday and Wednesday 8:00 AM to 4:00 PM Procedure day: Tuesday and Thursday 7:30 AM to 4:00 PM Bilal Lateef, MD - Appointments are:  Medication management: Tuesday and Thursday 8:00 AM to 4:00 PM Procedure day: Monday and Wednesday 7:30 AM to 4:00 PM (Last update: 02/06/2020) ____________________________________________________________________________________________  ____________________________________________________________________________________________  CBD (cannabidiol) & Delta-8 (Delta-8 tetrahydrocannabinol) WARNING  Intro: Cannabidiol (CBD) and tetrahydrocannabinol (THC), are two natural compounds found in plants of the Cannabis genus. They can both be extracted from hemp or cannabis. Hemp and cannabis come from the Cannabis sativa plant. Both compounds interact with your body's endocannabinoid system, but they have very different effects. CBD does not produce the high sensation associated with cannabis. Delta-8 tetrahydrocannabinol, also known as delta-8 THC, is a psychoactive substance found in the Cannabis sativa plant, of which marijuana and hemp are two varieties. THC is responsible for the high associated with the illicit use of marijuana.  Applicable to: All individuals currently taking or considering taking CBD (cannabidiol) and, more important, all patients taking opioid analgesic controlled substances (pain medication). (Example: oxycodone; oxymorphone; hydrocodone; hydromorphone; morphine; methadone; tramadol; tapentadol; fentanyl; buprenorphine; butorphanol; dextromethorphan; meperidine; codeine; etc.)  Legal status: CBD remains a Schedule I drug prohibited for any use. CBD is illegal with one exception. In the [place], CBD has a limited Food and Drug Administration (FDA) approval for the treatment of two specific types of  epilepsy disorders. Only one CBD product has been approved by the FDA for this purpose: "Epidiolex". FDA is aware that some companies are marketing products containing cannabis and cannabis-derived compounds in ways that violate the Federal Food, Drug and Cosmetic Act (FD&C Act) and that may put the health and safety of consumers at risk. The FDA, a Federal agency, has not enforced the CBD status since 2018. UPDATE: (09/04/2021) The Drug Enforcement Agency (DEA) issued a letter stating that "delta" cannabinoids, including Delta-8-THCO and Delta-9-THCO, synthetically derived from hemp do not qualify as hemp and will be viewed as Schedule I drugs. (Schedule I drugs, substances, or chemicals are defined as drugs with no currently accepted medical use and a high potential for abuse. Some examples of Schedule I drugs are: heroin, lysergic acid diethylamide (LSD), marijuana (cannabis), 3,4-methylenedioxymethamphetamine (ecstasy), methaqualone, and peyote.) (https://www.dea.gov)  Legality: Some manufacturers ship CBD products nationally, which is illegal. Often such products are sold online and are therefore available throughout the country. CBD is openly sold in head shops and health food stores in some states where such sales have not been explicitly legalized. Selling unapproved products with unsubstantiated therapeutic claims is not only a violation of the law, but also can put patients at risk, as these products have not been proven to be safe or effective. Federal illegality makes it difficult to conduct research on CBD.  Reference: "FDA Regulation of Cannabis and Cannabis-Derived Products, Including Cannabidiol (CBD)" - https://www.fda.gov/news-events/public-health-focus/fda-regulation-cannabis-and-cannabis-derived-products-including-cannabidiol-cbd  Warning: CBD is not FDA approved and has not undergo the same manufacturing controls as prescription drugs.  This means that the purity and safety of available  CBD may be questionable. Most of the time, despite manufacturer's claims, it is contaminated with THC (delta-9-tetrahydrocannabinol - the chemical in marijuana responsible for the "HIGH").  When this   is the case, the THC contaminant will trigger a positive urine drug screen (UDS) test for Marijuana (carboxy-THC). Because a positive UDS for any illicit substance is a violation of our medication agreement, your opioid analgesics (pain medicine) may be permanently discontinued. The FDA recently put out a warning about 5 things that everyone should be aware of regarding Delta-8 THC: Delta-8 THC products have not been evaluated or approved by the FDA for safe use and may be marketed in ways that put the public health at risk. The FDA has received adverse event reports involving delta-8 THC-containing products. Delta-8 THC has psychoactive and intoxicating effects. Delta-8 THC manufacturing often involve use of potentially harmful chemicals to create the concentrations of delta-8 THC claimed in the marketplace. The final delta-8 THC product may have potentially harmful by-products (contaminants) due to the chemicals used in the process. Manufacturing of delta-8 THC products may occur in uncontrolled or unsanitary settings, which may lead to the presence of unsafe contaminants or other potentially harmful substances. Delta-8 THC products should be kept out of the reach of children and pets.  MORE ABOUT CBD  General Information: CBD was discovered in 1940 and it is a derivative of the cannabis sativa genus plants (Marijuana and Hemp). It is one of the 113 identified substances found in Marijuana. It accounts for up to 40% of the plant's extract. As of 2018, preliminary clinical studies on CBD included research for the treatment of anxiety, movement disorders, and pain. CBD is available and consumed in multiple forms, including inhalation of smoke or vapor, as an aerosol spray, and by mouth. It may be supplied as  an oil containing CBD, capsules, dried cannabis, or as a liquid solution. CBD is thought not to be as psychoactive as THC (delta-9-tetrahydrocannabinol - the chemical in marijuana responsible for the "HIGH"). Studies suggest that CBD may interact with different biological target receptors in the body, including cannabinoid and other neurotransmitter receptors. As of 2018 the mechanism of action for its biological effects has not been determined.  Side-effects  Adverse reactions: Dry mouth, diarrhea, decreased appetite, fatigue, drowsiness, malaise, weakness, sleep disturbances, and others.  Drug interactions: CBC may interact with other medications such as blood-thinners. Because CBD causes drowsiness on its own, it also increases the drowsiness caused by other medications, including antihistamines (such as Benadryl), benzodiazepines (Xanax, Ativan, Valium), antipsychotics, antidepressants and opioids, as well as alcohol and supplements such as kava, melatonin and St. John's Wort. Be cautious with the following combinations:   Brivaracetam (Briviact) Brivaracetam is changed and broken down by the body. CBD might decrease how quickly the body breaks down brivaracetam. This might increase levels of brivaracetam in the body.  Caffeine Caffeine is changed and broken down by the body. CBD might decrease how quickly the body breaks down caffeine. This might increase levels of caffeine in the body.  Carbamazepine (Tegretol) Carbamazepine is changed and broken down by the body. CBD might decrease how quickly the body breaks down carbamazepine. This might increase levels of carbamazepine in the body and increase its side effects.  Citalopram (Celexa) Citalopram is changed and broken down by the body. CBD might decrease how quickly the body breaks down citalopram. This might increase levels of citalopram in the body and increase its side effects.  Clobazam (Onfi) Clobazam is changed and broken down by the  liver. CBD might decrease how quickly the liver breaks down clobazam. This might increase the effects and side effects of clobazam.  Eslicarbazepine (Aptiom) Eslicarbazepine is changed and   broken down by the body. CBD might decrease how quickly the body breaks down eslicarbazepine. This might increase levels of eslicarbazepine in the body by a small amount.  Everolimus (Zostress) Everolimus is changed and broken down by the body. CBD might decrease how quickly the body breaks down everolimus. This might increase levels of everolimus in the body.  Lithium Taking higher doses of CBD might increase levels of lithium. This can increase the risk of lithium toxicity.  Medications changed by the liver (Cytochrome P450 1A1 (CYP1A1) substrates) Some medications are changed and broken down by the liver. CBD might change how quickly the liver breaks down these medications. This could change the effects and side effects of these medications.  Medications changed by the liver (Cytochrome P450 1A2 (CYP1A2) substrates) Some medications are changed and broken down by the liver. CBD might change how quickly the liver breaks down these medications. This could change the effects and side effects of these medications.  Medications changed by the liver (Cytochrome P450 1B1 (CYP1B1) substrates) Some medications are changed and broken down by the liver. CBD might change how quickly the liver breaks down these medications. This could change the effects and side effects of these medications.  Medications changed by the liver (Cytochrome P450 2A6 (CYP2A6) substrates) Some medications are changed and broken down by the liver. CBD might change how quickly the liver breaks down these medications. This could change the effects and side effects of these medications.  Medications changed by the liver (Cytochrome P450 2B6 (CYP2B6) substrates) Some medications are changed and broken down by the liver. CBD might change how  quickly the liver breaks down these medications. This could change the effects and side effects of these medications.  Medications changed by the liver (Cytochrome P450 2C19 (CYP2C19) substrates) Some medications are changed and broken down by the liver. CBD might change how quickly the liver breaks down these medications. This could change the effects and side effects of these medications.  Medications changed by the liver (Cytochrome P450 2C8 (CYP2C8) substrates) Some medications are changed and broken down by the liver. CBD might change how quickly the liver breaks down these medications. This could change the effects and side effects of these medications.  Medications changed by the liver (Cytochrome P450 2C9 (CYP2C9) substrates) Some medications are changed and broken down by the liver. CBD might change how quickly the liver breaks down these medications. This could change the effects and side effects of these medications.  Medications changed by the liver (Cytochrome P450 2D6 (CYP2D6) substrates) Some medications are changed and broken down by the liver. CBD might change how quickly the liver breaks down these medications. This could change the effects and side effects of these medications.  Medications changed by the liver (Cytochrome P450 2E1 (CYP2E1) substrates) Some medications are changed and broken down by the liver. CBD might change how quickly the liver breaks down these medications. This could change the effects and side effects of these medications.  Medications changed by the liver (Cytochrome P450 3A4 (CYP3A4) substrates) Some medications are changed and broken down by the liver. CBD might change how quickly the liver breaks down these medications. This could change the effects and side effects of these medications.  Medications changed by the liver (Glucuronidated drugs) Some medications are changed and broken down by the liver. CBD might change how quickly the liver breaks  down these medications. This could change the effects and side effects of these medications.  Medications that decrease the breakdown   of other medications by the liver (Cytochrome P450 2C19 (CYP2C19) inhibitors) CBD is changed and broken down by the liver. Some drugs decrease how quickly the liver changes and breaks down CBD. This could change the effects and side effects of CBD.  Medications that decrease the breakdown of other medications in the liver (Cytochrome P450 3A4 (CYP3A4) inhibitors) CBD is changed and broken down by the liver. Some drugs decrease how quickly the liver changes and breaks down CBD. This could change the effects and side effects of CBD.  Medications that increase breakdown of other medications by the liver (Cytochrome P450 3A4 (CYP3A4) inducers) CBD is changed and broken down by the liver. Some drugs increase how quickly the liver changes and breaks down CBD. This could change the effects and side effects of CBD.  Medications that increase the breakdown of other medications by the liver (Cytochrome P450 2C19 (CYP2C19) inducers) CBD is changed and broken down by the liver. Some drugs increase how quickly the liver changes and breaks down CBD. This could change the effects and side effects of CBD.  Methadone (Dolophine) Methadone is broken down by the liver. CBD might decrease how quickly the liver breaks down methadone. Taking cannabidiol along with methadone might increase the effects and side effects of methadone.  Rufinamide (Banzel) Rufinamide is changed and broken down by the body. CBD might decrease how quickly the body breaks down rufinamide. This might increase levels of rufinamide in the body by a small amount.  Sedative medications (CNS depressants) CBD might cause sleepiness and slowed breathing. Some medications, called sedatives, can also cause sleepiness and slowed breathing. Taking CBD with sedative medications might cause breathing problems and/or too  much sleepiness.  Sirolimus (Rapamune) Sirolimus is changed and broken down by the body. CBD might decrease how quickly the body breaks down sirolimus. This might increase levels of sirolimus in the body.  Stiripentol (Diacomit) Stiripentol is changed and broken down by the body. CBD might decrease how quickly the body breaks down stiripentol. This might increase levels of stiripentol in the body and increase its side effects.  Tacrolimus (Prograf) Tacrolimus is changed and broken down by the body. CBD might decrease how quickly the body breaks down tacrolimus. This might increase levels of tacrolimus in the body.  Tamoxifen (Soltamox) Tamoxifen is changed and broken down by the body. CBD might affect how quickly the body breaks down tamoxifen. This might affect levels of tamoxifen in the body.  Topiramate (Topamax) Topiramate is changed and broken down by the body. CBD might decrease how quickly the body breaks down topiramate. This might increase levels of topiramate in the body by a small amount.  Valproate Valproic acid can cause liver injury. Taking cannabidiol with valproic acid might increase the chance of liver injury. CBD and/or valproic acid might need to be stopped, or the dose might need to be reduced.  Warfarin (Coumadin) CBD might increase levels of warfarin, which can increase the risk for bleeding. CBD and/or warfarin might need to be stopped, or the dose might need to be reduced.  Zonisamide Zonisamide is changed and broken down by the body. CBD might decrease how quickly the body breaks down zonisamide. This might increase levels of zonisamide in the body by a small amount. (Last update: 09/16/2021) ____________________________________________________________________________________________  ____________________________________________________________________________________________  Drug Holidays (Slow)  What is a "Drug Holiday"? Drug Holiday: is the name given to  the period of time during which a patient stops taking a medication(s) for the purpose of eliminating tolerance   to the drug.  Benefits Improved effectiveness of opioids. Decreased opioid dose needed to achieve benefits. Improved pain with lesser dose.  What is tolerance? Tolerance: is the progressive decreased in effectiveness of a drug due to its repetitive use. With repetitive use, the body gets use to the medication and as a consequence, it loses its effectiveness. This is a common problem seen with opioid pain medications. As a result, a larger dose of the drug is needed to achieve the same effect that used to be obtained with a smaller dose.  How long should a "Drug Holiday" last? You should stay off of the pain medicine for at least 14 consecutive days. (2 weeks)  Should I stop the medicine "cold turkey"? No. You should always coordinate with your Pain Specialist so that he/she can provide you with the correct medication dose to make the transition as smoothly as possible.  How do I stop the medicine? Slowly. You will be instructed to decrease the daily amount of pills that you take by one (1) pill every seven (7) days. This is called a "slow downward taper" of your dose. For example: if you normally take four (4) pills per day, you will be asked to drop this dose to three (3) pills per day for seven (7) days, then to two (2) pills per day for seven (7) days, then to one (1) per day for seven (7) days, and at the end of those last seven (7) days, this is when the "Drug Holiday" would start.   Will I have withdrawals? By doing a "slow downward taper" like this one, it is unlikely that you will experience any significant withdrawal symptoms. Typically, what triggers withdrawals is the sudden stop of a high dose opioid therapy. Withdrawals can usually be avoided by slowly decreasing the dose over a prolonged period of time. If you do not follow these instructions and decide to stop your  medication abruptly, withdrawals may be possible.  What are withdrawals? Withdrawals: refers to the wide range of symptoms that occur after stopping or dramatically reducing opiate drugs after heavy and prolonged use. Withdrawal symptoms do not occur to patients that use low dose opioids, or those who take the medication sporadically. Contrary to benzodiazepine (example: Valium, Xanax, etc.) or alcohol withdrawals ("Delirium Tremens"), opioid withdrawals are not lethal. Withdrawals are the physical manifestation of the body getting rid of the excess receptors.  Expected Symptoms Early symptoms of withdrawal may include: Agitation Anxiety Muscle aches Increased tearing Insomnia Runny nose Sweating Yawning  Late symptoms of withdrawal may include: Abdominal cramping Diarrhea Dilated pupils Goose bumps Nausea Vomiting  Will I experience withdrawals? Due to the slow nature of the taper, it is very unlikely that you will experience any.  What is a slow taper? Taper: refers to the gradual decrease in dose.  (Last update: 02/06/2020) ____________________________________________________________________________________________   ____________________________________________________________________________________________  Pharmacy Shortages of Pain Medication   Introduction Shockingly as it may seem, .  "No U.[place] Supreme Court decision has ever interpreted the Constitution as guaranteeing a right to health care for all Americans." - https://www.healthequityandpolicylab.com/elusive-right-to-health-care-under-us-law  "With respect to human rights, the [place] has no formally codified right to health, nor does it participate in a human rights treaty that specifies a right to health." - Scott J. Schweikart, JD, MBE  Situation By now, most of our patients have had the experience of being told by their pharmacist that they do not have enough medication to cover their prescription.  If you have   not had this experience, just know that you soon will.  Problem There appears to be a shortage of these medications, either at the national level or locally. This is happening with all pharmacies. When there is not enough medication, patients are offered a partial fill and they are told that they will try to get the rest of the medicine for them at a later time. If they do not have enough for even a partial fill, the pharmacists are telling the patients to call us (the prescribing physicians) to request that we send another prescription to another pharmacy to get the medicine.   This reordering of a controlled substance creates documentation problems where additional paperwork needs to be created to explain why two prescriptions for the same period of time and the same medicine are being prescribed to the same patient. It also creates situations where the last appointment note does not accurately reflect when and what prescriptions were given to a patient. This leads to prescribing errors down the line, in subsequent follow-up visits.   [place] Board of Pharmacy ([place]) Research revealed that Board of Pharmacy Rule .1806 (21 NCAC 46.1806) authorizes pharmacists to the transfer of prescriptions among pharmacies, and it sets forth procedural and recordkeeping requirements for doing so. However, this requires the pharmacist to complete the previously mentioned procedural paperwork to accomplish the transfer. As it turns out, it is much easier for them to have the prescribing physicians do the work.   Possible solutions 1. You can ask your physician to assist you in weaning yourself off these medications. 2. Ask your pharmacy if the medication is in stock, 3 days prior to your refill. 3. If you need a pharmacy change, let us know at your medication management visit. Prescriptions that have already been electronically sent to a pharmacy will not be re-sent to a different pharmacy if your  pharmacy of record does not have it in stock. Proper stocking of medication is a pharmacy problem, not a prescriber problem. Work with your pharmacist to solve the problem. 4. Have the [place] Assembly add a provision to the "STOP ACT" (the law that mandates how controlled substances are prescribed) where there is an exception to the electronic prescribing rule that states that in the event there are shortages of medications the physicians are allowed to use written prescriptions as opposed to electronic ones. This would allow patients to take their prescriptions to a different pharmacy that may have enough medication available to fill the prescription. The problem is that currently there is a law that does not allow for written prescriptions, with the exception of instances where the electronic medical record is down due to technical issues.  5. Have [place] Congress ease the pressure on pharmaceutical companies, allowing them to produce enough quantities of the medication to adequately supply the population. 6. Have pharmacies keep enough stocks of these medications to cover their client base.  7. Have the [place] State Assembly add a provision to the "STOP ACT" where they ease the regulations surrounding the transfer of controlled substances between pharmacies, so as to simplify the transfer of supplies. As an alternative, develop a system to allow patients to obtain the remainder of their prescription at another one of their pharmacies or at an associate pharmacy.   How this shortage will affect you.  Understand that this is a pharmacy supply problem, not a prescriber problem. Work with your pharmacy to solve it. The job of the prescriber is to evaluate   and monitor the patient for the appropriate indications and use of these medicines. It is not the job of the prescriber to supply the medication or to solve problems with that supply. The responsibility and the choice to obtain the  medication resides on the patient. By law, supplying the medication is the job of the pharmacy. It is certainly not the job of the prescriber to solve supply problems.   Due to the above problems we are no longer taking patients to write for their pain medication. Future discussions with your physician may include potentially weaning medications or transitioning to alternatives.  We will be focusing primarily on interventional based pain management. We will continue to evaluate for appropriate indications and we may provide recommendations regarding medication, dose, and schedule, as well as monitoring recommendations, however, we will not be taking over the actual prescribing of these substances. On those patients where we are treating their chronic pain with interventional therapies, exceptions will be considered on a case by case basis. At this time, we will try to continue providing this supplemental service to those patients we have been managing in the past. However, as of August 1st, 2023, we no longer will be sending additional prescriptions to other pharmacies for the purpose of solving their supply problems. Once we send a prescription to a pharmacy, we will not be resending it again to another pharmacy to cover for their shortages.   What to do. Write as many letters as you can. Recruit the help of family members in writing these letters. Below are some of the places where you can write to make your voice heard. Let them know what the problem is and push them to look for solutions.   Search internet for: "[place] find your legislators" https://www.ncleg.gov/findyourlegislators  Search internet for: "[place] insurance commissioner complaints" https://www.ncdoi.gov/contactscomplaints/assistance-or-file-complaint  Search internet for: "[place] Board of Pharmacy complaints" http://www.ncbop.org/contact.htm  Search internet for: "[facility] pharmacy complaints" Email [facility]  Customer Relations https://www.[facility].com/help/email-customer-relations.jsp?callType=store  Search internet for: "[facility] pharmacy customer service complaints" https://www.[facility].com/topic/marketing/contactus/contactus_customerservice.jsp  ____________________________________________________________________________________________   

## 2022-05-23 ENCOUNTER — Emergency Department
Admission: EM | Admit: 2022-05-23 | Discharge: 2022-05-24 | Disposition: A | Discharge status: Home / Self Care | Payer: BLUE CROSS/BLUE SHIELD | Attending: Emergency Medicine | Admitting: Emergency Medicine

## 2022-05-23 ENCOUNTER — Other Ambulatory Visit: Payer: Self-pay

## 2022-05-23 DIAGNOSIS — N492 Inflammatory disorders of scrotum: Secondary | ICD-10-CM | POA: Diagnosis not present

## 2022-05-23 DIAGNOSIS — I861 Scrotal varices: Secondary | ICD-10-CM | POA: Insufficient documentation

## 2022-05-23 DIAGNOSIS — N5089 Other specified disorders of the male genital organs: Secondary | ICD-10-CM | POA: Diagnosis present

## 2022-05-23 LAB — CBC WITH DIFFERENTIAL/PLATELET
Abs Immature Granulocytes: 0.04 10*3/uL (ref 0.00–0.07)
Basophils Absolute: 0.1 10*3/uL (ref 0.0–0.1)
Basophils Relative: 0 %
Eosinophils Absolute: 0.5 10*3/uL (ref 0.0–0.5)
Eosinophils Relative: 4 %
HCT: 39.1 % (ref 39.0–52.0)
Hemoglobin: 13.1 g/dL (ref 13.0–17.0)
Immature Granulocytes: 0 %
Lymphocytes Relative: 22 %
Lymphs Abs: 3 10*3/uL (ref 0.7–4.0)
MCH: 30.5 pg (ref 26.0–34.0)
MCHC: 33.5 g/dL (ref 30.0–36.0)
MCV: 91.1 fL (ref 80.0–100.0)
Monocytes Absolute: 1.3 10*3/uL — ABNORMAL HIGH (ref 0.1–1.0)
Monocytes Relative: 10 %
Neutro Abs: 8.6 10*3/uL — ABNORMAL HIGH (ref 1.7–7.7)
Neutrophils Relative %: 64 %
Platelets: 257 10*3/uL (ref 150–400)
RBC: 4.29 MIL/uL (ref 4.22–5.81)
RDW: 15.2 % (ref 11.5–15.5)
WBC: 13.4 10*3/uL — ABNORMAL HIGH (ref 4.0–10.5)
nRBC: 0 % (ref 0.0–0.2)

## 2022-05-23 MED ORDER — LIDOCAINE-SODIUM BICARBONATE 1-8.4 % IJ SOSY: 1.0000 mL | PREFILLED_SYRINGE | INTRAMUSCULAR | Status: DC | PRN

## 2022-05-23 MED ORDER — SODIUM BICARBONATE 4.2 % IV SOLN
10.0000 mL | Freq: Once | INTRAVENOUS | Status: DC
  Filled 2022-05-23: qty 10

## 2022-05-23 MED ORDER — ONDANSETRON HCL 4 MG/2ML IJ SOLN
4.0000 mg | Freq: Once | INTRAMUSCULAR | Status: AC
  Administered 2022-05-23: 4 mg via INTRAVENOUS
  Filled 2022-05-23: qty 2

## 2022-05-23 MED ORDER — HYDROMORPHONE HCL 1 MG/ML IJ SOLN
1.0000 mg | Freq: Once | INTRAMUSCULAR | Status: AC
  Administered 2022-05-23: 1 mg via INTRAVENOUS
  Filled 2022-05-23: qty 1

## 2022-05-23 MED ORDER — LIDOCAINE HCL (PF) 1 % IJ SOLN
5.0000 mL | Freq: Once | INTRAMUSCULAR | Status: AC
  Administered 2022-05-23: 5 mL

## 2022-05-23 NOTE — ED Provider Notes (Signed)
[facility] Emergency Department Provider Note     Event Date/Time   First MD Initiated Contact with Patient 05/23/22 2140     (approximate)   History   Abscess   HPI  Nathan Lambert Hageman. is a 41 y.o. male with a hidradenitis suppurativa, DDD, chronic pain, prediabetes, and prior back surgery, presents to the ED for evaluation of scrotal swelling as well as a focal abscess to the perineum.  Patient has had spontaneously draining abscess in the past, but denies any previous episodes of abscesses to the groin, scrotum, or testicles.  He denies any prior I&D procedures.  He presents to the ED noting spontaneous purulent drainage as well as fullness to his scrotum.  He denies any testicular pain, urinary retention, fevers, chills, or sweats.  Physical Exam   Triage Vital Signs: ED Triage Vitals [05/23/22 1951]  Enc Vitals Group     BP (!) 161/90     Pulse Rate 95     Resp 18     Temp 99 F (37.2 C)     Temp src      SpO2 99 %     Weight 250 lb (113.4 kg)     Height 5\' 11"  (1.803 m)     Head Circumference      Peak Flow      Pain Score 10     Pain Loc      Pain Edu?      Excl. in GC?     Most recent vital signs: Vitals:   05/23/22 1951 05/23/22 2344  BP: (!) 161/90 127/86  Pulse: 95 82  Resp: 18 18  Temp: 99 F (37.2 C)   SpO2: 99% 96%    General Awake, no distress. NAD CV:  Good peripheral perfusion.  RESP:  Normal effort.  ABD:  No distention.  GU:  External genitalia with exception of some edema to the scrotal sac.  Patient also has a large 3 cm focal raised abscess to the right small gluteal cleft.  There is spontaneous purulent drainage noted with at least 3 punctate openings noted.   ED Results / Procedures / Treatments   Labs (all labs ordered are listed, but only abnormal results are displayed) Labs Reviewed  CBC WITH DIFFERENTIAL/PLATELET - Abnormal; Notable for the following components:      Result Value   WBC  13.4 (*)    Neutro Abs 8.6 (*)    Monocytes Absolute 1.3 (*)    All other components within normal limits  COMPREHENSIVE METABOLIC PANEL     EKG   RADIOLOGY  I personally viewed and evaluated these images as part of my medical decision making, as well as reviewing the written report by the radiologist.  ED Provider Interpretation: scrotal induration noted  CT PELVIS W CONTRAST  Result Date: 05/24/2022 CLINICAL DATA:  Patient complains of abscess on his left testicle EXAM: CT PELVIS WITH CONTRAST TECHNIQUE: Multidetector CT imaging of the pelvis was performed using the standard protocol following the bolus administration of intravenous contrast. RADIATION DOSE REDUCTION: This exam was performed according to the departmental dose-optimization program which includes automated exposure control, adjustment of the mA and/or kV according to patient size and/or use of iterative reconstruction technique. CONTRAST:  13/12/2021 OMNIPAQUE IOHEXOL 300 MG/ML  SOLN COMPARISON:  None Available. FINDINGS: Urinary Tract:  No abnormality visualized. Bowel:  Unremarkable visualized pelvic bowel loops. Vascular/Lymphatic: Enlarged bilateral inguinal lymph nodes measuring up to 1.8 cm on the  left and 1.7 cm on the right. Reproductive:  Bilateral hydroceles. Other: Edema within the scrotum extending into the peroneal subcutaneous fat. No subcutaneous gas. Musculoskeletal: No suspicious bone lesions identified. IMPRESSION: Bilateral varicoceles. Nonspecific soft tissue edema within the scrotal wall extending into the perineum subcutaneous fat. No subcutaneous gas. No drainable fluid collection. Enlarged bilateral inguinal nodes likely reactive given history of scrotal abscesses. Electronically Signed   By: Minerva Fester M.D.   On: 05/24/2022 00:35     PROCEDURES:  Critical Care performed: No  ..Incision and Drainage  Date/Time: 05/23/2022 12:47 PM  Performed by: Lissa Hoard, PA-C Authorized by:  Lissa Hoard, PA-C   Consent:    Consent obtained:  Verbal   Consent given by:  Patient   Risks, benefits, and alternatives were discussed: yes     Risks discussed:  Incomplete drainage, infection, pain and bleeding   Alternatives discussed:  Alternative treatment Universal protocol:    Imaging studies available: yes     Site/side marked: yes     Patient identity confirmed:  Verbally with patient Location:    Type:  Abscess   Size:  4   Location:  Anogenital   Anogenital location:  Scrotal wall Pre-procedure details:    Skin preparation:  Povidone-iodine Sedation:    Sedation type:  None Anesthesia:    Anesthesia method:  Local infiltration   Local anesthetic:  Sodium bicarbonate and lidocaine 1% w/o epi Procedure type:    Complexity:  Simple Procedure details:    Ultrasound guidance: no     Needle aspiration: no     Incision types:  Single straight   Incision depth:  Subcutaneous   Wound management:  Probed and deloculated and irrigated with saline   Drainage:  Bloody and purulent   Drainage amount:  Moderate   Wound treatment:  Wound left open   Packing materials:  1/4 in gauze   Amount 1/4":  6 Post-procedure details:    Procedure completion:  Tolerated well, no immediate complications    MEDICATIONS ORDERED IN ED: Medications  sodium bicarbonate 4.2 % injection 10 mL (has no administration in time range)  doxycycline (VIBRA-TABS) tablet 100 mg (has no administration in time range)  ondansetron (ZOFRAN) injection 4 mg (4 mg Intravenous Given 05/23/22 2343)  HYDROmorphone (DILAUDID) injection 1 mg (1 mg Intravenous Given 05/23/22 2343)  lidocaine (PF) (XYLOCAINE) 1 % injection 5 mL (5 mLs Infiltration Given by Other 05/23/22 2330)  iohexol (OMNIPAQUE) 300 MG/ML solution 100 mL (100 mLs Intravenous Contrast Given 05/24/22 0021)     IMPRESSION / MDM / ASSESSMENT AND PLAN / ED COURSE  I reviewed the triage vital signs and the nursing notes.                               Differential diagnosis includes, but is not limited to, scrotal abscess, perineal abscess, Fournier's gangrene, hidradenitis suppurativa  Patient's presentation is most consistent with acute complicated illness / injury requiring diagnostic workup.  Patient's diagnosis is consistent with scrotal wall abscess.  Patient will be discharged home with prescriptions for doxycycline.  Patient is to follow up with primary provider or urology as needed or otherwise directed. Patient is given ED precautions to return to the ED for any worsening or new symptoms.     FINAL CLINICAL IMPRESSION(S) / ED DIAGNOSES   Final diagnoses:  Scrotal abscess  Varicocele     Rx /  DC Orders   ED Discharge Orders          Ordered    doxycycline (VIBRA-TABS) 100 MG tablet  2 times daily        05/24/22 0107             Note:  This document was prepared using Dragon voice recognition software and may include unintentional dictation errors.    Melvenia Needles, PA-C 05/24/22 0109    Arta Silence, MD 05/24/22 2351

## 2022-05-23 NOTE — ED Triage Notes (Signed)
Pt arrives with c/o a abscess on his testicles. Per pt, he has had issues with abscesses before and this one will not go away. Pt denies fevers. Pt endorses drainage.

## 2022-05-24 ENCOUNTER — Emergency Department: Payer: BLUE CROSS/BLUE SHIELD

## 2022-05-24 LAB — COMPREHENSIVE METABOLIC PANEL
ALT: 12 U/L (ref 0–44)
AST: 16 U/L (ref 15–41)
Albumin: 4 g/dL (ref 3.5–5.0)
Alkaline Phosphatase: 51 U/L (ref 38–126)
Anion gap: 7 (ref 5–15)
BUN: 9 mg/dL (ref 6–20)
CO2: 26 mmol/L (ref 22–32)
Calcium: 9.1 mg/dL (ref 8.9–10.3)
Chloride: 107 mmol/L (ref 98–111)
Creatinine, Ser: 0.76 mg/dL (ref 0.61–1.24)
GFR, Estimated: >60 mL/min (ref >60)
Glucose, Bld: 94 mg/dL (ref 70–99)
Potassium: 3.9 mmol/L (ref 3.5–5.1)
Sodium: 140 mmol/L (ref 135–145)
Total Bilirubin: 0.7 mg/dL (ref 0.3–1.2)
Total Protein: 8 g/dL (ref 6.5–8.1)

## 2022-05-24 MED ORDER — DOXYCYCLINE HYCLATE 100 MG PO TABS
100.0000 mg | ORAL_TABLET | Freq: Once | ORAL | Status: AC
  Administered 2022-05-24: 100 mg via ORAL
  Filled 2022-05-24: qty 1

## 2022-05-24 MED ORDER — IOHEXOL 300 MG/ML  SOLN
100.0000 mL | Freq: Once | INTRAMUSCULAR | Status: AC | PRN
  Administered 2022-05-24: 100 mL via INTRAVENOUS

## 2022-05-24 MED ORDER — DOXYCYCLINE HYCLATE 100 MG PO TABS: 100.0000 mg | ORAL_TABLET | Freq: Two times a day (BID) | ORAL | 0 refills | Status: AC

## 2022-05-24 NOTE — Discharge Instructions (Addendum)
Keep the wound clean, dry, and covered.  Apply warm compresses to promote healing.  Remove the packing in 3 days.  Antibiotic as directed until all pills are gone.  Take OTC ibuprofen 600-800 mg, 3 times daily with food as needed for additional pain relief.  Up in the ED or with your primary care provider for ongoing symptoms.

## 2022-07-25 NOTE — Progress Notes (Signed)
PROVIDER NOTE: Information contained herein reflects review and annotations entered in association with encounter. Interpretation of such information and data should be left to medically-trained personnel. Information provided to patient can be located elsewhere in the medical record under "Patient Instructions". Document created using STT-dictation technology, any transcriptional errors that may result from process are unintentional.    Patient: Nathan Lambert.  Service Category: E/M  Provider: Oswaldo Done, MD  DOB: 12-08-1980  DOS: 07/26/2022  Referring Provider: Smith Robert, MD  MRN: [id number]  Specialty: Interventional Pain Management  PCP: Smith Robert, MD  Type: Established Patient  Setting: Ambulatory outpatient    Location: Office  Delivery: Face-to-face     HPI  [name], a 42 y.o. year old male, is here today because of his Chronic pain syndrome [G89.4]. Nathan Lambert primary complain today is Back Pain (lower) Last encounter: My last encounter with him was on 04/28/2022. 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); Chronic lower extremity pain (3ry area of Pain) (Bilateral) (L>R); Lumbar facet hypertrophy (Bilateral); Lumbar facet syndrome (Bilateral) (R>L); Neurogenic pain; Spondylosis without myelopathy or radiculopathy, lumbar region; Other specified dorsopathies, sacral and sacrococcygeal region; Lumbar spondylosis; Chronic upper extremity pain (Left); Pain and numbness of left upper extremity; Cervical radiculitis (C6/C7)  (Left); DDD (degenerative disc disease), cervical; Cervicalgia; Osteoarthritis involving multiple joints; Chronic hip pain (4th area of Pain) (Bilateral) (L>R); Greater trochanteric bursitis (Left); Lumbar back pain with radiculopathy affecting left lower extremity (L5 dermatomal distribution); Subacute lumbar radiculopathy (L5) (Left); Chronic low back pain (1ry area of Pain) (Bilateral) w/o sciatica; Decreased range of motion of both hips; Chronic hip pain (Left); Enthesopathy of hip region (Left); Other bursitis of hip (Left); and Chronic groin pain (Left) on their pertinent problem list. Pain Assessment: Severity of Chronic pain is reported as a 2 /10. Location: Back Right, Left/Denies. Onset: More than a month ago. Quality: Aching, Burning, Throbbing, Discomfort. Timing: Constant. Modifying factor(s): Meds. Vitals:  height is 5\' 11"  (1.803 m) and weight is 250 lb (113.4 kg). His temperature is 97.4 F (36.3 C) (abnormal). His blood pressure is 124/90 (abnormal) and his pulse is 70. His oxygen saturation is 100%.  BMI: Estimated body mass index is 34.87 kg/m as calculated from the following:   Height as of this encounter: 5\' 11"  (1.803 m).   Weight as of this encounter: 250 lb (113.4 kg).  Reason for encounter: medication management.  The patient indicates doing well with the current medication regimen. No adverse reactions or side effects reported to the medications.  The patient does indicate having some opioid-induced constipation.  When asked about what he takes for that he indicated taking some oral Epsom salts (magnesium sulfate).  He indicates that this is an old remedy given to him by his grandmother.  He also indicates that it is very effective in moving his bowels, better than taking any over over-the-counter medication.  According to a review that I did on the remedy, magnesium sulfate is used for short-term relief of constipation.  It is also used as a soaking solution to relieve minor  sprains, bruises, muscle aches or discomfort, joint stiffness or soreness, and tired feet.  It is a laxative and a soaking solution.  It is believed to be generally safe but there are some few negative effects that can occur when you take it by mouth.  It will trigger diarrhea, bloating, or upset stomach.  When used as a laxative it is encouraged that the patient take plenty of water which may reduce the digestive discomfort.  In extreme cases, magnesium overdose can lead to heart problems, altered mental status, and death.  In using Epsom salts for constipation the typical recommended dose would be to dissolve 2 to 4 level teaspoons of Epsom salt in 8 ounces of water and drink the mixture immediately. Epsom salt usually produces a bowel movement within 30 minutes to six hours.  Based on my review, send as his kidney function seems to be within normal limits, I see no immediate contraindications for him to continue with this remedy.  RTCB: 10/29/2022  Nonopioids transferred 05/07/2020: Magnesium, vitamin D3, and calcium  Pharmacotherapy Assessment  Analgesic: Oxycodone IR 10 mg 4 times daily. MME/day: 60 mg/day.   Monitoring: [place] PMP: PDMP reviewed during this encounter.       Pharmacotherapy: No side-effects or adverse reactions reported. Compliance: No problems identified. Effectiveness: Clinically acceptable.  Brigitte Pulse, RN  07/26/2022 12:56 PM  Sign when Signing Visit Nursing Pain Medication Assessment:  Safety precautions to be maintained throughout the outpatient stay will include: orient to surroundings, keep bed in low position, maintain call bell within reach at all times, provide assistance with transfer out of bed and ambulation.  Medication Inspection Compliance: Pill count conducted under aseptic conditions, in front of the patient. Neither the pills nor the bottle was removed from the patient's sight at any time. Once count was completed pills were immediately returned to the  patient in their original bottle.  Medication: Oxycodone IR Pill/Patch Count:  21 of 120 pills remain Pill/Patch Appearance: Markings consistent with prescribed medication Bottle Appearance: Standard pharmacy container. Clearly labeled. Filled Date: 72 / 14 / 2023 Last Medication intake:  TodaySafety precautions to be maintained throughout the outpatient stay will include: orient to surroundings, keep bed in low position, maintain call bell within reach at all times, provide assistance with transfer out of bed and ambulation.     No results found for: "CBDTHCR" No results found for: "D8THCCBX" No results found for: "D9THCCBX"  UDS:  Summary  Date Value Ref Range Status  01/27/2022 Note  Final    Comment:    ==================================================================== [facility] (MW) ==================================================================== Test                             Result       Flag       Units  Drug Present and Declared for Prescription Verification   Oxycodone                      766          EXPECTED   ng/mg creat   Oxymorphone                    1593         EXPECTED   ng/mg creat   Noroxycodone                   592          EXPECTED   ng/mg creat   Noroxymorphone  304          EXPECTED   ng/mg creat    Sources of oxycodone are scheduled prescription medications.    Oxymorphone, noroxycodone, and noroxymorphone are expected    metabolites of oxycodone. Oxymorphone is also available as a    scheduled prescription medication.  ==================================================================== Test                      Result    Flag   Units      Ref Range   Creatinine              178              mg/dL      >=78 ==================================================================== Declared Medications:  The flagging and interpretation on this report are based on the  following declared medications.  Unexpected results may  arise from  inaccuracies in the declared medications.   **Note: The testing scope of this panel includes these medications:   Oxycodone   **Note: The testing scope of this panel does not include the  following reported medications:   Azelastine (Astelin)  Clobetasol (Temovate)  Econazole  Famotidine (Pepcid)  Fluticasone (Flonase)  Gentamicin  Glipizide (Glucotrol)  Loratadine (Claritin)  Magnesium  Montelukast (Singulair)  Naloxone (Narcan)  Terbinafine (Lamisil)  Testosterone ==================================================================== For clinical consultation, please call [phone]. ====================================================================       ROS  Constitutional: Denies any fever or chills Gastrointestinal: No reported hemesis, hematochezia, vomiting, or acute GI distress Musculoskeletal: Denies any acute onset joint swelling, redness, loss of ROM, or weakness Neurological: No reported episodes of acute onset apraxia, aphasia, dysarthria, agnosia, amnesia, paralysis, loss of coordination, or loss of consciousness  Medication Review  Magnesium, Magnesium Oxide -Mg Supplement, Oxycodone HCl, azelastine, clobetasol ointment, doxycycline, econazole nitrate, famotidine, fluticasone, gentamicin cream, glipiZIDE, loratadine, montelukast, naloxone, terbinafine, and testosterone cypionate  History Review  Allergy: Nathan Lambert is allergic to bactrim [sulfamethoxazole-trimethoprim], pollen extract, sulfa antibiotics, and penicillins. Drug: Nathan Lambert  reports no history of drug use. Alcohol:  reports no history of alcohol use. Tobacco:  reports that he has been smoking cigarettes. He has a 10.00 pack-year smoking history. He has never used smokeless tobacco. Social: Nathan Lambert  reports that he has been smoking cigarettes. He has a 10.00 pack-year smoking history. He has never used smokeless tobacco. He reports that he does not drink  alcohol and does not use drugs. Medical:  has a past medical history of Allergy (2012), Chronic low back pain (Primary Area of Pain) (Bilateral) (R>L), Degeneration of lumbar or lumbosacral intervertebral disc (L4-L5) (12/27/2016), Gastroesophageal reflux disease without esophagitis (10/26/2021), Headache, Mixed hyperlipidemia (10/26/2021), and Opiate use (02/15/2017). Surgical: Mr. Hurry  has a past surgical history that includes Fracture surgery (Left); Spine surgery (Right, 06/16/2015); and Back surgery. Family: family history includes AAA (abdominal aortic aneurysm) in his mother; Diabetes in his father; Stroke in his mother.  Laboratory Chemistry Profile   Renal Lab Results  Component Value Date   BUN 9 05/23/2022   CREATININE 0.76 05/23/2022   BCR 7 (L) 12/27/2016   GFRAA >60 06/07/2019   GFRNONAA >60 05/23/2022    Hepatic Lab Results  Component Value Date   AST 16 05/23/2022   ALT 12 05/23/2022   ALBUMIN 4.0 05/23/2022   ALKPHOS 51 05/23/2022    Electrolytes Lab Results  Component Value Date   NA 140 05/23/2022   K 3.9 05/23/2022   CL 107 05/23/2022   CALCIUM 9.1  05/23/2022   MG 2.0 06/07/2019    Bone Lab Results  Component Value Date   VD25OH 15.51 (L) 06/07/2019   25OHVITD1 17 (L) 12/27/2016   25OHVITD2 <1.0 12/27/2016   25OHVITD3 17 12/27/2016   TESTOFREE 2.6 (L) 06/08/2017   TESTOSTERONE 116 (L) 06/08/2017    Inflammation (CRP: Acute Phase) (ESR: Chronic Phase) Lab Results  Component Value Date   CRP 2.7 (H) 06/07/2019   ESRSEDRATE 17 (H) 06/07/2019         Note: Above Lab results reviewed.  Recent Imaging Review  CT PELVIS W CONTRAST CLINICAL DATA:  Patient complains of abscess on his left testicle  EXAM: CT PELVIS WITH CONTRAST  TECHNIQUE: Multidetector CT imaging of the pelvis was performed using the standard protocol following the bolus administration of intravenous contrast.  RADIATION DOSE REDUCTION: This exam was performed according  to the departmental dose-optimization program which includes automated exposure control, adjustment of the mA and/or kV according to patient size and/or use of iterative reconstruction technique.  CONTRAST:  OMNIPAQUE IOHEXOL 300 MG/ML  SOLN  COMPARISON:  None Available.  FINDINGS: Urinary Tract:  No abnormality visualized.  Bowel:  Unremarkable visualized pelvic bowel loops.  Vascular/Lymphatic: Enlarged bilateral inguinal lymph nodes measuring up to 1.8 cm on the left and 1.7 cm on the right.  Reproductive:  Bilateral hydroceles.  Other: Edema within the scrotum extending into the peroneal subcutaneous fat. No subcutaneous gas.  Musculoskeletal: No suspicious bone lesions identified.  IMPRESSION: Bilateral varicoceles.  Nonspecific soft tissue edema within the scrotal wall extending into the perineum subcutaneous fat. No subcutaneous gas. No drainable fluid collection.  Enlarged bilateral inguinal nodes likely reactive given history of scrotal abscesses.  Electronically Signed   By: Minerva Fester M.D.   On: 05/24/2022 00:35 Note: Reviewed        Physical Exam  General appearance: Well nourished, well developed, and well hydrated. In no apparent acute distress Mental status: Alert, oriented x 3 (person, place, & time)       Respiratory: No evidence of acute respiratory distress Eyes: PERLA Vitals: BP (!) 124/90   Pulse 70   Temp (!) 97.4 F (36.3 C)   Ht 5\' 11"  (1.803 m)   Wt 250 lb (113.4 kg)   SpO2 100%   BMI 34.87 kg/m  BMI: Estimated body mass index is 34.87 kg/m as calculated from the following:   Height as of this encounter: 5\' 11"  (1.803 m).   Weight as of this encounter: 250 lb (113.4 kg). Ideal: Ideal body weight: 75.3 kg (166 lb 0.1 oz) Adjusted ideal body weight: 90.5 kg (199 lb 9.7 oz)  Assessment   Diagnosis Status  1. Chronic pain syndrome   2. Chronic low back pain (1ry area of Pain) (Bilateral) w/o sciatica   3. Chronic knee  pain (2ry area of Pain) (Left)   4. Chronic lower extremity pain (3ry area of Pain) (Bilateral) (L>R)   5. Chronic hip pain (4th area of Pain) (Bilateral) (L>R)   6. 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 Pharmacologic therapy   8. Chronic use of opiate for therapeutic purpose   9. Encounter for medication management   10. Encounter for chronic pain management   11. Therapeutic opioid-induced constipation (OIC)    Controlled Controlled Controlled   Updated Problems: Problem  Therapeutic Opioid-Induced Constipation (Oic)    Plan of Care  Problem-specific:  No problem-specific Assessment & Plan notes found for this encounter.  Nathan Lambert  Montez Hageman. has a current medication list which includes the following long-term medication(s): azelastine, famotidine, fluticasone, glipizide, loratadine, magnesium oxide -mg supplement, montelukast, naloxone, [START ON 07/31/2022] oxycodone hcl, [START ON 08/30/2022] oxycodone hcl, [START ON 09/29/2022] oxycodone hcl, and magnesium.  Pharmacotherapy (Medications Ordered): Meds ordered this encounter  Medications   Oxycodone HCl 10 MG TABS    Sig: Take 1 tablet (10 mg total) by mouth every 6 (six) hours as needed. Must last 30 days    Dispense:  120 tablet    Refill:  0    DO NOT: delete (not duplicate); no partial-fill (will deny script to complete), no refill request (F/U required). DISPENSE: 1 day early if closed on fill date. WARN: No CNS-depressants within 8 hrs of med.   Oxycodone HCl 10 MG TABS    Sig: Take 1 tablet (10 mg total) by mouth every 6 (six) hours as needed. Must last 30 days    Dispense:  120 tablet    Refill:  0    DO NOT: delete (not duplicate); no partial-fill (will deny script to complete), no refill request (F/U required). DISPENSE: 1 day early if closed on fill date. WARN: No CNS-depressants within 8 hrs of med.   Oxycodone HCl 10 MG TABS    Sig: Take 1 tablet (10 mg total) by mouth every 6 (six)  hours as needed. Must last 30 days    Dispense:  120 tablet    Refill:  0    DO NOT: delete (not duplicate); no partial-fill (will deny script to complete), no refill request (F/U required). DISPENSE: 1 day early if closed on fill date. WARN: No CNS-depressants within 8 hrs of med.   Orders:  No orders of the defined types were placed in this encounter.  Follow-up plan:   Return in about 3 months (around 10/29/2022) for Eval-day (M,W), (F2F), (MM).     Interventional Therapies  Risk  Complexity Considerations:   Estimated body mass index is 36.26 kg/m as calculated from the following:   Height as of this encounter: 5\' 11"  (1.803 m).   Weight as of this encounter: 260 lb (117.9 kg). WNL   Planned  Pending:      Under consideration:   Possible left SI joint RFA Diagnostic caudal ESI + diagnostic epidurogram  Possible Racz procedure  Diagnostic left IA knee injection (w/ steroid)  Possible left IA Hyalgan knee injections  Diagnostic left Genicular NB  Possible left Genicular nerve RFA    Completed:   Diagnostic/therapeutic left IA hip joint + bursa injection x1 (11/25/2020) (100/100/0/0)  Therapeutic right lumbar facet MBB x3 (05/15/2020) (100/100/50/50)  Therapeutic left lumbar facet MBB x3 (05/15/2020) 100/100/50/50)  Therapeutic right lumbar facet RFA x1 (10/11/2017) NR Therapeutic left lumbar facet RFA x1 (11/24/2017) NR Therapeutic right SI joint block x1 (05/19/2017)  Therapeutic left SI joint block x1 (05/19/2017)  Therapeutic right SI joint RFA x1 (10/11/2017)  Therapeutic right L4 TFES2 x1 (05/30/2018)    Therapeutic  Palliative (PRN) options:   Palliative bilateral lumbar facet block #4 Palliative bilateral SI joint block #2  Diagnostic right L4 TFES2 #2    Pharmacotherapy  Nonopioids transferred 05/07/2020: Magnesium, vitamin D3, and calcium      Recent Visits Date Type Provider Dept  04/28/22 Office Visit Delano Metz, MD [facility]  Showing  recent visits within past 90 days and meeting all other requirements Today's Visits Date Type Provider Dept  07/26/22 Office Visit Delano Metz, MD [facility]  Showing today's  visits and meeting all other requirements Future Appointments No visits were found meeting these conditions. Showing future appointments within next 90 days and meeting all other requirements  I discussed the assessment and treatment plan with the patient. The patient was provided an opportunity to ask questions and all were answered. The patient agreed with the plan and demonstrated an understanding of the instructions.  Patient advised to call back or seek an in-person evaluation if the symptoms or condition worsens.  Duration of encounter: 30 minutes.  Total time on encounter, as per AMA guidelines included both the face-to-face and non-face-to-face time personally spent by the physician and/or other qualified health care professional(s) on the day of the encounter (includes time in activities that require the physician or other qualified health care professional and does not include time in activities normally performed by clinical staff). Physician's time may include the following activities when performed: Preparing to see the patient (e.g., pre-charting review of records, searching for previously ordered imaging, lab work, and nerve conduction tests) Review of prior analgesic pharmacotherapies. Reviewing PMP Interpreting ordered tests (e.g., lab work, imaging, nerve conduction tests) Performing post-procedure evaluations, including interpretation of diagnostic procedures Obtaining and/or reviewing separately obtained history Performing a medically appropriate examination and/or evaluation Counseling and educating the patient/family/caregiver Ordering medications, tests, or procedures Referring and communicating with other health care professionals (when not separately reported) Documenting  clinical information in the electronic or other health record Independently interpreting results (not separately reported) and communicating results to the patient/ family/caregiver Care coordination (not separately reported)  Note by: Oswaldo Done, MD Date: 07/26/2022; Time: 1:19 PM

## 2022-07-26 ENCOUNTER — Ambulatory Visit: Payer: BLUE CROSS/BLUE SHIELD | Attending: Pain Medicine | Admitting: Pain Medicine

## 2022-07-26 ENCOUNTER — Encounter: Payer: Self-pay | Admitting: Pain Medicine

## 2022-07-26 VITALS — BP 124/90 | HR 70 | Temp 97.4°F | Ht 71.0 in | Wt 250.0 lb

## 2022-07-26 DIAGNOSIS — Z79899 Other long term (current) drug therapy: Secondary | ICD-10-CM | POA: Diagnosis present

## 2022-07-26 DIAGNOSIS — M25551 Pain in right hip: Secondary | ICD-10-CM | POA: Diagnosis present

## 2022-07-26 DIAGNOSIS — Z79891 Long term (current) use of opiate analgesic: Secondary | ICD-10-CM | POA: Diagnosis present

## 2022-07-26 DIAGNOSIS — M25562 Pain in left knee: Secondary | ICD-10-CM

## 2022-07-26 DIAGNOSIS — M545 Low back pain, unspecified: Secondary | ICD-10-CM | POA: Diagnosis not present

## 2022-07-26 DIAGNOSIS — M961 Postlaminectomy syndrome, not elsewhere classified: Secondary | ICD-10-CM | POA: Diagnosis present

## 2022-07-26 DIAGNOSIS — T402X5A Adverse effect of other opioids, initial encounter: Secondary | ICD-10-CM | POA: Insufficient documentation

## 2022-07-26 DIAGNOSIS — M79605 Pain in left leg: Secondary | ICD-10-CM | POA: Diagnosis not present

## 2022-07-26 DIAGNOSIS — G8929 Other chronic pain: Secondary | ICD-10-CM | POA: Insufficient documentation

## 2022-07-26 DIAGNOSIS — M79604 Pain in right leg: Secondary | ICD-10-CM | POA: Diagnosis present

## 2022-07-26 DIAGNOSIS — K5903 Drug induced constipation: Secondary | ICD-10-CM

## 2022-07-26 DIAGNOSIS — G894 Chronic pain syndrome: Secondary | ICD-10-CM

## 2022-07-26 DIAGNOSIS — M25552 Pain in left hip: Secondary | ICD-10-CM | POA: Insufficient documentation

## 2022-07-26 MED ORDER — OXYCODONE HCL 10 MG PO TABS: 10.0000 mg | ORAL_TABLET | Freq: Four times a day (QID) | ORAL | 0 refills | Status: DC | PRN

## 2022-07-26 NOTE — Progress Notes (Signed)
Nursing Pain Medication Assessment:  Safety precautions to be maintained throughout the outpatient stay will include: orient to surroundings, keep bed in low position, maintain call bell within reach at all times, provide assistance with transfer out of bed and ambulation.  Medication Inspection Compliance: Pill count conducted under aseptic conditions, in front of the patient. Neither the pills nor the bottle was removed from the patient's sight at any time. Once count was completed pills were immediately returned to the patient in their original bottle.  Medication: Oxycodone IR Pill/Patch Count:  21 of 120 pills remain Pill/Patch Appearance: Markings consistent with prescribed medication Bottle Appearance: Standard pharmacy container. Clearly labeled. Filled Date: 67 / 14 / 2023 Last Medication intake:  TodaySafety precautions to be maintained throughout the outpatient stay will include: orient to surroundings, keep bed in low position, maintain call bell within reach at all times, provide assistance with transfer out of bed and ambulation.

## 2022-07-26 NOTE — Patient Instructions (Signed)
____________________________________________________________________________________________  Patient Information update  To: All of our patients.  Re: Name change.  It has been made official that our current name, " [facility]"   will soon be changed to "[facility]".   The purpose of this change is to eliminate any confusion created by the concept of our practice being a "Medication Management Pain Clinic". In the past this has led to the misconception that we treat pain primarily by the use of prescription medications.  Nothing can be farther from the truth.   Understanding PAIN MANAGEMENT: To further understand what our practice does, you first have to understand that "Pain Management" is a subspecialty that requires additional training once a physician has completed their specialty training, which can be in either Anesthesia, Neurology, Psychiatry, or Physical Medicine and Rehabilitation (PMR). Each one of these contributes to the final approach taken by each physician to the management of their patient's pain. To be a "Pain Management Specialist" you must have first completed one of the specialty trainings below.  Anesthesiologists - trained in clinical pharmacology and interventional techniques such as nerve blockade and regional as well as central neuroanatomy. They are trained to block pain before, during, and after surgical interventions.  Neurologists - trained in the diagnosis and pharmacological treatment of complex neurological conditions, such as Multiple Sclerosis, Parkinson's, spinal cord injuries, and other systemic conditions that may be associated with symptoms that may include but are not limited to pain. They tend to rely primarily on the treatment of chronic pain using prescription medications.  Psychiatrist - trained in conditions affecting the psychosocial  wellbeing of patients including but not limited to depression, anxiety, schizophrenia, personality disorders, addiction, and other substance use disorders that may be associated with chronic pain. They tend to rely primarily on the treatment of chronic pain using prescription medications.   Physical Medicine and Rehabilitation (PMR) physicians, also known as physiatrists - trained to treat a wide variety of medical conditions affecting the brain, spinal cord, nerves, bones, joints, ligaments, muscles, and tendons. Their training is primarily aimed at treating patients that have suffered injuries that have caused severe physical impairment. Their training is primarily aimed at the physical therapy and rehabilitation of those patients. They may also work alongside orthopedic surgeons or neurosurgeons using their expertise in assisting surgical patients to recover after their surgeries.  INTERVENTIONAL PAIN MANAGEMENT is sub-subspecialty of Pain Management.  Our physicians are Board-certified in Anesthesia, Pain Management, and Interventional Pain Management.  This meaning that not only have they been trained and Board-certified in their specialty of Anesthesia, and subspecialty of Pain Management, but they have also received further training in the sub-subspecialty of Interventional Pain Management, in order to become Board-certified as INTERVENTIONAL PAIN MANAGEMENT SPECIALIST.    Mission: Our goal is to use our skills in  INTERVENTIONAL PAIN MANAGEMENT as alternatives to the chronic use of prescription opioid medications for the treatment of pain. To make this more clear, we have changed our name to reflect what we do and offer. We will continue to offer medication management assessment and recommendations, but we will not be taking over any patient's medication management.  ____________________________________________________________________________________________      _______________________________________________________________________  Medication Rules  Purpose: To inform patients, and their family members, of our medication rules and regulations.  Applies to: All patients receiving prescriptions from our practice (written or electronic).  Pharmacy of record: This is the pharmacy where your electronic prescriptions   will be sent. Make sure we have the correct one.  Electronic prescriptions: In compliance with the [place] Strengthen Opioid Misuse Prevention (STOP) Act of 2017 (Session Law 2017-74/H243), effective July 19, 2018, all controlled substances must be electronically prescribed. Written prescriptions, faxing, or calling prescriptions to a pharmacy will no longer be done.  Prescription refills: These will be provided only during in-person appointments. No medications will be renewed without a "face-to-face" evaluation with your provider. Applies to all prescriptions.  NOTE: The following applies primarily to controlled substances (Opioid* Pain Medications).   Type of encounter (visit): For patients receiving controlled substances, face-to-face visits are required. (Not an option and not up to the patient.)  Patient's responsibilities: Pain Pills: Bring all pain pills to every appointment (except for procedure appointments). Pill Bottles: Bring pills in original pharmacy bottle. Bring bottle, even if empty. Always bring the bottle of the most recent fill.  Medication refills: You are responsible for knowing and keeping track of what medications you are taking and when is it that you will need a refill. The day before your appointment: write a list of all prescriptions that need to be refilled. The day of the appointment: give the list to the admitting nurse. Prescriptions will be written only during appointments. No prescriptions will be written on procedure days. If you forget a medication: it will not be "Called in", "Faxed", or  "electronically sent". You will need to get another appointment to get these prescribed. No early refills. Do not call asking to have your prescription filled early. Partial  or short prescriptions: Occasionally your pharmacy may not have enough pills to fill your prescription.  NEVER ACCEPT a partial fill or a prescription that is short of the total amount of pills that you were prescribed.  With controlled substances the law allows 72 hours for the pharmacy to complete the prescription.  If the prescription is not completed within 72 hours, the pharmacist will require a new prescription to be written. This means that you will be short on your medicine and we WILL NOT send another prescription to complete your original prescription.  Instead, request the pharmacy to send a carrier to a nearby branch to get enough medication to provide you with your full prescription. Prescription Accuracy: You are responsible for carefully inspecting your prescriptions before leaving our office. Have the discharge nurse carefully go over each prescription with you, before taking them home. Make sure that your name is accurately spelled, that your address is correct. Check the name and dose of your medication to make sure it is accurate. Check the number of pills, and the written instructions to make sure they are clear and accurate. Make sure that you are given enough medication to last until your next medication refill appointment. Taking Medication: Take medication as prescribed. When it comes to controlled substances, taking less pills or less frequently than prescribed is permitted and encouraged. Never take more pills than instructed. Never take the medication more frequently than prescribed.  Inform other Doctors: Always inform, all of your healthcare providers, of all the medications you take. Pain Medication from other Providers: You are not allowed to accept any additional pain medication from any other Doctor or  Healthcare provider. There are two exceptions to this rule. (see below) In the event that you require additional pain medication, you are responsible for notifying us, as stated below. Cough Medicine: Often these contain an opioid, such as codeine or hydrocodone. Never accept or take cough medicine containing   these opioids if you are already taking an opioid* medication. The combination may cause respiratory failure and death. Medication Agreement: You are responsible for carefully reading and following our Medication Agreement. This must be signed before receiving any prescriptions from our practice. Safely store a copy of your signed Agreement. Violations to the Agreement will result in no further prescriptions. (Additional copies of our Medication Agreement are available upon request.) Laws, Rules, & Regulations: All patients are expected to follow all Federal and State Laws, Statutes, Rules, & Regulations. Ignorance of the Laws does not constitute a valid excuse.  Illegal drugs and Controlled Substances: The use of illegal substances (including, but not limited to marijuana and its derivatives) and/or the illegal use of any controlled substances is strictly prohibited. Violation of this rule may result in the immediate and permanent discontinuation of any and all prescriptions being written by our practice. The use of any illegal substances is prohibited. Adopted CDC guidelines & recommendations: Target dosing levels will be at or below 60 MME/day. Use of benzodiazepines** is not recommended.  Exceptions: There are only two exceptions to the rule of not receiving pain medications from other Healthcare Providers. Exception #1 (Emergencies): In the event of an emergency (i.e.: accident requiring emergency care), you are allowed to receive additional pain medication. However, you are responsible for: As soon as you are able, call our office [phone], at any time of the day or night, and leave a  message stating your name, the date and nature of the emergency, and the name and dose of the medication prescribed. In the event that your call is answered by a member of our staff, make sure to document and save the date, time, and the name of the person that took your information.  Exception #2 (Planned Surgery): In the event that you are scheduled by another doctor or dentist to have any type of surgery or procedure, you are allowed (for a period no longer than 30 days), to receive additional pain medication, for the acute post-op pain. However, in this case, you are responsible for picking up a copy of our "Post-op Pain Management for Surgeons" handout, and giving it to your surgeon or dentist. This document is available at our office, and does not require an appointment to obtain it. Simply go to our office during business hours (Monday-Thursday from 8:00 AM to 4:00 PM) (Friday 8:00 AM to 12:00 Noon) or if you have a scheduled appointment with us, prior to your surgery, and ask for it by name. In addition, you are responsible for: calling our office [phone], at any time of the day or night, and leaving a message stating your name, name of your surgeon, type of surgery, and date of procedure or surgery. Failure to comply with your responsibilities may result in termination of therapy involving the controlled substances. Medication Agreement Violation. Following the above rules, including your responsibilities will help you in avoiding a Medication Agreement Violation ("Breaking your Pain Medication Contract").  Consequences:  Not following the above rules may result in permanent discontinuation of medication prescription therapy.  *Opioid medications include: morphine, codeine, oxycodone, oxymorphone, hydrocodone, hydromorphone, meperidine, tramadol, tapentadol, buprenorphine, fentanyl, methadone. **Benzodiazepine medications include: diazepam (Valium), alprazolam (Xanax), clonazepam (Klonopine),  lorazepam (Ativan), clorazepate (Tranxene), chlordiazepoxide (Librium), estazolam (Prosom), oxazepam (Serax), temazepam (Restoril), triazolam (Halcion) (Last updated: 05/11/2022) ______________________________________________________________________    ______________________________________________________________________  Medication Recommendations and Reminders  Applies to: All patients receiving prescriptions (written and/or electronic).  Medication Rules & Regulations: You are responsible   for reading, knowing, and following our "Medication Rules" document. These exist for your safety and that of others. They are not flexible and neither are we. Dismissing or ignoring them is an act of "non-compliance" that may result in complete and irreversible termination of such medication therapy. For safety reasons, "non-compliance" will not be tolerated. As with the [place] fundamental legal principle of "ignorance of the law is no defense", we will accept no excuses for not having read and knowing the content of documents provided to you by our practice.  Pharmacy of record:  Definition: This is the pharmacy where your electronic prescriptions will be sent.  We do not endorse any particular pharmacy. It is up to you and your insurance to decide what pharmacy to use.  We do not restrict you in your choice of pharmacy. However, once we write for your prescriptions, we will NOT be re-sending more prescriptions to fix restricted supply problems created by your pharmacy, or your insurance.  The pharmacy listed in the electronic medical record should be the one where you want electronic prescriptions to be sent. If you choose to change pharmacy, simply notify our nursing staff. Changes will be made only during your regular appointments and not over the phone.  Recommendations: Keep all of your pain medications in a safe place, under lock and key, even if you live alone. We will NOT replace lost, stolen, or  damaged medication. We do not accept "Police Reports" as proof of medications having been stolen. After you fill your prescription, take 1 week's worth of pills and put them away in a safe place. You should keep a separate, properly labeled bottle for this purpose. The remainder should be kept in the original bottle. Use this as your primary supply, until it runs out. Once it's gone, then you know that you have 1 week's worth of medicine, and it is time to come in for a prescription refill. If you do this correctly, it is unlikely that you will ever run out of medicine. To make sure that the above recommendation works, it is very important that you make sure your medication refill appointments are scheduled at least 1 week before you run out of medicine. To do this in an effective manner, make sure that you do not leave the office without scheduling your next medication management appointment. Always ask the nursing staff to show you in your prescription , when your medication will be running out. Then arrange for the receptionist to get you a return appointment, at least 7 days before you run out of medicine. Do not wait until you have 1 or 2 pills left, to come in. This is very poor planning and does not take into consideration that we may need to cancel appointments due to bad weather, sickness, or emergencies affecting our staff. DO NOT ACCEPT A "Partial Fill": If for any reason your pharmacy does not have enough pills/tablets to completely fill or refill your prescription, do not allow for a "partial fill". The law allows the pharmacy to complete that prescription within 72 hours, without requiring a new prescription. If they do not fill the rest of your prescription within those 72 hours, you will need a separate prescription to fill the remaining amount, which we will NOT provide. If the reason for the partial fill is your insurance, you will need to talk to the pharmacist about payment alternatives for  the remaining tablets, but again, DO NOT ACCEPT A PARTIAL FILL, unless you can trust   your pharmacist to obtain the remainder of the pills within 72 hours.  Prescription refills and/or changes in medication(s):  Prescription refills, and/or changes in dose or medication, will be conducted only during scheduled medication management appointments. (Applies to both, written and electronic prescriptions.) No refills on procedure days. No medication will be changed or started on procedure days. No changes, adjustments, and/or refills will be conducted on a procedure day. Doing so will interfere with the diagnostic portion of the procedure. No phone refills. No medications will be "called into the pharmacy". No Fax refills. No weekend refills. No Holliday refills. No after hours refills.  Remember:  Business hours are:  Monday to Thursday 8:00 AM to 4:00 PM Provider's Schedule: Janyce Ellinger, MD - Appointments are:  Medication management: Monday and Wednesday 8:00 AM to 4:00 PM Procedure day: Tuesday and Thursday 7:30 AM to 4:00 PM Bilal Lateef, MD - Appointments are:  Medication management: Tuesday and Thursday 8:00 AM to 4:00 PM Procedure day: Monday and Wednesday 7:30 AM to 4:00 PM (Last update: 05/11/2022) ______________________________________________________________________    ____________________________________________________________________________________________  National Pain Medication Shortage  The [place] is experiencing worsening drug shortages. These have had a negative widespread effect on patient care and treatment. Not expected to improve any time soon. Predicted to last past 2029.   Drug shortage list (generic names) Oxycodone IR Oxycodone/APAP Oxymorphone IR Hydromorphone Hydrocodone/APAP Morphine  Where is the problem?  Manufacturing and supply level.  Will this shortage affect you?  Only if you take any of the above pain medications.  How? You may be  unable to fill your prescription.  Your pharmacist may offer a "partial fill" of your prescription. (Warning: Do not accept partial fills.) Prescriptions partially filled cannot be transferred to another pharmacy. Read our Medication Rules and Regulation. Depending on how much medicine you are dependent on, you may experience withdrawals when unable to get the medication.  Recommendations: Consider ending your dependence on opioid pain medications. Ask your pain specialist to assist you with the process. Consider switching to a medication currently not in shortage, such as Buprenorphine. Talk to your pain specialist about this option. Consider decreasing your pain medication requirements by managing tolerance thru "Drug Holidays". This may help minimize withdrawals, should you run out of medicine. Control your pain thru the use of non-pharmacological interventional therapies.   Your prescriber: Prescribers cannot be blamed for shortages. Medication manufacturing and supply issues cannot be fixed by the prescriber.   NOTE: The prescriber is not responsible for supplying the medication, or solving supply issues. Work with your pharmacist to solve it. The patient is responsible for the decision to take or continue taking the medication and for identifying and securing a legal supply source. By law, supplying the medication is the job and responsibility of the pharmacy. The prescriber is responsible for the evaluation, monitoring, and prescribing of these medications.   Prescribers will NOT: Re-issue prescriptions that have been partially filled. Re-issue prescriptions already sent to a pharmacy.  Re-send prescriptions to a different pharmacy because yours did not have your medication. Ask pharmacist to order more medicine or transfer the prescription to another pharmacy. (Read below.)  New 2023 regulation: "March 19, 2022 Revised Regulation Allows DEA-Registered Pharmacies to Transfer  Electronic Prescriptions at a Patient's Request DEA Headquarters Division - Public Information Office Patients now have the ability to request their electronic prescription be transferred to another pharmacy without having to go back to their practitioner to initiate the request. This revised regulation went into   effect on Monday, March 15, 2022.     At a patient's request, a DEA-registered retail pharmacy can now transfer an electronic prescription for a controlled substance (schedules II-V) to another DEA-registered retail pharmacy. Prior to this change, patients would have to go through their practitioner to cancel their prescription and have it re-issued to a different pharmacy. The process was taxing and time consuming for both patients and practitioners.    The Drug Enforcement Administration (DEA) published its intent to revise the process for transferring electronic prescriptions on June 06, 2020.  The final rule was published in the federal register on February 11, 2022 and went into effect 30 days later.  Under the final rule, a prescription can only be transferred once between pharmacies, and only if allowed under existing state or other applicable law. The prescription must remain in its electronic form; may not be altered in any way; and the transfer must be communicated directly between two licensed pharmacists. It's important to note, any authorized refills transfer with the original prescription, which means the entire prescription will be filled at the same pharmacy".  Reference: https://www.dea.gov/stories/2023/2023-03/2022-09-01/revised-regulation-allows-dea-registered-pharmacies-transfer (DEA website announcement)  https://www.govinfo.gov/content/pkg/FR-2022-02-11/pdf/2023-15847.pdf (Federal Register  Department of Justice)   Federal Register / Vol. 88, No. 143 / Thursday, February 11, 2022 / Rules and Regulations DEPARTMENT OF JUSTICE  Drug Enforcement Administration  21 CFR Part  1306  [Docket No. [id number]]  RIN 1117-AB64 Transfer of Electronic Prescriptions for Schedules II-V Controlled Substances Between Pharmacies for Initial Filling  ____________________________________________________________________________________________     ____________________________________________________________________________________________  Drug Holidays  What is a "Drug Holiday"? Drug Holiday: is the name given to the process of slowly tapering down and temporarily stopping the pain medication for the purpose of decreasing or eliminating tolerance to the drug.  Benefits Improved effectiveness Decreased required effective dose Improved pain control End dependence on high dose therapy Decrease cost of therapy Uncovering "opioid-induced hyperalgesia". (OIH)  What is "opioid hyperalgesia"? It is a paradoxical increase in pain caused by exposure to opioids. Stopping the opioid pain medication, contrary to the expected, it actually decreases or completely eliminates the pain. Ref.: "A comprehensive review of opioid-induced hyperalgesia". Marion Lee, et.al. Pain Physician. 2011 Mar-Apr;14(2):145-61.  What is tolerance? Tolerance: the progressive loss of effectiveness of a pain medicine due to repetitive use. A common problem of opioid pain medications.  How long should a "Drug Holiday" last? Effectiveness depends on the patient staying off all opioid pain medicines for a minimum of 14 consecutive days. (2 weeks)  How about just taking less of the medicine? Does not work. Will not accomplish goal of eliminating the excess receptors.  How about switching to a different pain medicine? (AKA. "Opioid rotation") Does not work. Creates the illusion of effectiveness by taking advantage of inaccurate equivalent dose calculations between different opioids. -This "technique" was promoted by studies funded by pharmaceutical companies, such as [employer], creators of  "OxyContin".  Can I stop the medicine "cold turkey"? Depends. You should always coordinate with your Pain Specialist to make the transition as smoothly as possible. Avoid stopping the medicine abruptly without consulting. We recommend a "slow taper".  What is a slow taper? Taper: refers to the gradual decrease in dose.   How do I stop/taper the dose? Slowly. Decrease the daily amount of pills that you take by one (1) pill every seven (7) days. This is called a "slow downward taper". Example: if you normally take four (4) pills per day, drop it to three (3) pills per day   for seven (7) days, then to two (2) pills per day for seven (7) days, then to one (1) per day for seven (7) days, and then stop the medicine. The 14 day "Drug Holiday" starts on the first day without medicine.   Will I experience withdrawals? Unlikely with a slow taper.  What triggers withdrawals? Withdrawals are triggered by the sudden/abrupt stop of high dose opioids. Withdrawals can be avoided by slowly decreasing the dose over a prolonged period of time.  What are withdrawals? Symptoms associated with sudden/abrupt reduction/stopping of high-dose, long-term use of pain medication. Withdrawal are seldom seen on low dose therapy, or patients rarely taking opioid medication.  Early Withdrawal Symptoms may include: Agitation Anxiety Muscle aches Increased tearing Insomnia Runny nose Sweating Yawning  Late symptoms may include: Abdominal cramping Diarrhea Dilated pupils Goose bumps Nausea Vomiting  (Last update: 06/27/2022) ____________________________________________________________________________________________    ____________________________________________________________________________________________  WARNING: CBD (cannabidiol) & Delta (Delta-8 tetrahydrocannabinol) products.   Applicable to:  All individuals currently taking or considering taking CBD (cannabidiol) and, more important, all  patients taking opioid analgesic controlled substances (pain medication). (Example: oxycodone; oxymorphone; hydrocodone; hydromorphone; morphine; methadone; tramadol; tapentadol; fentanyl; buprenorphine; butorphanol; dextromethorphan; meperidine; codeine; etc.)  Introduction:  Recently there has been a drive towards the use of "natural" products for the treatment of different conditions, including pain anxiety and sleep disorders. Marijuana and hemp are two varieties of the cannabis genus plants. Marijuana and its derivatives are illegal, while hemp and its derivatives are not. Cannabidiol (CBD) and tetrahydrocannabinol (THC), are two natural compounds found in plants of the Cannabis genus. They can both be extracted from hemp or marijuana. Both compounds interact with your body's endocannabinoid system in very different ways. CBD is associated with pain relief (analgesia) while THC is associated with the psychoactive effects ("the high") obtained from the use of marijuana products. There are two main types of THC: Delta-9, which comes from the marijuana plant and it is illegal, and Delta-8, which comes from the hemp plant, and it is legal. (Both, Delta-9-THC and Delta-8-THC are psychoactive and give you "the high".)   Legality:  Marijuana and its derivatives: illegal Hemp and its derivatives: Legal (State dependent) UPDATE: (09/04/2021) The Drug Enforcement Agency (DEA) issued a letter stating that "delta" cannabinoids, including Delta-8-THCO and Delta-9-THCO, synthetically derived from hemp do not qualify as hemp and will be viewed as Schedule I drugs. (Schedule I drugs, substances, or chemicals are defined as drugs with no currently accepted medical use and a high potential for abuse. Some examples of Schedule I drugs are: heroin, lysergic acid diethylamide (LSD), marijuana (cannabis), 3,4-methylenedioxymethamphetamine (ecstasy), methaqualone, and peyote.) (https://www.dea.gov)  Legal status of CBD in  [place]:  "Conditionally Legal"  Reference: "FDA Regulation of Cannabis and Cannabis-Derived Products, Including Cannabidiol (CBD)" - https://www.fda.gov/news-events/public-health-focus/fda-regulation-cannabis-and-cannabis-derived-products-including-cannabidiol-cbd  Warning:  CBD is not FDA approved and has not undergo the same manufacturing controls as prescription drugs.  This means that the purity and safety of available CBD may be questionable. Most of the time, despite manufacturer's claims, it is contaminated with THC (delta-9-tetrahydrocannabinol - the chemical in marijuana responsible for the "HIGH").  When this is the case, the THC contaminant will trigger a positive urine drug screen (UDS) test for Marijuana (carboxy-THC).   The FDA recently put out a warning about 5 things that everyone should be aware of regarding Delta-8 THC: Delta-8 THC products have not been evaluated or approved by the FDA for safe use and may be marketed in ways that put the public health at   risk. The FDA has received adverse event reports involving delta-8 THC-containing products. Delta-8 THC has psychoactive and intoxicating effects. Delta-8 THC manufacturing often involve use of potentially harmful chemicals to create the concentrations of delta-8 THC claimed in the marketplace. The final delta-8 THC product may have potentially harmful by-products (contaminants) due to the chemicals used in the process. Manufacturing of delta-8 THC products may occur in uncontrolled or unsanitary settings, which may lead to the presence of unsafe contaminants or other potentially harmful substances. Delta-8 THC products should be kept out of the reach of children and pets.  NOTE: Because a positive UDS for any illicit substance is a violation of our medication agreement, your opioid analgesics (pain medicine) may be permanently discontinued.  MORE ABOUT CBD  General Information: CBD was discovered in 1940 and it is a derivative of  the cannabis sativa genus plants (Marijuana and Hemp). It is one of the 113 identified substances found in Marijuana. It accounts for up to 40% of the plant's extract. As of 2018, preliminary clinical studies on CBD included research for the treatment of anxiety, movement disorders, and pain. CBD is available and consumed in multiple forms, including inhalation of smoke or vapor, as an aerosol spray, and by mouth. It may be supplied as an oil containing CBD, capsules, dried cannabis, or as a liquid solution. CBD is thought not to be as psychoactive as THC (delta-9-tetrahydrocannabinol - the chemical in marijuana responsible for the "HIGH"). Studies suggest that CBD may interact with different biological target receptors in the body, including cannabinoid and other neurotransmitter receptors. As of 2018 the mechanism of action for its biological effects has not been determined.  Side-effects  Adverse reactions: Dry mouth, diarrhea, decreased appetite, fatigue, drowsiness, malaise, weakness, sleep disturbances, and others.  Drug interactions:  CBD may interact with medications such as blood-thinners. CBD causes drowsiness on its own and it will increase drowsiness caused by other medications, including antihistamines (such as Benadryl), benzodiazepines (Xanax, Ativan, Valium), antipsychotics, antidepressants, opioids, alcohol and supplements such as kava, melatonin and St. John's Wort.  Other drug interactions: Brivaracetam (Briviact); Caffeine; Carbamazepine (Tegretol); Citalopram (Celexa); Clobazam (Onfi); Eslicarbazepine (Aptiom); Everolimus (Zostress); Lithium; Methadone (Dolophine); Rufinamide (Banzel); Sedative medications (CNS depressants); Sirolimus (Rapamune); Stiripentol (Diacomit); Tacrolimus (Prograf); Tamoxifen ; Soltamox); Topiramate (Topamax); Valproate; Warfarin (Coumadin); Zonisamide. (Last update:  06/28/2022) ____________________________________________________________________________________________   ____________________________________________________________________________________________  Naloxone Nasal Spray  Why am I receiving this medication? [place] STOP ACT requires that all patients taking high dose opioids or at risk of opioids respiratory depression, be prescribed an opioid reversal agent, such as Naloxone (AKA: Narcan).  What is this medication? NALOXONE (nal OX one) treats opioid overdose, which causes slow or shallow breathing, severe drowsiness, or trouble staying awake. Call emergency services after using this medication. You may need additional treatment. Naloxone works by reversing the effects of opioids. It belongs to a group of medications called opioid blockers.  COMMON BRAND NAME(S): Kloxxado, Narcan  What should I tell my care team before I take this medication? They need to know if you have any of these conditions: Heart disease Substance use disorder An unusual or allergic reaction to naloxone, other medications, foods, dyes, or preservatives Pregnant or trying to get pregnant Breast-feeding  When to use this medication? This medication is to be used for the treatment of respiratory depression (less than 8 breaths per minute) secondary to opioid overdose.   How to use this medication? This medication is for use in the nose. Lay the person on their   back. Support their neck with your hand and allow the head to tilt back before giving the medication. The nasal spray should be given into 1 nostril. After giving the medication, move the person onto their side. Do not remove or test the nasal spray until ready to use. Get emergency medical help right away after giving the first dose of this medication, even if the person wakes up. You should be familiar with how to recognize the signs and symptoms of a narcotic overdose. If more doses are needed, give  the additional dose in the other nostril. Talk to your care team about the use of this medication in children. While this medication may be prescribed for children as young as newborns for selected conditions, precautions do apply.  Naloxone Overdosage: If you think you have taken too much of this medicine contact a poison control center or emergency room at once.  NOTE: This medicine is only for you. Do not share this medicine with others.  What if I miss a dose? This does not apply.  What may interact with this medication? This is only used during an emergency. No interactions are expected during emergency use. This list may not describe all possible interactions. Give your health care provider a list of all the medicines, herbs, non-prescription drugs, or dietary supplements you use. Also tell them if you smoke, drink alcohol, or use illegal drugs. Some items may interact with your medicine.  What should I watch for while using this medication? Keep this medication ready for use in the case of an opioid overdose. Make sure that you have the phone number of your care team and local hospital ready. You may need to have additional doses of this medication. Each nasal spray contains a single dose. Some emergencies may require additional doses. After use, bring the treated person to the nearest hospital or call 911. Make sure the treating care team knows that the person has received a dose of this medication. You will receive additional instructions on what to do during and after use of this medication before an emergency occurs.  What side effects may I notice from receiving this medication? Side effects that you should report to your care team as soon as possible: Allergic reactions--skin rash, itching, hives, swelling of the face, lips, tongue, or throat Side effects that usually do not require medical attention (report these to your care team if they continue or are  bothersome): Constipation Dryness or irritation inside the nose Headache Increase in blood pressure Muscle spasms Stuffy nose Toothache This list may not describe all possible side effects. Call your doctor for medical advice about side effects. You may report side effects to FDA at 1-800-FDA-1088.  Where should I keep my medication? Because this is an emergency medication, you should keep it with you at all times.  Keep out of the reach of children and pets. Store between 20 and 25 degrees C (68 and 77 degrees F). Do not freeze. Throw away any unused medication after the expiration date. Keep in original box until ready to use.  NOTE: This sheet is a summary. It may not cover all possible information. If you have questions about this medicine, talk to your doctor, pharmacist, or health care provider.   2023 Elsevier/Gold Standard (2021-03-13 00:00:00)  ____________________________________________________________________________________________   

## 2022-09-01 IMAGING — CR DG HIP (WITH OR WITHOUT PELVIS) 2-3V*L*
1 series · 3 of 3 positions shown · non-contrast
Comparison: 12/27/2016

CLINICAL DATA: Chronic left hip pain

EXAM:
DG HIP (WITH OR WITHOUT PELVIS) 2-3V LEFT

[Series 1: dg hip unilat w or w/o pelvis 2-3 views  · non-contrast · 0.14mm/px · 3 of 3 slices shown]
[im 1/3]
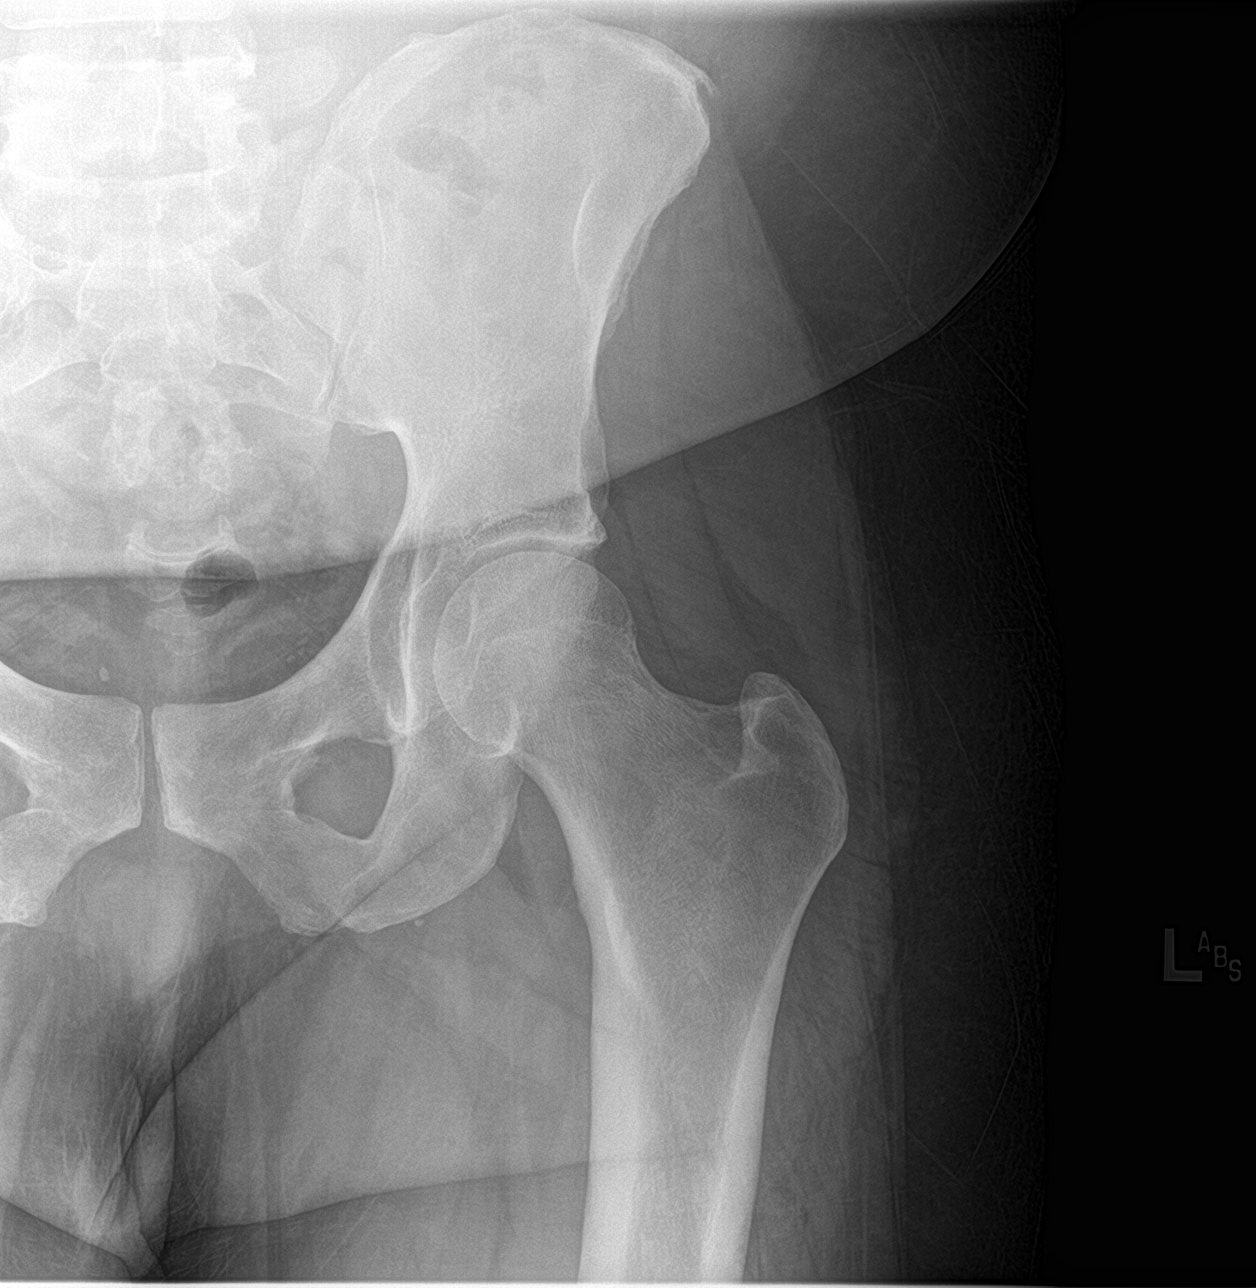
[im 2/3]
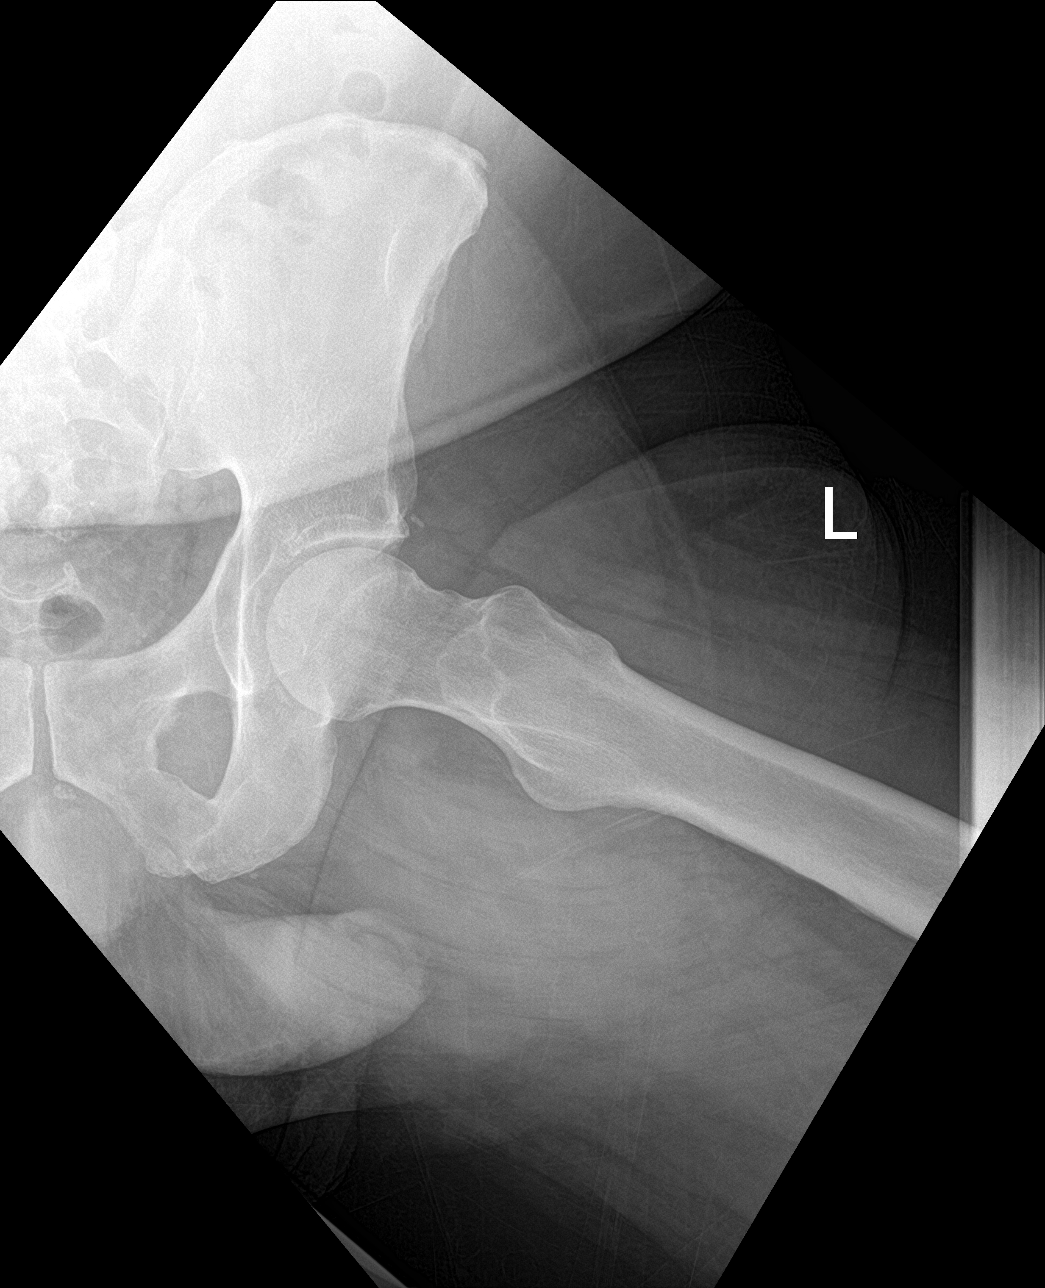
[im 3/3]
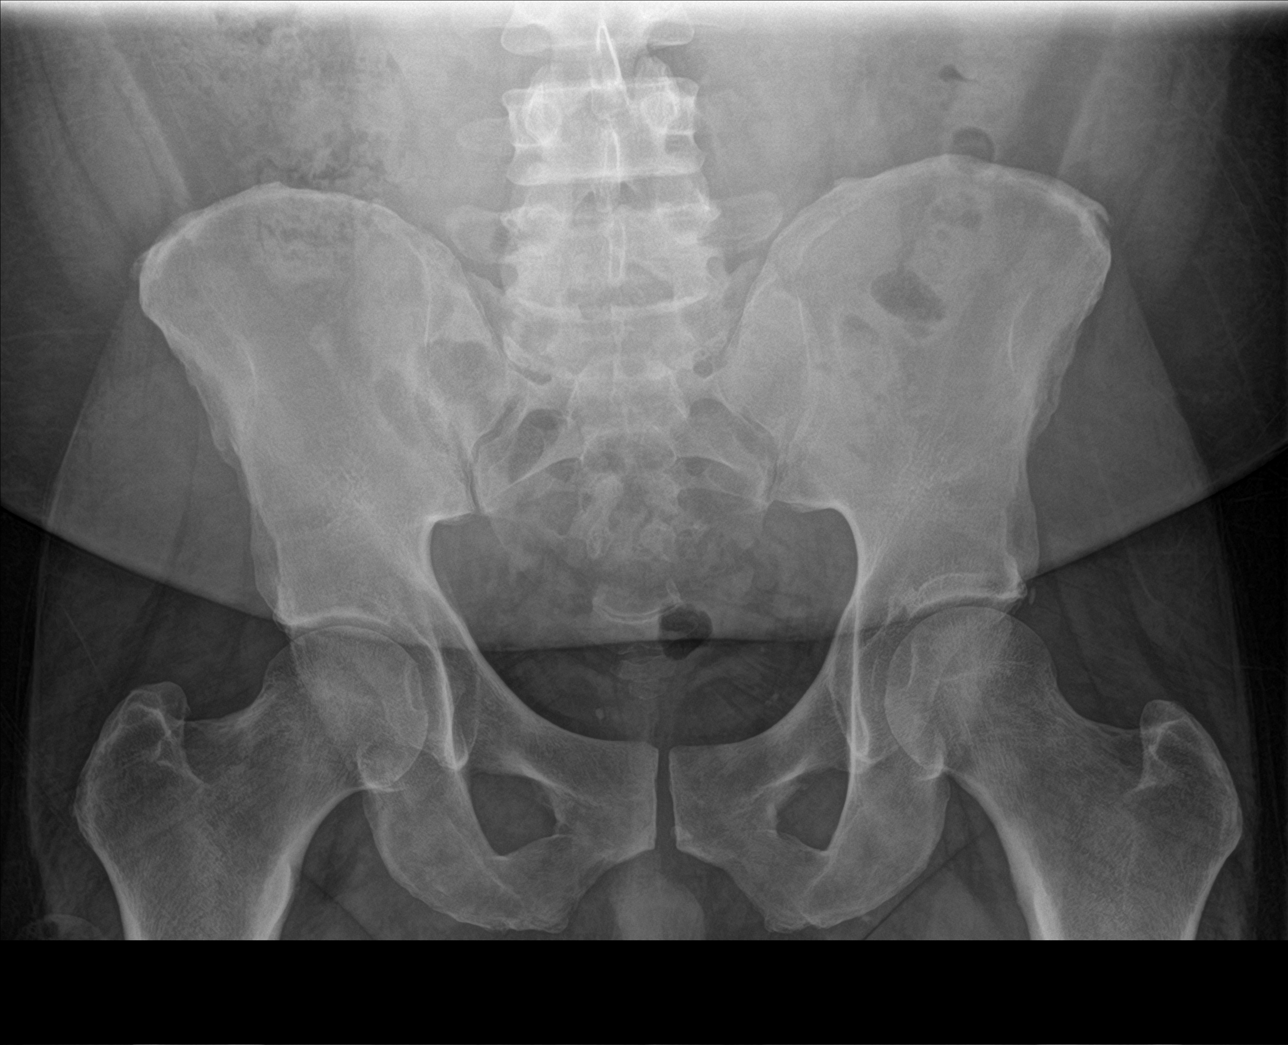

[3 of 3 positions shown; findings below may reference images not displayed]

FINDINGS: There is no evidence of hip fracture or dislocation. There is no
evidence of arthropathy or other focal bone abnormality.
IMPRESSION: Negative.

## 2022-09-01 IMAGING — CR DG HIP (WITH OR WITHOUT PELVIS) 2-3V*R*
1 series · 3 of 3 positions shown · non-contrast
Comparison: April 11, 2015

CLINICAL DATA: Right hip pain.

EXAM:
DG HIP (WITH OR WITHOUT PELVIS) 2-3V RIGHT

[Series 1: dg hip unilat w or w/o pelvis 2-3 views  · non-contrast · 0.14mm/px · 3 of 3 slices shown]
[im 1/3]
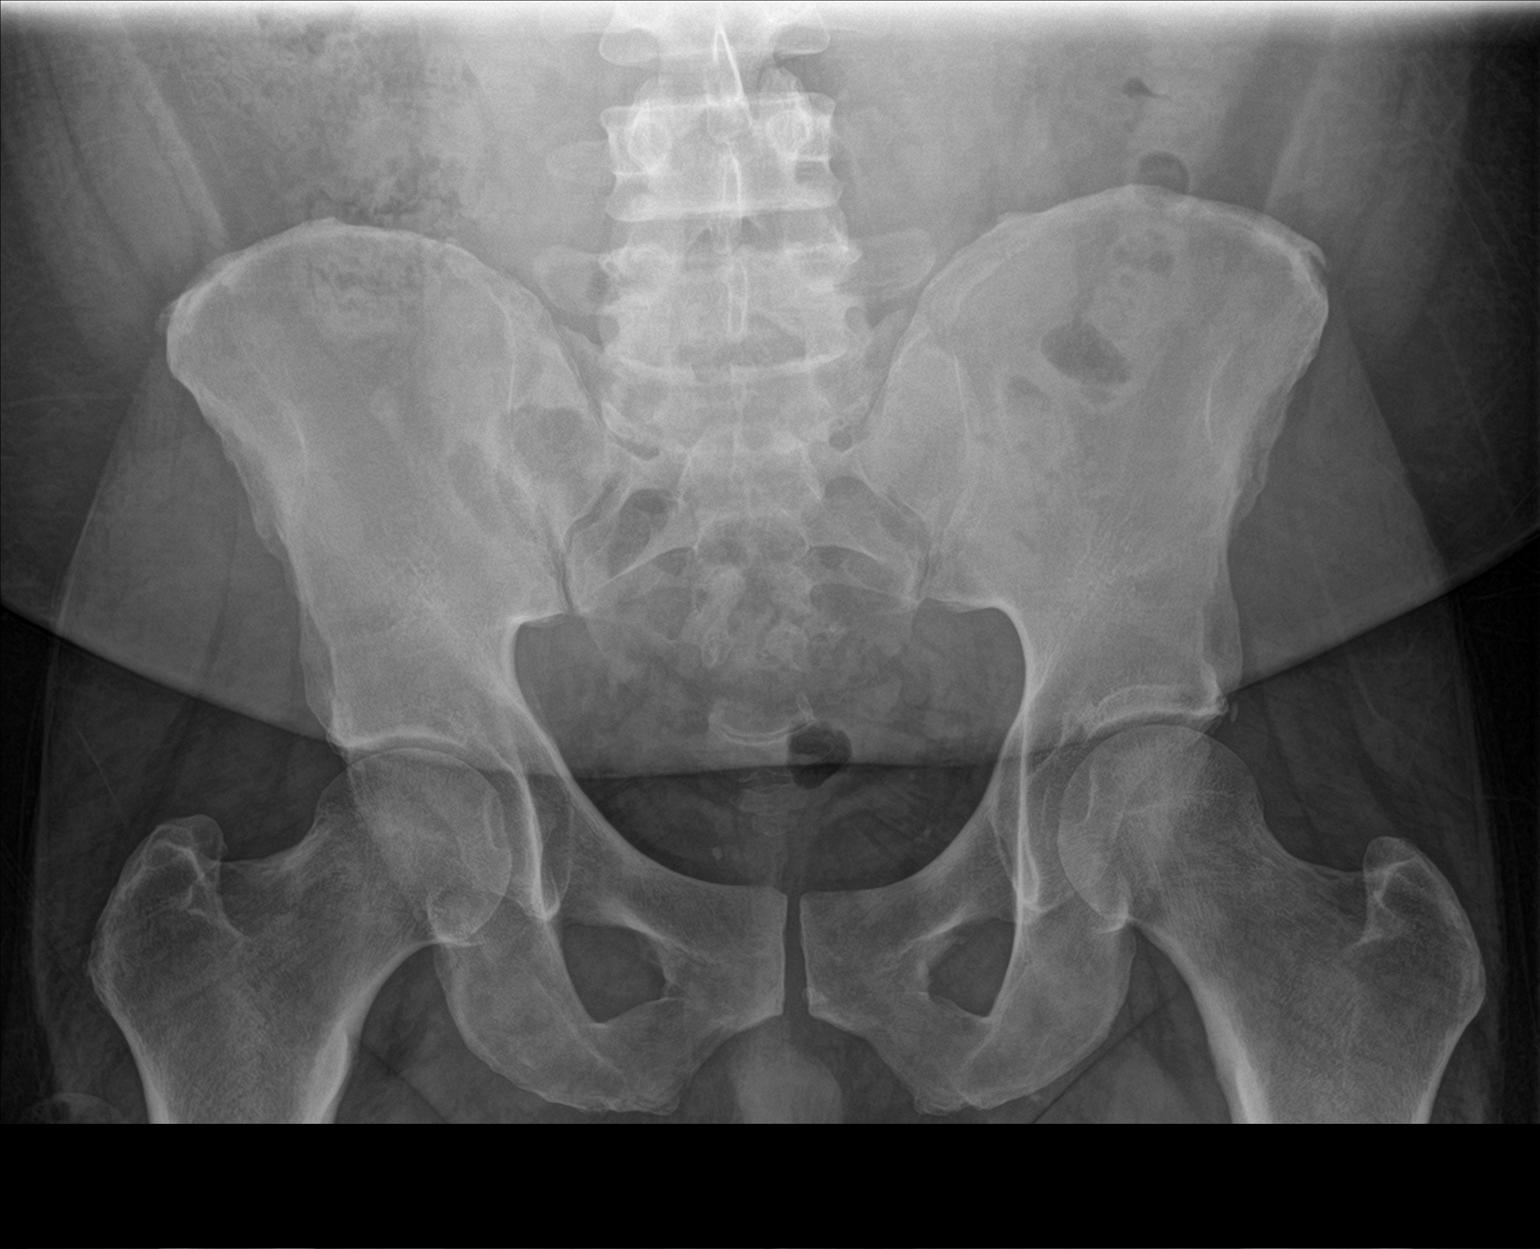
[im 2/3]
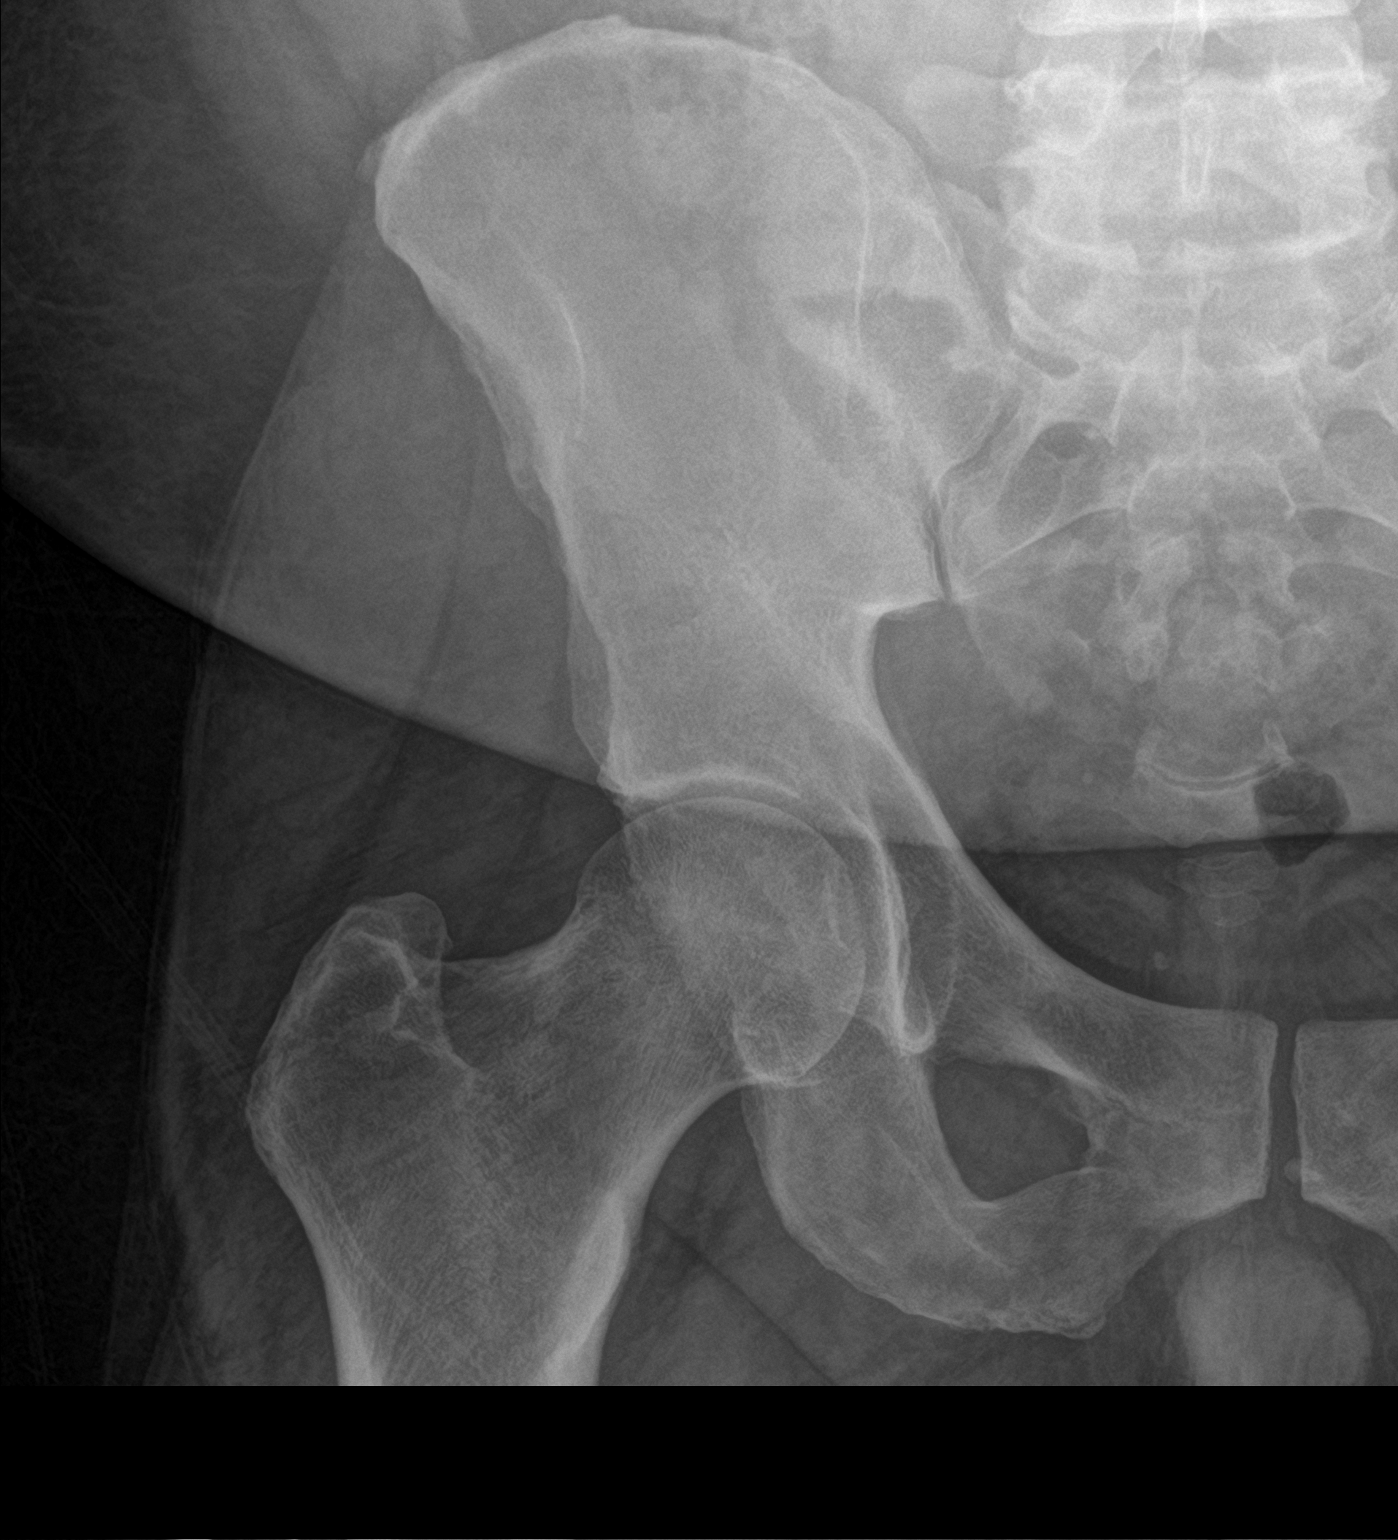
[im 3/3]
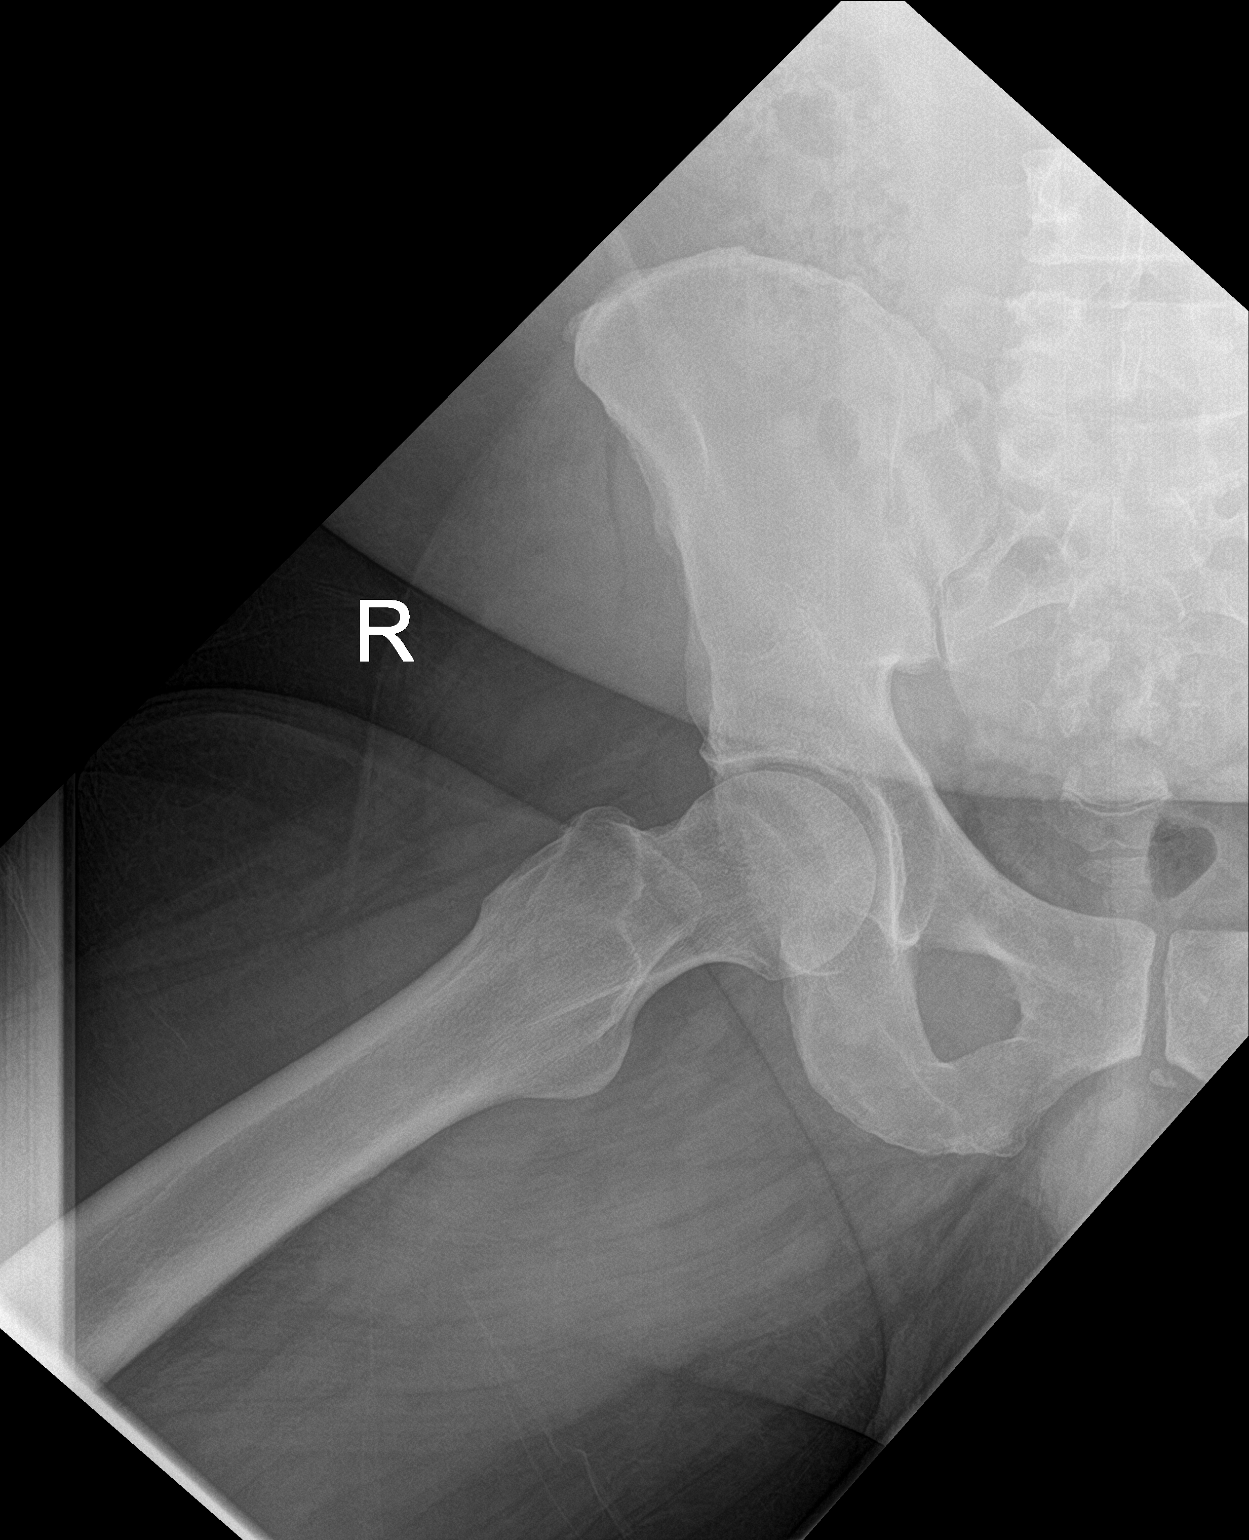

[3 of 3 positions shown; findings below may reference images not displayed]

FINDINGS: There is no evidence of hip fracture or dislocation. There is no
evidence of arthropathy or other focal bone abnormality.
IMPRESSION: Negative.

## 2022-10-26 NOTE — Patient Instructions (Signed)
____________________________________________________________________________________________  Opioid Pain Medication Update  To: All patients taking opioid pain medications. (I.e.: hydrocodone, hydromorphone, oxycodone, oxymorphone, morphine, codeine, methadone, tapentadol, tramadol, buprenorphine, fentanyl, etc.)  Re: Updated review of side effects and adverse reactions of opioid analgesics, as well as new information about long term effects of this class of medications.  Direct risks of long-term opioid therapy are not limited to opioid addiction and overdose. Potential medical risks include serious fractures, breathing problems during sleep, hyperalgesia, immunosuppression, chronic constipation, bowel obstruction, myocardial infarction, and tooth decay secondary to xerostomia.  Unpredictable adverse effects that can occur even if you take your medication correctly: Cognitive impairment, respiratory depression, and death. Most people think that if they take their medication "correctly", and "as instructed", that they will be safe. Nothing could be farther from the truth. In reality, a significant amount of recorded deaths associated with the use of opioids has occurred in individuals that had taken the medication for a long time, and were taking their medication correctly. The following are examples of how this can happen: Patient taking his/her medication for a long time, as instructed, without any side effects, is given a certain antibiotic or another unrelated medication, which in turn triggers a "Drug-to-drug interaction" leading to disorientation, cognitive impairment, impaired reflexes, respiratory depression or an untoward event leading to serious bodily harm or injury, including death.  Patient taking his/her medication for a long time, as instructed, without any side effects, develops an acute impairment of liver and/or kidney function. This will lead to a rapid inability of the body to  breakdown and eliminate their pain medication, which will result in effects similar to an "overdose", but with the same medicine and dose that they had always taken. This again may lead to disorientation, cognitive impairment, impaired reflexes, respiratory depression or an untoward event leading to serious bodily harm or injury, including death.  A similar problem will occur with patients as they grow older and their liver and kidney function begins to decrease as part of the aging process.  Background information: Historically, the original case for using long-term opioid therapy to treat chronic noncancer pain was based on safety assumptions that subsequent experience has called into question. In 1996, the American Pain Society and the American Academy of Pain Medicine issued a consensus statement supporting long-term opioid therapy. This statement acknowledged the dangers of opioid prescribing but concluded that the risk for addiction was low; respiratory depression induced by opioids was short-lived, occurred mainly in opioid-naive patients, and was antagonized by pain; tolerance was not a common problem; and efforts to control diversion should not constrain opioid prescribing. This has now proven to be wrong. Experience regarding the risks for opioid addiction, misuse, and overdose in community practice has failed to support these assumptions.  According to the Centers for Disease Control and Prevention, fatal overdoses involving opioid analgesics have increased sharply over the past decade. Currently, more than 96,700 people die from drug overdoses every year. Opioids are a factor in 7 out of every 10 overdose deaths. Deaths from drug overdose have surpassed motor vehicle accidents as the leading cause of death for individuals between the ages of 35 and 54.  Clinical data suggest that neuroendocrine dysfunction may be very common in both men and women, potentially causing hypogonadism, erectile  dysfunction, infertility, decreased libido, osteoporosis, and depression. Recent studies linked higher opioid dose to increased opioid-related mortality. Controlled observational studies reported that long-term opioid therapy may be associated with increased risk for cardiovascular events. Subsequent meta-analysis concluded   that the safety of long-term opioid therapy in elderly patients has not been proven.   Side Effects and adverse reactions: Common side effects: Drowsiness (sedation). Dizziness. Nausea and vomiting. Constipation. Physical dependence -- Dependence often manifests with withdrawal symptoms when opioids are discontinued or decreased. Tolerance -- As you take repeated doses of opioids, you require increased medication to experience the same effect of pain relief. Respiratory depression -- This can occur in healthy people, especially with higher doses. However, people with COPD, asthma or other lung conditions may be even more susceptible to fatal respiratory impairment.  Uncommon side effects: An increased sensitivity to feeling pain and extreme response to pain (hyperalgesia). Chronic use of opioids can lead to this. Delayed gastric emptying (the process by which the contents of your stomach are moved into your small intestine). Muscle rigidity. Immune system and hormonal dysfunction. Quick, involuntary muscle jerks (myoclonus). Arrhythmia. Itchy skin (pruritus). Dry mouth (xerostomia).  Long-term side effects: Chronic constipation. Sleep-disordered breathing (SDB). Increased risk of bone fractures. Hypothalamic-pituitary-adrenal dysregulation. Increased risk of overdose.  RISKS: Fractures and Falls:  Opioids increase the risk and incidence of falls. This is of particular importance in elderly patients.  Endocrine System:  Long-term administration is associated with endocrine abnormalities (endocrinopathies). (Also known as Opioid-induced Endocrinopathy) Influences  on both the hypothalamic-pituitary-adrenal axis?and the hypothalamic-pituitary-gonadal axis have been demonstrated with consequent hypogonadism and adrenal insufficiency in both sexes. Hypogonadism and decreased levels of dehydroepiandrosterone sulfate have been reported in men and women. Endocrine effects include: Amenorrhoea in women (abnormal absence of menstruation) Reduced libido in both sexes Decreased sexual function Erectile dysfunction in men Hypogonadisms (decreased testicular function with shrinkage of testicles) Infertility Depression and fatigue Loss of muscle mass Anxiety Depression Immune suppression Hyperalgesia Weight gain Anemia Osteoporosis Patients (particularly women of childbearing age) should avoid opioids. There is insufficient evidence to recommend routine monitoring of asymptomatic patients taking opioids in the long-term for hormonal deficiencies.  Immune System: Human studies have demonstrated that opioids have an immunomodulating effect. These effects are mediated via opioid receptors both on immune effector cells and in the central nervous system. Opioids have been demonstrated to have adverse effects on antimicrobial response and anti-tumour surveillance. Buprenorphine has been demonstrated to have no impact on immune function.  Opioid Induced Hyperalgesia: Human studies have demonstrated that prolonged use of opioids can lead to a state of abnormal pain sensitivity, sometimes called opioid induced hyperalgesia (OIH). Opioid induced hyperalgesia is not usually seen in the absence of tolerance to opioid analgesia. Clinically, hyperalgesia may be diagnosed if the patient on long-term opioid therapy presents with increased pain. This might be qualitatively and anatomically distinct from pain related to disease progression or to breakthrough pain resulting from development of opioid tolerance. Pain associated with hyperalgesia tends to be more diffuse than the  pre-existing pain and less defined in quality. Management of opioid induced hyperalgesia requires opioid dose reduction.  Cancer: Chronic opioid therapy has been associated with an increased risk of cancer among noncancer patients with chronic pain. This association was more evident in chronic strong opioid users. Chronic opioid consumption causes significant pathological changes in the small intestine and colon. Epidemiological studies have found that there is a link between opium dependence and initiation of gastrointestinal cancers. Cancer is the second leading cause of death after cardiovascular disease. Chronic use of opioids can cause multiple conditions such as GERD, immunosuppression and renal damage as well as carcinogenic effects, which are associated with the incidence of cancers.   Mortality: Long-term opioid use   has been associated with increased mortality among patients with chronic non-cancer pain (CNCP).  Prescription of long-acting opioids for chronic noncancer pain was associated with a significantly increased risk of all-cause mortality, including deaths from causes other than overdose.  Reference: Von Korff M, Kolodny A, Deyo RA, Chou R. Long-term opioid therapy reconsidered. Ann Intern Med. 2011 Sep 6;155(5):325-8. doi: 10.7326/[id number]. PMID: 21893626; PMCID: PMC3280085. Bedson J, Chen Y, Ashworth J, Hayward RA, Dunn KM, Jordan KP. Risk of adverse events in patients prescribed long-term opioids: A cohort study in the [place] Clinical Practice Research Datalink. Eur J Pain. 2019 May;23(5):908-922. doi: 10.1002/ejp.1357. Epub 2019 Jan 31. PMID: 30620116. Colameco S, Coren JS, Ciervo CA. Continuous opioid treatment for chronic noncancer pain: a time for moderation in prescribing. Postgrad Med. 2009 Jul;121(4):61-6. doi: 10.3810/pgm.2009.07.2032. PMID: 19641271. Chou R, Turner JA, Devine EB, Hansen RN, Sullivan SD, Blazina I, Dana T, Bougatsos C, Deyo RA. The  effectiveness and risks of long-term opioid therapy for chronic pain: a systematic review for a National Institutes of Health Pathways to Prevention Workshop. Ann Intern Med. 2015 Feb 17;162(4):276-86. doi: 10.7326/M14-2559. PMID: 25581257. Warner M, Chen LH, Makuc DM. NCHS Data Brief No. 22. [place]: Centers for Disease Control and Prevention; 2009. Sep, Increase in Fatal Poisonings Involving Opioid Analgesics in the [place], 1999-2006. Song IA, Choi HR, Oh TK. Long-term opioid use and mortality in patients with chronic non-cancer pain: Ten-year follow-up study in [place] from 2010 through 2019. EClinicalMedicine. 2022 Jul 18;51:101558. doi: 10.1016/j.eclinm.2022.101558. PMID: 35875817; PMCID: PMC9304910. Huser, W., Schubert, T., Vogelmann, T. et al. All-cause mortality in patients with long-term opioid therapy compared with non-opioid analgesics for chronic non-cancer pain: a database study. BMC Med 18, 162 (2020). https://doi.org/10.1186/[email or web address] Rashidian H, Zendehdel K, Kamangar F, Malekzadeh R, Haghdoost AA. An Ecological Study of the Association between Opiate Use and Incidence of Cancers. Addict Health. 2016 Fall;8(4):252-260. PMID: 28819556; PMCID: PMC5554805.  Our Goal: Our goal is to control your pain with means other than the use of opioid pain medications.  Our Recommendation: Talk to your physician about coming off of these medications. We can assist you with the tapering down and stopping these medicines. Based on the new information, even if you cannot completely stop the medication, a decrease in the dose may be associated with a lesser risk. Ask for other means of controlling the pain. Decrease or eliminate those factors that significantly contribute to your pain such as smoking, obesity, and a diet heavily tilted towards "inflammatory" nutrients.  Last Updated: 09/15/2022    ____________________________________________________________________________________________     ____________________________________________________________________________________________  Patient Information update  To: All of our patients.  Re: Name change.  It has been made official that our current name, "[facility]"   will soon be changed to "[facility]".   The purpose of this change is to eliminate any confusion created by the concept of our practice being a "Medication Management Pain Clinic". In the past this has led to the misconception that we treat pain primarily by the use of prescription medications.  Nothing can be farther from the truth.   Understanding PAIN MANAGEMENT: To further understand what our practice does, you first have to understand that "Pain Management" is a subspecialty that requires additional training once a physician has completed their specialty training, which can be in either Anesthesia, Neurology, Psychiatry, or Physical Medicine and Rehabilitation (PMR). Each one of these contributes to the final approach taken by each physician to   the management of their patient's pain. To be a "Pain Management Specialist" you must have first completed one of the specialty trainings below.  Anesthesiologists - trained in clinical pharmacology and interventional techniques such as nerve blockade and regional as well as central neuroanatomy. They are trained to block pain before, during, and after surgical interventions.  Neurologists - trained in the diagnosis and pharmacological treatment of complex neurological conditions, such as Multiple Sclerosis, Parkinson's, spinal cord injuries, and other systemic conditions that may be associated with symptoms that may include but are not limited to pain. They tend to rely primarily on the treatment of chronic pain  using prescription medications.  Psychiatrist - trained in conditions affecting the psychosocial wellbeing of patients including but not limited to depression, anxiety, schizophrenia, personality disorders, addiction, and other substance use disorders that may be associated with chronic pain. They tend to rely primarily on the treatment of chronic pain using prescription medications.   Physical Medicine and Rehabilitation (PMR) physicians, also known as physiatrists - trained to treat a wide variety of medical conditions affecting the brain, spinal cord, nerves, bones, joints, ligaments, muscles, and tendons. Their training is primarily aimed at treating patients that have suffered injuries that have caused severe physical impairment. Their training is primarily aimed at the physical therapy and rehabilitation of those patients. They may also work alongside orthopedic surgeons or neurosurgeons using their expertise in assisting surgical patients to recover after their surgeries.  INTERVENTIONAL PAIN MANAGEMENT is sub-subspecialty of Pain Management.  Our physicians are Board-certified in Anesthesia, Pain Management, and Interventional Pain Management.  This meaning that not only have they been trained and Board-certified in their specialty of Anesthesia, and subspecialty of Pain Management, but they have also received further training in the sub-subspecialty of Interventional Pain Management, in order to become Board-certified as INTERVENTIONAL PAIN MANAGEMENT SPECIALIST.    Mission: Our goal is to use our skills in  INTERVENTIONAL PAIN MANAGEMENT as alternatives to the chronic use of prescription opioid medications for the treatment of pain. To make this more clear, we have changed our name to reflect what we do and offer. We will continue to offer medication management assessment and recommendations, but we will not be taking over any patient's medication  management.  ____________________________________________________________________________________________     ____________________________________________________________________________________________  National Pain Medication Shortage  The [place] is experiencing worsening drug shortages. These have had a negative widespread effect on patient care and treatment. Not expected to improve any time soon. Predicted to last past 2029.   Drug shortage list (generic names) Oxycodone IR Oxycodone/APAP Oxymorphone IR Hydromorphone Hydrocodone/APAP Morphine  Where is the problem?  Manufacturing and supply level.  Will this shortage affect you?  Only if you take any of the above pain medications.  How? You may be unable to fill your prescription.  Your pharmacist may offer a "partial fill" of your prescription. (Warning: Do not accept partial fills.) Prescriptions partially filled cannot be transferred to another pharmacy. Read our Medication Rules and Regulation. Depending on how much medicine you are dependent on, you may experience withdrawals when unable to get the medication.  Recommendations: Consider ending your dependence on opioid pain medications. Ask your pain specialist to assist you with the process. Consider switching to a medication currently not in shortage, such as Buprenorphine. Talk to your pain specialist about this option. Consider decreasing your pain medication requirements by managing tolerance thru "Drug Holidays". This may help minimize withdrawals, should you run out of medicine. Control your pain thru   the use of non-pharmacological interventional therapies.   Your prescriber: Prescribers cannot be blamed for shortages. Medication manufacturing and supply issues cannot be fixed by the prescriber.   NOTE: The prescriber is not responsible for supplying the medication, or solving supply issues. Work with your pharmacist to solve it. The patient is responsible for  the decision to take or continue taking the medication and for identifying and securing a legal supply source. By law, supplying the medication is the job and responsibility of the pharmacy. The prescriber is responsible for the evaluation, monitoring, and prescribing of these medications.   Prescribers will NOT: Re-issue prescriptions that have been partially filled. Re-issue prescriptions already sent to a pharmacy.  Re-send prescriptions to a different pharmacy because yours did not have your medication. Ask pharmacist to order more medicine or transfer the prescription to another pharmacy. (Read below.)  New 2023 regulation: "March 19, 2022 Revised Regulation Allows DEA-Registered Pharmacies to Transfer Electronic Prescriptions at a Patient's Request DEA Headquarters Division - Public Information Office Patients now have the ability to request their electronic prescription be transferred to another pharmacy without having to go back to their practitioner to initiate the request. This revised regulation went into effect on Monday, March 15, 2022.     At a patient's request, a DEA-registered retail pharmacy can now transfer an electronic prescription for a controlled substance (schedules II-V) to another DEA-registered retail pharmacy. Prior to this change, patients would have to go through their practitioner to cancel their prescription and have it re-issued to a different pharmacy. The process was taxing and time consuming for both patients and practitioners.    The Drug Enforcement Administration (DEA) published its intent to revise the process for transferring electronic prescriptions on June 06, 2020.  The final rule was published in the federal register on February 11, 2022 and went into effect 30 days later.  Under the final rule, a prescription can only be transferred once between pharmacies, and only if allowed under existing state or other applicable law. The prescription must  remain in its electronic form; may not be altered in any way; and the transfer must be communicated directly between two licensed pharmacists. It's important to note, any authorized refills transfer with the original prescription, which means the entire prescription will be filled at the same pharmacy".  Reference: https://www.dea.gov/stories/2023/2023-03/2022-09-01/revised-regulation-allows-dea-registered-pharmacies-transfer (DEA website announcement)  https://www.govinfo.gov/content/pkg/FR-2022-02-11/pdf/2023-15847.pdf (Federal Register  Department of Justice)   Federal Register / Vol. 88, No. 143 / Thursday, February 11, 2022 / Rules and Regulations DEPARTMENT OF JUSTICE  Drug Enforcement Administration  21 CFR Part 1306  [Docket No. [id number]]  RIN [id number]-AB64 Transfer of Electronic Prescriptions for Schedules II-V Controlled Substances Between Pharmacies for Initial Filling  ____________________________________________________________________________________________     ____________________________________________________________________________________________  Transfer of Pain Medication between Pharmacies  Re: 2023 DEA Clarification on existing regulation  Published on DEA Website: March 19, 2022  Title: Revised Regulation Allows DEA-Registered Pharmacies to Transfer Electronic Prescriptions at a Patient's Request DEA Headquarters Division - Public Information Office  "Patients now have the ability to request their electronic prescription be transferred to another pharmacy without having to go back to their practitioner to initiate the request. This revised regulation went into effect on Monday, March 15, 2022.     At a patient's request, a DEA-registered retail pharmacy can now transfer an electronic prescription for a controlled substance (schedules II-V) to another DEA-registered retail pharmacy. Prior to this change, patients would have to go through their practitioner to  cancel their prescription   and have it re-issued to a different pharmacy. The process was taxing and time consuming for both patients and practitioners.    The Drug Enforcement Administration (DEA) published its intent to revise the process for transferring electronic prescriptions on June 06, 2020.  The final rule was published in the federal register on February 11, 2022 and went into effect 30 days later.  Under the final rule, a prescription can only be transferred once between pharmacies, and only if allowed under existing state or other applicable law. The prescription must remain in its electronic form; may not be altered in any way; and the transfer must be communicated directly between two licensed pharmacists. It's important to note, any authorized refills transfer with the original prescription, which means the entire prescription will be filled at the same pharmacy."    REFERENCES: 1. DEA website announcement https://www.dea.gov/stories/2023/2023-03/2022-09-01/revised-regulation-allows-dea-registered-pharmacies-transfer  2. Department of Justice website  https://www.govinfo.gov/content/pkg/FR-2022-02-11/pdf/2023-15847.pdf  3. DEPARTMENT OF JUSTICE Drug Enforcement Administration 21 CFR Part 1306 [Docket No. [id number]] RIN [id number] "Transfer of Electronic Prescriptions for Schedules II-V Controlled Substances Between Pharmacies for Initial Filling"  ____________________________________________________________________________________________     _______________________________________________________________________  Medication Rules  Purpose: To inform patients, and their family members, of our medication rules and regulations.  Applies to: All patients receiving prescriptions from our practice (written or electronic).  Pharmacy of record: This is the pharmacy where your electronic prescriptions will be sent. Make sure we have the correct one.  Electronic prescriptions: In  compliance with the [place] Strengthen Opioid Misuse Prevention (STOP) Act of 2017 (Session Law 2017-74/H243), effective July 19, 2018, all controlled substances must be electronically prescribed. Written prescriptions, faxing, or calling prescriptions to a pharmacy will no longer be done.  Prescription refills: These will be provided only during in-person appointments. No medications will be renewed without a "face-to-face" evaluation with your provider. Applies to all prescriptions.  NOTE: The following applies primarily to controlled substances (Opioid* Pain Medications).   Type of encounter (visit): For patients receiving controlled substances, face-to-face visits are required. (Not an option and not up to the patient.)  Patient's responsibilities: Pain Pills: Bring all pain pills to every appointment (except for procedure appointments). Pill Bottles: Bring pills in original pharmacy bottle. Bring bottle, even if empty. Always bring the bottle of the most recent fill.  Medication refills: You are responsible for knowing and keeping track of what medications you are taking and when is it that you will need a refill. The day before your appointment: write a list of all prescriptions that need to be refilled. The day of the appointment: give the list to the admitting nurse. Prescriptions will be written only during appointments. No prescriptions will be written on procedure days. If you forget a medication: it will not be "Called in", "Faxed", or "electronically sent". You will need to get another appointment to get these prescribed. No early refills. Do not call asking to have your prescription filled early. Partial  or short prescriptions: Occasionally your pharmacy may not have enough pills to fill your prescription.  NEVER ACCEPT a partial fill or a prescription that is short of the total amount of pills that you were prescribed.  With controlled substances the law allows 72 hours for  the pharmacy to complete the prescription.  If the prescription is not completed within 72 hours, the pharmacist will require a new prescription to be written. This means that you will be short on your medicine and we WILL NOT send another prescription to complete your original   prescription.  Instead, request the pharmacy to send a carrier to a nearby branch to get enough medication to provide you with your full prescription. Prescription Accuracy: You are responsible for carefully inspecting your prescriptions before leaving our office. Have the discharge nurse carefully go over each prescription with you, before taking them home. Make sure that your name is accurately spelled, that your address is correct. Check the name and dose of your medication to make sure it is accurate. Check the number of pills, and the written instructions to make sure they are clear and accurate. Make sure that you are given enough medication to last until your next medication refill appointment. Taking Medication: Take medication as prescribed. When it comes to controlled substances, taking less pills or less frequently than prescribed is permitted and encouraged. Never take more pills than instructed. Never take the medication more frequently than prescribed.  Inform other Doctors: Always inform, all of your healthcare providers, of all the medications you take. Pain Medication from other Providers: You are not allowed to accept any additional pain medication from any other Doctor or Healthcare provider. There are two exceptions to this rule. (see below) In the event that you require additional pain medication, you are responsible for notifying us, as stated below. Cough Medicine: Often these contain an opioid, such as codeine or hydrocodone. Never accept or take cough medicine containing these opioids if you are already taking an opioid* medication. The combination may cause respiratory failure and death. Medication Agreement:  You are responsible for carefully reading and following our Medication Agreement. This must be signed before receiving any prescriptions from our practice. Safely store a copy of your signed Agreement. Violations to the Agreement will result in no further prescriptions. (Additional copies of our Medication Agreement are available upon request.) Laws, Rules, & Regulations: All patients are expected to follow all Federal and State Laws, Statutes, Rules, & Regulations. Ignorance of the Laws does not constitute a valid excuse.  Illegal drugs and Controlled Substances: The use of illegal substances (including, but not limited to marijuana and its derivatives) and/or the illegal use of any controlled substances is strictly prohibited. Violation of this rule may result in the immediate and permanent discontinuation of any and all prescriptions being written by our practice. The use of any illegal substances is prohibited. Adopted CDC guidelines & recommendations: Target dosing levels will be at or below 60 MME/day. Use of benzodiazepines** is not recommended.  Exceptions: There are only two exceptions to the rule of not receiving pain medications from other Healthcare Providers. Exception #1 (Emergencies): In the event of an emergency (i.e.: accident requiring emergency care), you are allowed to receive additional pain medication. However, you are responsible for: As soon as you are able, call our office [phone], at any time of the day or night, and leave a message stating your name, the date and nature of the emergency, and the name and dose of the medication prescribed. In the event that your call is answered by a member of our staff, make sure to document and save the date, time, and the name of the person that took your information.  Exception #2 (Planned Surgery): In the event that you are scheduled by another doctor or dentist to have any type of surgery or procedure, you are allowed (for a period no  longer than 30 days), to receive additional pain medication, for the acute post-op pain. However, in this case, you are responsible for picking up a copy of   our "Post-op Pain Management for Surgeons" handout, and giving it to your surgeon or dentist. This document is available at our office, and does not require an appointment to obtain it. Simply go to our office during business hours (Monday-Thursday from 8:00 AM to 4:00 PM) (Friday 8:00 AM to 12:00 Noon) or if you have a scheduled appointment with us, prior to your surgery, and ask for it by name. In addition, you are responsible for: calling our office [phone], at any time of the day or night, and leaving a message stating your name, name of your surgeon, type of surgery, and date of procedure or surgery. Failure to comply with your responsibilities may result in termination of therapy involving the controlled substances. Medication Agreement Violation. Following the above rules, including your responsibilities will help you in avoiding a Medication Agreement Violation ("Breaking your Pain Medication Contract").  Consequences:  Not following the above rules may result in permanent discontinuation of medication prescription therapy.  *Opioid medications include: morphine, codeine, oxycodone, oxymorphone, hydrocodone, hydromorphone, meperidine, tramadol, tapentadol, buprenorphine, fentanyl, methadone. **Benzodiazepine medications include: diazepam (Valium), alprazolam (Xanax), clonazepam (Klonopine), lorazepam (Ativan), clorazepate (Tranxene), chlordiazepoxide (Librium), estazolam (Prosom), oxazepam (Serax), temazepam (Restoril), triazolam (Halcion) (Last updated: 05/11/2022) ______________________________________________________________________    ______________________________________________________________________  Medication Recommendations and Reminders  Applies to: All patients receiving prescriptions (written and/or  electronic).  Medication Rules & Regulations: You are responsible for reading, knowing, and following our "Medication Rules" document. These exist for your safety and that of others. They are not flexible and neither are we. Dismissing or ignoring them is an act of "non-compliance" that may result in complete and irreversible termination of such medication therapy. For safety reasons, "non-compliance" will not be tolerated. As with the [place] fundamental legal principle of "ignorance of the law is no defense", we will accept no excuses for not having read and knowing the content of documents provided to you by our practice.  Pharmacy of record:  Definition: This is the pharmacy where your electronic prescriptions will be sent.  We do not endorse any particular pharmacy. It is up to you and your insurance to decide what pharmacy to use.  We do not restrict you in your choice of pharmacy. However, once we write for your prescriptions, we will NOT be re-sending more prescriptions to fix restricted supply problems created by your pharmacy, or your insurance.  The pharmacy listed in the electronic medical record should be the one where you want electronic prescriptions to be sent. If you choose to change pharmacy, simply notify our nursing staff. Changes will be made only during your regular appointments and not over the phone.  Recommendations: Keep all of your pain medications in a safe place, under lock and key, even if you live alone. We will NOT replace lost, stolen, or damaged medication. We do not accept "Police Reports" as proof of medications having been stolen. After you fill your prescription, take 1 week's worth of pills and put them away in a safe place. You should keep a separate, properly labeled bottle for this purpose. The remainder should be kept in the original bottle. Use this as your primary supply, until it runs out. Once it's gone, then you know that you have 1 week's worth of medicine,  and it is time to come in for a prescription refill. If you do this correctly, it is unlikely that you will ever run out of medicine. To make sure that the above recommendation works, it is very important that you make   sure your medication refill appointments are scheduled at least 1 week before you run out of medicine. To do this in an effective manner, make sure that you do not leave the office without scheduling your next medication management appointment. Always ask the nursing staff to show you in your prescription , when your medication will be running out. Then arrange for the receptionist to get you a return appointment, at least 7 days before you run out of medicine. Do not wait until you have 1 or 2 pills left, to come in. This is very poor planning and does not take into consideration that we may need to cancel appointments due to bad weather, sickness, or emergencies affecting our staff. DO NOT ACCEPT A "Partial Fill": If for any reason your pharmacy does not have enough pills/tablets to completely fill or refill your prescription, do not allow for a "partial fill". The law allows the pharmacy to complete that prescription within 72 hours, without requiring a new prescription. If they do not fill the rest of your prescription within those 72 hours, you will need a separate prescription to fill the remaining amount, which we will NOT provide. If the reason for the partial fill is your insurance, you will need to talk to the pharmacist about payment alternatives for the remaining tablets, but again, DO NOT ACCEPT A PARTIAL FILL, unless you can trust your pharmacist to obtain the remainder of the pills within 72 hours.  Prescription refills and/or changes in medication(s):  Prescription refills, and/or changes in dose or medication, will be conducted only during scheduled medication management appointments. (Applies to both, written and electronic prescriptions.) No refills on procedure days. No  medication will be changed or started on procedure days. No changes, adjustments, and/or refills will be conducted on a procedure day. Doing so will interfere with the diagnostic portion of the procedure. No phone refills. No medications will be "called into the pharmacy". No Fax refills. No weekend refills. No Holliday refills. No after hours refills.  Remember:  Business hours are:  Monday to Thursday 8:00 AM to 4:00 PM Provider's Schedule: Avraham Benish, MD - Appointments are:  Medication management: Monday and Wednesday 8:00 AM to 4:00 PM Procedure day: Tuesday and Thursday 7:30 AM to 4:00 PM Bilal Lateef, MD - Appointments are:  Medication management: Tuesday and Thursday 8:00 AM to 4:00 PM Procedure day: Monday and Wednesday 7:30 AM to 4:00 PM (Last update: 05/11/2022) ______________________________________________________________________    ____________________________________________________________________________________________  Drug Holidays  What is a "Drug Holiday"? Drug Holiday: is the name given to the process of slowly tapering down and temporarily stopping the pain medication for the purpose of decreasing or eliminating tolerance to the drug.  Benefits Improved effectiveness Decreased required effective dose Improved pain control End dependence on high dose therapy Decrease cost of therapy Uncovering "opioid-induced hyperalgesia". (OIH)  What is "opioid hyperalgesia"? It is a paradoxical increase in pain caused by exposure to opioids. Stopping the opioid pain medication, contrary to the expected, it actually decreases or completely eliminates the pain. Ref.: "A comprehensive review of opioid-induced hyperalgesia". Marion Lee, et.al. Pain Physician. 2011 Mar-Apr;14(2):145-61.  What is tolerance? Tolerance: the progressive loss of effectiveness of a pain medicine due to repetitive use. A common problem of opioid pain medications.  How long should a "Drug  Holiday" last? Effectiveness depends on the patient staying off all opioid pain medicines for a minimum of 14 consecutive days. (2 weeks)  How about just taking less of the medicine? Does not   work. Will not accomplish goal of eliminating the excess receptors.  How about switching to a different pain medicine? (AKA. "Opioid rotation") Does not work. Creates the illusion of effectiveness by taking advantage of inaccurate equivalent dose calculations between different opioids. -This "technique" was promoted by studies funded by pharmaceutical companies, such as [employer], creators of "OxyContin".  Can I stop the medicine "cold turkey"? We do not recommend it. You should always coordinate with your prescribing physician to make the transition as smoothly as possible. Avoid stopping the medicine abruptly without consulting. We recommend a "slow taper".  What is a slow taper? Taper: refers to the gradual decrease in dose.   How do I stop/taper the dose? Slowly. Decrease the daily amount of pills that you take by one (1) pill every seven (7) days. This is called a "slow downward taper". Example: if you normally take four (4) pills per day, drop it to three (3) pills per day for seven (7) days, then to two (2) pills per day for seven (7) days, then to one (1) per day for seven (7) days, and then stop the medicine. The 14 day "Drug Holiday" starts on the first day without medicine.   Will I experience withdrawals? Unlikely with a slow taper.  What triggers withdrawals? Withdrawals are triggered by the sudden/abrupt stop of high dose opioids. Withdrawals can be avoided by slowly decreasing the dose over a prolonged period of time.  What are withdrawals? Symptoms associated with sudden/abrupt reduction/stopping of high-dose, long-term use of pain medication. Withdrawal are seldom seen on low dose therapy, or patients rarely taking opioid medication.  Early Withdrawal Symptoms may  include: Agitation Anxiety Muscle aches Increased tearing Insomnia Runny nose Sweating Yawning  Late symptoms may include: Abdominal cramping Diarrhea Dilated pupils Goose bumps Nausea Vomiting  When could I see withdrawals? Onset: 8-24 hours after last use for most opioids. 12-48 hours for long-acting opioids (i.e.: methadone)  How long could they last? Duration: 4-10 days for most opioids. 14-21 days for long-acting opioids (i.e.: methadone)  What will happen after I complete my "Drug Holiday"? The need and indications for the opioid analgesic will be reviewed before restarting the medication. Dose requirements will likely decrease and the dose will need to be adjusted accordingly.   (Last update: 10/06/2022) ____________________________________________________________________________________________    ____________________________________________________________________________________________  WARNING: CBD (cannabidiol) & Delta (Delta-8 tetrahydrocannabinol) products.   Applicable to:  All individuals currently taking or considering taking CBD (cannabidiol) and, more important, all patients taking opioid analgesic controlled substances (pain medication). (Example: oxycodone; oxymorphone; hydrocodone; hydromorphone; morphine; methadone; tramadol; tapentadol; fentanyl; buprenorphine; butorphanol; dextromethorphan; meperidine; codeine; etc.)  Introduction:  Recently there has been a drive towards the use of "natural" products for the treatment of different conditions, including pain anxiety and sleep disorders. Marijuana and hemp are two varieties of the cannabis genus plants. Marijuana and its derivatives are illegal, while hemp and its derivatives are not. Cannabidiol (CBD) and tetrahydrocannabinol (THC), are two natural compounds found in plants of the Cannabis genus. They can both be extracted from hemp or marijuana. Both compounds interact with your body's endocannabinoid  system in very different ways. CBD is associated with pain relief (analgesia) while THC is associated with the psychoactive effects ("the high") obtained from the use of marijuana products. There are two main types of THC: Delta-9, which comes from the marijuana plant and it is illegal, and Delta-8, which comes from the hemp plant, and it is legal. (Both, Delta-9-THC and Delta-8-THC are psychoactive and   give you "the high".)   Legality:  Marijuana and its derivatives: illegal Hemp and its derivatives: Legal (State dependent) UPDATE: (09/04/2021) The Drug Enforcement Agency (DEA) issued a letter stating that "delta" cannabinoids, including Delta-8-THCO and Delta-9-THCO, synthetically derived from hemp do not qualify as hemp and will be viewed as Schedule I drugs. (Schedule I drugs, substances, or chemicals are defined as drugs with no currently accepted medical use and a high potential for abuse. Some examples of Schedule I drugs are: heroin, lysergic acid diethylamide (LSD), marijuana (cannabis), 3,4-methylenedioxymethamphetamine (ecstasy), methaqualone, and peyote.) (https://www.dea.gov)  Legal status of CBD in [place]:  "Conditionally Legal"  Reference: "FDA Regulation of Cannabis and Cannabis-Derived Products, Including Cannabidiol (CBD)" - https://www.fda.gov/news-events/public-health-focus/fda-regulation-cannabis-and-cannabis-derived-products-including-cannabidiol-cbd  Warning:  CBD is not FDA approved and has not undergo the same manufacturing controls as prescription drugs.  This means that the purity and safety of available CBD may be questionable. Most of the time, despite manufacturer's claims, it is contaminated with THC (delta-9-tetrahydrocannabinol - the chemical in marijuana responsible for the "HIGH").  When this is the case, the THC contaminant will trigger a positive urine drug screen (UDS) test for Marijuana (carboxy-THC).   The FDA recently put out a warning about 5 things that everyone  should be aware of regarding Delta-8 THC: Delta-8 THC products have not been evaluated or approved by the FDA for safe use and may be marketed in ways that put the public health at risk. The FDA has received adverse event reports involving delta-8 THC-containing products. Delta-8 THC has psychoactive and intoxicating effects. Delta-8 THC manufacturing often involve use of potentially harmful chemicals to create the concentrations of delta-8 THC claimed in the marketplace. The final delta-8 THC product may have potentially harmful by-products (contaminants) due to the chemicals used in the process. Manufacturing of delta-8 THC products may occur in uncontrolled or unsanitary settings, which may lead to the presence of unsafe contaminants or other potentially harmful substances. Delta-8 THC products should be kept out of the reach of children and pets.  NOTE: Because a positive UDS for any illicit substance is a violation of our medication agreement, your opioid analgesics (pain medicine) may be permanently discontinued.  MORE ABOUT CBD  General Information: CBD was discovered in 1940 and it is a derivative of the cannabis sativa genus plants (Marijuana and Hemp). It is one of the 113 identified substances found in Marijuana. It accounts for up to 40% of the plant's extract. As of 2018, preliminary clinical studies on CBD included research for the treatment of anxiety, movement disorders, and pain. CBD is available and consumed in multiple forms, including inhalation of smoke or vapor, as an aerosol spray, and by mouth. It may be supplied as an oil containing CBD, capsules, dried cannabis, or as a liquid solution. CBD is thought not to be as psychoactive as THC (delta-9-tetrahydrocannabinol - the chemical in marijuana responsible for the "HIGH"). Studies suggest that CBD may interact with different biological target receptors in the body, including cannabinoid and other neurotransmitter receptors. As of  2018 the mechanism of action for its biological effects has not been determined.  Side-effects  Adverse reactions: Dry mouth, diarrhea, decreased appetite, fatigue, drowsiness, malaise, weakness, sleep disturbances, and others.  Drug interactions:  CBD may interact with medications such as blood-thinners. CBD causes drowsiness on its own and it will increase drowsiness caused by other medications, including antihistamines (such as Benadryl), benzodiazepines (Xanax, Ativan, Valium), antipsychotics, antidepressants, opioids, alcohol and supplements such as kava, melatonin and St. John's Wort.    Other drug interactions: Brivaracetam (Briviact); Caffeine; Carbamazepine (Tegretol); Citalopram (Celexa); Clobazam (Onfi); Eslicarbazepine (Aptiom); Everolimus (Zostress); Lithium; Methadone (Dolophine); Rufinamide (Banzel); Sedative medications (CNS depressants); Sirolimus (Rapamune); Stiripentol (Diacomit); Tacrolimus (Prograf); Tamoxifen ; Soltamox); Topiramate (Topamax); Valproate; Warfarin (Coumadin); Zonisamide. (Last update: 06/28/2022) ____________________________________________________________________________________________   ____________________________________________________________________________________________  Naloxone Nasal Spray  Why am I receiving this medication? [place] STOP ACT requires that all patients taking high dose opioids or at risk of opioids respiratory depression, be prescribed an opioid reversal agent, such as Naloxone (AKA: Narcan).  What is this medication? NALOXONE (nal OX one) treats opioid overdose, which causes slow or shallow breathing, severe drowsiness, or trouble staying awake. Call emergency services after using this medication. You may need additional treatment. Naloxone works by reversing the effects of opioids. It belongs to a group of medications called opioid blockers.  COMMON BRAND NAME(S): Kloxxado, Narcan  What should I tell my care team before  I take this medication? They need to know if you have any of these conditions: Heart disease Substance use disorder An unusual or allergic reaction to naloxone, other medications, foods, dyes, or preservatives Pregnant or trying to get pregnant Breast-feeding  When to use this medication? This medication is to be used for the treatment of respiratory depression (less than 8 breaths per minute) secondary to opioid overdose.   How to use this medication? This medication is for use in the nose. Lay the person on their back. Support their neck with your hand and allow the head to tilt back before giving the medication. The nasal spray should be given into 1 nostril. After giving the medication, move the person onto their side. Do not remove or test the nasal spray until ready to use. Get emergency medical help right away after giving the first dose of this medication, even if the person wakes up. You should be familiar with how to recognize the signs and symptoms of a narcotic overdose. If more doses are needed, give the additional dose in the other nostril. Talk to your care team about the use of this medication in children. While this medication may be prescribed for children as young as newborns for selected conditions, precautions do apply.  Naloxone Overdosage: If you think you have taken too much of this medicine contact a poison control center or emergency room at once.  NOTE: This medicine is only for you. Do not share this medicine with others.  What if I miss a dose? This does not apply.  What may interact with this medication? This is only used during an emergency. No interactions are expected during emergency use. This list may not describe all possible interactions. Give your health care provider a list of all the medicines, herbs, non-prescription drugs, or dietary supplements you use. Also tell them if you smoke, drink alcohol, or use illegal drugs. Some items may interact with  your medicine.  What should I watch for while using this medication? Keep this medication ready for use in the case of an opioid overdose. Make sure that you have the phone number of your care team and local hospital ready. You may need to have additional doses of this medication. Each nasal spray contains a single dose. Some emergencies may require additional doses. After use, bring the treated person to the nearest hospital or call 911. Make sure the treating care team knows that the person has received a dose of this medication. You will receive additional instructions on what to do during and after use of this   medication before an emergency occurs.  What side effects may I notice from receiving this medication? Side effects that you should report to your care team as soon as possible: Allergic reactions--skin rash, itching, hives, swelling of the face, lips, tongue, or throat Side effects that usually do not require medical attention (report these to your care team if they continue or are bothersome): Constipation Dryness or irritation inside the nose Headache Increase in blood pressure Muscle spasms Stuffy nose Toothache This list may not describe all possible side effects. Call your doctor for medical advice about side effects. You may report side effects to FDA at 1-800-FDA-1088.  Where should I keep my medication? Because this is an emergency medication, you should keep it with you at all times.  Keep out of the reach of children and pets. Store between 20 and 25 degrees C (68 and 77 degrees F). Do not freeze. Throw away any unused medication after the expiration date. Keep in original box until ready to use.  NOTE: This sheet is a summary. It may not cover all possible information. If you have questions about this medicine, talk to your doctor, pharmacist, or health care provider.   2023 Elsevier/Gold Standard (2021-03-13  00:00:00)  ____________________________________________________________________________________________   

## 2022-10-26 NOTE — Progress Notes (Unsigned)
PROVIDER NOTE: Information contained herein reflects review and annotations entered in association with encounter. Interpretation of such information and data should be left to medically-trained personnel. Information provided to patient can be located elsewhere in the medical record under "Patient Instructions". Document created using STT-dictation technology, any transcriptional errors that may result from process are unintentional.    Patient: Nathan Lambert.  Service Category: E/M  Provider: Oswaldo Done, MD  DOB: June 22, 1981  DOS: 10/27/2022  Referring Provider: Smith Robert, MD  MRN: [id number]  Specialty: Interventional Pain Management  PCP: Smith Robert, MD (Inactive)  Type: Established Patient  Setting: Ambulatory outpatient    Location: Office  Delivery: Face-to-face     HPI  [name], a 42 y.o. year old male, is here today because of his Chronic pain syndrome [G89.4]. Mr. Vedros primary complain today is No chief complaint on file.  Pertinent problems: Mr. 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); Chronic lower extremity pain (3ry area of Pain) (Bilateral) (L>R); Lumbar facet hypertrophy (Bilateral); Lumbar facet syndrome (Bilateral) (R>L); Neurogenic pain; Spondylosis without myelopathy or radiculopathy, lumbar region; Other specified dorsopathies, sacral and sacrococcygeal region; Lumbar spondylosis; Chronic upper extremity pain (Left); Pain and numbness of left upper extremity; Cervical radiculitis (C6/C7) (Left); DDD (degenerative disc  disease), cervical; Cervicalgia; Osteoarthritis involving multiple joints; Chronic hip pain (4th area of Pain) (Bilateral) (L>R); Greater trochanteric bursitis (Left); Lumbar back pain with radiculopathy affecting left lower extremity (L5 dermatomal distribution); Subacute lumbar radiculopathy (L5) (Left); Chronic low back pain (1ry area of Pain) (Bilateral) w/o sciatica; Decreased range of motion of both hips; Chronic hip pain (Left); Enthesopathy of hip region (Left); Other bursitis of hip (Left); and Chronic groin pain (Left) on their pertinent problem list. Pain Assessment: Severity of   is reported as a  /10. Location:    / . Onset:  . Quality:  . Timing:  . Modifying factor(s):  Marland Kitchen Vitals:  vitals were not taken for this visit.  BMI: Estimated body mass index is 34.87 kg/m as calculated from the following:   Height as of 07/26/22: 5\' 11"  (1.803 m).   Weight as of 07/26/22: 250 lb (113.4 kg). Last encounter: 07/26/2022. Last procedure: Visit date not found.  Reason for encounter: medication management. ***  RTCB: 01/27/2023   Pharmacotherapy Assessment  Analgesic: Oxycodone IR 10 mg 4 times daily. MME/day: 60 mg/day.   Monitoring: [place] PMP: PDMP reviewed during this encounter.       Pharmacotherapy: No side-effects or adverse reactions reported. Compliance: No problems identified. Effectiveness: Clinically acceptable.  No notes on file  No results found for: "CBDTHCR" No results found for: "D8THCCBX" No results found for: "D9THCCBX"  UDS:  Summary  Date Value Ref Range Status  01/27/2022 Note  Final    Comment:    ==================================================================== [facility] (MW) ==================================================================== Test                             Result       Flag       Units  Drug Present and Declared for Prescription Verification   Oxycodone  766          EXPECTED   ng/mg creat   Oxymorphone                     1593         EXPECTED   ng/mg creat   Noroxycodone                   592          EXPECTED   ng/mg creat   Noroxymorphone                 304          EXPECTED   ng/mg creat    Sources of oxycodone are scheduled prescription medications.    Oxymorphone, noroxycodone, and noroxymorphone are expected    metabolites of oxycodone. Oxymorphone is also available as a    scheduled prescription medication.  ==================================================================== Test                      Result    Flag   Units      Ref Range   Creatinine              178              mg/dL      >=20 ==================================================================== Declared Medications:  The flagging and interpretation on this report are based on the  following declared medications.  Unexpected results may arise from  inaccuracies in the declared medications.   **Note: The testing scope of this panel includes these medications:   Oxycodone   **Note: The testing scope of this panel does not include the  following reported medications:   Azelastine (Astelin)  Clobetasol (Temovate)  Econazole  Famotidine (Pepcid)  Fluticasone (Flonase)  Gentamicin  Glipizide (Glucotrol)  Loratadine (Claritin)  Magnesium  Montelukast (Singulair)  Naloxone (Narcan)  Terbinafine (Lamisil)  Testosterone ==================================================================== For clinical consultation, please call [phone]. ====================================================================       ROS  Constitutional: Denies any fever or chills Gastrointestinal: No reported hemesis, hematochezia, vomiting, or acute GI distress Musculoskeletal: Denies any acute onset joint swelling, redness, loss of ROM, or weakness Neurological: No reported episodes of acute onset apraxia, aphasia, dysarthria, agnosia, amnesia, paralysis, loss of coordination, or loss of consciousness  Medication Review   Magnesium, Magnesium Oxide -Mg Supplement, Oxycodone HCl, azelastine, clobetasol ointment, doxycycline, econazole nitrate, famotidine, fluticasone, gentamicin cream, glipiZIDE, loratadine, montelukast, naloxone, terbinafine, and testosterone cypionate  History Review  Allergy: Mr. Dutton is allergic to bactrim [sulfamethoxazole-trimethoprim], pollen extract, sulfa antibiotics, and penicillins. Drug: Mr. Schuerger  reports no history of drug use. Alcohol:  reports no history of alcohol use. Tobacco:  reports that he has been smoking cigarettes. He has a 10.00 pack-year smoking history. He has never used smokeless tobacco. Social: Mr. Lastrapes  reports that he has been smoking cigarettes. He has a 10.00 pack-year smoking history. He has never used smokeless tobacco. He reports that he does not drink alcohol and does not use drugs. Medical:  has a past medical history of Allergy (2012), Chronic low back pain (Primary Area of Pain) (Bilateral) (R>L), Degeneration of lumbar or lumbosacral intervertebral disc (L4-L5) (12/27/2016), Gastroesophageal reflux disease without esophagitis (10/26/2021), Headache, Mixed hyperlipidemia (10/26/2021), and Opiate use (02/15/2017). Surgical: Mr. Montour  has a past surgical history that includes Fracture surgery (Left); Spine surgery (Right, 06/16/2015); and Back surgery. Family: family history includes AAA (abdominal aortic aneurysm) in his mother;  Diabetes in his father; Stroke in his mother.  Laboratory Chemistry Profile   Renal Lab Results  Component Value Date   BUN 9 05/23/2022   CREATININE 0.76 05/23/2022   BCR 7 (L) 12/27/2016   GFRAA >60 06/07/2019   GFRNONAA >60 05/23/2022    Hepatic Lab Results  Component Value Date   AST 16 05/23/2022   ALT 12 05/23/2022   ALBUMIN 4.0 05/23/2022   ALKPHOS 51 05/23/2022    Electrolytes Lab Results  Component Value Date   NA 140 05/23/2022   K 3.9 05/23/2022   CL 107 05/23/2022   CALCIUM 9.1  05/23/2022   MG 2.0 06/07/2019    Bone Lab Results  Component Value Date   VD25OH 15.51 (L) 06/07/2019   25OHVITD1 17 (L) 12/27/2016   25OHVITD2 <1.0 12/27/2016   25OHVITD3 17 12/27/2016   TESTOFREE 2.6 (L) 06/08/2017   TESTOSTERONE 116 (L) 06/08/2017    Inflammation (CRP: Acute Phase) (ESR: Chronic Phase) Lab Results  Component Value Date   CRP 2.7 (H) 06/07/2019   ESRSEDRATE 17 (H) 06/07/2019         Note: Above Lab results reviewed.  Recent Imaging Review  CT PELVIS W CONTRAST CLINICAL DATA:  Patient complains of abscess on his left testicle  EXAM: CT PELVIS WITH CONTRAST  TECHNIQUE: Multidetector CT imaging of the pelvis was performed using the standard protocol following the bolus administration of intravenous contrast.  RADIATION DOSE REDUCTION: This exam was performed according to the departmental dose-optimization program which includes automated exposure control, adjustment of the mA and/or kV according to patient size and/or use of iterative reconstruction technique.  CONTRAST:  OMNIPAQUE IOHEXOL 300 MG/ML  SOLN  COMPARISON:  None Available.  FINDINGS: Urinary Tract:  No abnormality visualized.  Bowel:  Unremarkable visualized pelvic bowel loops.  Vascular/Lymphatic: Enlarged bilateral inguinal lymph nodes measuring up to 1.8 cm on the left and 1.7 cm on the right.  Reproductive:  Bilateral hydroceles.  Other: Edema within the scrotum extending into the peroneal subcutaneous fat. No subcutaneous gas.  Musculoskeletal: No suspicious bone lesions identified.  IMPRESSION: Bilateral varicoceles.  Nonspecific soft tissue edema within the scrotal wall extending into the perineum subcutaneous fat. No subcutaneous gas. No drainable fluid collection.  Enlarged bilateral inguinal nodes likely reactive given history of scrotal abscesses.  Electronically Signed   By: Minerva Fester M.D.   On: 05/24/2022 00:35 Note: Reviewed        Physical  Exam  General appearance: Well nourished, well developed, and well hydrated. In no apparent acute distress Mental status: Alert, oriented x 3 (person, place, & time)       Respiratory: No evidence of acute respiratory distress Eyes: PERLA Vitals: There were no vitals taken for this visit. BMI: Estimated body mass index is 34.87 kg/m as calculated from the following:   Height as of 07/26/22: 5\' 11"  (1.803 m).   Weight as of 07/26/22: 250 lb (113.4 kg). Ideal: Patient weight not recorded  Assessment   Diagnosis Status  1. Chronic pain syndrome   2. Chronic low back pain (1ry area of Pain) (Bilateral) w/o sciatica   3. Chronic knee pain (2ry area of Pain) (Left)   4. Chronic lower extremity pain (3ry area of Pain) (Bilateral) (L>R)   5. Chronic hip pain (4th area of Pain) (Bilateral) (L>R)   6. 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 Pharmacologic therapy   8. Chronic use of opiate for therapeutic purpose   9. Encounter  for medication management   10. Encounter for chronic pain management    Controlled Controlled Controlled   Updated Problems: No problems updated.  Plan of Care  Problem-specific:  No problem-specific Assessment & Plan notes found for this encounter.  Mr. Jamahl Lemmons. has a current medication list which includes the following long-term medication(s): azelastine, famotidine, fluticasone, glipizide, loratadine, magnesium, magnesium oxide -mg supplement, montelukast, naloxone, oxycodone hcl, and oxycodone hcl.  Pharmacotherapy (Medications Ordered): No orders of the defined types were placed in this encounter.  Orders:  No orders of the defined types were placed in this encounter.  Follow-up plan:   No follow-ups on file.      Interventional Therapies  Risk  Complexity Considerations:   Estimated body mass index is 36.26 kg/m as calculated from the following:   Height as of this encounter: 5\' 11"  (1.803 m).   Weight as of this  encounter: 260 lb (117.9 kg). WNL   Planned  Pending:      Under consideration:   Possible left SI joint RFA Diagnostic caudal ESI + diagnostic epidurogram  Possible Racz procedure  Diagnostic left IA knee injection (w/ steroid)  Possible left IA Hyalgan knee injections  Diagnostic left Genicular NB  Possible left Genicular nerve RFA    Completed:   Diagnostic/therapeutic left IA hip joint + bursa injection x1 (11/25/2020) (100/100/0/0)  Therapeutic right lumbar facet MBB x3 (05/15/2020) (100/100/50/50)  Therapeutic left lumbar facet MBB x3 (05/15/2020) 100/100/50/50)  Therapeutic right lumbar facet RFA x1 (10/11/2017) NR Therapeutic left lumbar facet RFA x1 (11/24/2017) NR Therapeutic right SI joint block x1 (05/19/2017)  Therapeutic left SI joint block x1 (05/19/2017)  Therapeutic right SI joint RFA x1 (10/11/2017)  Therapeutic right L4 TFES2 x1 (05/30/2018)    Therapeutic  Palliative (PRN) options:   Palliative bilateral lumbar facet block #4 Palliative bilateral SI joint block #2  Diagnostic right L4 TFES2 #2    Pharmacotherapy  Nonopioids transferred 05/07/2020: Magnesium, vitamin D3, and calcium       Recent Visits No visits were found meeting these conditions. Showing recent visits within past 90 days and meeting all other requirements Future Appointments Date Type Provider Dept  10/27/22 Appointment Delano Metz, MD [facility]  Showing future appointments within next 90 days and meeting all other requirements  I discussed the assessment and treatment plan with the patient. The patient was provided an opportunity to ask questions and all were answered. The patient agreed with the plan and demonstrated an understanding of the instructions.  Patient advised to call back or seek an in-person evaluation if the symptoms or condition worsens.  Duration of encounter: *** minutes.  Total time on encounter, as per AMA guidelines included both the  face-to-face and non-face-to-face time personally spent by the physician and/or other qualified health care professional(s) on the day of the encounter (includes time in activities that require the physician or other qualified health care professional and does not include time in activities normally performed by clinical staff). Physician's time may include the following activities when performed: Preparing to see the patient (e.g., pre-charting review of records, searching for previously ordered imaging, lab work, and nerve conduction tests) Review of prior analgesic pharmacotherapies. Reviewing PMP Interpreting ordered tests (e.g., lab work, imaging, nerve conduction tests) Performing post-procedure evaluations, including interpretation of diagnostic procedures Obtaining and/or reviewing separately obtained history Performing a medically appropriate examination and/or evaluation Counseling and educating the patient/family/caregiver Ordering medications, tests, or procedures Referring and communicating with other health  care professionals (when not separately reported) Documenting clinical information in the electronic or other health record Independently interpreting results (not separately reported) and communicating results to the patient/ family/caregiver Care coordination (not separately reported)  Note by: Oswaldo DoneFrancisco A Danilynn Jemison, MD Date: 10/27/2022; Time: 1:04 PM

## 2022-10-27 ENCOUNTER — Ambulatory Visit: Payer: BLUE CROSS/BLUE SHIELD | Attending: Pain Medicine | Admitting: Pain Medicine

## 2022-10-27 ENCOUNTER — Encounter: Payer: Self-pay | Admitting: Pain Medicine

## 2022-10-27 VITALS — BP 127/72 | HR 84 | Temp 98.2°F | Resp 18 | Ht 71.0 in | Wt 270.0 lb

## 2022-10-27 DIAGNOSIS — M79604 Pain in right leg: Secondary | ICD-10-CM | POA: Diagnosis present

## 2022-10-27 DIAGNOSIS — M79605 Pain in left leg: Secondary | ICD-10-CM | POA: Insufficient documentation

## 2022-10-27 DIAGNOSIS — M25551 Pain in right hip: Secondary | ICD-10-CM | POA: Diagnosis present

## 2022-10-27 DIAGNOSIS — G8929 Other chronic pain: Secondary | ICD-10-CM | POA: Insufficient documentation

## 2022-10-27 DIAGNOSIS — M545 Low back pain, unspecified: Secondary | ICD-10-CM | POA: Diagnosis not present

## 2022-10-27 DIAGNOSIS — Z79899 Other long term (current) drug therapy: Secondary | ICD-10-CM | POA: Diagnosis present

## 2022-10-27 DIAGNOSIS — M25552 Pain in left hip: Secondary | ICD-10-CM | POA: Insufficient documentation

## 2022-10-27 DIAGNOSIS — Z79891 Long term (current) use of opiate analgesic: Secondary | ICD-10-CM

## 2022-10-27 DIAGNOSIS — M961 Postlaminectomy syndrome, not elsewhere classified: Secondary | ICD-10-CM | POA: Diagnosis present

## 2022-10-27 DIAGNOSIS — M25562 Pain in left knee: Secondary | ICD-10-CM | POA: Diagnosis not present

## 2022-10-27 DIAGNOSIS — G894 Chronic pain syndrome: Secondary | ICD-10-CM | POA: Insufficient documentation

## 2022-10-27 MED ORDER — OXYCODONE HCL 10 MG PO TABS: 10.0000 mg | ORAL_TABLET | Freq: Four times a day (QID) | ORAL | 0 refills | Status: DC | PRN

## 2022-10-27 NOTE — Progress Notes (Signed)
Nursing Pain Medication Assessment:  Safety precautions to be maintained throughout the outpatient stay will include: orient to surroundings, keep bed in low position, maintain call bell within reach at all times, provide assistance with transfer out of bed and ambulation.  Medication Inspection Compliance: Pill count conducted under aseptic conditions, in front of the patient. Neither the pills nor the bottle was removed from the patient's sight at any time. Once count was completed pills were immediately returned to the patient in their original bottle.  Medication: Oxycodone IR Pill/Patch Count:  11 of 120 pills remain Pill/Patch Appearance: Markings consistent with prescribed medication Bottle Appearance: Standard pharmacy container. Clearly labeled. Filled Date: 3 / 68 / 2024 Last Medication intake:  Today

## 2022-10-29 ENCOUNTER — Other Ambulatory Visit: Payer: Self-pay
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
## 2023-01-05 ENCOUNTER — Ambulatory Visit: Payer: BLUE CROSS/BLUE SHIELD | Admitting: Urology

## 2023-01-23 NOTE — Patient Instructions (Incomplete)
____________________________________________________________________________________________  Opioid Pain Medication Update  To: All patients taking opioid pain medications. (I.e.: hydrocodone, hydromorphone, oxycodone, oxymorphone, morphine, codeine, methadone, tapentadol, tramadol, buprenorphine, fentanyl, etc.)  Re: Updated review of side effects and adverse reactions of opioid analgesics, as well as new information about long term effects of this class of medications.  Direct risks of long-term opioid therapy are not limited to opioid addiction and overdose. Potential medical risks include serious fractures, breathing problems during sleep, hyperalgesia, immunosuppression, chronic constipation, bowel obstruction, myocardial infarction, and tooth decay secondary to xerostomia.  Unpredictable adverse effects that can occur even if you take your medication correctly: Cognitive impairment, respiratory depression, and death. Most people think that if they take their medication "correctly", and "as instructed", that they will be safe. Nothing could be farther from the truth. In reality, a significant amount of recorded deaths associated with the use of opioids has occurred in individuals that had taken the medication for a long time, and were taking their medication correctly. The following are examples of how this can happen: Patient taking his/her medication for a long time, as instructed, without any side effects, is given a certain antibiotic or another unrelated medication, which in turn triggers a "Drug-to-drug interaction" leading to disorientation, cognitive impairment, impaired reflexes, respiratory depression or an untoward event leading to serious bodily harm or injury, including death.  Patient taking his/her medication for a long time, as instructed, without any side effects, develops an acute impairment of liver and/or kidney function. This will lead to a rapid inability of the body to  breakdown and eliminate their pain medication, which will result in effects similar to an "overdose", but with the same medicine and dose that they had always taken. This again may lead to disorientation, cognitive impairment, impaired reflexes, respiratory depression or an untoward event leading to serious bodily harm or injury, including death.  A similar problem will occur with patients as they grow older and their liver and kidney function begins to decrease as part of the aging process.  Background information: Historically, the original case for using long-term opioid therapy to treat chronic noncancer pain was based on safety assumptions that subsequent experience has called into question. In 1996, the American Pain Society and the American Academy of Pain Medicine issued a consensus statement supporting long-term opioid therapy. This statement acknowledged the dangers of opioid prescribing but concluded that the risk for addiction was low; respiratory depression induced by opioids was short-lived, occurred mainly in opioid-naive patients, and was antagonized by pain; tolerance was not a common problem; and efforts to control diversion should not constrain opioid prescribing. This has now proven to be wrong. Experience regarding the risks for opioid addiction, misuse, and overdose in community practice has failed to support these assumptions.  According to the Centers for Disease Control and Prevention, fatal overdoses involving opioid analgesics have increased sharply over the past decade. Currently, more than 96,700 people die from drug overdoses every year. Opioids are a factor in 7 out of every 10 overdose deaths. Deaths from drug overdose have surpassed motor vehicle accidents as the leading cause of death for individuals between the ages of 35 and 54.  Clinical data suggest that neuroendocrine dysfunction may be very common in both men and women, potentially causing hypogonadism, erectile  dysfunction, infertility, decreased libido, osteoporosis, and depression. Recent studies linked higher opioid dose to increased opioid-related mortality. Controlled observational studies reported that long-term opioid therapy may be associated with increased risk for cardiovascular events. Subsequent meta-analysis concluded   that the safety of long-term opioid therapy in elderly patients has not been proven.   Side Effects and adverse reactions: Common side effects: Drowsiness (sedation). Dizziness. Nausea and vomiting. Constipation. Physical dependence -- Dependence often manifests with withdrawal symptoms when opioids are discontinued or decreased. Tolerance -- As you take repeated doses of opioids, you require increased medication to experience the same effect of pain relief. Respiratory depression -- This can occur in healthy people, especially with higher doses. However, people with COPD, asthma or other lung conditions may be even more susceptible to fatal respiratory impairment.  Uncommon side effects: An increased sensitivity to feeling pain and extreme response to pain (hyperalgesia). Chronic use of opioids can lead to this. Delayed gastric emptying (the process by which the contents of your stomach are moved into your small intestine). Muscle rigidity. Immune system and hormonal dysfunction. Quick, involuntary muscle jerks (myoclonus). Arrhythmia. Itchy skin (pruritus). Dry mouth (xerostomia).  Long-term side effects: Chronic constipation. Sleep-disordered breathing (SDB). Increased risk of bone fractures. Hypothalamic-pituitary-adrenal dysregulation. Increased risk of overdose.  RISKS: Fractures and Falls:  Opioids increase the risk and incidence of falls. This is of particular importance in elderly patients.  Endocrine System:  Long-term administration is associated with endocrine abnormalities (endocrinopathies). (Also known as Opioid-induced Endocrinopathy) Influences  on both the hypothalamic-pituitary-adrenal axis?and the hypothalamic-pituitary-gonadal axis have been demonstrated with consequent hypogonadism and adrenal insufficiency in both sexes. Hypogonadism and decreased levels of dehydroepiandrosterone sulfate have been reported in men and women. Endocrine effects include: Amenorrhoea in women (abnormal absence of menstruation) Reduced libido in both sexes Decreased sexual function Erectile dysfunction in men Hypogonadisms (decreased testicular function with shrinkage of testicles) Infertility Depression and fatigue Loss of muscle mass Anxiety Depression Immune suppression Hyperalgesia Weight gain Anemia Osteoporosis Patients (particularly women of childbearing age) should avoid opioids. There is insufficient evidence to recommend routine monitoring of asymptomatic patients taking opioids in the long-term for hormonal deficiencies.  Immune System: Human studies have demonstrated that opioids have an immunomodulating effect. These effects are mediated via opioid receptors both on immune effector cells and in the central nervous system. Opioids have been demonstrated to have adverse effects on antimicrobial response and anti-tumour surveillance. Buprenorphine has been demonstrated to have no impact on immune function.  Opioid Induced Hyperalgesia: Human studies have demonstrated that prolonged use of opioids can lead to a state of abnormal pain sensitivity, sometimes called opioid induced hyperalgesia (OIH). Opioid induced hyperalgesia is not usually seen in the absence of tolerance to opioid analgesia. Clinically, hyperalgesia may be diagnosed if the patient on long-term opioid therapy presents with increased pain. This might be qualitatively and anatomically distinct from pain related to disease progression or to breakthrough pain resulting from development of opioid tolerance. Pain associated with hyperalgesia tends to be more diffuse than the  pre-existing pain and less defined in quality. Management of opioid induced hyperalgesia requires opioid dose reduction.  Cancer: Chronic opioid therapy has been associated with an increased risk of cancer among noncancer patients with chronic pain. This association was more evident in chronic strong opioid users. Chronic opioid consumption causes significant pathological changes in the small intestine and colon. Epidemiological studies have found that there is a link between opium dependence and initiation of gastrointestinal cancers. Cancer is the second leading cause of death after cardiovascular disease. Chronic use of opioids can cause multiple conditions such as GERD, immunosuppression and renal damage as well as carcinogenic effects, which are associated with the incidence of cancers.   Mortality: Long-term opioid use   has been associated with increased mortality among patients with chronic non-cancer pain (CNCP).  Prescription of long-acting opioids for chronic noncancer pain was associated with a significantly increased risk of all-cause mortality, including deaths from causes other than overdose.  Reference: Von Korff M, Kolodny A, Deyo RA, Chou R. Long-term opioid therapy reconsidered. Ann Intern Med. 2011 Sep 6;155(5):325-8. doi: 10.7326/[id number]. PMID: 21893626; PMCID: PMC3280085. Bedson J, Chen Y, Ashworth J, Hayward RA, Dunn KM, Jordan KP. Risk of adverse events in patients prescribed long-term opioids: A cohort study in the [place] Clinical Practice Research Datalink. Eur J Pain. 2019 May;23(5):908-922. doi: 10.1002/ejp.1357. Epub 2019 Jan 31. PMID: 30620116. Colameco S, Coren JS, Ciervo CA. Continuous opioid treatment for chronic noncancer pain: a time for moderation in prescribing. Postgrad Med. 2009 Jul;121(4):61-6. doi: 10.3810/pgm.2009.07.2032. PMID: 19641271. Chou R, Turner JA, Devine EB, Hansen RN, Sullivan SD, Blazina I, Dana T, Bougatsos C, Deyo RA. The  effectiveness and risks of long-term opioid therapy for chronic pain: a systematic review for a National Institutes of Health Pathways to Prevention Workshop. Ann Intern Med. 2015 Feb 17;162(4):276-86. doi: 10.7326/M14-2559. PMID: 25581257. Warner M, Chen LH, Makuc DM. NCHS Data Brief No. 22. [place]: Centers for Disease Control and Prevention; 2009. Sep, Increase in Fatal Poisonings Involving Opioid Analgesics in the [place], 1999-2006. Song IA, Choi HR, Oh TK. Long-term opioid use and mortality in patients with chronic non-cancer pain: Ten-year follow-up study in [place] from 2010 through 2019. EClinicalMedicine. 2022 Jul 18;51:101558. doi: 10.1016/j.eclinm.2022.101558. PMID: 35875817; PMCID: PMC9304910. Huser, W., Schubert, T., Vogelmann, T. et al. All-cause mortality in patients with long-term opioid therapy compared with non-opioid analgesics for chronic non-cancer pain: a database study. BMC Med 18, 162 (2020). https://doi.org/10.1186/[email or web address] Rashidian H, Zendehdel K, Kamangar F, Malekzadeh R, Haghdoost AA. An Ecological Study of the Association between Opiate Use and Incidence of Cancers. Addict Health. 2016 Fall;8(4):252-260. PMID: 28819556; PMCID: PMC5554805.  Our Goal: Our goal is to control your pain with means other than the use of opioid pain medications.  Our Recommendation: Talk to your physician about coming off of these medications. We can assist you with the tapering down and stopping these medicines. Based on the new information, even if you cannot completely stop the medication, a decrease in the dose may be associated with a lesser risk. Ask for other means of controlling the pain. Decrease or eliminate those factors that significantly contribute to your pain such as smoking, obesity, and a diet heavily tilted towards "inflammatory" nutrients.  Last Updated: 09/15/2022    ____________________________________________________________________________________________     ____________________________________________________________________________________________  Transfer of Pain Medication between Pharmacies  Re: 2023 DEA Clarification on existing regulation  Published on DEA Website: March 19, 2022  Title: Revised Regulation Allows DEA-Registered Pharmacies to Transfer Electronic Prescriptions at a Patient's Request DEA Headquarters Division - Public Information Office  "Patients now have the ability to request their electronic prescription be transferred to another pharmacy without having to go back to their practitioner to initiate the request. This revised regulation went into effect on Monday, March 15, 2022.     At a patient's request, a DEA-registered retail pharmacy can now transfer an electronic prescription for a controlled substance (schedules II-V) to another DEA-registered retail pharmacy. Prior to this change, patients would have to go through their practitioner to cancel their prescription and have it re-issued to a different pharmacy. The process was taxing and time consuming for both patients and practitioners.    The Drug Enforcement Administration (DEA) published its intent to revise the   process for transferring electronic prescriptions on June 06, 2020.  The final rule was published in the federal register on February 11, 2022 and went into effect 30 days later.  Under the final rule, a prescription can only be transferred once between pharmacies, and only if allowed under existing state or other applicable law. The prescription must remain in its electronic form; may not be altered in any way; and the transfer must be communicated directly between two licensed pharmacists. It's important to note, any authorized refills transfer with the original prescription, which means the entire prescription will be filled at the same pharmacy."     REFERENCES: 1. DEA website announcement https://www.dea.gov/stories/2023/2023-03/2022-09-01/revised-regulation-allows-dea-registered-pharmacies-transfer  2. Department of Justice website  https://www.govinfo.gov/content/pkg/FR-2022-02-11/pdf/2023-15847.pdf  3. DEPARTMENT OF JUSTICE Drug Enforcement Administration 21 CFR Part 1306 [Docket No. DEA-637] RIN [id number] "Transfer of Electronic Prescriptions for Schedules II-V Controlled Substances Between Pharmacies for Initial Filling"  ____________________________________________________________________________________________     _______________________________________________________________________  Medication Rules  Purpose: To inform patients, and their family members, of our medication rules and regulations.  Applies to: All patients receiving prescriptions from our practice (written or electronic).  Pharmacy of record: This is the pharmacy where your electronic prescriptions will be sent. Make sure we have the correct one.  Electronic prescriptions: In compliance with the [place] Strengthen Opioid Misuse Prevention (STOP) Act of 2017 (Session Law 2017-74/H243), effective July 19, 2018, all controlled substances must be electronically prescribed. Written prescriptions, faxing, or calling prescriptions to a pharmacy will no longer be done.  Prescription refills: These will be provided only during in-person appointments. No medications will be renewed without a "face-to-face" evaluation with your provider. Applies to all prescriptions.  NOTE: The following applies primarily to controlled substances (Opioid* Pain Medications).   Type of encounter (visit): For patients receiving controlled substances, face-to-face visits are required. (Not an option and not up to the patient.)  Patient's responsibilities: Pain Pills: Bring all pain pills to every appointment (except for procedure appointments). Pill Bottles: Bring  pills in original pharmacy bottle. Bring bottle, even if empty. Always bring the bottle of the most recent fill.  Medication refills: You are responsible for knowing and keeping track of what medications you are taking and when is it that you will need a refill. The day before your appointment: write a list of all prescriptions that need to be refilled. The day of the appointment: give the list to the admitting nurse. Prescriptions will be written only during appointments. No prescriptions will be written on procedure days. If you forget a medication: it will not be "Called in", "Faxed", or "electronically sent". You will need to get another appointment to get these prescribed. No early refills. Do not call asking to have your prescription filled early. Partial  or short prescriptions: Occasionally your pharmacy may not have enough pills to fill your prescription.  NEVER ACCEPT a partial fill or a prescription that is short of the total amount of pills that you were prescribed.  With controlled substances the law allows 72 hours for the pharmacy to complete the prescription.  If the prescription is not completed within 72 hours, the pharmacist will require a new prescription to be written. This means that you will be short on your medicine and we WILL NOT send another prescription to complete your original prescription.  Instead, request the pharmacy to send a carrier to a nearby branch to get enough medication to provide you with your full prescription. Prescription Accuracy: You are responsible for carefully inspecting your   prescriptions before leaving our office. Have the discharge nurse carefully go over each prescription with you, before taking them home. Make sure that your name is accurately spelled, that your address is correct. Check the name and dose of your medication to make sure it is accurate. Check the number of pills, and the written instructions to make sure they are clear and accurate. Make  sure that you are given enough medication to last until your next medication refill appointment. Taking Medication: Take medication as prescribed. When it comes to controlled substances, taking less pills or less frequently than prescribed is permitted and encouraged. Never take more pills than instructed. Never take the medication more frequently than prescribed.  Inform other Doctors: Always inform, all of your healthcare providers, of all the medications you take. Pain Medication from other Providers: You are not allowed to accept any additional pain medication from any other Doctor or Healthcare provider. There are two exceptions to this rule. (see below) In the event that you require additional pain medication, you are responsible for notifying us, as stated below. Cough Medicine: Often these contain an opioid, such as codeine or hydrocodone. Never accept or take cough medicine containing these opioids if you are already taking an opioid* medication. The combination may cause respiratory failure and death. Medication Agreement: You are responsible for carefully reading and following our Medication Agreement. This must be signed before receiving any prescriptions from our practice. Safely store a copy of your signed Agreement. Violations to the Agreement will result in no further prescriptions. (Additional copies of our Medication Agreement are available upon request.) Laws, Rules, & Regulations: All patients are expected to follow all Federal and State Laws, Statutes, Rules, & Regulations. Ignorance of the Laws does not constitute a valid excuse.  Illegal drugs and Controlled Substances: The use of illegal substances (including, but not limited to marijuana and its derivatives) and/or the illegal use of any controlled substances is strictly prohibited. Violation of this rule may result in the immediate and permanent discontinuation of any and all prescriptions being written by our practice. The use of  any illegal substances is prohibited. Adopted CDC guidelines & recommendations: Target dosing levels will be at or below 60 MME/day. Use of benzodiazepines** is not recommended.  Exceptions: There are only two exceptions to the rule of not receiving pain medications from other Healthcare Providers. Exception #1 (Emergencies): In the event of an emergency (i.e.: accident requiring emergency care), you are allowed to receive additional pain medication. However, you are responsible for: As soon as you are able, call our office [phone], at any time of the day or night, and leave a message stating your name, the date and nature of the emergency, and the name and dose of the medication prescribed. In the event that your call is answered by a member of our staff, make sure to document and save the date, time, and the name of the person that took your information.  Exception #2 (Planned Surgery): In the event that you are scheduled by another doctor or dentist to have any type of surgery or procedure, you are allowed (for a period no longer than 30 days), to receive additional pain medication, for the acute post-op pain. However, in this case, you are responsible for picking up a copy of our "Post-op Pain Management for Surgeons" handout, and giving it to your surgeon or dentist. This document is available at our office, and does not require an appointment to obtain it. Simply go to   our office during business hours (Monday-Thursday from 8:00 AM to 4:00 PM) (Friday 8:00 AM to 12:00 Noon) or if you have a scheduled appointment with us, prior to your surgery, and ask for it by name. In addition, you are responsible for: calling our office [phone], at any time of the day or night, and leaving a message stating your name, name of your surgeon, type of surgery, and date of procedure or surgery. Failure to comply with your responsibilities may result in termination of therapy involving the controlled  substances. Medication Agreement Violation. Following the above rules, including your responsibilities will help you in avoiding a Medication Agreement Violation ("Breaking your Pain Medication Contract").  Consequences:  Not following the above rules may result in permanent discontinuation of medication prescription therapy.  *Opioid medications include: morphine, codeine, oxycodone, oxymorphone, hydrocodone, hydromorphone, meperidine, tramadol, tapentadol, buprenorphine, fentanyl, methadone. **Benzodiazepine medications include: diazepam (Valium), alprazolam (Xanax), clonazepam (Klonopine), lorazepam (Ativan), clorazepate (Tranxene), chlordiazepoxide (Librium), estazolam (Prosom), oxazepam (Serax), temazepam (Restoril), triazolam (Halcion) (Last updated: 05/11/2022) ______________________________________________________________________    ______________________________________________________________________  Medication Recommendations and Reminders  Applies to: All patients receiving prescriptions (written and/or electronic).  Medication Rules & Regulations: You are responsible for reading, knowing, and following our "Medication Rules" document. These exist for your safety and that of others. They are not flexible and neither are we. Dismissing or ignoring them is an act of "non-compliance" that may result in complete and irreversible termination of such medication therapy. For safety reasons, "non-compliance" will not be tolerated. As with the [place] fundamental legal principle of "ignorance of the law is no defense", we will accept no excuses for not having read and knowing the content of documents provided to you by our practice.  Pharmacy of record:  Definition: This is the pharmacy where your electronic prescriptions will be sent.  We do not endorse any particular pharmacy. It is up to you and your insurance to decide what pharmacy to use.  We do not restrict you in your choice of  pharmacy. However, once we write for your prescriptions, we will NOT be re-sending more prescriptions to fix restricted supply problems created by your pharmacy, or your insurance.  The pharmacy listed in the electronic medical record should be the one where you want electronic prescriptions to be sent. If you choose to change pharmacy, simply notify our nursing staff. Changes will be made only during your regular appointments and not over the phone.  Recommendations: Keep all of your pain medications in a safe place, under lock and key, even if you live alone. We will NOT replace lost, stolen, or damaged medication. We do not accept "Police Reports" as proof of medications having been stolen. After you fill your prescription, take 1 week's worth of pills and put them away in a safe place. You should keep a separate, properly labeled bottle for this purpose. The remainder should be kept in the original bottle. Use this as your primary supply, until it runs out. Once it's gone, then you know that you have 1 week's worth of medicine, and it is time to come in for a prescription refill. If you do this correctly, it is unlikely that you will ever run out of medicine. To make sure that the above recommendation works, it is very important that you make sure your medication refill appointments are scheduled at least 1 week before you run out of medicine. To do this in an effective manner, make sure that you do not leave the office without   scheduling your next medication management appointment. Always ask the nursing staff to show you in your prescription , when your medication will be running out. Then arrange for the receptionist to get you a return appointment, at least 7 days before you run out of medicine. Do not wait until you have 1 or 2 pills left, to come in. This is very poor planning and does not take into consideration that we may need to cancel appointments due to bad weather, sickness, or emergencies  affecting our staff. DO NOT ACCEPT A "Partial Fill": If for any reason your pharmacy does not have enough pills/tablets to completely fill or refill your prescription, do not allow for a "partial fill". The law allows the pharmacy to complete that prescription within 72 hours, without requiring a new prescription. If they do not fill the rest of your prescription within those 72 hours, you will need a separate prescription to fill the remaining amount, which we will NOT provide. If the reason for the partial fill is your insurance, you will need to talk to the pharmacist about payment alternatives for the remaining tablets, but again, DO NOT ACCEPT A PARTIAL FILL, unless you can trust your pharmacist to obtain the remainder of the pills within 72 hours.  Prescription refills and/or changes in medication(s):  Prescription refills, and/or changes in dose or medication, will be conducted only during scheduled medication management appointments. (Applies to both, written and electronic prescriptions.) No refills on procedure days. No medication will be changed or started on procedure days. No changes, adjustments, and/or refills will be conducted on a procedure day. Doing so will interfere with the diagnostic portion of the procedure. No phone refills. No medications will be "called into the pharmacy". No Fax refills. No weekend refills. No Holliday refills. No after hours refills.  Remember:  Business hours are:  Monday to Thursday 8:00 AM to 4:00 PM Provider's Schedule: Baptiste Littler, MD - Appointments are:  Medication management: Monday and Wednesday 8:00 AM to 4:00 PM Procedure day: Tuesday and Thursday 7:30 AM to 4:00 PM Bilal Lateef, MD - Appointments are:  Medication management: Tuesday and Thursday 8:00 AM to 4:00 PM Procedure day: Monday and Wednesday 7:30 AM to 4:00 PM (Last update: 05/11/2022) ______________________________________________________________________     ____________________________________________________________________________________________  Drug Holidays  What is a "Drug Holiday"? Drug Holiday: is the name given to the process of slowly tapering down and temporarily stopping the pain medication for the purpose of decreasing or eliminating tolerance to the drug.  Benefits Improved effectiveness Decreased required effective dose Improved pain control End dependence on high dose therapy Decrease cost of therapy Uncovering "opioid-induced hyperalgesia". (OIH)  What is "opioid hyperalgesia"? It is a paradoxical increase in pain caused by exposure to opioids. Stopping the opioid pain medication, contrary to the expected, it actually decreases or completely eliminates the pain. Ref.: "A comprehensive review of opioid-induced hyperalgesia". Marion Lee, et.al. Pain Physician. 2011 Mar-Apr;14(2):145-61.  What is tolerance? Tolerance: the progressive loss of effectiveness of a pain medicine due to repetitive use. A common problem of opioid pain medications.  How long should a "Drug Holiday" last? Effectiveness depends on the patient staying off all opioid pain medicines for a minimum of 14 consecutive days. (2 weeks)  How about just taking less of the medicine? Does not work. Will not accomplish goal of eliminating the excess receptors.  How about switching to a different pain medicine? (AKA. "Opioid rotation") Does not work. Creates the illusion of effectiveness by taking advantage of   inaccurate equivalent dose calculations between different opioids. -This "technique" was promoted by studies funded by pharmaceutical companies, such as [employer], creators of "OxyContin".  Can I stop the medicine "cold turkey"? We do not recommend it. You should always coordinate with your prescribing physician to make the transition as smoothly as possible. Avoid stopping the medicine abruptly without consulting. We recommend a "slow taper".  What  is a slow taper? Taper: refers to the gradual decrease in dose.   How do I stop/taper the dose? Slowly. Decrease the daily amount of pills that you take by one (1) pill every seven (7) days. This is called a "slow downward taper". Example: if you normally take four (4) pills per day, drop it to three (3) pills per day for seven (7) days, then to two (2) pills per day for seven (7) days, then to one (1) per day for seven (7) days, and then stop the medicine. The 14 day "Drug Holiday" starts on the first day without medicine.   Will I experience withdrawals? Unlikely with a slow taper.  What triggers withdrawals? Withdrawals are triggered by the sudden/abrupt stop of high dose opioids. Withdrawals can be avoided by slowly decreasing the dose over a prolonged period of time.  What are withdrawals? Symptoms associated with sudden/abrupt reduction/stopping of high-dose, long-term use of pain medication. Withdrawal are seldom seen on low dose therapy, or patients rarely taking opioid medication.  Early Withdrawal Symptoms may include: Agitation Anxiety Muscle aches Increased tearing Insomnia Runny nose Sweating Yawning  Late symptoms may include: Abdominal cramping Diarrhea Dilated pupils Goose bumps Nausea Vomiting  When could I see withdrawals? Onset: 8-24 hours after last use for most opioids. 12-48 hours for long-acting opioids (i.e.: methadone)  How long could they last? Duration: 4-10 days for most opioids. 14-21 days for long-acting opioids (i.e.: methadone)  What will happen after I complete my "Drug Holiday"? The need and indications for the opioid analgesic will be reviewed before restarting the medication. Dose requirements will likely decrease and the dose will need to be adjusted accordingly.   (Last update: 10/06/2022) ____________________________________________________________________________________________    ____________________________________________________________________________________________  Naloxone Nasal Spray  Why am I receiving this medication? [place] STOP ACT requires that all patients taking high dose opioids or at risk of opioids respiratory depression, be prescribed an opioid reversal agent, such as Naloxone (AKA: Narcan).  What is this medication? NALOXONE (nal OX one) treats opioid overdose, which causes slow or shallow breathing, severe drowsiness, or trouble staying awake. Call emergency services after using this medication. You may need additional treatment. Naloxone works by reversing the effects of opioids. It belongs to a group of medications called opioid blockers.  COMMON BRAND NAME(S): Kloxxado, Narcan  What should I tell my care team before I take this medication? They need to know if you have any of these conditions: Heart disease Substance use disorder An unusual or allergic reaction to naloxone, other medications, foods, dyes, or preservatives Pregnant or trying to get pregnant Breast-feeding  When to use this medication? This medication is to be used for the treatment of respiratory depression (less than 8 breaths per minute) secondary to opioid overdose.   How to use this medication? This medication is for use in the nose. Lay the person on their back. Support their neck with your hand and allow the head to tilt back before giving the medication. The nasal spray should be given into 1 nostril. After giving the medication, move the person onto their side.   Do not remove or test the nasal spray until ready to use. Get emergency medical help right away after giving the first dose of this medication, even if the person wakes up. You should be familiar with how to recognize the signs and symptoms of a narcotic overdose. If more doses are needed, give the additional dose in the other nostril. Talk to your care team about the use of this medication in children.  While this medication may be prescribed for children as young as newborns for selected conditions, precautions do apply.  Naloxone Overdosage: If you think you have taken too much of this medicine contact a poison control center or emergency room at once.  NOTE: This medicine is only for you. Do not share this medicine with others.  What if I miss a dose? This does not apply.  What may interact with this medication? This is only used during an emergency. No interactions are expected during emergency use. This list may not describe all possible interactions. Give your health care provider a list of all the medicines, herbs, non-prescription drugs, or dietary supplements you use. Also tell them if you smoke, drink alcohol, or use illegal drugs. Some items may interact with your medicine.  What should I watch for while using this medication? Keep this medication ready for use in the case of an opioid overdose. Make sure that you have the phone number of your care team and local hospital ready. You may need to have additional doses of this medication. Each nasal spray contains a single dose. Some emergencies may require additional doses. After use, bring the treated person to the nearest hospital or call 911. Make sure the treating care team knows that the person has received a dose of this medication. You will receive additional instructions on what to do during and after use of this medication before an emergency occurs.  What side effects may I notice from receiving this medication? Side effects that you should report to your care team as soon as possible: Allergic reactions--skin rash, itching, hives, swelling of the face, lips, tongue, or throat Side effects that usually do not require medical attention (report these to your care team if they continue or are bothersome): Constipation Dryness or irritation inside the nose Headache Increase in blood pressure Muscle spasms Stuffy  nose Toothache This list may not describe all possible side effects. Call your doctor for medical advice about side effects. You may report side effects to FDA at 1-800-FDA-1088.  Where should I keep my medication? Because this is an emergency medication, you should keep it with you at all times.  Keep out of the reach of children and pets. Store between 20 and 25 degrees C (68 and 77 degrees F). Do not freeze. Throw away any unused medication after the expiration date. Keep in original box until ready to use.  NOTE: This sheet is a summary. It may not cover all possible information. If you have questions about this medicine, talk to your doctor, pharmacist, or health care provider.   2023 [employer]/Gold Standard (2021-03-13 00:00:00)  ____________________________________________________________________________________________   

## 2023-01-23 NOTE — Progress Notes (Unsigned)
PROVIDER NOTE: Information contained herein reflects review and annotations entered in association with encounter. Interpretation of such information and data should be left to medically-trained personnel. Information provided to patient can be located elsewhere in the medical record under "Patient Instructions". Document created using STT-dictation technology, any transcriptional errors that may result from process are unintentional.    Patient: Nathan Lambert.  Service Category: E/M  Provider: Oswaldo Done, MD  DOB: 1980/10/27  DOS: 01/24/2023  Referring Provider: No ref. provider found  MRN: [id number]  Specialty: Interventional Pain Management  PCP: Smith Robert, MD  Type: Established Patient  Setting: Ambulatory outpatient    Location: Office  Delivery: Face-to-face     HPI  Mr. Bernerd Jerez., a 42 y.o. year old male, is here today because of his No primary diagnosis found.. Mr. Yurek primary complain today is No chief complaint on file.  Pertinent problems: Mr. 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); Chronic lower extremity pain (3ry area of Pain) (Bilateral) (L>R); Lumbar facet hypertrophy (Bilateral); Lumbar facet syndrome (Bilateral) (R>L); Neurogenic pain; Spondylosis without myelopathy or radiculopathy, lumbar region; Other specified dorsopathies, sacral and sacrococcygeal region; Lumbar spondylosis; Chronic upper extremity pain (Left); Pain and numbness of left upper extremity; Cervical radiculitis (C6/C7) (Left); DDD (degenerative disc disease),  cervical; Cervicalgia; Osteoarthritis involving multiple joints; Chronic hip pain (4th area of Pain) (Bilateral) (L>R); Greater trochanteric bursitis (Left); Lumbar back pain with radiculopathy affecting left lower extremity (L5 dermatomal distribution); Subacute lumbar radiculopathy (L5) (Left); Chronic low back pain (1ry area of Pain) (Bilateral) w/o sciatica; Decreased range of motion of both hips; Chronic hip pain (Left); Enthesopathy of hip region (Left); Other bursitis of hip (Left); Chronic groin pain (Left); Headache disorder; Cervical radiculopathy; and Lumbar radiculopathy on their pertinent problem list. Pain Assessment: Severity of   is reported as a  /10. Location:    / . Onset:  . Quality:  . Timing:  . Modifying factor(s):  Marland Kitchen Vitals:  vitals were not taken for this visit.  BMI: Estimated body mass index is 37.66 kg/m as calculated from the following:   Height as of 10/27/22: 5\' 11"  (1.803 m).   Weight as of 10/27/22: 270 lb (122.5 kg). Last encounter: 10/27/2022. Last procedure: Visit date not found.  Reason for encounter: medication management. ***  Routine UDS ordered today.   RTCB:   Pharmacotherapy Assessment  Analgesic: Oxycodone IR 10 mg 4 times daily. MME/day: 60 mg/day.   Monitoring: [place] PMP: PDMP reviewed during this encounter.       Pharmacotherapy: No side-effects or adverse reactions reported. Compliance: No problems identified. Effectiveness: Clinically acceptable.  No notes on file  No results found for: "CBDTHCR" No results found for: "D8THCCBX" No results found for: "D9THCCBX"  UDS:  Summary  Date Value Ref Range Status  01/27/2022 Note  Final    Comment:    ==================================================================== [facility] (MW) ==================================================================== Test                             Result       Flag       Units  Drug Present and Declared  for Prescription Verification   Oxycodone                       766          EXPECTED   ng/mg creat   Oxymorphone                    1593         EXPECTED   ng/mg creat   Noroxycodone                   592          EXPECTED   ng/mg creat   Noroxymorphone                 304          EXPECTED   ng/mg creat    Sources of oxycodone are scheduled prescription medications.    Oxymorphone, noroxycodone, and noroxymorphone are expected    metabolites of oxycodone. Oxymorphone is also available as a    scheduled prescription medication.  ==================================================================== Test                      Result    Flag   Units      Ref Range   Creatinine              178              mg/dL      >=02 ==================================================================== Declared Medications:  The flagging and interpretation on this report are based on the  following declared medications.  Unexpected results may arise from  inaccuracies in the declared medications.   **Note: The testing scope of this panel includes these medications:   Oxycodone   **Note: The testing scope of this panel does not include the  following reported medications:   Azelastine (Astelin)  Clobetasol (Temovate)  Econazole  Famotidine (Pepcid)  Fluticasone (Flonase)  Gentamicin  Glipizide (Glucotrol)  Loratadine (Claritin)  Magnesium  Montelukast (Singulair)  Naloxone (Narcan)  Terbinafine (Lamisil)  Testosterone ==================================================================== For clinical consultation, please call [phone]. ====================================================================       ROS  Constitutional: Denies any fever or chills Gastrointestinal: No reported hemesis, hematochezia, vomiting, or acute GI distress Musculoskeletal: Denies any acute onset joint swelling, redness, loss of ROM, or weakness Neurological: No reported episodes of acute onset apraxia, aphasia, dysarthria, agnosia, amnesia,  paralysis, loss of coordination, or loss of consciousness  Medication Review  Magnesium, Magnesium Oxide -Mg Supplement, Oxycodone HCl, azelastine, clobetasol ointment, doxycycline, econazole nitrate, famotidine, fluticasone, gentamicin cream, glipiZIDE, loratadine, montelukast, naloxone, terbinafine, and testosterone cypionate  History Review  Allergy: Mr. Kromah is allergic to bactrim [sulfamethoxazole-trimethoprim], pollen extract, sulfa antibiotics, and penicillins. Drug: Mr. Dorame  reports no history of drug use. Alcohol:  reports no history of alcohol use. Tobacco:  reports that he has been smoking cigarettes. He has a 10.00 pack-year smoking history. He has never used smokeless tobacco. Social: Mr. Leconte  reports that he has been smoking cigarettes. He has a 10.00 pack-year smoking history. He has never used smokeless tobacco. He reports that he does not drink alcohol and does not use drugs. Medical:  has a past medical history of Allergy (2012), Chronic low back pain (Primary Area of Pain) (Bilateral) (R>L), Degeneration of lumbar or lumbosacral intervertebral disc (L4-L5) (12/27/2016), Gastroesophageal reflux disease without esophagitis (10/26/2021), Headache, Mixed hyperlipidemia (10/26/2021), and Opiate use (02/15/2017). Surgical: Mr. Nygard  has  a past surgical history that includes Fracture surgery (Left); Spine surgery (Right, 06/16/2015); and Back surgery. Family: family history includes AAA (abdominal aortic aneurysm) in his mother; Diabetes in his father; Stroke in his mother.  Laboratory Chemistry Profile   Renal Lab Results  Component Value Date   BUN 9 05/23/2022   CREATININE 0.76 05/23/2022   BCR 7 (L) 12/27/2016   GFRAA >60 06/07/2019   GFRNONAA >60 05/23/2022    Hepatic Lab Results  Component Value Date   AST 16 05/23/2022   ALT 12 05/23/2022   ALBUMIN 4.0 05/23/2022   ALKPHOS 51 05/23/2022    Electrolytes Lab Results  Component Value Date    NA 140 05/23/2022   K 3.9 05/23/2022   CL 107 05/23/2022   CALCIUM 9.1 05/23/2022   MG 2.0 06/07/2019    Bone Lab Results  Component Value Date   VD25OH 15.51 (L) 06/07/2019   25OHVITD1 17 (L) 12/27/2016   25OHVITD2 <1.0 12/27/2016   25OHVITD3 17 12/27/2016   TESTOFREE 2.6 (L) 06/08/2017   TESTOSTERONE 116 (L) 06/08/2017    Inflammation (CRP: Acute Phase) (ESR: Chronic Phase) Lab Results  Component Value Date   CRP 2.7 (H) 06/07/2019   ESRSEDRATE 17 (H) 06/07/2019         Note: Above Lab results reviewed.  Recent Imaging Review  CT PELVIS W CONTRAST CLINICAL DATA:  Patient complains of abscess on his left testicle  EXAM: CT PELVIS WITH CONTRAST  TECHNIQUE: Multidetector CT imaging of the pelvis was performed using the standard protocol following the bolus administration of intravenous contrast.  RADIATION DOSE REDUCTION: This exam was performed according to the departmental dose-optimization program which includes automated exposure control, adjustment of the mA and/or kV according to patient size and/or use of iterative reconstruction technique.  CONTRAST:  OMNIPAQUE IOHEXOL 300 MG/ML  SOLN  COMPARISON:  None Available.  FINDINGS: Urinary Tract:  No abnormality visualized.  Bowel:  Unremarkable visualized pelvic bowel loops.  Vascular/Lymphatic: Enlarged bilateral inguinal lymph nodes measuring up to 1.8 cm on the left and 1.7 cm on the right.  Reproductive:  Bilateral hydroceles.  Other: Edema within the scrotum extending into the peroneal subcutaneous fat. No subcutaneous gas.  Musculoskeletal: No suspicious bone lesions identified.  IMPRESSION: Bilateral varicoceles.  Nonspecific soft tissue edema within the scrotal wall extending into the perineum subcutaneous fat. No subcutaneous gas. No drainable fluid collection.  Enlarged bilateral inguinal nodes likely reactive given history of scrotal abscesses.  Electronically Signed   By:  Minerva Fester M.D.   On: 05/24/2022 00:35 Note: Reviewed        Physical Exam  General appearance: Well nourished, well developed, and well hydrated. In no apparent acute distress Mental status: Alert, oriented x 3 (person, place, & time)       Respiratory: No evidence of acute respiratory distress Eyes: PERLA Vitals: There were no vitals taken for this visit. BMI: Estimated body mass index is 37.66 kg/m as calculated from the following:   Height as of 10/27/22: 5\' 11"  (1.803 m).   Weight as of 10/27/22: 270 lb (122.5 kg). Ideal: Patient weight not recorded  Assessment   Diagnosis Status  No diagnosis found. Controlled Controlled Controlled   Updated Problems: No problems updated.  Plan of Care  Problem-specific:  No problem-specific Assessment & Plan notes found for this encounter.  Mr. Priyansh Trayer. has a current medication list which includes the following long-term medication(s): azelastine, famotidine, fluticasone, glipizide, loratadine, magnesium, magnesium oxide -  mg supplement, montelukast, naloxone, oxycodone hcl, oxycodone hcl, oxycodone hcl, and oxycodone hcl.  Pharmacotherapy (Medications Ordered): No orders of the defined types were placed in this encounter.  Orders:  No orders of the defined types were placed in this encounter.  Follow-up plan:   No follow-ups on file.      Interventional Therapies  Risk Factors  Considerations:   [place]  GERD  Tobacco abuse  Hypogonadonism     Planned  Pending:      Under consideration:   Possible left SI joint RFA Diagnostic caudal ESI + diagnostic epidurogram  Possible Racz procedure  Diagnostic left IA knee injection (w/ steroid)  Possible left IA Hyalgan knee injections  Diagnostic left Genicular NB  Possible left Genicular nerve RFA    Completed:   Diagnostic/therapeutic left IA hip joint + bursa injection x1 (11/25/2020) (100/100/0/0)  Therapeutic right lumbar facet MBB x3 (05/15/2020)  (100/100/50/50)  Therapeutic left lumbar facet MBB x3 (05/15/2020) 100/100/50/50)  Therapeutic right lumbar facet RFA x1 (10/11/2017) NR Therapeutic left lumbar facet RFA x1 (11/24/2017) NR Therapeutic right SI joint block x1 (05/19/2017)  Therapeutic left SI joint block x1 (05/19/2017)  Therapeutic right SI joint RFA x1 (10/11/2017)  Therapeutic right L4 TFES2 x1 (05/30/2018)    Therapeutic  Palliative (PRN) options:   Palliative bilateral lumbar facet block #4 Palliative bilateral SI joint block #2  Diagnostic right L4 TFES2 #2    Pharmacotherapy  Nonopioids transferred 05/07/2020: Magnesium, vitamin D3, and calcium        Recent Visits Date Type Provider Dept  10/27/22 Office Visit Delano Metz, MD [facility]  Showing recent visits within past 90 days and meeting all other requirements Future Appointments Date Type Provider Dept  01/24/23 Appointment Delano Metz, MD [facility]  Showing future appointments within next 90 days and meeting all other requirements  I discussed the assessment and treatment plan with the patient. The patient was provided an opportunity to ask questions and all were answered. The patient agreed with the plan and demonstrated an understanding of the instructions.  Patient advised to call back or seek an in-person evaluation if the symptoms or condition worsens.  Duration of encounter: *** minutes.  Total time on encounter, as per AMA guidelines included both the face-to-face and non-face-to-face time personally spent by the physician and/or other qualified health care professional(s) on the day of the encounter (includes time in activities that require the physician or other qualified health care professional and does not include time in activities normally performed by clinical staff). Physician's time may include the following activities when performed: Preparing to see the patient (e.g., pre-charting review of records,  searching for previously ordered imaging, lab work, and nerve conduction tests) Review of prior analgesic pharmacotherapies. Reviewing PMP Interpreting ordered tests (e.g., lab work, imaging, nerve conduction tests) Performing post-procedure evaluations, including interpretation of diagnostic procedures Obtaining and/or reviewing separately obtained history Performing a medically appropriate examination and/or evaluation Counseling and educating the patient/family/caregiver Ordering medications, tests, or procedures Referring and communicating with other health care professionals (when not separately reported) Documenting clinical information in the electronic or other health record Independently interpreting results (not separately reported) and communicating results to the patient/ family/caregiver Care coordination (not separately reported)  Note by: Oswaldo Done, MD Date: 01/24/2023; Time: 9:21 AM

## 2023-01-24 ENCOUNTER — Ambulatory Visit: Payer: BLUE CROSS/BLUE SHIELD | Attending: Pain Medicine | Admitting: Pain Medicine

## 2023-01-24 ENCOUNTER — Encounter: Payer: Self-pay | Admitting: Pain Medicine

## 2023-01-24 VITALS — BP 137/86 | HR 71 | Temp 97.2°F | Resp 18 | Ht 71.0 in | Wt 280.0 lb

## 2023-01-24 DIAGNOSIS — M961 Postlaminectomy syndrome, not elsewhere classified: Secondary | ICD-10-CM

## 2023-01-24 DIAGNOSIS — M545 Low back pain, unspecified: Secondary | ICD-10-CM | POA: Diagnosis present

## 2023-01-24 DIAGNOSIS — M47816 Spondylosis without myelopathy or radiculopathy, lumbar region: Secondary | ICD-10-CM

## 2023-01-24 DIAGNOSIS — M79605 Pain in left leg: Secondary | ICD-10-CM | POA: Insufficient documentation

## 2023-01-24 DIAGNOSIS — M159 Polyosteoarthritis, unspecified: Secondary | ICD-10-CM | POA: Insufficient documentation

## 2023-01-24 DIAGNOSIS — M25552 Pain in left hip: Secondary | ICD-10-CM | POA: Insufficient documentation

## 2023-01-24 DIAGNOSIS — M79604 Pain in right leg: Secondary | ICD-10-CM | POA: Insufficient documentation

## 2023-01-24 DIAGNOSIS — Z79899 Other long term (current) drug therapy: Secondary | ICD-10-CM | POA: Diagnosis present

## 2023-01-24 DIAGNOSIS — M150 Primary generalized (osteo)arthritis: Secondary | ICD-10-CM

## 2023-01-24 DIAGNOSIS — Z79891 Long term (current) use of opiate analgesic: Secondary | ICD-10-CM | POA: Diagnosis present

## 2023-01-24 DIAGNOSIS — G8929 Other chronic pain: Secondary | ICD-10-CM | POA: Diagnosis present

## 2023-01-24 DIAGNOSIS — M25562 Pain in left knee: Secondary | ICD-10-CM | POA: Insufficient documentation

## 2023-01-24 DIAGNOSIS — M25551 Pain in right hip: Secondary | ICD-10-CM | POA: Diagnosis present

## 2023-01-24 DIAGNOSIS — G894 Chronic pain syndrome: Secondary | ICD-10-CM | POA: Diagnosis not present

## 2023-01-24 MED ORDER — OXYCODONE HCL 10 MG PO TABS: 10.0000 mg | ORAL_TABLET | Freq: Four times a day (QID) | ORAL | 0 refills | Status: DC | PRN

## 2023-01-24 NOTE — Progress Notes (Signed)
Nursing Pain Medication Assessment:  Safety precautions to be maintained throughout the outpatient stay will include: orient to surroundings, keep bed in low position, maintain call bell within reach at all times, provide assistance with transfer out of bed and ambulation.  Medication Inspection Compliance: Pill count conducted under aseptic conditions, in front of the patient. Neither the pills nor the bottle was removed from the patient's sight at any time. Once count was completed pills were immediately returned to the patient in their original bottle.  Medication: Oxycodone IR Pill/Patch Count:  25 of 120 pills remain Pill/Patch Appearance: Markings consistent with prescribed medication Bottle Appearance: Standard pharmacy container. Clearly labeled. Filled Date: 06 / 15 / 2024 Last Medication intake:  Today

## 2023-01-26 LAB — TOXASSURE SELECT 13 (MW), URINE

## 2023-01-31 DIAGNOSIS — M5459 Other low back pain: Secondary | ICD-10-CM | POA: Insufficient documentation

## 2023-01-31 NOTE — Patient Instructions (Signed)

## 2023-01-31 NOTE — Progress Notes (Unsigned)
PROVIDER NOTE: Interpretation of information contained herein should be left to medically-trained personnel. Specific patient instructions are provided elsewhere under "Patient Instructions" section of medical record. This document was created in part using STT-dictation technology, any transcriptional errors that may result from this process are unintentional.  Patient: Nathan Lambert. Type: Established DOB: Apr 02, 1981 MRN: [id number] PCP: Smith Robert, MD  Service: Procedure DOS: 02/01/2023 Setting: Ambulatory Location: Ambulatory outpatient facility Delivery: Face-to-face Provider: Oswaldo Done, MD Specialty: Interventional Pain Management Specialty designation: 09 Location: Outpatient facility Ref. Prov.: Smith Robert, MD       Interventional Therapy   Procedure: Lumbar Facet, Medial Branch Block(s) #4  Laterality: Right  Level: T12, L1, L2, L3, L4, L5, and S1 Medial Branch Level(s). Injecting these levels blocks the L1-2, L2-3, L3-4, L4-5, and L5-S1 lumbar facet joints. Imaging: Fluoroscopic guidance         Anesthesia: Local anesthesia (1-2% Lidocaine) Anxiolysis: IV Versed         Sedation:                         DOS: 02/01/2023 Performed by: Oswaldo Done, MD  Primary Purpose: Diagnostic/Therapeutic Indications: Low back pain severe enough to impact quality of life or function. 1. Chronic low back pain (1ry area of Pain) (Bilateral) w/o sciatica   2. Lumbar facet syndrome (Bilateral) (R>L)   3. Lumbar facet hypertrophy (Bilateral)   4. Lumbar facet joint pain   5. Spondylosis without myelopathy or radiculopathy, lumbar region    NAS-11 Pain score:   Pre-procedure:  /10   Post-procedure:  /10     Position / Prep / Materials:  Position: Prone  Prep solution: DuraPrep (Iodine Povacrylex [0.7% available iodine] and Isopropyl Alcohol, 74% w/w) Area Prepped: Posterolateral Lumbosacral Spine (Wide prep: From the lower border of the scapula down to  the end of the tailbone and from flank to flank.)  Materials:  Tray: Block Needle(s):  Type: Spinal  Gauge (G): 22  Length: 5-in Qty: 4      H&P (Pre-op Assessment):  Nathan Lambert is a 42 y.o. (year old), male patient, seen today for interventional treatment. He  has a past surgical history that includes Fracture surgery (Left); Spine surgery (Right, 06/16/2015); and Back surgery. Nathan Lambert has a current medication list which includes the following prescription(s): azelastine, clobetasol ointment, doxycycline, econazole nitrate, famotidine, fluticasone, gentamicin cream, glipizide, loratadine, magnesium, magnesium oxide -mg supplement, montelukast, naloxone, oxycodone hcl, [START ON 02/26/2023] oxycodone hcl, [START ON 03/28/2023] oxycodone hcl, terbinafine, and testosterone cypionate. His primarily concern today is the No chief complaint on file.  Initial Vital Signs:  Pulse/HCG Rate:    Temp:   Resp:   BP:   SpO2:    BMI: Estimated body mass index is 39.05 kg/m as calculated from the following:   Height as of 01/24/23: 5\' 11"  (1.803 m).   Weight as of 01/24/23: 280 lb (127 kg).  Risk Assessment: Allergies: Reviewed. He is allergic to bactrim [sulfamethoxazole-trimethoprim], pollen extract, sulfa antibiotics, and penicillins.  Allergy Precautions: None required Coagulopathies: Reviewed. None identified.  Blood-thinner therapy: None at this time Active Infection(s): Reviewed. None identified. Nathan Lambert is afebrile  Site Confirmation: Nathan Lambert was asked to confirm the procedure and laterality before marking the site Procedure checklist: Completed Consent: Before the procedure and under the influence of no sedative(s), amnesic(s), or anxiolytics, the patient was informed of the treatment options, risks and possible complications. To fulfill our ethical and  legal obligations, as recommended by the American Medical Association's Code of Ethics, I have informed the  patient of my clinical impression; the nature and purpose of the treatment or procedure; the risks, benefits, and possible complications of the intervention; the alternatives, including doing nothing; the risk(s) and benefit(s) of the alternative treatment(s) or procedure(s); and the risk(s) and benefit(s) of doing nothing. The patient was provided information about the general risks and possible complications associated with the procedure. These may include, but are not limited to: failure to achieve desired goals, infection, bleeding, organ or nerve damage, allergic reactions, paralysis, and death. In addition, the patient was informed of those risks and complications associated to Spine-related procedures, such as failure to decrease pain; infection (i.e.: Meningitis, epidural or intraspinal abscess); bleeding (i.e.: epidural hematoma, subarachnoid hemorrhage, or any other type of intraspinal or peri-dural bleeding); organ or nerve damage (i.e.: Any type of peripheral nerve, nerve root, or spinal cord injury) with subsequent damage to sensory, motor, and/or autonomic systems, resulting in permanent pain, numbness, and/or weakness of one or several areas of the body; allergic reactions; (i.e.: anaphylactic reaction); and/or death. Furthermore, the patient was informed of those risks and complications associated with the medications. These include, but are not limited to: allergic reactions (i.e.: anaphylactic or anaphylactoid reaction(s)); adrenal axis suppression; blood sugar elevation that in diabetics may result in ketoacidosis or comma; water retention that in patients with history of congestive heart failure may result in shortness of breath, pulmonary edema, and decompensation with resultant heart failure; weight gain; swelling or edema; medication-induced neural toxicity; particulate matter embolism and blood vessel occlusion with resultant organ, and/or nervous system infarction; and/or aseptic necrosis  of one or more joints. Finally, the patient was informed that Medicine is not an exact science; therefore, there is also the possibility of unforeseen or unpredictable risks and/or possible complications that may result in a catastrophic outcome. The patient indicated having understood very clearly. We have given the patient no guarantees and we have made no promises. Enough time was given to the patient to ask questions, all of which were answered to the patient's satisfaction. Mr. Denomme has indicated that he wanted to continue with the procedure. Attestation: I, the ordering provider, attest that I have discussed with the patient the benefits, risks, side-effects, alternatives, likelihood of achieving goals, and potential problems during recovery for the procedure that I have provided informed consent. Date  Time: {CHL [facility] TIME CHOICES:[id number]}   Pre-Procedure Preparation:  Monitoring: As per clinic protocol. Respiration, ETCO2, SpO2, BP, heart rate and rhythm monitor placed and checked for adequate function Safety Precautions: Patient was assessed for positional comfort and pressure points before starting the procedure. Time-out: I initiated and conducted the "Time-out" before starting the procedure, as per protocol. The patient was asked to participate by confirming the accuracy of the "Time Out" information. Verification of the correct person, site, and procedure were performed and confirmed by me, the nursing staff, and the patient. "Time-out" conducted as per Joint Commission's Universal Protocol (UP.01.01.01). Time:   Start Time:   hrs.  Description of Procedure:          Laterality: (see above) Targeted Levels: (see above)  Safety Precautions: Aspiration looking for blood return was conducted prior to all injections. At no point did we inject any substances, as a needle was being advanced. Before injecting, the patient was told to immediately notify me if he was experiencing  any new onset of "ringing in the ears, or metallic taste in  the mouth". No attempts were made at seeking any paresthesias. Safe injection practices and needle disposal techniques used. Medications properly checked for expiration dates. SDV (single dose vial) medications used. After the completion of the procedure, all disposable equipment used was discarded in the proper designated medical waste containers. Local Anesthesia: Protocol guidelines were followed. The patient was positioned over the fluoroscopy table. The area was prepped in the usual manner. The time-out was completed. The target area was identified using fluoroscopy. A 12-in long, straight, sterile hemostat was used with fluoroscopic guidance to locate the targets for each level blocked. Once located, the skin was marked with an approved surgical skin marker. Once all sites were marked, the skin (epidermis, dermis, and hypodermis), as well as deeper tissues (fat, connective tissue and muscle) were infiltrated with a small amount of a short-acting local anesthetic, loaded on a 10cc syringe with a 25G, 1.5-in  Needle. An appropriate amount of time was allowed for local anesthetics to take effect before proceeding to the next step. Local Anesthetic: Lidocaine 2.0% The unused portion of the local anesthetic was discarded in the proper designated containers. Technical description of process:  L2 Medial Branch Nerve Block (MBB): The target area for the L2 medial branch is at the junction of the postero-lateral aspect of the superior articular process and the superior, posterior, and medial edge of the transverse process of L3. Under fluoroscopic guidance, a Quincke needle was inserted until contact was made with os over the superior postero-lateral aspect of the pedicular shadow (target area). After negative aspiration for blood, 0.5 mL of the nerve block solution was injected without difficulty or complication. The needle was removed intact. L3 Medial  Branch Nerve Block (MBB): The target area for the L3 medial branch is at the junction of the postero-lateral aspect of the superior articular process and the superior, posterior, and medial edge of the transverse process of L4. Under fluoroscopic guidance, a Quincke needle was inserted until contact was made with os over the superior postero-lateral aspect of the pedicular shadow (target area). After negative aspiration for blood, 0.5 mL of the nerve block solution was injected without difficulty or complication. The needle was removed intact. L4 Medial Branch Nerve Block (MBB): The target area for the L4 medial branch is at the junction of the postero-lateral aspect of the superior articular process and the superior, posterior, and medial edge of the transverse process of L5. Under fluoroscopic guidance, a Quincke needle was inserted until contact was made with os over the superior postero-lateral aspect of the pedicular shadow (target area). After negative aspiration for blood, 0.5 mL of the nerve block solution was injected without difficulty or complication. The needle was removed intact. L5 Medial Branch Nerve Block (MBB): The target area for the L5 medial branch is at the junction of the postero-lateral aspect of the superior articular process and the superior, posterior, and medial edge of the sacral ala. Under fluoroscopic guidance, a Quincke needle was inserted until contact was made with os over the superior postero-lateral aspect of the pedicular shadow (target area). After negative aspiration for blood, 0.5 mL of the nerve block solution was injected without difficulty or complication. The needle was removed intact. S1 Medial Branch Nerve Block (MBB): The target area for the S1 medial branch is at the posterior and inferior 6 o'clock position of the L5-S1 facet joint. Under fluoroscopic guidance, the Quincke needle inserted for the L5 MBB was redirected until contact was made with os over the inferior  and postero aspect of the sacrum, at the 6 o' clock position under the L5-S1 facet joint (Target area). After negative aspiration for blood, 0.5 mL of the nerve block solution was injected without difficulty or complication. The needle was removed intact.  Once the entire procedure was completed, the treated area was cleaned, making sure to leave some of the prepping solution back to take advantage of its long term bactericidal properties.         Illustration of the posterior view of the lumbar spine and the posterior neural structures. Laminae of L2 through S1 are labeled. DPRL5, dorsal primary ramus of L5; DPRS1, dorsal primary ramus of S1; DPR3, dorsal primary ramus of L3; FJ, facet (zygapophyseal) joint L3-L4; I, inferior articular process of L4; LB1, lateral branch of dorsal primary ramus of L1; IAB, inferior articular branches from L3 medial branch (supplies L4-L5 facet joint); IBP, intermediate branch plexus; MB3, medial branch of dorsal primary ramus of L3; NR3, third lumbar nerve root; S, superior articular process of L5; SAB, superior articular branches from L4 (supplies L4-5 facet joint also); TP3, transverse process of L3.   Facet Joint Innervation (* possible contribution)  L1-2 T12, L1 (L2*)  Medial Branch  L2-3 L1, L2 (L3*)         "          "  L3-4 L2, L3 (L4*)         "          "  L4-5 L3, L4 (L5*)         "          "  L5-S1 L4, L5, S1          "          "    There were no vitals filed for this visit.   End Time:   hrs.  Imaging Guidance (Spinal):          Type of Imaging Technique: Fluoroscopy Guidance (Spinal) Indication(s): Assistance in needle guidance and placement for procedures requiring needle placement in or near specific anatomical locations not easily accessible without such assistance. Exposure Time: Please see nurses notes. Contrast: None used. Fluoroscopic Guidance: I was personally present during the use of fluoroscopy. "Tunnel Vision Technique" used  to obtain the best possible view of the target area. Parallax error corrected before commencing the procedure. "Direction-depth-direction" technique used to introduce the needle under continuous pulsed fluoroscopy. Once target was reached, antero-posterior, oblique, and lateral fluoroscopic projection used confirm needle placement in all planes. Images permanently stored in EMR. Interpretation: No contrast injected. I personally interpreted the imaging intraoperatively. Adequate needle placement confirmed in multiple planes. Permanent images saved into the patient's record.  Post-operative Assessment:  Post-procedure Vital Signs:  Pulse/HCG Rate:    Temp:   Resp:   BP:   SpO2:    EBL: None  Complications: No immediate post-treatment complications observed by team, or reported by patient.  Note: The patient tolerated the entire procedure well. A repeat set of vitals were taken after the procedure and the patient was kept under observation following institutional policy, for this type of procedure. Post-procedural neurological assessment was performed, showing return to baseline, prior to discharge. The patient was provided with post-procedure discharge instructions, including a section on how to identify potential problems. Should any problems arise concerning this procedure, the patient was given instructions to immediately contact us, at any time, without hesitation. In any case, we plan to contact the patient  by telephone for a follow-up status report regarding this interventional procedure.  Comments:  No additional relevant information.  Plan of Care (POC)  Orders:  No orders of the defined types were placed in this encounter.  Chronic Opioid Analgesic:  Oxycodone IR 10 mg 4 times daily. MME/day: 60 mg/day.   Medications ordered for procedure: No orders of the defined types were placed in this encounter.  Medications administered: Cliff L. Tadlock Jr. had no medications  administered during this visit.  See the medical record for exact dosing, route, and time of administration.  Follow-up plan:   No follow-ups on file.       Interventional Therapies  Risk Factors  Considerations:   MO  GERD  Tobacco abuse  Hypogonadonism     Planned  Pending:      Under consideration:   Possible left SI joint RFA Diagnostic caudal ESI + diagnostic epidurogram  Possible Racz procedure  Diagnostic left IA knee injection (w/ steroid)  Possible left IA Hyalgan knee injections  Diagnostic left Genicular NB  Possible left Genicular nerve RFA    Completed:   Diagnostic/therapeutic left IA hip joint + bursa injection x1 (11/25/2020) (100/100/0/0)  Therapeutic right lumbar facet MBB x3 (05/15/2020) (100/100/50/50)  Therapeutic left lumbar facet MBB x3 (05/15/2020) 100/100/50/50)  Therapeutic right lumbar facet RFA x1 (10/11/2017) NR Therapeutic left lumbar facet RFA x1 (11/24/2017) NR Therapeutic right SI joint block x1 (05/19/2017)  Therapeutic left SI joint block x1 (05/19/2017)  Therapeutic right SI joint RFA x1 (10/11/2017)  Therapeutic right L4 TFES2 x1 (05/30/2018)    Therapeutic  Palliative (PRN) options:   Palliative bilateral lumbar facet block #4 Palliative bilateral SI joint block #2  Diagnostic right L4 TFES2 #2    Pharmacotherapy  Nonopioids transferred 05/07/2020: Magnesium, vitamin D3, and calcium        Recent Visits Date Type Provider Dept  01/24/23 Office Visit Delano Metz, MD [facility]  Showing recent visits within past 90 days and meeting all other requirements Future Appointments Date Type Provider Dept  02/01/23 Appointment Delano Metz, MD [facility]  04/20/23 Appointment Delano Metz, MD [facility]  Showing future appointments within next 90 days and meeting all other requirements  Disposition: Discharge home  Discharge (Date  Time): 02/01/2023;   hrs.   Primary Care  Physician: Smith Robert, MD Location: [facility] Note by: Oswaldo Done, MD (TTS technology used. I apologize for any typographical errors that were not detected and corrected.) Date: 02/01/2023; Time: 4:06 PM  Disclaimer:  Medicine is not an Visual merchandiser. The only guarantee in medicine is that nothing is guaranteed. It is important to note that the decision to proceed with this intervention was based on the information collected from the patient. The Data and conclusions were drawn from the patient's questionnaire, the interview, and the physical examination. Because the information was provided in large part by the patient, it cannot be guaranteed that it has not been purposely or unconsciously manipulated. Every effort has been made to obtain as much relevant data as possible for this evaluation. It is important to note that the conclusions that lead to this procedure are derived in large part from the available data. Always take into account that the treatment will also be dependent on availability of resources and existing treatment guidelines, considered by other Pain Management Practitioners as being common knowledge and practice, at the time of the intervention. For Medico-Legal purposes, it is also important to  point out that variation in procedural techniques and pharmacological choices are the acceptable norm. The indications, contraindications, technique, and results of the above procedure should only be interpreted and judged by a Board-Certified Interventional Pain Specialist with extensive familiarity and expertise in the same exact procedure and technique.     PROVIDER NOTE: Interpretation of information contained herein should be left to medically-trained personnel. Specific patient instructions are provided elsewhere under "Patient Instructions" section of medical record. This document was created in part using STT-dictation technology, any  transcriptional errors that may result from this process are unintentional.  Patient: Nathan Lambert. Type: Established DOB: [birth year] MRN: [id number] PCP: Smith Robert, MD  Service: Procedure DOS: 02/01/2023 Setting: Ambulatory Location: Ambulatory outpatient facility Delivery: Face-to-face Provider: Oswaldo Done, MD Specialty: Interventional Pain Management Specialty designation: 09 Location: Outpatient facility Ref. Prov.: Smith Robert, MD       Interventional Therapy   Type: Hip & bursae injection #1  Laterality: Right (-RT)  Bursae: Trochanteric  Laterality: Right (-RT)  Approach: Percutaneous posterolateral approach. Level: Lower pelvic and hip joint level.  Imaging: Fluoroscopy-guided         Anesthesia: Local anesthesia (1-2% Lidocaine) Anxiolysis: IV Versed         Sedation:                         DOS: 02/01/2023  Performed by: Oswaldo Done, MD  Purpose: Diagnostic/Therapeutic Indications: Hip pain severe enough to impact quality of life or function. Rationale (medical necessity): procedure needed and proper for the diagnosis and/or treatment of Mr. Stangeland's medical symptoms and needs. 1. Chronic low back pain (1ry area of Pain) (Bilateral) w/o sciatica   2. Lumbar facet syndrome (Bilateral) (R>L)   3. Lumbar facet hypertrophy (Bilateral)   4. Lumbar facet joint pain   5. Spondylosis without myelopathy or radiculopathy, lumbar region   6. Chronic hip pain (Right)   7. Osteoarthritis involving multiple joints    NAS-11 Pain score:   Pre-procedure:  /10   Post-procedure:  /10      Target: Intra-articular aspect of the hip joint & peri-articular bursae Region: Hip joint proper. Femoral region Procedure Type: Percutaneous injection   Position / Prep / Materials:  Position:    ***      Prep solution: DuraPrep (Iodine Povacrylex [0.7% available iodine] and Isopropyl Alcohol, 74% w/w) Prep Area:  Entire Posterolateral hip  area. Materials:  Tray: Block tray Needle(s):  Type: Spinal  Gauge (G): 22  Length:    ***     Qty:    ***     H&P (Pre-op Assessment):  Mr. Navarro is a 42 y.o. (year old), male patient, seen today for interventional treatment. He  has a past surgical history that includes Fracture surgery (Left); Spine surgery (Right, 06/16/2015); and Back surgery. Mr. Schulke has a current medication list which includes the following prescription(s): azelastine, clobetasol ointment, doxycycline, econazole nitrate, famotidine, fluticasone, gentamicin cream, glipizide, loratadine, magnesium, magnesium oxide -mg supplement, montelukast, naloxone, oxycodone hcl, [START ON 02/26/2023] oxycodone hcl, [START ON 03/28/2023] oxycodone hcl, terbinafine, and testosterone cypionate. His primarily concern today is the No chief complaint on file.  Initial Vital Signs:  Pulse/HCG Rate:    Temp:   Resp:   BP:   SpO2:    BMI: Estimated body mass index is 39.05 kg/m as calculated from the following:   Height as of 01/24/23: 5\' 11"  (1.803 m).   Weight as of 01/24/23:  280 lb (127 kg).  Risk Assessment: Allergies: Reviewed. He is allergic to bactrim [sulfamethoxazole-trimethoprim], pollen extract, sulfa antibiotics, and penicillins.  Allergy Precautions: None required Coagulopathies: Reviewed. None identified.  Blood-thinner therapy: None at this time Active Infection(s): Reviewed. None identified. Mr. Vandrunen is afebrile  Site Confirmation: Mr. Gongora was asked to confirm the procedure and laterality before marking the site Procedure checklist: Completed Consent: Before the procedure and under the influence of no sedative(s), amnesic(s), or anxiolytics, the patient was informed of the treatment options, risks and possible complications. To fulfill our ethical and legal obligations, as recommended by the American Medical Association's Code of Ethics, I have informed the patient of my clinical impression; the  nature and purpose of the treatment or procedure; the risks, benefits, and possible complications of the intervention; the alternatives, including doing nothing; the risk(s) and benefit(s) of the alternative treatment(s) or procedure(s); and the risk(s) and benefit(s) of doing nothing. The patient was provided information about the general risks and possible complications associated with the procedure. These may include, but are not limited to: failure to achieve desired goals, infection, bleeding, organ or nerve damage, allergic reactions, paralysis, and death. In addition, the patient was informed of those risks and complications associated to the procedure, such as failure to decrease pain; infection; bleeding; organ or nerve damage with subsequent damage to sensory, motor, and/or autonomic systems, resulting in permanent pain, numbness, and/or weakness of one or several areas of the body; allergic reactions; (i.e.: anaphylactic reaction); and/or death. Furthermore, the patient was informed of those risks and complications associated with the medications. These include, but are not limited to: allergic reactions (i.e.: anaphylactic or anaphylactoid reaction(s)); adrenal axis suppression; blood sugar elevation that in diabetics may result in ketoacidosis or comma; water retention that in patients with history of congestive heart failure may result in shortness of breath, pulmonary edema, and decompensation with resultant heart failure; weight gain; swelling or edema; medication-induced neural toxicity; particulate matter embolism and blood vessel occlusion with resultant organ, and/or nervous system infarction; and/or aseptic necrosis of one or more joints. Finally, the patient was informed that Medicine is not an exact science; therefore, there is also the possibility of unforeseen or unpredictable risks and/or possible complications that may result in a catastrophic outcome. The patient indicated having  understood very clearly. We have given the patient no guarantees and we have made no promises. Enough time was given to the patient to ask questions, all of which were answered to the patient's satisfaction. Mr. Tadros has indicated that he wanted to continue with the procedure. Attestation: I, the ordering provider, attest that I have discussed with the patient the benefits, risks, side-effects, alternatives, likelihood of achieving goals, and potential problems during recovery for the procedure that I have provided informed consent. Date  Time: {[facility] TIME CHOICES:[id number]}  Pre-Procedure Preparation:  Monitoring: As per clinic protocol. Respiration, ETCO2, SpO2, BP, heart rate and rhythm monitor placed and checked for adequate function Safety Precautions: Patient was assessed for positional comfort and pressure points before starting the procedure. Time-out: I initiated and conducted the "Time-out" before starting the procedure, as per protocol. The patient was asked to participate by confirming the accuracy of the "Time Out" information. Verification of the correct person, site, and procedure were performed and confirmed by me, the nursing staff, and the patient. "Time-out" conducted as per Joint Commission's Universal Protocol (UP.01.01.01). Time:   Start Time:   hrs.  Narrative  Rationale (medical necessity): procedure needed and proper for the diagnosis and/or treatment of the patient's medical symptoms and needs. Procedural Technique Safety Precautions: Aspiration looking for blood return was conducted prior to all injections. At no point did we inject any substances, as a needle was being advanced. No attempts were made at seeking any paresthesias. Safe injection practices and needle disposal techniques used. Medications properly checked for expiration dates. SDV (single dose vial) medications used. Description of the Procedure: Protocol guidelines were followed.  The patient was assisted into a comfortable position. The target area was identified and the area prepped in the usual manner. Skin & deeper tissues infiltrated with local anesthetic. Appropriate amount of time allowed to pass for local anesthetics to take effect. The procedure needles were then advanced to the target area. Proper needle placement secured. Negative aspiration confirmed. Solution injected in intermittent fashion, asking for systemic symptoms every 0.5cc of injectate. The needles were then removed and the area cleansed, making sure to leave some of the prepping solution back to take advantage of its long term bactericidal properties.  Technical description of procedure:  Skin & deeper tissues infiltrated with local anesthetic. Appropriate amount of time allowed to pass for local anesthetics to take effect. The procedure needles were then advanced to the target area. Proper needle placement secured. Negative aspiration confirmed. Solution injected in intermittent fashion, asking for systemic symptoms every 0.5cc of injectate. The needles were then removed and the area cleansed, making sure to leave some of the prepping solution back to take advantage of its long term bactericidal properties.             There were no vitals filed for this visit.   Start Time:   hrs. End Time:   hrs.  Imaging Guidance (Non-Spinal):          Type of Imaging Technique: Fluoroscopy Guidance (Non-Spinal) Indication(s): Assistance in needle guidance and placement for procedures requiring needle placement in or near specific anatomical locations not easily accessible without such assistance. Exposure Time: Please see nurses notes. Contrast: Before injecting any contrast, we confirmed that the patient did not have an allergy to iodine, shellfish, or radiological contrast. Once satisfactory needle placement was completed at the desired level, radiological contrast was injected. Contrast injected under live  fluoroscopy. No contrast complications. See chart for type and volume of contrast used. Fluoroscopic Guidance: I was personally present during the use of fluoroscopy. "Tunnel Vision Technique" used to obtain the best possible view of the target area. Parallax error corrected before commencing the procedure. "Direction-depth-direction" technique used to introduce the needle under continuous pulsed fluoroscopy. Once target was reached, antero-posterior, oblique, and lateral fluoroscopic projection used confirm needle placement in all planes. Images permanently stored in EMR. Interpretation: I personally interpreted the imaging intraoperatively. Adequate needle placement confirmed in multiple planes. Appropriate spread of contrast into desired area was observed. No evidence of afferent or efferent intravascular uptake. Permanent images saved into the patient's record.  Post-operative Assessment:  Post-procedure Vital Signs:  Pulse/HCG Rate:    Temp:   Resp:   BP:   SpO2:    EBL: None  Complications: No immediate post-treatment complications observed by team, or reported by patient.  Note: The patient tolerated the entire procedure well. A repeat set of vitals were taken after the procedure and the patient was kept under observation following institutional policy, for this type of procedure. Post-procedural neurological assessment was performed, showing return to baseline, prior to discharge. The patient was provided with  post-procedure discharge instructions, including a section on how to identify potential problems. Should any problems arise concerning this procedure, the patient was given instructions to immediately contact us, at any time, without hesitation. In any case, we plan to contact the patient by telephone for a follow-up status report regarding this interventional procedure.  Comments:  No additional relevant information.  Plan of Care (POC)  Orders:  No orders of the defined types  were placed in this encounter.  Chronic Opioid Analgesic:  Oxycodone IR 10 mg 4 times daily. MME/day: 60 mg/day.   Medications ordered for procedure: No orders of the defined types were placed in this encounter.  Medications administered: Palmer L. Wainright Jr. had no medications administered during this visit.  See the medical record for exact dosing, route, and time of administration.  Follow-up plan:   No follow-ups on file.       Interventional Therapies  Risk Factors  Considerations:   MO  GERD  Tobacco abuse  Hypogonadonism     Planned  Pending:      Under consideration:   Possible left SI joint RFA Diagnostic caudal ESI + diagnostic epidurogram  Possible Racz procedure  Diagnostic left IA knee injection (w/ steroid)  Possible left IA Hyalgan knee injections  Diagnostic left Genicular NB  Possible left Genicular nerve RFA    Completed:   Diagnostic/therapeutic left IA hip joint + bursa injection x1 (11/25/2020) (100/100/0/0)  Therapeutic right lumbar facet MBB x3 (05/15/2020) (100/100/50/50)  Therapeutic left lumbar facet MBB x3 (05/15/2020) 100/100/50/50)  Therapeutic right lumbar facet RFA x1 (10/11/2017) NR Therapeutic left lumbar facet RFA x1 (11/24/2017) NR Therapeutic right SI joint block x1 (05/19/2017)  Therapeutic left SI joint block x1 (05/19/2017)  Therapeutic right SI joint RFA x1 (10/11/2017)  Therapeutic right L4 TFES2 x1 (05/30/2018)    Therapeutic  Palliative (PRN) options:   Palliative bilateral lumbar facet block #4 Palliative bilateral SI joint block #2  Diagnostic right L4 TFES2 #2    Pharmacotherapy  Nonopioids transferred 05/07/2020: Magnesium, vitamin D3, and calcium        Recent Visits Date Type Provider Dept  01/24/23 Office Visit Delano Metz, MD [facility]  Showing recent visits within past 90 days and meeting all other requirements Future Appointments Date Type Provider Dept  02/01/23 Appointment  Delano Metz, MD [facility]  04/20/23 Appointment Delano Metz, MD [facility]  Showing future appointments within next 90 days and meeting all other requirements  Disposition: Discharge home  Discharge (Date  Time): 02/01/2023;   hrs.   Primary Care Physician: Smith Robert, MD Location: [facility] Note by: Oswaldo Done, MD (TTS technology used. I apologize for any typographical errors that were not detected and corrected.) Date: 02/01/2023; Time: 4:06 PM  Disclaimer:  Medicine is not an Visual merchandiser. The only guarantee in medicine is that nothing is guaranteed. It is important to note that the decision to proceed with this intervention was based on the information collected from the patient. The Data and conclusions were drawn from the patient's questionnaire, the interview, and the physical examination. Because the information was provided in large part by the patient, it cannot be guaranteed that it has not been purposely or unconsciously manipulated. Every effort has been made to obtain as much relevant data as possible for this evaluation. It is important to note that the conclusions that lead to this procedure are derived in large part from the available data. Always take into  account that the treatment will also be dependent on availability of resources and existing treatment guidelines, considered by other Pain Management Practitioners as being common knowledge and practice, at the time of the intervention. For Medico-Legal purposes, it is also important to point out that variation in procedural techniques and pharmacological choices are the acceptable norm. The indications, contraindications, technique, and results of the above procedure should only be interpreted and judged by a Board-Certified Interventional Pain Specialist with extensive familiarity and expertise in the same exact procedure and technique.

## 2023-02-01 ENCOUNTER — Ambulatory Visit: Payer: BLUE CROSS/BLUE SHIELD | Attending: Pain Medicine | Admitting: Pain Medicine

## 2023-02-01 ENCOUNTER — Ambulatory Visit
Admission: RE | Admit: 2023-02-01 | Discharge: 2023-02-01 | Disposition: A | Discharge status: Home / Self Care | Payer: BLUE CROSS/BLUE SHIELD | Source: Ambulatory Visit | Attending: Pain Medicine | Admitting: Pain Medicine

## 2023-02-01 ENCOUNTER — Encounter: Payer: Self-pay | Admitting: Pain Medicine

## 2023-02-01 VITALS — BP 116/72 | HR 67 | Temp 98.1°F | Resp 14 | Ht 71.0 in | Wt 280.0 lb

## 2023-02-01 DIAGNOSIS — M25551 Pain in right hip: Secondary | ICD-10-CM | POA: Diagnosis not present

## 2023-02-01 DIAGNOSIS — M5459 Other low back pain: Secondary | ICD-10-CM | POA: Diagnosis present

## 2023-02-01 DIAGNOSIS — L732 Hidradenitis suppurativa: Secondary | ICD-10-CM

## 2023-02-01 DIAGNOSIS — M159 Polyosteoarthritis, unspecified: Secondary | ICD-10-CM | POA: Diagnosis present

## 2023-02-01 DIAGNOSIS — G8929 Other chronic pain: Secondary | ICD-10-CM

## 2023-02-01 DIAGNOSIS — M150 Primary generalized (osteo)arthritis: Secondary | ICD-10-CM

## 2023-02-01 DIAGNOSIS — M545 Low back pain, unspecified: Secondary | ICD-10-CM | POA: Diagnosis present

## 2023-02-01 DIAGNOSIS — M47816 Spondylosis without myelopathy or radiculopathy, lumbar region: Secondary | ICD-10-CM | POA: Diagnosis not present

## 2023-02-01 MED ORDER — LIDOCAINE HCL 2 % IJ SOLN
20.0000 mL | Freq: Once | INTRAMUSCULAR | Status: AC
  Administered 2023-02-01: 400 mg

## 2023-02-01 MED ORDER — METHYLPREDNISOLONE ACETATE 80 MG/ML IJ SUSP
INTRAMUSCULAR | Status: AC
  Filled 2023-02-01: qty 1

## 2023-02-01 MED ORDER — METHYLPREDNISOLONE ACETATE 80 MG/ML IJ SUSP
80.0000 mg | Freq: Once | INTRAMUSCULAR | Status: AC
  Administered 2023-02-01: 80 mg via INTRA_ARTICULAR

## 2023-02-01 MED ORDER — PENTAFLUOROPROP-TETRAFLUOROETH EX AERO
INHALATION_SPRAY | Freq: Once | CUTANEOUS | Status: AC
  Administered 2023-02-01: 30 via TOPICAL
  Filled 2023-02-01: qty 116

## 2023-02-01 MED ORDER — ROPIVACAINE HCL 2 MG/ML IJ SOLN
9.0000 mL | Freq: Once | INTRAMUSCULAR | Status: AC
  Administered 2023-02-01: 9 mL via PERINEURAL

## 2023-02-01 MED ORDER — FENTANYL CITRATE (PF) 100 MCG/2ML IJ SOLN
INTRAMUSCULAR | Status: AC
  Filled 2023-02-01: qty 2

## 2023-02-01 MED ORDER — MIDAZOLAM HCL 5 MG/5ML IJ SOLN
INTRAMUSCULAR | Status: AC
  Filled 2023-02-01: qty 5

## 2023-02-01 MED ORDER — TRIAMCINOLONE ACETONIDE 40 MG/ML IJ SUSP
40.0000 mg | Freq: Once | INTRAMUSCULAR | Status: AC
  Administered 2023-02-01: 40 mg

## 2023-02-01 MED ORDER — TRIAMCINOLONE ACETONIDE 40 MG/ML IJ SUSP
INTRAMUSCULAR | Status: AC
  Filled 2023-02-01: qty 1

## 2023-02-01 MED ORDER — FENTANYL CITRATE (PF) 100 MCG/2ML IJ SOLN
25.0000 ug | INTRAMUSCULAR | Status: DC | PRN
  Administered 2023-02-01: 50 ug via INTRAVENOUS

## 2023-02-01 MED ORDER — LIDOCAINE HCL 2 % IJ SOLN
INTRAMUSCULAR | Status: AC
  Filled 2023-02-01: qty 20

## 2023-02-01 MED ORDER — SODIUM CHLORIDE 0.9 % IV SOLN
100.0000 mg | Freq: Once | INTRAVENOUS | Status: AC
  Administered 2023-02-01: 100 mg via INTRAVENOUS
  Filled 2023-02-01: qty 100

## 2023-02-01 MED ORDER — ROPIVACAINE HCL 2 MG/ML IJ SOLN
9.0000 mL | Freq: Once | INTRAMUSCULAR | Status: AC
  Administered 2023-02-01: 9 mL via INTRA_ARTICULAR

## 2023-02-01 MED ORDER — LACTATED RINGERS IV SOLN: Freq: Once | INTRAVENOUS | Status: AC

## 2023-02-01 MED ORDER — ROPIVACAINE HCL 2 MG/ML IJ SOLN
INTRAMUSCULAR | Status: AC
  Filled 2023-02-01: qty 20

## 2023-02-01 MED ORDER — MIDAZOLAM HCL 5 MG/5ML IJ SOLN
0.5000 mg | Freq: Once | INTRAMUSCULAR | Status: AC
  Administered 2023-02-01: 3 mg via INTRAVENOUS

## 2023-02-02 ENCOUNTER — Telehealth: Payer: Self-pay

## 2023-02-02 NOTE — Telephone Encounter (Signed)
 Post procedure follow up.  LM 

## 2023-02-14 NOTE — Progress Notes (Signed)
PROVIDER NOTE: Information contained herein reflects review and annotations entered in association with encounter. Interpretation of such information and data should be left to medically-trained personnel. Information provided to patient can be located elsewhere in the medical record under "Patient Instructions". Document created using STT-dictation technology, any transcriptional errors that may result from process are unintentional.    Patient: Nathan Lambert.  Service Category: E/M  Provider: Oswaldo Done, MD  DOB: 02-09-1981  DOS: 02/15/2023  Referring Provider: Smith Robert, MD  MRN: [id number]  Specialty: Interventional Pain Management  PCP: Nathan Robert, MD  Type: Established Patient  Setting: Ambulatory outpatient    Location: Office  Delivery: Face-to-face     HPI  [name], a 42 y.o. year old male, is here today because of his Chronic bilateral low back pain without sciatica [M54.50, G89.29]. Mr. Renard primary complain today is No chief complaint on file.  Pertinent problems: 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); Chronic lower extremity pain (3ry area of Pain) (Bilateral) (L>R); Lumbar facet hypertrophy (Bilateral); Lumbar facet syndrome (Bilateral) (R>L); Neurogenic pain; Spondylosis without myelopathy or radiculopathy, lumbar region; Other specified dorsopathies, sacral and sacrococcygeal region; Lumbar spondylosis; Chronic upper extremity pain (Left); Pain and numbness of left upper extremity; Cervical radiculitis (C6/C7) (Left); DDD  (degenerative disc disease), cervical; Cervicalgia; Osteoarthritis involving multiple joints; Chronic hip pain (4th area of Pain) (Bilateral) (L>R); Greater trochanteric bursitis (Left); Lumbar back pain with radiculopathy affecting left lower extremity (L5 dermatomal distribution); Subacute lumbar radiculopathy (L5) (Left); Chronic low back pain (1ry area of Pain) (Bilateral) w/o sciatica; Decreased range of motion of both hips; Chronic hip pain (Left); Enthesopathy of hip region (Left); Other bursitis of hip (Left); Chronic groin pain (Left); Headache disorder; Cervical radiculopathy; Lumbar radiculopathy; Chronic hip pain (Right); and Lumbar facet joint pain on their pertinent problem list. Pain Assessment: Severity of   is reported as a  /10. Location:    / . Onset:  . Quality:  . Timing:  . Modifying factor(s):  Marland Kitchen Vitals:  vitals were not taken for this visit.  BMI: Estimated body mass index is 39.05 kg/m as calculated from the following:   Height as of 02/01/23: 5\' 11"  (1.803 m).   Weight as of 02/01/23: 280 lb (127 kg). Last encounter: 01/24/2023. Last procedure: 02/01/2023.  Reason for encounter: post-procedure evaluation and assessment. ***  Post-procedure evaluation   Procedure No.1: Lumbar Facet, Medial Branch Block(s) #4  Laterality: Right  Level: L4, L5, and S1 Medial Branch Level(s). Injecting these levels blocks the L5-S1 lumbar facet joints. Imaging: Fluoroscopic guidance Spinal ([id number]) Anesthesia: Local anesthesia (1-2% Lidocaine) Anxiolysis: IV Versed 3.0 mg Sedation: Moderate Sedation Fentanyl 1 mL (50 mcg) DOS: 02/01/2023 Performed by: Nathan Done, MD  Primary Purpose: Diagnostic/Therapeutic Indications: Low back pain severe enough to impact quality of life or function. 1. Chronic low back pain (1ry area of Pain) (Bilateral) w/o sciatica   2. Lumbar facet syndrome (Bilateral) (R>L)   3. Lumbar facet hypertrophy (Bilateral)   4. Lumbar facet joint pain   5.  Spondylosis without myelopathy or radiculopathy, lumbar region    Procedure No.2: Hip & bursae injection #1  Laterality: Right (-RT)  Bursae: Trochanteric  Laterality: Right (-RT)  Approach: Percutaneous posterolateral approach. Level: Lower pelvic and hip joint level.  Imaging: Fluoroscopy-guided Non-spinal ([id number]) Target: Intra-articular aspect of the hip joint & peri-articular bursae Region: Hip joint proper. Femoral region Procedure Type: Percutaneous injection   Purpose: Diagnostic/Therapeutic Indications: Hip pain severe enough to impact quality of life or function. Rationale (medical necessity): procedure needed and proper for the diagnosis and/or treatment of [name]'s medical symptoms and needs. 1. Chronic hip pain (Right)   2. Osteoarthritis involving multiple joints    NAS-11 Pain score:   Pre-procedure: 3 /10   Post-procedure: 0-No pain/10      Effectiveness:  Initial hour after procedure:   ***. Subsequent 4-6 hours post-procedure:   ***. Analgesia past initial 6 hours:   ***. Ongoing improvement:  Analgesic:  *** Function:    ***    ROM:    ***     Pharmacotherapy Assessment  Analgesic: Oxycodone IR 10 mg 4 times daily. MME/day: 60 mg/day.   Monitoring: [place] PMP: PDMP reviewed during this encounter.       Pharmacotherapy: No side-effects or adverse reactions reported. Compliance: No problems identified. Effectiveness: Clinically acceptable.  No notes on file  No results found for: "CBDTHCR" No results found for: "D8THCCBX" No results found for: "D9THCCBX"  UDS:  Summary  Date Value Ref Range Status  01/24/2023 Note  Final    Comment:    ==================================================================== ToxASSU[employer] Select 13 (MW) ==================================================================== Specimen Alert ToxAssure, ToxAssure Flex or Mat drug testing: -Engineer, production- Result certification performed at [facility], [address] [place], [place] [address]. [phone] Lab Director Cherylann Ratel, PhrmD. ==================================================================== Test                             Result       Flag       Units  Drug Present   Oxycodone                      839                     ng/mg creat   Oxymorphone                    1277                    ng/mg creat   Noroxycodone                   868                     ng/mg creat   Noroxymorphone                 349                     ng/mg creat    Sources of oxycodone are scheduled prescription medications.    Oxymorphone, noroxycodone, and noroxymorphone are expected    metabolites of oxycodone. Oxymorphone is also available as a    scheduled prescription medication.  ==================================================================== Test                      Result    Flag   Units      Ref Range   Creatinine  186              mg/dL      >=86 ==================================================================== Declared Medications:  Medication list was not provided. ==================================================================== For clinical consultation, please call [phone]. ====================================================================       ROS  Constitutional: Denies any fever or chills Gastrointestinal: No reported hemesis, hematochezia, vomiting, or acute GI distress Musculoskeletal: Denies any acute onset joint swelling, redness, loss of ROM, or weakness Neurological: No reported episodes of acute onset apraxia, aphasia, dysarthria, agnosia, amnesia, paralysis, loss of coordination, or loss of consciousness  Medication Review  Magnesium, Magnesium Oxide -Mg Supplement, Oxycodone HCl, azelastine, clobetasol ointment, doxycycline, econazole nitrate, famotidine, fluticasone, gentamicin cream, glipiZIDE, loratadine, montelukast, naloxone, terbinafine, and testosterone  cypionate  History Review  Allergy: [name] is allergic to bactrim [sulfamethoxazole-trimethoprim], pollen extract, sulfa antibiotics, and penicillins. Drug: [name]  reports no history of drug use. Alcohol:  reports no history of alcohol use. Tobacco:  reports that he has been smoking cigarettes. He has a 10 pack-year smoking history. He has never used smokeless tobacco. Social: [name]  reports that he has been smoking cigarettes. He has a 10 pack-year smoking history. He has never used smokeless tobacco. He reports that he does not drink alcohol and does not use drugs. Medical:  has a past medical history of Allergy (2012), Chronic low back pain (Primary Area of Pain) (Bilateral) (R>L), Degeneration of lumbar or lumbosacral intervertebral disc (L4-L5) (12/27/2016), Gastroesophageal reflux disease without esophagitis (10/26/2021), Headache, Mixed hyperlipidemia (10/26/2021), and Opiate use (02/15/2017). Surgical: [name]  has a past surgical history that includes Fracture surgery (Left); Spine surgery (Right, 06/16/2015); and Back surgery. Family: family history includes AAA (abdominal aortic aneurysm) in his mother; Diabetes in his father; Stroke in his mother.  Laboratory Chemistry Profile   Renal Lab Results  Component Value Date   BUN 9 05/23/2022   CREATININE 0.76 05/23/2022   BCR 7 (L) 12/27/2016   GFRAA >60 06/07/2019   GFRNONAA >60 05/23/2022    Hepatic Lab Results  Component Value Date   AST 16 05/23/2022   ALT 12 05/23/2022   ALBUMIN 4.0 05/23/2022   ALKPHOS 51 05/23/2022    Electrolytes Lab Results  Component Value Date   NA 140 05/23/2022   K 3.9 05/23/2022   CL 107 05/23/2022   CALCIUM 9.1 05/23/2022   MG 2.0 06/07/2019    Bone Lab Results  Component Value Date   VD25OH 15.51 (L) 06/07/2019   25OHVITD1 17 (L) 12/27/2016   25OHVITD2 <1.0 12/27/2016   25OHVITD3 17 12/27/2016   TESTOFREE 2.6 (L) 06/08/2017   TESTOSTERONE 116 (L)  06/08/2017    Inflammation (CRP: Acute Phase) (ESR: Chronic Phase) Lab Results  Component Value Date   CRP 2.7 (H) 06/07/2019   ESRSEDRATE 17 (H) 06/07/2019         Note: Above Lab results reviewed.  Recent Imaging Review  DG PAIN CLINIC C-ARM 1-60 MIN NO REPORT Fluoro was used, but no Radiologist interpretation will be provided.  Please refer to "NOTES" tab for provider progress note. Note: Reviewed        Physical Exam  General appearance: Well nourished, well developed, and well hydrated. In no apparent acute distress Mental status: Alert, oriented x 3 (person, place, & time)       Respiratory: No evidence of acute respiratory distress Eyes: PERLA Vitals: There were no vitals taken for this visit. BMI: Estimated body mass index is 39.05 kg/m as calculated from the following:  Height as of 02/01/23: 5\' 11"  (1.803 m).   Weight as of 02/01/23: 280 lb (127 kg). Ideal: Ideal body weight: 75.3 kg (166 lb 0.1 oz) Adjusted ideal body weight: 96 kg (211 lb 9.7 oz)  Assessment   Diagnosis Status  1. Chronic low back pain (1ry area of Pain) (Bilateral) w/o sciatica   2. Lumbar facet joint pain   3. Chronic hip pain (Right)   4. Postop check    Controlled Controlled Controlled   Updated Problems: No problems updated.  Plan of Care  Problem-specific:  No problem-specific Assessment & Plan notes found for this encounter.  Mr. Nathan Barb. has a current medication list which includes the following long-term medication(s): azelastine, famotidine, fluticasone, glipizide, loratadine, magnesium, magnesium oxide -mg supplement, montelukast, naloxone, oxycodone hcl, [START ON 02/26/2023] oxycodone hcl, and [START ON 03/28/2023] oxycodone hcl.  Pharmacotherapy (Medications Ordered): No orders of the defined types were placed in this encounter.  Orders:  No orders of the defined types were placed in this encounter.  Follow-up plan:   No follow-ups on file.       Interventional Therapies  Risk Factors  Considerations:   [place]  GERD  Tobacco abuse  Hypogonadonism     Planned  Pending:      Under consideration:   Possible left SI joint RFA Diagnostic caudal ESI + diagnostic epidurogram  Possible Racz procedure  Diagnostic left IA knee injection (w/ steroid)  Possible left IA Hyalgan knee injections  Diagnostic left Genicular NB  Possible left Genicular nerve RFA    Completed:   Diagnostic/therapeutic left IA hip joint + bursa injection x1 (11/25/2020) (100/100/0/0)  Therapeutic right lumbar facet MBB x3 (05/15/2020) (100/100/50/50)  Therapeutic left lumbar facet MBB x3 (05/15/2020) 100/100/50/50)  Therapeutic right lumbar facet RFA x1 (10/11/2017) NR Therapeutic left lumbar facet RFA x1 (11/24/2017) NR Therapeutic right SI joint block x1 (05/19/2017)  Therapeutic left SI joint block x1 (05/19/2017)  Therapeutic right SI joint RFA x1 (10/11/2017)  Therapeutic right L4 TFES2 x1 (05/30/2018)    Therapeutic  Palliative (PRN) options:   Palliative bilateral lumbar facet block #4 Palliative bilateral SI joint block #2  Diagnostic right L4 TFES2 #2    Pharmacotherapy  Nonopioids transferred 05/07/2020: Magnesium, vitamin D3, and calcium        Recent Visits Date Type Provider Dept  02/01/23 Procedure visit Delano Metz, MD [facility]  01/24/23 Office Visit Delano Metz, MD [facility]  Showing recent visits within past 90 days and meeting all other requirements Future Appointments Date Type Provider Dept  02/15/23 Appointment Delano Metz, MD [facility]  04/20/23 Appointment Delano Metz, MD [facility]  Showing future appointments within next 90 days and meeting all other requirements  I discussed the assessment and treatment plan with the patient. The patient was provided an opportunity to ask questions and all were answered. The patient agreed with the plan and  demonstrated an understanding of the instructions.  Patient advised to call back or seek an in-person evaluation if the symptoms or condition worsens.  Duration of encounter: *** minutes.  Total time on encounter, as per AMA guidelines included both the face-to-face and non-face-to-face time personally spent by the physician and/or other qualified health care professional(s) on the day of the encounter (includes time in activities that require the physician or other qualified health care professional and does not include time in activities normally performed by clinical staff). Physician's time may include the following activities when  performed: Preparing to see the patient (e.g., pre-charting review of records, searching for previously ordered imaging, lab work, and nerve conduction tests) Review of prior analgesic pharmacotherapies. Reviewing PMP Interpreting ordered tests (e.g., lab work, imaging, nerve conduction tests) Performing post-procedure evaluations, including interpretation of diagnostic procedures Obtaining and/or reviewing separately obtained history Performing a medically appropriate examination and/or evaluation Counseling and educating the patient/family/caregiver Ordering medications, tests, or procedures Referring and communicating with other health care professionals (when not separately reported) Documenting clinical information in the electronic or other health record Independently interpreting results (not separately reported) and communicating results to the patient/ family/caregiver Care coordination (not separately reported)  Note by: Nathan Done, MD Date: 02/15/2023; Time: 8:44 AM

## 2023-02-15 ENCOUNTER — Ambulatory Visit: Payer: Medicaid Other | Attending: Pain Medicine | Admitting: Pain Medicine

## 2023-02-15 ENCOUNTER — Encounter: Payer: Self-pay | Admitting: Pain Medicine

## 2023-02-15 VITALS — BP 141/79 | HR 85 | Temp 98.4°F | Ht 71.0 in | Wt 280.0 lb

## 2023-02-15 DIAGNOSIS — M25551 Pain in right hip: Secondary | ICD-10-CM | POA: Diagnosis not present

## 2023-02-15 DIAGNOSIS — M5459 Other low back pain: Secondary | ICD-10-CM | POA: Diagnosis not present

## 2023-02-15 DIAGNOSIS — M545 Low back pain, unspecified: Secondary | ICD-10-CM | POA: Insufficient documentation

## 2023-02-15 DIAGNOSIS — G8929 Other chronic pain: Secondary | ICD-10-CM | POA: Insufficient documentation

## 2023-02-15 DIAGNOSIS — Z09 Encounter for follow-up examination after completed treatment for conditions other than malignant neoplasm: Secondary | ICD-10-CM | POA: Diagnosis not present

## 2023-02-15 NOTE — Progress Notes (Signed)
Safety precautions to be maintained throughout the outpatient stay will include: orient to surroundings, keep bed in low position, maintain call bell within reach at all times, provide assistance with transfer out of bed and ambulation.  

## 2023-02-15 NOTE — Patient Instructions (Signed)
  ____________________________________________________________________________________________  Pain Prevention Technique  Definition:   A technique used to minimize the effects of an activity known to cause inflammation or swelling, which in turn leads to an increase in pain.  Purpose: To prevent swelling from occurring. It is based on the fact that it is easier to prevent swelling from happening than it is to get rid of it, once it occurs.  Contraindications: Anyone with allergy or hypersensitivity to the recommended medications. Anyone taking anticoagulants (Blood Thinners) (e.g., Coumadin, Warfarin, Plavix, etc.). Patients in Renal Failure or having chronic kidney disease.  Technique: Before you undertake an activity known to cause pain, or a flare-up of your chronic pain, and before you experience any pain, do the following:  On a full stomach, take 4 (four) over the counter Ibuprofens 200mg  tablets (Motrin), for a total of 800 mg. In addition, take over the counter Magnesium 400 to 500 mg, before doing the activity.  Six (6) hours later, again on a full stomach, repeat the Ibuprofen. That night, take a warm shower and stretch under the running warm water.  This technique may be sufficient to abort the pain and discomfort before it happens. Keep in mind that it takes a lot less medication to prevent swelling than it takes to eliminate it once it occurs.   Last Update: 01/27/2023  ____________________________________________________________________________________________

## 2023-04-15 NOTE — Progress Notes (Unsigned)
PROVIDER NOTE: Information contained herein reflects review and annotations entered in association with encounter. Interpretation of such information and data should be left to medically-trained personnel. Information provided to patient can be located elsewhere in the medical record under "Patient Instructions". Document created using STT-dictation technology, any transcriptional errors that may result from process are unintentional.    Patient: Nathan Lambert.  Service Category: E/M  Provider: Oswaldo Done, MD  DOB: 11-Feb-1981  DOS: 04/20/2023  Referring Provider: Smith Robert, MD  MRN: [id number]  Specialty: Interventional Pain Management  PCP: Smith Robert, MD  Type: Established Patient  Setting: Ambulatory outpatient    Location: Office  Delivery: Face-to-face     HPI  [name], a 42 y.o. year old male, is here today because of his No primary diagnosis found.. Mr. Pendergraph primary complain today is No chief complaint on file.  Pertinent problems: Mr. 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); Chronic lower extremity pain (3ry area of Pain) (Bilateral) (L>R); Lumbar facet hypertrophy (Bilateral); Lumbar facet syndrome (Bilateral) (R>L); Neurogenic pain; Spondylosis without myelopathy or radiculopathy, lumbar region; Other specified dorsopathies, sacral and sacrococcygeal region; Lumbar spondylosis; Chronic upper extremity pain (Left); Pain and numbness of left upper extremity; Cervical radiculitis (C6/C7) (Left); DDD (degenerative disc disease), cervical;  Cervicalgia; Osteoarthritis involving multiple joints; Chronic hip pain (4th area of Pain) (Bilateral) (L>R); Greater trochanteric bursitis (Left); Lumbar back pain with radiculopathy affecting left lower extremity (L5 dermatomal distribution); Subacute lumbar radiculopathy (L5) (Left); Chronic low back pain (1ry area of Pain) (Bilateral) w/o sciatica; Decreased range of motion of both hips; Chronic hip pain (Left); Enthesopathy of hip region (Left); Other bursitis of hip (Left); Chronic groin pain (Left); Headache disorder; Cervical radiculopathy; Lumbar radiculopathy; Chronic hip pain (Right); and Lumbar facet joint pain on their pertinent problem list. Pain Assessment: Severity of   is reported as a  /10. Location:    / . Onset:  . Quality:  . Timing:  . Modifying factor(s):  Marland Kitchen Vitals:  vitals were not taken for this visit.  BMI: Estimated body mass index is 39.05 kg/m as calculated from the following:   Height as of 02/15/23: 5\' 11"  (1.803 m).   Weight as of 02/15/23: 280 lb (127 kg). Last encounter: 02/15/2023. Last procedure: 02/01/2023.  Reason for encounter: medication management. ***  RTCB: 07/26/2023   Pharmacotherapy Assessment  Analgesic: Oxycodone IR 10 mg 4 times daily. MME/day: 60 mg/day.   Monitoring: [place] PMP: PDMP reviewed during this encounter.       Pharmacotherapy: No side-effects or adverse reactions reported. Compliance: No problems identified. Effectiveness: Clinically acceptable.  No notes on file  No results found for: "CBDTHCR" No results found for: "D8THCCBX" No results found for: "D9THCCBX"  UDS:  Summary  Date Value Ref Range Status  01/24/2023 Note  Final    Comment:    ==================================================================== Tox[employer] Select 13 (MW) ==================================================================== Specimen Alert ToxAssure, ToxAssure Flex or Mat drug testing: -Engineer, production- Result certification performed at [facility], [address] [place], [place] [address]. [phone] Lab Director Cherylann Ratel, PhrmD. ==================================================================== Test  Result       Flag       Units  Drug Present   Oxycodone                      839                     ng/mg creat   Oxymorphone                    1277                    ng/mg creat   Noroxycodone                   868                     ng/mg creat   Noroxymorphone                 349                     ng/mg creat    Sources of oxycodone are scheduled prescription medications.    Oxymorphone, noroxycodone, and noroxymorphone are expected    metabolites of oxycodone. Oxymorphone is also available as a    scheduled prescription medication.  ==================================================================== Test                      Result    Flag   Units      Ref Range   Creatinine              186              mg/dL      >=02 ==================================================================== Declared Medications:  Medication list was not provided. ==================================================================== For clinical consultation, please call [phone]. ====================================================================       ROS  Constitutional: Denies any fever or chills Gastrointestinal: No reported hemesis, hematochezia, vomiting, or acute GI distress Musculoskeletal: Denies any acute onset joint swelling, redness, loss of ROM, or weakness Neurological: No reported episodes of acute onset apraxia, aphasia, dysarthria, agnosia, amnesia, paralysis, loss of coordination, or loss of consciousness  Medication Review  Magnesium, Magnesium Oxide -Mg Supplement, Oxycodone HCl, azelastine, clobetasol ointment, doxycycline, econazole nitrate, famotidine, fluticasone, gentamicin cream, glipiZIDE, loratadine, montelukast, naloxone, terbinafine, and  testosterone cypionate  History Review  Allergy: Mr. Grammatico is allergic to bactrim [sulfamethoxazole-trimethoprim], pollen extract, sulfa antibiotics, and penicillins. Drug: Mr. Tringale  reports no history of drug use. Alcohol:  reports no history of alcohol use. Tobacco:  reports that he has been smoking cigarettes. He has a 10 pack-year smoking history. He has never used smokeless tobacco. Social: Mr. Fugitt  reports that he has been smoking cigarettes. He has a 10 pack-year smoking history. He has never used smokeless tobacco. He reports that he does not drink alcohol and does not use drugs. Medical:  has a past medical history of Allergy (2012), Chronic low back pain (Primary Area of Pain) (Bilateral) (R>L), Degeneration of lumbar or lumbosacral intervertebral disc (L4-L5) (12/27/2016), Gastroesophageal reflux disease without esophagitis (10/26/2021), Headache, Mixed hyperlipidemia (10/26/2021), and Opiate use (02/15/2017). Surgical: Mr. Watton  has a past surgical history that includes Fracture surgery (Left); Spine surgery (Right, 06/16/2015); and Back surgery. Family: family history includes AAA (abdominal aortic aneurysm) in his mother; Diabetes in his father; Stroke in his mother.  Laboratory Chemistry Profile   Renal Lab Results  Component Value Date   BUN 9 05/23/2022   CREATININE 0.76 05/23/2022   BCR 7 (L) 12/27/2016   GFRAA >60 06/07/2019   GFRNONAA >60 05/23/2022    Hepatic Lab Results  Component Value Date   AST 16 05/23/2022   ALT 12 05/23/2022   ALBUMIN 4.0 05/23/2022   ALKPHOS 51 05/23/2022    Electrolytes Lab Results  Component Value Date   NA 140 05/23/2022   K 3.9 05/23/2022   CL 107 05/23/2022   CALCIUM 9.1 05/23/2022   MG 2.0 06/07/2019    Bone Lab Results  Component Value Date   VD25OH 15.51 (L) 06/07/2019   25OHVITD1 17 (L) 12/27/2016   25OHVITD2 <1.0 12/27/2016   25OHVITD3 17 12/27/2016   TESTOFREE 2.6 (L) 06/08/2017    TESTOSTERONE 116 (L) 06/08/2017    Inflammation (CRP: Acute Phase) (ESR: Chronic Phase) Lab Results  Component Value Date   CRP 2.7 (H) 06/07/2019   ESRSEDRATE 17 (H) 06/07/2019         Note: Above Lab results reviewed.  Recent Imaging Review  DG PAIN CLINIC C-ARM 1-60 MIN NO REPORT Fluoro was used, but no Radiologist interpretation will be provided.  Please refer to "NOTES" tab for provider progress note. Note: Reviewed        Physical Exam  General appearance: Well nourished, well developed, and well hydrated. In no apparent acute distress Mental status: Alert, oriented x 3 (person, place, & time)       Respiratory: No evidence of acute respiratory distress Eyes: PERLA Vitals: There were no vitals taken for this visit. BMI: Estimated body mass index is 39.05 kg/m as calculated from the following:   Height as of 02/15/23: 5\' 11"  (1.803 m).   Weight as of 02/15/23: 280 lb (127 kg). Ideal: Patient weight not recorded  Assessment   Diagnosis Status  1. Chronic pain syndrome   2. Pharmacologic therapy   3. Chronic knee pain (2ry area of Pain) (Left)   4. Encounter for medication management   5. 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 Encounter for chronic pain management   7. Chronic use of opiate for therapeutic purpose   8. Chronic lower extremity pain (3ry area of Pain) (Bilateral) (L>R)   9. Chronic low back pain (1ry area of Pain) (Bilateral) w/o sciatica   10. Chronic hip pain (4th area of Pain) (Bilateral) (L>R)    Controlled Controlled Controlled   Updated Problems: No problems updated.  Plan of Care  Problem-specific:  No problem-specific Assessment & Plan notes found for this encounter.  Mr. Blace Lepre. has a current medication list which includes the following long-term medication(s): azelastine, famotidine, fluticasone, glipizide, loratadine, magnesium, magnesium oxide -mg supplement, montelukast, naloxone, and oxycodone  hcl.  Pharmacotherapy (Medications Ordered): No orders of the defined types were placed in this encounter.  Orders:  No orders of the defined types were placed in this encounter.  Follow-up plan:   No follow-ups on file.      Interventional Therapies  Risk Factors  Considerations:   [place]  GERD  Tobacco abuse  Hypogonadonism     Planned  Pending:      Under consideration:   Possible left SI joint RFA Diagnostic caudal ESI + diagnostic epidurogram  Possible Racz procedure  Diagnostic left IA knee injection (w/ steroid)  Possible left IA Hyalgan knee injections  Diagnostic left Genicular NB  Possible left Genicular nerve RFA    Completed:   Diagnostic/therapeutic left IA  hip joint + bursa injection x1 (11/25/2020) (100/100/0/0)  Diagnostic/therapeutic right IA hip joint + bursa injection x1 (02/01/2023) (100/100/50/50)  Therapeutic right lumbar facet MBB x4 (02/01/2023) (100/100/50/50)  Therapeutic left lumbar facet MBB x3 (05/15/2020) 100/100/50/50)  Therapeutic right lumbar facet RFA x1 (10/11/2017) NR Therapeutic left lumbar facet RFA x1 (11/24/2017) NR Therapeutic right SI joint block x1 (05/19/2017)  Therapeutic left SI joint block x1 (05/19/2017)  Therapeutic right SI joint RFA x1 (10/11/2017)  Therapeutic right L4 TFES2 x1 (05/30/2018)    Therapeutic  Palliative (PRN) options:   Palliative bilateral lumbar facet block #4 Palliative bilateral SI joint block #2  Diagnostic right L4 TFES2 #2    Pharmacotherapy  Nonopioids transferred 05/07/2020: Magnesium, vitamin D3, and calcium       Recent Visits Date Type Provider Dept  02/15/23 Office Visit Delano Metz, MD [facility]  02/01/23 Procedure visit Delano Metz, MD [facility]  01/24/23 Office Visit Delano Metz, MD [facility]  Showing recent visits within past 90 days and meeting all other requirements Future Appointments Date Type Provider Dept  04/20/23  Appointment Delano Metz, MD [facility]  Showing future appointments within next 90 days and meeting all other requirements  I discussed the assessment and treatment plan with the patient. The patient was provided an opportunity to ask questions and all were answered. The patient agreed with the plan and demonstrated an understanding of the instructions.  Patient advised to call back or seek an in-person evaluation if the symptoms or condition worsens.  Duration of encounter: *** minutes.  Total time on encounter, as per AMA guidelines included both the face-to-face and non-face-to-face time personally spent by the physician and/or other qualified health care professional(s) on the day of the encounter (includes time in activities that require the physician or other qualified health care professional and does not include time in activities normally performed by clinical staff). Physician's time may include the following activities when performed: Preparing to see the patient (e.g., pre-charting review of records, searching for previously ordered imaging, lab work, and nerve conduction tests) Review of prior analgesic pharmacotherapies. Reviewing PMP Interpreting ordered tests (e.g., lab work, imaging, nerve conduction tests) Performing post-procedure evaluations, including interpretation of diagnostic procedures Obtaining and/or reviewing separately obtained history Performing a medically appropriate examination and/or evaluation Counseling and educating the patient/family/caregiver Ordering medications, tests, or procedures Referring and communicating with other health care professionals (when not separately reported) Documenting clinical information in the electronic or other health record Independently interpreting results (not separately reported) and communicating results to the patient/ family/caregiver Care coordination (not separately reported)  Note by: Oswaldo Done, MD Date: 04/20/2023; Time: 4:18 PM

## 2023-04-15 NOTE — Patient Instructions (Signed)
____________________________________________________________________________________________  Opioid Pain Medication Update  To: All patients taking opioid pain medications. (I.e.: hydrocodone, hydromorphone, oxycodone, oxymorphone, morphine, codeine, methadone, tapentadol, tramadol, buprenorphine, fentanyl, etc.)  Re: Updated review of side effects and adverse reactions of opioid analgesics, as well as new information about long term effects of this class of medications.  Direct risks of long-term opioid therapy are not limited to opioid addiction and overdose. Potential medical risks include serious fractures, breathing problems during sleep, hyperalgesia, immunosuppression, chronic constipation, bowel obstruction, myocardial infarction, and tooth decay secondary to xerostomia.  Unpredictable adverse effects that can occur even if you take your medication correctly: Cognitive impairment, respiratory depression, and death. Most people think that if they take their medication "correctly", and "as instructed", that they will be safe. Nothing could be farther from the truth. In reality, a significant amount of recorded deaths associated with the use of opioids has occurred in individuals that had taken the medication for a long time, and were taking their medication correctly. The following are examples of how this can happen: Patient taking his/her medication for a long time, as instructed, without any side effects, is given a certain antibiotic or another unrelated medication, which in turn triggers a "Drug-to-drug interaction" leading to disorientation, cognitive impairment, impaired reflexes, respiratory depression or an untoward event leading to serious bodily harm or injury, including death.  Patient taking his/her medication for a long time, as instructed, without any side effects, develops an acute impairment of liver and/or kidney function. This will lead to a rapid inability of the body to  breakdown and eliminate their pain medication, which will result in effects similar to an "overdose", but with the same medicine and dose that they had always taken. This again may lead to disorientation, cognitive impairment, impaired reflexes, respiratory depression or an untoward event leading to serious bodily harm or injury, including death.  A similar problem will occur with patients as they grow older and their liver and kidney function begins to decrease as part of the aging process.  Background information: Historically, the original case for using long-term opioid therapy to treat chronic noncancer pain was based on safety assumptions that subsequent experience has called into question. In 1996, the American Pain Society and the American Academy of Pain Medicine issued a consensus statement supporting long-term opioid therapy. This statement acknowledged the dangers of opioid prescribing but concluded that the risk for addiction was low; respiratory depression induced by opioids was short-lived, occurred mainly in opioid-naive patients, and was antagonized by pain; tolerance was not a common problem; and efforts to control diversion should not constrain opioid prescribing. This has now proven to be wrong. Experience regarding the risks for opioid addiction, misuse, and overdose in community practice has failed to support these assumptions.  According to the Centers for Disease Control and Prevention, fatal overdoses involving opioid analgesics have increased sharply over the past decade. Currently, more than 96,700 people die from drug overdoses every year. Opioids are a factor in 7 out of every 10 overdose deaths. Deaths from drug overdose have surpassed motor vehicle accidents as the leading cause of death for individuals between the ages of 80 and 61.  Clinical data suggest that neuroendocrine dysfunction may be very common in both men and women, potentially causing hypogonadism, erectile  dysfunction, infertility, decreased libido, osteoporosis, and depression. Recent studies linked higher opioid dose to increased opioid-related mortality. Controlled observational studies reported that long-term opioid therapy may be associated with increased risk for cardiovascular events. Subsequent meta-analysis concluded  that the safety of long-term opioid therapy in elderly patients has not been proven.   Side Effects and adverse reactions: Common side effects: Drowsiness (sedation). Dizziness. Nausea and vomiting. Constipation. Physical dependence -- Dependence often manifests with withdrawal symptoms when opioids are discontinued or decreased. Tolerance -- As you take repeated doses of opioids, you require increased medication to experience the same effect of pain relief. Respiratory depression -- This can occur in healthy people, especially with higher doses. However, people with COPD, asthma or other lung conditions may be even more susceptible to fatal respiratory impairment.  Uncommon side effects: An increased sensitivity to feeling pain and extreme response to pain (hyperalgesia). Chronic use of opioids can lead to this. Delayed gastric emptying (the process by which the contents of your stomach are moved into your small intestine). Muscle rigidity. Immune system and hormonal dysfunction. Quick, involuntary muscle jerks (myoclonus). Arrhythmia. Itchy skin (pruritus). Dry mouth (xerostomia).  Long-term side effects: Chronic constipation. Sleep-disordered breathing (SDB). Increased risk of bone fractures. Hypothalamic-pituitary-adrenal dysregulation. Increased risk of overdose.  RISKS: Respiratory depression and death: Opioids increase the risk of respiratory depression and death.  Drug-to-drug interactions: Opioids are relatively contraindicated in combination with benzodiazepines, sleep inducers, and other central nervous system depressants. Other classes of medications  (i.e.: certain antibiotics and even over-the-counter medications) may also trigger or induce respiratory depression in some patients.  Medical conditions: Patients with pre-existing respiratory problems are at higher risk of respiratory failure and/or depression when in combination with opioid analgesics. Opioids are relatively contraindicated in some medical conditions such as central sleep apnea.   Fractures and Falls:  Opioids increase the risk and incidence of falls. This is of particular importance in elderly patients.  Endocrine System:  Long-term administration is associated with endocrine abnormalities (endocrinopathies). (Also known as Opioid-induced Endocrinopathy) Influences on both the hypothalamic-pituitary-adrenal axis?and the hypothalamic-pituitary-gonadal axis have been demonstrated with consequent hypogonadism and adrenal insufficiency in both sexes. Hypogonadism and decreased levels of dehydroepiandrosterone sulfate have been reported in men and women. Endocrine effects include: Amenorrhoea in women (abnormal absence of menstruation) Reduced libido in both sexes Decreased sexual function Erectile dysfunction in men Hypogonadisms (decreased testicular function with shrinkage of testicles) Infertility Depression and fatigue Loss of muscle mass Anxiety Depression Immune suppression Hyperalgesia Weight gain Anemia Osteoporosis Patients (particularly women of childbearing age) should avoid opioids. There is insufficient evidence to recommend routine monitoring of asymptomatic patients taking opioids in the long-term for hormonal deficiencies.  Immune System: Human studies have demonstrated that opioids have an immunomodulating effect. These effects are mediated via opioid receptors both on immune effector cells and in the central nervous system. Opioids have been demonstrated to have adverse effects on antimicrobial response and anti-tumour surveillance. Buprenorphine has  been demonstrated to have no impact on immune function.  Opioid Induced Hyperalgesia: Human studies have demonstrated that prolonged use of opioids can lead to a state of abnormal pain sensitivity, sometimes called opioid induced hyperalgesia (OIH). Opioid induced hyperalgesia is not usually seen in the absence of tolerance to opioid analgesia. Clinically, hyperalgesia may be diagnosed if the patient on long-term opioid therapy presents with increased pain. This might be qualitatively and anatomically distinct from pain related to disease progression or to breakthrough pain resulting from development of opioid tolerance. Pain associated with hyperalgesia tends to be more diffuse than the pre-existing pain and less defined in quality. Management of opioid induced hyperalgesia requires opioid dose reduction.  Cancer: Chronic opioid therapy has been associated with an increased risk of cancer  among noncancer patients with chronic pain. This association was more evident in chronic strong opioid users. Chronic opioid consumption causes significant pathological changes in the small intestine and colon. Epidemiological studies have found that there is a link between opium dependence and initiation of gastrointestinal cancers. Cancer is the second leading cause of death after cardiovascular disease. Chronic use of opioids can cause multiple conditions such as GERD, immunosuppression and renal damage as well as carcinogenic effects, which are associated with the incidence of cancers.   Mortality: Long-term opioid use has been associated with increased mortality among patients with chronic non-cancer pain (CNCP).  Prescription of long-acting opioids for chronic noncancer pain was associated with a significantly increased risk of all-cause mortality, including deaths from causes other than overdose.  Reference: Von Korff M, Kolodny A, Deyo RA, Chou R. Long-term opioid therapy reconsidered. Ann Intern Med. 2011  Sep 6;155(5):325-8. doi: 10.7326/[id number]. PMID: 64403474; PMCID: [id number]. Randon Goldsmith, Hayward RA, Dunn KM, [place] KP. Risk of adverse events in patients prescribed long-term opioids: A cohort study in the [place] [facility] Research Datalink. Eur J Pain. 2019 May;23(5):908-922. doi: 10.1002/ejp.1357. Epub 2019 Jan 31. PMID: 43329518. Colameco S, Coren JS, Ciervo CA. Continuous opioid treatment for chronic noncancer pain: a time for moderation in prescribing. Postgrad Med. 2009 Jul;121(4):61-6. doi: 10.3810/pgm.2009.07.2032. PMID: 84166063. William Hamburger RN, [place] SD, Blazina I, Cristopher Peru, Bougatsos C, Deyo RA. The effectiveness and risks of long-term opioid therapy for chronic pain: a systematic review for a [employer] of [employer] Pathways to [employer]. Ann Intern Med. 2015 Feb 17;162(4):276-86. doi: 10.7326/M14-2559. PMID: 01601093. Caryl Bis [facility], Makuc DM. NCHS Data Brief No. 22. [place]: Centers for Disease Control and Prevention; 2009. Sep, Increase in Fatal Poisonings Involving Opioid Analgesics in the [place], 1999-2006. Song IA, Choi HR, Oh TK. Long-term opioid use and mortality in patients with chronic non-cancer pain: Ten-year follow-up study in [place] from 2010 through 2019. EClinicalMedicine. 2022 Jul 18;51:101558. doi: 10.1016/j.eclinm.2022.235573. PMID: 22025427; PMCID: [id number]. Huser, W., Schubert, T., Vogelmann, T. et al. All-cause mortality in patients with long-term opioid therapy compared with non-opioid analgesics for chronic non-cancer pain: a database study. BMC Med 18, 162 (2020). http://lester.info/ Rashidian H, Karie Kirks, Malekzadeh R, Haghdoost AA. An Ecological Study of the Association between Opiate Use and Incidence of Cancers. Addict Health. 2016 Fall;8(4):252-260. PMID: 51761607; PMCID: [id number].  Our Goal: Our goal is to control your  pain with means other than the use of opioid pain medications.  Our Recommendation: Talk to your physician about coming off of these medications. We can assist you with the tapering down and stopping these medicines. Based on the new information, even if you cannot completely stop the medication, a decrease in the dose may be associated with a lesser risk. Ask for other means of controlling the pain. Decrease or eliminate those factors that significantly contribute to your pain such as smoking, obesity, and a diet heavily tilted towards "inflammatory" nutrients.  Last Updated: 01/24/2023   ____________________________________________________________________________________________     ____________________________________________________________________________________________  National Pain Medication Shortage  The [place] is experiencing worsening drug shortages. These have had a negative widespread effect on patient care and treatment. Not expected to improve any time soon. Predicted to last past 2029.   Drug shortage list (generic names) Oxycodone IR Oxycodone/APAP Oxymorphone IR Hydromorphone Hydrocodone/APAP Morphine  Where is the problem?  Manufacturing and supply level.  Will this shortage affect you?  Only if you  take any of the above pain medications.  How? You may be unable to fill your prescription.  Your pharmacist may offer a "partial fill" of your prescription. (Warning: Do not accept partial fills.) Prescriptions partially filled cannot be transferred to another pharmacy. Read our Medication Rules and Regulation. Depending on how much medicine you are dependent on, you may experience withdrawals when unable to get the medication.  Recommendations: Consider ending your dependence on opioid pain medications. Ask your pain specialist to assist you with the process. Consider switching to a medication currently not in shortage, such as Buprenorphine. Talk to your pain  specialist about this option. Consider decreasing your pain medication requirements by managing tolerance thru "Drug Holidays". This may help minimize withdrawals, should you run out of medicine. Control your pain thru the use of non-pharmacological interventional therapies.   Your prescriber: Prescribers cannot be blamed for shortages. Medication manufacturing and supply issues cannot be fixed by the prescriber.   NOTE: The prescriber is not responsible for supplying the medication, or solving supply issues. Work with your pharmacist to solve it. The patient is responsible for the decision to take or continue taking the medication and for identifying and securing a legal supply source. By law, supplying the medication is the job and responsibility of the pharmacy. The prescriber is responsible for the evaluation, monitoring, and prescribing of these medications.   Prescribers will NOT: Re-issue prescriptions that have been partially filled. Re-issue prescriptions already sent to a pharmacy.  Re-send prescriptions to a different pharmacy because yours did not have your medication. Ask pharmacist to order more medicine or transfer the prescription to another pharmacy. (Read below.)  New 2023 regulation: "March 19, 2022 Revised Regulation Allows DEA-Registered Pharmacies to Transfer Electronic Prescriptions at a Patient's Request DEA Headquarters Division - Public Information Office Patients now have the ability to request their electronic prescription be transferred to another pharmacy without having to go back to their practitioner to initiate the request. This revised regulation went into effect on Monday, March 15, 2022.     At a patient's request, a DEA-registered retail pharmacy can now transfer an electronic prescription for a controlled substance (schedules II-V) to another DEA-registered retail pharmacy. Prior to this change, patients would have to go through their practitioner to  cancel their prescription and have it re-issued to a different pharmacy. The process was taxing and time consuming for both patients and practitioners.    The Drug Enforcement Administration [facility]) published its intent to revise the process for transferring electronic prescriptions on June 06, 2020.  The final rule was published in the federal register on February 11, 2022 and went into effect 30 days later.  Under the final rule, a prescription can only be transferred once between pharmacies, and only if allowed under existing state or other applicable law. The prescription must remain in its electronic form; may not be altered in any way; and the transfer must be communicated directly between two licensed pharmacists. It's important to note, any authorized refills transfer with the original prescription, which means the entire prescription will be filled at the same pharmacy".  Reference: [email or web address].is [facility] website announcement)  [email or web address] [employer] of Justice)   [employer] / Vol. 88, No. 143 / Thursday, February 11, 2022 / Rules and Regulations DEPARTMENT OF JUSTICE  Drug Enforcement Administration  21 CFR Part 1306  [Docket No. [id number]]  RIN [id number] Transfer of Electronic Prescriptions for Schedules II-V Controlled Substances Between Pharmacies for Initial Filling  ____________________________________________________________________________________________  ____________________________________________________________________________________________  Transfer of Pain Medication between Pharmacies  Re: 2023 DEA Clarification on existing regulation  Published on DEA Website: March 19, 2022  Title: Revised Regulation Allows DEA-Registered Pharmacies to Electrical engineer Prescriptions at a Patient's  Request DEA Headquarters Division - [employer]  "Patients now have the ability to request their electronic prescription be transferred to another pharmacy without having to go back to their practitioner to initiate the request. This revised regulation went into effect on Monday, March 15, 2022.     At a patient's request, a DEA-registered retail pharmacy can now transfer an electronic prescription for a controlled substance (schedules II-V) to another DEA-registered retail pharmacy. Prior to this change, patients would have to go through their practitioner to cancel their prescription and have it re-issued to a different pharmacy. The process was taxing and time consuming for both patients and practitioners.    The Drug Enforcement Administration [facility]) published its intent to revise the process for transferring electronic prescriptions on June 06, 2020.  The final rule was published in the federal register on February 11, 2022 and went into effect 30 days later.  Under the final rule, a prescription can only be transferred once between pharmacies, and only if allowed under existing state or other applicable law. The prescription must remain in its electronic form; may not be altered in any way; and the transfer must be communicated directly between two licensed pharmacists. It's important to note, any authorized refills transfer with the original prescription, which means the entire prescription will be filled at the same pharmacy."    REFERENCES: 1. DEA website announcement [email or web address]  2. Department of Justice website  [email or web address]  3. DEPARTMENT OF JUSTICE Drug Enforcement Administration 21 CFR Part 1306 [Docket No. [id number]] RIN [id number] "Transfer of Electronic Prescriptions for Schedules II-V Controlled Substances  Between Pharmacies for Initial Filling"  ____________________________________________________________________________________________     _______________________________________________________________________  Medication Rules  Purpose: To inform patients, and their family members, of our medication rules and regulations.  Applies to: All patients receiving prescriptions from our practice (written or electronic).  Pharmacy of record: This is the pharmacy where your electronic prescriptions will be sent. Make sure we have the correct one.  Electronic prescriptions: In compliance with the [facility] Strengthen Opioid Misuse Prevention (STOP) Act of 2017 (Session Conni Elliot [phone]), effective July 19, 2018, all controlled substances must be electronically prescribed. Written prescriptions, faxing, or calling prescriptions to a pharmacy will no longer be done.  Prescription refills: These will be provided only during in-person appointments. No medications will be renewed without a "face-to-face" evaluation with your provider. Applies to all prescriptions.  NOTE: The following applies primarily to controlled substances (Opioid* Pain Medications).   Type of encounter (visit): For patients receiving controlled substances, face-to-face visits are required. (Not an option and not up to the patient.)  Patient's responsibilities: Pain Pills: Bring all pain pills to every appointment (except for procedure appointments). Pill Bottles: Bring pills in original pharmacy bottle. Bring bottle, even if empty. Always bring the bottle of the most recent fill.  Medication refills: You are responsible for knowing and keeping track of what medications you are taking and when is it that you will need a refill. The day before your appointment: write a list of all prescriptions that need to be refilled. The day of the appointment: give the list to the admitting nurse. Prescriptions will be written only  during appointments. No prescriptions will be written on procedure days. If you forget a  medication: it will not be "Called in", "Faxed", or "electronically sent". You will need to get another appointment to get these prescribed. No early refills. Do not call asking to have your prescription filled early. Partial  or short prescriptions: Occasionally your pharmacy may not have enough pills to fill your prescription.  NEVER ACCEPT a partial fill or a prescription that is short of the total amount of pills that you were prescribed.  With controlled substances the law allows 72 hours for the pharmacy to complete the prescription.  If the prescription is not completed within 72 hours, the pharmacist will require a new prescription to be written. This means that you will be short on your medicine and we WILL NOT send another prescription to complete your original prescription.  Instead, request the pharmacy to send a carrier to a nearby branch to get enough medication to provide you with your full prescription. Prescription Accuracy: You are responsible for carefully inspecting your prescriptions before leaving our office. Have the discharge nurse carefully go over each prescription with you, before taking them home. Make sure that your name is accurately spelled, that your address is correct. Check the name and dose of your medication to make sure it is accurate. Check the number of pills, and the written instructions to make sure they are clear and accurate. Make sure that you are given enough medication to last until your next medication refill appointment. Taking Medication: Take medication as prescribed. When it comes to controlled substances, taking less pills or less frequently than prescribed is permitted and encouraged. Never take more pills than instructed. Never take the medication more frequently than prescribed.  Inform other Doctors: Always inform, all of your healthcare providers, of all the  medications you take. Pain Medication from other Providers: You are not allowed to accept any additional pain medication from any other Doctor or Healthcare provider. There are two exceptions to this rule. (see below) In the event that you require additional pain medication, you are responsible for notifying us, as stated below. Cough Medicine: Often these contain an opioid, such as codeine or hydrocodone. Never accept or take cough medicine containing these opioids if you are already taking an opioid* medication. The combination may cause respiratory failure and death. Medication Agreement: You are responsible for carefully reading and following our Medication Agreement. This must be signed before receiving any prescriptions from our practice. Safely store a copy of your signed Agreement. Violations to the Agreement will result in no further prescriptions. (Additional copies of our Medication Agreement are available upon request.) Laws, Rules, & Regulations: All patients are expected to follow all [address] and [employer], [employer], Rules, [place] [employer]. Ignorance of the Laws does not constitute a valid excuse.  Illegal drugs and Controlled Substances: The use of illegal substances (including, but not limited to marijuana and its derivatives) and/or the illegal use of any controlled substances is strictly prohibited. Violation of this rule may result in the immediate and permanent discontinuation of any and all prescriptions being written by our practice. The use of any illegal substances is prohibited. Adopted CDC guidelines & recommendations: Target dosing levels will be at or below 60 MME/day. Use of benzodiazepines** is not recommended.  Exceptions: There are only two exceptions to the rule of not receiving pain medications from other Healthcare Providers. Exception #1 (Emergencies): In the event of an emergency (i.e.: accident requiring emergency care), you are allowed to receive additional pain  medication. However, you are responsible for: As soon as  you are able, call our office [phone], at any time of the day or night, and leave a message stating your name, the date and nature of the emergency, and the name and dose of the medication prescribed. In the event that your call is answered by a member of our staff, make sure to document and save the date, time, and the name of the person that took your information.  Exception #2 (Planned Surgery): In the event that you are scheduled by another doctor or dentist to have any type of surgery or procedure, you are allowed (for a period no longer than 30 days), to receive additional pain medication, for the acute post-op pain. However, in this case, you are responsible for picking up a copy of our "Post-op Pain Management for Surgeons" handout, and giving it to your surgeon or dentist. This document is available at our office, and does not require an appointment to obtain it. Simply go to our office during business hours (Monday-Thursday from 8:00 AM to 4:00 PM) (Friday 8:00 AM to 12:00 Noon) or if you have a scheduled appointment with [place], prior to your surgery, and ask for it by name. In addition, you are responsible for: calling our office [phone], at any time of the day or night, and leaving a message stating your name, name of your surgeon, type of surgery, and date of procedure or surgery. Failure to comply with your responsibilities may result in termination of therapy involving the controlled substances. Medication Agreement Violation. Following the above rules, including your responsibilities will help you in avoiding a Medication Agreement Violation ("Breaking your Pain Medication Contract").  Consequences:  Not following the above rules may result in permanent discontinuation of medication prescription therapy.  *Opioid medications include: morphine, codeine, oxycodone, oxymorphone, hydrocodone, hydromorphone, meperidine, tramadol,  tapentadol, buprenorphine, fentanyl, methadone. **Benzodiazepine medications include: diazepam (Valium), alprazolam (Xanax), clonazepam (Klonopine), lorazepam (Ativan), clorazepate (Tranxene), chlordiazepoxide (Librium), estazolam (Prosom), oxazepam (Serax), temazepam (Restoril), triazolam (Halcion) (Last updated: 05/11/2022) ______________________________________________________________________    ______________________________________________________________________  Medication Recommendations and Reminders  Applies to: All patients receiving prescriptions (written and/or electronic).  Medication Rules & Regulations: You are responsible for reading, knowing, and following our "Medication Rules" document. These exist for your safety and that of others. They are not flexible and neither are we. Dismissing or ignoring them is an act of "non-compliance" that may result in complete and irreversible termination of such medication therapy. For safety reasons, "non-compliance" will not be tolerated. As with the [place] fundamental legal principle of "ignorance of the law is no defense", we will accept no excuses for not having read and knowing the content of documents provided to you by our practice.  Pharmacy of record:  Definition: This is the pharmacy where your electronic prescriptions will be sent.  We do not endorse any particular pharmacy. It is up to you and your insurance to decide what pharmacy to use.  We do not restrict you in your choice of pharmacy. However, once we write for your prescriptions, we will NOT be re-sending more prescriptions to fix restricted supply problems created by your pharmacy, or your insurance.  The pharmacy listed in the electronic medical record should be the one where you want electronic prescriptions to be sent. If you choose to change pharmacy, simply notify our nursing staff. Changes will be made only during your regular appointments and not over the  phone.  Recommendations: Keep all of your pain medications in a safe place, under lock and key, even  if you live alone. We will NOT replace lost, stolen, or damaged medication. We do not accept "Police Reports" as proof of medications having been stolen. After you fill your prescription, take 1 week's worth of pills and put them away in a safe place. You should keep a separate, properly labeled bottle for this purpose. The remainder should be kept in the original bottle. Use this as your primary supply, until it runs out. Once it's gone, then you know that you have 1 week's worth of medicine, and it is time to come in for a prescription refill. If you do this correctly, it is unlikely that you will ever run out of medicine. To make sure that the above recommendation works, it is very important that you make sure your medication refill appointments are scheduled at least 1 week before you run out of medicine. To do this in an effective manner, make sure that you do not leave the office without scheduling your next medication management appointment. Always ask the nursing staff to show you in your prescription , when your medication will be running out. Then arrange for the receptionist to get you a return appointment, at least 7 days before you run out of medicine. Do not wait until you have 1 or 2 pills left, to come in. This is very poor planning and does not take into consideration that we may need to cancel appointments due to bad weather, sickness, or emergencies affecting our staff. DO NOT ACCEPT A "Partial Fill": If for any reason your pharmacy does not have enough pills/tablets to completely fill or refill your prescription, do not allow for a "partial fill". The law allows the pharmacy to complete that prescription within 72 hours, without requiring a new prescription. If they do not fill the rest of your prescription within those 72 hours, you will need a separate prescription to fill the remaining  amount, which we will NOT provide. If the reason for the partial fill is your insurance, you will need to talk to the pharmacist about payment alternatives for the remaining tablets, but again, DO NOT ACCEPT A PARTIAL FILL, unless you can trust your pharmacist to obtain the remainder of the pills within 72 hours.  Prescription refills and/or changes in medication(s):  Prescription refills, and/or changes in dose or medication, will be conducted only during scheduled medication management appointments. (Applies to both, written and electronic prescriptions.) No refills on procedure days. No medication will be changed or started on procedure days. No changes, adjustments, and/or refills will be conducted on a procedure day. Doing so will interfere with the diagnostic portion of the procedure. No phone refills. No medications will be "called into the pharmacy". No Fax refills. No weekend refills. No Holliday refills. No after hours refills.  Remember:  Business hours are:  Monday to Thursday 8:00 AM to 4:00 PM Provider's Schedule: Delano Metz, MD - Appointments are:  Medication management: Monday and Wednesday 8:00 AM to 4:00 PM Procedure day: Tuesday and Thursday 7:30 AM to 4:00 PM Edward Jolly, MD - Appointments are:  Medication management: Tuesday and Thursday 8:00 AM to 4:00 PM Procedure day: Monday and Wednesday 7:30 AM to 4:00 PM (Last update: 05/11/2022) ______________________________________________________________________   ____________________________________________________________________________________________  Naloxone Nasal Spray  Why am I receiving this medication? [place] STOP ACT requires that all patients taking high dose opioids or at risk of opioids respiratory depression, be prescribed an opioid reversal agent, such as Naloxone (AKA: Narcan).  What is this medication? NALOXONE (  nal OX one) treats opioid overdose, which causes slow or shallow breathing,  severe drowsiness, or trouble staying awake. Call emergency services after using this medication. You may need additional treatment. Naloxone works by reversing the effects of opioids. It belongs to a group of medications called opioid blockers.  COMMON BRAND NAME(S): Kloxxado, Narcan  What should I tell my care team before I take this medication? They need to know if you have any of these conditions: Heart disease Substance use disorder An unusual or allergic reaction to naloxone, other medications, foods, dyes, or preservatives Pregnant or trying to get pregnant Breast-feeding  When to use this medication? This medication is to be used for the treatment of respiratory depression (less than 8 breaths per minute) secondary to opioid overdose.   How to use this medication? This medication is for use in the nose. Lay the person on their back. Support their neck with your hand and allow the head to tilt back before giving the medication. The nasal spray should be given into 1 nostril. After giving the medication, move the person onto their side. Do not remove or test the nasal spray until ready to use. Get emergency medical help right away after giving the first dose of this medication, even if the person wakes up. You should be familiar with how to recognize the signs and symptoms of a narcotic overdose. If more doses are needed, give the additional dose in the other nostril. Talk to your care team about the use of this medication in children. While this medication may be prescribed for children as young as newborns for selected conditions, precautions do apply.  Naloxone Overdosage: If you think you have taken too much of this medicine contact a poison control center or emergency room at once.  NOTE: This medicine is only for you. Do not share this medicine with others.  What if I miss a dose? This does not apply.  What may interact with this medication? This is only used during an  emergency. No interactions are expected during emergency use. This list may not describe all possible interactions. Give your health care provider a list of all the medicines, herbs, non-prescription drugs, or dietary supplements you use. Also tell them if you smoke, drink alcohol, or use illegal drugs. Some items may interact with your medicine.  What should I watch for while using this medication? Keep this medication ready for use in the case of an opioid overdose. Make sure that you have the phone number of your care team and local hospital ready. You may need to have additional doses of this medication. Each nasal spray contains a single dose. Some emergencies may require additional doses. After use, bring the treated person to the nearest hospital or call 911. Make sure the treating care team knows that the person has received a dose of this medication. You will receive additional instructions on what to do during and after use of this medication before an emergency occurs.  What side effects may I notice from receiving this medication? Side effects that you should report to your care team as soon as possible: Allergic reactions--skin rash, itching, hives, swelling of the face, lips, tongue, or throat Side effects that usually do not require medical attention (report these to your care team if they continue or are bothersome): Constipation Dryness or irritation inside the nose Headache Increase in blood pressure Muscle spasms Stuffy nose Toothache This list may not describe all possible side effects. Call your doctor for  medical advice about side effects. You may report side effects to FDA at 1-800-FDA-1088.  Where should I keep my medication? Because this is an emergency medication, you should keep it with you at all times.  Keep out of the reach of children and pets. Store between 20 and 25 degrees C (68 and 77 degrees F). Do not freeze. Throw away any unused medication after the  expiration date. Keep in original box until ready to use.  NOTE: This sheet is a summary. It may not cover all possible information. If you have questions about this medicine, talk to your doctor, pharmacist, or health care provider.   2023 Elsevier/Gold Standard (2021-03-13 00:00:00)  ____________________________________________________________________________________________

## 2023-04-20 ENCOUNTER — Ambulatory Visit: Payer: BLUE CROSS/BLUE SHIELD | Attending: Pain Medicine | Admitting: Pain Medicine

## 2023-04-20 ENCOUNTER — Encounter: Payer: Self-pay | Admitting: Pain Medicine

## 2023-04-20 VITALS — BP 128/83 | HR 70 | Temp 97.9°F | Ht 71.0 in | Wt 280.0 lb

## 2023-04-20 DIAGNOSIS — M961 Postlaminectomy syndrome, not elsewhere classified: Secondary | ICD-10-CM | POA: Diagnosis present

## 2023-04-20 DIAGNOSIS — M545 Low back pain, unspecified: Secondary | ICD-10-CM | POA: Diagnosis present

## 2023-04-20 DIAGNOSIS — M25552 Pain in left hip: Secondary | ICD-10-CM | POA: Diagnosis present

## 2023-04-20 DIAGNOSIS — Z79899 Other long term (current) drug therapy: Secondary | ICD-10-CM

## 2023-04-20 DIAGNOSIS — M79605 Pain in left leg: Secondary | ICD-10-CM | POA: Diagnosis present

## 2023-04-20 DIAGNOSIS — M79604 Pain in right leg: Secondary | ICD-10-CM

## 2023-04-20 DIAGNOSIS — Z79891 Long term (current) use of opiate analgesic: Secondary | ICD-10-CM | POA: Diagnosis present

## 2023-04-20 DIAGNOSIS — G894 Chronic pain syndrome: Secondary | ICD-10-CM

## 2023-04-20 DIAGNOSIS — M25551 Pain in right hip: Secondary | ICD-10-CM | POA: Diagnosis present

## 2023-04-20 DIAGNOSIS — G8929 Other chronic pain: Secondary | ICD-10-CM | POA: Insufficient documentation

## 2023-04-20 DIAGNOSIS — M25562 Pain in left knee: Secondary | ICD-10-CM

## 2023-04-20 MED ORDER — OXYCODONE HCL 10 MG PO TABS
10.0000 mg | ORAL_TABLET | Freq: Four times a day (QID) | ORAL | 0 refills | Status: DC | PRN
Start: 2023-06-26 — End: 2023-07-21

## 2023-04-20 MED ORDER — OXYCODONE HCL 10 MG PO TABS
10.0000 mg | ORAL_TABLET | Freq: Four times a day (QID) | ORAL | 0 refills | Status: DC | PRN
Start: 2023-05-27 — End: 2023-07-21

## 2023-04-20 MED ORDER — OXYCODONE HCL 10 MG PO TABS
10.0000 mg | ORAL_TABLET | Freq: Four times a day (QID) | ORAL | 0 refills | Status: DC | PRN
Start: 2023-04-27 — End: 2023-07-21

## 2023-04-20 MED ORDER — NALOXONE HCL 4 MG/0.1ML NA LIQD: 1.0000 | NASAL | 0 refills | Status: DC | PRN

## 2023-04-20 NOTE — Progress Notes (Signed)
Nursing Pain Medication Assessment:  Safety precautions to be maintained throughout the outpatient stay will include: orient to surroundings, keep bed in low position, maintain call bell within reach at all times, provide assistance with transfer out of bed and ambulation.  Medication Inspection Compliance: Pill count conducted under aseptic conditions, in front of the patient. Neither the pills nor the bottle was removed from the patient's sight at any time. Once count was completed pills were immediately returned to the patient in their original bottle.  Medication: Oxycodone IR Pill/Patch Count:  33 of 120 pills remain Pill/Patch Appearance: Markings consistent with prescribed medication Bottle Appearance: Standard pharmacy container. Clearly labeled. Filled Date: 70 / 10 / 2024 Last Medication intake:  TodaySafety precautions to be maintained throughout the outpatient stay will include: orient to surroundings, keep bed in low position, maintain call bell within reach at all times, provide assistance with transfer out of bed and ambulation.

## 2023-07-20 ENCOUNTER — Other Ambulatory Visit: Payer: Self-pay

## 2023-07-20 ENCOUNTER — Emergency Department (HOSPITAL_COMMUNITY): Payer: BLUE CROSS/BLUE SHIELD

## 2023-07-20 ENCOUNTER — Emergency Department (HOSPITAL_COMMUNITY)
Admission: EM | Admit: 2023-07-20 | Discharge: 2023-07-21 | Disposition: A | Discharge status: Home / Self Care | Payer: BLUE CROSS/BLUE SHIELD | Attending: Emergency Medicine | Admitting: Emergency Medicine

## 2023-07-20 ENCOUNTER — Encounter (HOSPITAL_COMMUNITY): Payer: Self-pay

## 2023-07-20 DIAGNOSIS — L0501 Pilonidal cyst with abscess: Secondary | ICD-10-CM | POA: Insufficient documentation

## 2023-07-20 DIAGNOSIS — M542 Cervicalgia: Secondary | ICD-10-CM | POA: Diagnosis present

## 2023-07-20 DIAGNOSIS — L0211 Cutaneous abscess of neck: Secondary | ICD-10-CM | POA: Diagnosis not present

## 2023-07-20 DIAGNOSIS — L0291 Cutaneous abscess, unspecified: Secondary | ICD-10-CM

## 2023-07-20 LAB — CBC WITH DIFFERENTIAL/PLATELET
Abs Immature Granulocytes: 0.02 10*3/uL (ref 0.00–0.07)
Basophils Absolute: 0 10*3/uL (ref 0.0–0.1)
Basophils Relative: 0 %
Eosinophils Absolute: 0.1 10*3/uL (ref 0.0–0.5)
Eosinophils Relative: 1 %
HCT: 41.6 % (ref 39.0–52.0)
Hemoglobin: 14 g/dL (ref 13.0–17.0)
Immature Granulocytes: 0 %
Lymphocytes Relative: 28 %
Lymphs Abs: 2.4 10*3/uL (ref 0.7–4.0)
MCH: 31.2 pg (ref 26.0–34.0)
MCHC: 33.7 g/dL (ref 30.0–36.0)
MCV: 92.7 fL (ref 80.0–100.0)
Monocytes Absolute: 0.9 10*3/uL (ref 0.1–1.0)
Monocytes Relative: 10 %
Neutro Abs: 5.1 10*3/uL (ref 1.7–7.7)
Neutrophils Relative %: 61 %
Platelets: 249 10*3/uL (ref 150–400)
RBC: 4.49 MIL/uL (ref 4.22–5.81)
RDW: 14.5 % (ref 11.5–15.5)
WBC: 8.4 10*3/uL (ref 4.0–10.5)
nRBC: 0 % (ref 0.0–0.2)

## 2023-07-20 LAB — BASIC METABOLIC PANEL
Anion gap: 9 (ref 5–15)
BUN: 11 mg/dL (ref 6–20)
CO2: 24 mmol/L (ref 22–32)
Calcium: 9.2 mg/dL (ref 8.9–10.3)
Chloride: 105 mmol/L (ref 98–111)
Creatinine, Ser: 0.9 mg/dL (ref 0.61–1.24)
GFR, Estimated: >60 mL/min (ref >60)
Glucose, Bld: 113 mg/dL — ABNORMAL HIGH (ref 70–99)
Potassium: 4.3 mmol/L (ref 3.5–5.1)
Sodium: 138 mmol/L (ref 135–145)

## 2023-07-20 NOTE — ED Provider Triage Note (Signed)
 Emergency Medicine Provider Triage Evaluation Note  Bernett LITTIE Madelynn Mickey. , a 43 y.o. male  was evaluated in triage.  Pt complains of swelling under the chin, states he is always had a bump under his chin but over the past 2 days got extremely large, tender and today he was having of pain and slight difficulty with swallowing though he states that his difficulty with swallowing seems to be improved since coming to the ED.SABRA  He also notes that his pilonidal abscess is flaring up  Review of Systems  Positive: Submandibular swelling Negative: Fever, chills, difficulty with breathing  Physical Exam  BP (!) 135/92 (BP Location: Right Arm)   Pulse 77   Temp 98.6 F (37 C) (Oral)   Resp 17   Ht 5' 11 (1.803 m)   Wt 133.7 kg   SpO2 99%   BMI 41.10 kg/m  Gen:   Awake, no distress   Resp:  Normal effort  MSK:   Moves extremities without difficulty  Other:  Patient speaks in full and clear sentences, handles oral secretions well has large submandibular indurated mass that is tender with no crepitus.  Patient also has tender occiput mass of gluteal cleft on right  Medical Decision Making  Medically screening exam initiated at 11:05 PM.  Appropriate orders placed.  Beldon L Vandevelde Jr. was informed that the remainder of the evaluation will be completed by another provider, this initial triage assessment does not replace that evaluation, and the importance of remaining in the ED until their evaluation is complete.     Suellen Sherran LABOR, [place] 07/20/23 [id number]

## 2023-07-20 NOTE — ED Triage Notes (Signed)
 Pt reports he has had a small pea sized bump under his chin for about 20 years that he just always thought was a hair bump.  Pt reports it started to get bigger 2 days ago and now his pilonidal cyst came back today as well.

## 2023-07-21 ENCOUNTER — Ambulatory Visit: Payer: BLUE CROSS/BLUE SHIELD | Attending: Pain Medicine | Admitting: Pain Medicine

## 2023-07-21 ENCOUNTER — Encounter: Payer: Self-pay | Admitting: Pain Medicine

## 2023-07-21 VITALS — BP 131/83 | HR 78 | Temp 98.6°F | Resp 16 | Ht 71.0 in | Wt 297.0 lb

## 2023-07-21 DIAGNOSIS — G894 Chronic pain syndrome: Secondary | ICD-10-CM | POA: Diagnosis present

## 2023-07-21 DIAGNOSIS — Z79891 Long term (current) use of opiate analgesic: Secondary | ICD-10-CM | POA: Diagnosis present

## 2023-07-21 DIAGNOSIS — L0201 Cutaneous abscess of face: Secondary | ICD-10-CM | POA: Diagnosis present

## 2023-07-21 DIAGNOSIS — M25552 Pain in left hip: Secondary | ICD-10-CM | POA: Insufficient documentation

## 2023-07-21 DIAGNOSIS — G8929 Other chronic pain: Secondary | ICD-10-CM | POA: Diagnosis present

## 2023-07-21 DIAGNOSIS — L0231 Cutaneous abscess of buttock: Secondary | ICD-10-CM | POA: Insufficient documentation

## 2023-07-21 DIAGNOSIS — M25562 Pain in left knee: Secondary | ICD-10-CM | POA: Diagnosis present

## 2023-07-21 DIAGNOSIS — L0211 Cutaneous abscess of neck: Secondary | ICD-10-CM | POA: Diagnosis not present

## 2023-07-21 DIAGNOSIS — L03317 Cellulitis of buttock: Secondary | ICD-10-CM | POA: Diagnosis present

## 2023-07-21 DIAGNOSIS — M79605 Pain in left leg: Secondary | ICD-10-CM | POA: Insufficient documentation

## 2023-07-21 DIAGNOSIS — Z79899 Other long term (current) drug therapy: Secondary | ICD-10-CM | POA: Diagnosis present

## 2023-07-21 DIAGNOSIS — M545 Low back pain, unspecified: Secondary | ICD-10-CM | POA: Insufficient documentation

## 2023-07-21 DIAGNOSIS — M961 Postlaminectomy syndrome, not elsewhere classified: Secondary | ICD-10-CM | POA: Insufficient documentation

## 2023-07-21 DIAGNOSIS — M25551 Pain in right hip: Secondary | ICD-10-CM | POA: Insufficient documentation

## 2023-07-21 DIAGNOSIS — M79604 Pain in right leg: Secondary | ICD-10-CM | POA: Diagnosis present

## 2023-07-21 DIAGNOSIS — M272 Inflammatory conditions of jaws: Secondary | ICD-10-CM | POA: Insufficient documentation

## 2023-07-21 MED ORDER — OXYCODONE HCL 10 MG PO TABS: 10.0000 mg | ORAL_TABLET | Freq: Four times a day (QID) | ORAL | 0 refills | Status: DC | PRN

## 2023-07-21 MED ORDER — FENTANYL CITRATE (PF) 100 MCG/2ML IJ SOLN
100.0000 ug | Freq: Once | INTRAMUSCULAR | Status: AC
  Administered 2023-07-21: 100 ug via INTRAVENOUS
  Filled 2023-07-21: qty 2

## 2023-07-21 MED ORDER — KETOROLAC TROMETHAMINE 15 MG/ML IJ SOLN
15.0000 mg | Freq: Once | INTRAMUSCULAR | Status: AC
  Administered 2023-07-21: 15 mg via INTRAVENOUS
  Filled 2023-07-21: qty 1

## 2023-07-21 MED ORDER — IOHEXOL 300 MG/ML  SOLN
75.0000 mL | Freq: Once | INTRAMUSCULAR | Status: AC | PRN
Start: 2023-07-21 — End: 2023-07-21
  Administered 2023-07-21: 75 mL via INTRAVENOUS

## 2023-07-21 MED ORDER — LIDOCAINE-EPINEPHRINE (PF) 2 %-1:200000 IJ SOLN
20.0000 mL | Freq: Once | INTRAMUSCULAR | Status: AC
  Administered 2023-07-21: 20 mL
  Filled 2023-07-21: qty 20

## 2023-07-21 MED ORDER — POVIDONE-IODINE 10 % EX SOLN
CUTANEOUS | Status: AC
  Administered 2023-07-21: 1
  Filled 2023-07-21: qty 14.8

## 2023-07-21 NOTE — Patient Instructions (Signed)
 ______________________________________________________________________    Opioid Pain Medication Update  To: All patients taking opioid pain medications. (I.e.: hydrocodone , hydromorphone , oxycodone , oxymorphone, morphine, codeine, methadone , tapentadol , tramadol , buprenorphine , fentanyl , etc.)  Re: Updated review of side effects and adverse reactions of opioid analgesics, as well as new information about long term effects of this class of medications.  Direct risks of long-term opioid therapy are not limited to opioid addiction and overdose. Potential medical risks include serious fractures, breathing problems during sleep, hyperalgesia, immunosuppression, chronic constipation, bowel obstruction, myocardial infarction, and tooth decay secondary to xerostomia.  Unpredictable adverse effects that can occur even if you take your medication correctly: Cognitive impairment, respiratory depression, and death. Most people think that if they take their medication correctly, and as instructed, that they will be safe. Nothing could be farther from the truth. In reality, a significant amount of recorded deaths associated with the use of opioids has occurred in individuals that had taken the medication for a long time, and were taking their medication correctly. The following are examples of how this can happen: Patient taking his/her medication for a long time, as instructed, without any side effects, is given a certain antibiotic or another unrelated medication, which in turn triggers a Drug-to-drug interaction leading to disorientation, cognitive impairment, impaired reflexes, respiratory depression or an untoward event leading to serious bodily harm or injury, including death.  Patient taking his/her medication for a long time, as instructed, without any side effects, develops an acute impairment of liver and/or kidney function. This will lead to a rapid inability of the body to breakdown and eliminate  their pain medication, which will result in effects similar to an overdose, but with the same medicine and dose that they had always taken. This again may lead to disorientation, cognitive impairment, impaired reflexes, respiratory depression or an untoward event leading to serious bodily harm or injury, including death.  A similar problem will occur with patients as they grow older and their liver and kidney function begins to decrease as part of the aging process.  Background information: Historically, the original case for using long-term opioid therapy to treat chronic noncancer pain was based on safety assumptions that subsequent experience has called into question. In 1996, the American Pain Society and the American Academy of Pain Medicine issued a consensus statement supporting long-term opioid therapy. This statement acknowledged the dangers of opioid prescribing but concluded that the risk for addiction was low; respiratory depression induced by opioids was short-lived, occurred mainly in opioid-naive patients, and was antagonized by pain; tolerance was not a common problem; and efforts to control diversion should not constrain opioid prescribing. This has now proven to be wrong. Experience regarding the risks for opioid addiction, misuse, and overdose in community practice has failed to support these assumptions.  According to the Centers for Disease Control and Prevention, fatal overdoses involving opioid analgesics have increased sharply over the past decade. Currently, more than 96,700 people die from drug overdoses every year. Opioids are a factor in 7 out of every 10 overdose deaths. Deaths from drug overdose have surpassed motor vehicle accidents as the leading cause of death for individuals between the ages of 60 and 72.  Clinical data suggest that neuroendocrine dysfunction may be very common in both men and women, potentially causing hypogonadism, erectile dysfunction, infertility,  decreased libido, osteoporosis, and depression. Recent studies linked higher opioid dose to increased opioid-related mortality. Controlled observational studies reported that long-term opioid therapy may be associated with increased risk for cardiovascular events. Subsequent  meta-analysis concluded that the safety of long-term opioid therapy in elderly patients has not been proven.   Side Effects and adverse reactions: Common side effects: Drowsiness (sedation). Dizziness. Nausea and vomiting. Constipation. Physical dependence -- Dependence often manifests with withdrawal symptoms when opioids are discontinued or decreased. Tolerance -- As you take repeated doses of opioids, you require increased medication to experience the same effect of pain relief. Respiratory depression -- This can occur in healthy people, especially with higher doses. However, people with COPD, asthma or other lung conditions may be even more susceptible to fatal respiratory impairment.  Uncommon side effects: An increased sensitivity to feeling pain and extreme response to pain (hyperalgesia). Chronic use of opioids can lead to this. Delayed gastric emptying (the process by which the contents of your stomach are moved into your small intestine). Muscle rigidity. Immune system and hormonal dysfunction. Quick, involuntary muscle jerks (myoclonus). Arrhythmia. Itchy skin (pruritus). Dry mouth (xerostomia).  Long-term side effects: Chronic constipation. Sleep-disordered breathing (SDB). Increased risk of bone fractures. Hypothalamic-pituitary-adrenal dysregulation. Increased risk of overdose.  RISKS: Respiratory depression and death: Opioids increase the risk of respiratory depression and death.  Drug-to-drug interactions: Opioids are relatively contraindicated in combination with benzodiazepines, sleep inducers, and other central nervous system depressants. Other classes of medications (i.e.: certain antibiotics  and even over-the-counter medications) may also trigger or induce respiratory depression in some patients.  Medical conditions: Patients with pre-existing respiratory problems are at higher risk of respiratory failure and/or depression when in combination with opioid analgesics. Opioids are relatively contraindicated in some medical conditions such as central sleep apnea.   Fractures and Falls:  Opioids increase the risk and incidence of falls. This is of particular importance in elderly patients.  Endocrine System:  Long-term administration is associated with endocrine abnormalities (endocrinopathies). (Also known as Opioid-induced Endocrinopathy) Influences on both the hypothalamic-pituitary-adrenal axis?and the hypothalamic-pituitary-gonadal axis have been demonstrated with consequent hypogonadism and adrenal insufficiency in both sexes. Hypogonadism and decreased levels of dehydroepiandrosterone sulfate have been reported in men and women. Endocrine effects include: Amenorrhoea in women (abnormal absence of menstruation) Reduced libido in both sexes Decreased sexual function Erectile dysfunction in men Hypogonadisms (decreased testicular function with shrinkage of testicles) Infertility Depression and fatigue Loss of muscle mass Anxiety Depression Immune suppression Hyperalgesia Weight gain Anemia Osteoporosis Patients (particularly women of childbearing age) should avoid opioids. There is insufficient evidence to recommend routine monitoring of asymptomatic patients taking opioids in the long-term for hormonal deficiencies.  Immune System: Human studies have demonstrated that opioids have an immunomodulating effect. These effects are mediated via opioid receptors both on immune effector cells and in the central nervous system. Opioids have been demonstrated to have adverse effects on antimicrobial response and anti-tumour surveillance. Buprenorphine  has been demonstrated to have  no impact on immune function.  Opioid Induced Hyperalgesia: Human studies have demonstrated that prolonged use of opioids can lead to a state of abnormal pain sensitivity, sometimes called opioid induced hyperalgesia (OIH). Opioid induced hyperalgesia is not usually seen in the absence of tolerance to opioid analgesia. Clinically, hyperalgesia may be diagnosed if the patient on long-term opioid therapy presents with increased pain. This might be qualitatively and anatomically distinct from pain related to disease progression or to breakthrough pain resulting from development of opioid tolerance. Pain associated with hyperalgesia tends to be more diffuse than the pre-existing pain and less defined in quality. Management of opioid induced hyperalgesia requires opioid dose reduction.  Cancer: Chronic opioid therapy has been associated with an increased risk  of cancer among noncancer patients with chronic pain. This association was more evident in chronic strong opioid users. Chronic opioid consumption causes significant pathological changes in the small intestine and colon. Epidemiological studies have found that there is a link between opium dependence and initiation of gastrointestinal cancers. Cancer is the second leading cause of death after cardiovascular disease. Chronic use of opioids can cause multiple conditions such as GERD, immunosuppression and renal damage as well as carcinogenic effects, which are associated with the incidence of cancers.   Mortality: Long-term opioid use has been associated with increased mortality among patients with chronic non-cancer pain (CNCP).  Prescription of long-acting opioids for chronic noncancer pain was associated with a significantly increased risk of all-cause mortality, including deaths from causes other than overdose.  Reference: Von Korff M, Kolodny A, Deyo RA, Chou R. Long-term opioid therapy reconsidered. Ann Intern Med. 2011 Sep 6;155(5):325-8. doi:  10.7326/[id number]. PMID: 78106373; PMCID: [id number]. Kit JINNY Laurence CINDERELLA Pearley JINNY, Hayward RA, Dunn KM, Jordan KP. Risk of adverse events in patients prescribed long-term opioids: A cohort study in the [place] Clinical Practice Research [employer]. Eur J Pain. 2019 May;23(5):908-922. doi: 10.1002/ejp.1357. Epub 2019 Jan 31. PMID: 69379883. Colameco S, Coren JS, Ciervo CA. Continuous opioid treatment for chronic noncancer pain: a time for moderation in prescribing. Postgrad Med. 2009 Jul;121(4):61-6. doi: 10.3810/pgm.2009.07.2032. PMID: 80358728. Gigi JONELLE Shlomo MILUS Levern IVER Conny RN, [place] SD, Blazina I, Lonell DASEN, Bougatsos C, Deyo RA. The effectiveness and risks of long-term opioid therapy for chronic pain: a systematic review for a [employer] of [employer] Pathways to [employer]. Ann Intern Med. 2015 Feb 17;162(4):276-86. doi: 10.7326/M14-2559. PMID: 74418742. Rory CHRISTELLA Laurence [facility], [place] DM. NCHS Data Brief No. 22. [place]: Centers for Disease Control and Prevention; 2009. Sep, Increase in Fatal Poisonings Involving Opioid Analgesics in the [place] , 1999-2006. Song IA, Choi HR, Oh TK. Long-term opioid use and mortality in patients with chronic non-cancer pain: Ten-year follow-up study in [place] from 2010 through 2019. EClinicalMedicine. 2022 Jul 18;51:101558. doi: 10.1016/j.eclinm.2022.898441. PMID: 64124182; PMCID: EFR0695089. Huser, W., Schubert, T., Vogelmann, T. et al. All-cause mortality in patients with long-term opioid therapy compared with non-opioid analgesics for chronic non-cancer pain: a database study. BMC Med 18, 162 (2020). http://lester.info/ Rashidian H, Zendehdel K, Kamangar F, Malekzadeh R, Haghdoost AA. An Ecological Study of the Association between Opiate Use and Incidence of Cancers. Addict Health. 2016 Fall;8(4):252-260. PMID: 71180443; PMCID: EFR4445194.  Our Goal: Our goal is to control your pain with means other  than the use of opioid pain medications.  Our Recommendation: Talk to your physician about coming off of these medications. We can assist you with the tapering down and stopping these medicines. Based on the new information, even if you cannot completely stop the medication, a decrease in the dose may be associated with a lesser risk. Ask for other means of controlling the pain. Decrease or eliminate those factors that significantly contribute to your pain such as smoking, obesity, and a diet heavily tilted towards inflammatory nutrients.  Last Updated: 01/24/2023   ______________________________________________________________________       ______________________________________________________________________    National Pain Medication Shortage  The [place] is experiencing worsening drug shortages. These have had a negative widespread effect on patient care and treatment. Not expected to improve any time soon. Predicted to last past 2029.   Drug shortage list (generic names) Oxycodone  IR Oxycodone /APAP Oxymorphone IR Hydromorphone  Hydrocodone /APAP Morphine  Where is the problem?  Manufacturing and supply level.  Will this shortage  affect you?  Only if you take any of the above pain medications.  How? You may be unable to fill your prescription.  Your pharmacist may offer a partial fill of your prescription. (Warning: Do not accept partial fills.) Prescriptions partially filled cannot be transferred to another pharmacy. Read our Medication Rules and Regulation. Depending on how much medicine you are dependent on, you may experience withdrawals when unable to get the medication.  Recommendations: Consider ending your dependence on opioid pain medications. Ask your pain specialist to assist you with the process. Consider switching to a medication currently not in shortage, such as Buprenorphine . Talk to your pain specialist about this option. Consider decreasing your pain  medication requirements by managing tolerance thru Drug Holidays. This may help minimize withdrawals, should you run out of medicine. Control your pain thru the use of non-pharmacological interventional therapies.   Your prescriber: Prescribers cannot be blamed for shortages. Medication manufacturing and supply issues cannot be fixed by the prescriber.   NOTE: The prescriber is not responsible for supplying the medication, or solving supply issues. Work with your pharmacist to solve it. The patient is responsible for the decision to take or continue taking the medication and for identifying and securing a legal supply source. By law, supplying the medication is the job and responsibility of the pharmacy. The prescriber is responsible for the evaluation, monitoring, and prescribing of these medications.   Prescribers will NOT: Re-issue prescriptions that have been partially filled. Re-issue prescriptions already sent to a pharmacy.  Re-send prescriptions to a different pharmacy because yours did not have your medication. Ask pharmacist to order more medicine or transfer the prescription to another pharmacy. (Read below.)  New 2023 regulation: March 19, 2022 Revised Regulation Allows DEA-Registered Pharmacies to Transfer Electronic Prescriptions at a Patient's Request DEA Headquarters Division - Public Information Office Patients now have the ability to request their electronic prescription be transferred to another pharmacy without having to go back to their practitioner to initiate the request. This revised regulation went into effect on Monday, March 15, 2022.     At a patient's request, a DEA-registered retail pharmacy can now transfer an electronic prescription for a controlled substance (schedules II-V) to another DEA-registered retail pharmacy. Prior to this change, patients would have to go through their practitioner to cancel their prescription and have it re-issued to a different  pharmacy. The process was taxing and time consuming for both patients and practitioners.    The Drug Enforcement Administration [facility]) published its intent to revise the process for transferring electronic prescriptions on June 06, 2020.  The final rule was published in the federal register on February 11, 2022 and went into effect 30 days later.  Under the final rule, a prescription can only be transferred once between pharmacies, and only if allowed under existing state or other applicable law. The prescription must remain in its electronic form; may not be altered in any way; and the transfer must be communicated directly between two licensed pharmacists. It's important to note, any authorized refills transfer with the original prescription, which means the entire prescription will be filled at the same pharmacy.  Reference: [email or web address].is [facility] website announcement)  [email or web address] [employer])   [employer] / Vol. 88, No. 143 / Thursday, February 11, 2022 / Rules and Regulations DEPARTMENT OF JUSTICE  Drug Enforcement Administration  21 CFR Part 1306  [Docket No. [id number]]  RIN [id number] Transfer of Electronic Prescriptions for Schedules II-V Controlled Substances Between  Pharmacies for Initial Filling  ______________________________________________________________________       ______________________________________________________________________    Transfer of Pain Medication between Pharmacies  Re: 2023 DEA Clarification on existing regulation  Published on DEA Website: March 19, 2022  Title: Revised Regulation Allows DEA-Registered Pharmacies to Electrical Engineer Prescriptions at a Patient's Request DEA Headquarters Division - [employer]  Patients now have the ability to  request their electronic prescription be transferred to another pharmacy without having to go back to their practitioner to initiate the request. This revised regulation went into effect on Monday, March 15, 2022.     At a patient's request, a DEA-registered retail pharmacy can now transfer an electronic prescription for a controlled substance (schedules II-V) to another DEA-registered retail pharmacy. Prior to this change, patients would have to go through their practitioner to cancel their prescription and have it re-issued to a different pharmacy. The process was taxing and time consuming for both patients and practitioners.    The Drug Enforcement Administration [facility]) published its intent to revise the process for transferring electronic prescriptions on June 06, 2020.  The final rule was published in the federal register on February 11, 2022 and went into effect 30 days later.  Under the final rule, a prescription can only be transferred once between pharmacies, and only if allowed under existing state or other applicable law. The prescription must remain in its electronic form; may not be altered in any way; and the transfer must be communicated directly between two licensed pharmacists. It's important to note, any authorized refills transfer with the original prescription, which means the entire prescription will be filled at the same pharmacy.    REFERENCES: 1. DEA website announcement hugehand.is  2. Department of Justice website  Cheapwipes.[email or web address]  3. DEPARTMENT OF JUSTICE Drug Enforcement Administration 21 CFR Part 1306 [Docket No. [id number]] RIN [id number] Transfer of Electronic Prescriptions for Schedules II-V Controlled Substances Between Pharmacies for Initial  Filling  ______________________________________________________________________       ______________________________________________________________________    Medication Rules  Purpose: To inform patients, and their family members, of our medication rules and regulations.  Applies to: All patients receiving prescriptions from our practice (written or electronic).  Pharmacy of record: This is the pharmacy where your electronic prescriptions will be sent. Make sure we have the correct one.  Electronic prescriptions: In compliance with the [place]  Strengthen Opioid Misuse Prevention (STOP) Act of 2017 (Session Law 2017-74/H243), effective July 19, 2018, all controlled substances must be electronically prescribed. Written prescriptions, faxing, or calling prescriptions to a pharmacy will no longer be done.  Prescription refills: These will be provided only during in-person appointments. No medications will be renewed without a face-to-face evaluation with your provider. Applies to all prescriptions.  NOTE: The following applies primarily to controlled substances (Opioid* Pain Medications).   Type of encounter (visit): For patients receiving controlled substances, face-to-face visits are required. (Not an option and not up to the patient.)  Patient's Responsibilities: Pain Pills: Bring all pain pills to every appointment (except for procedure appointments). Pill counts are required.  Pill Bottles: Bring pills in original pharmacy bottle. Bring bottle, even if empty. Always bring the bottle of the most recent fill.  Medication refills: You are responsible for knowing and keeping track of what medications you are taking and when is it that you will need a refill. The day before your appointment: write a list of all prescriptions that need to be refilled. The day of the appointment: give the list to  the admitting nurse. Prescriptions will be written only during appointments. No  prescriptions will be written on procedure days. If you forget a medication: it will not be Called in, Faxed, or electronically sent. You will need to get another appointment to get these prescribed. No early refills. Do not call asking to have your prescription filled early. Partial  or short prescriptions: Occasionally your pharmacy may not have enough pills to fill your prescription.  NEVER ACCEPT a partial fill or a prescription that is short of the total amount of pills that you were prescribed.  With controlled substances the law allows 72 hours for the pharmacy to complete the prescription.  If the prescription is not completed within 72 hours, the pharmacist will require a new prescription to be written. This means that you will be short on your medicine and we WILL NOT send another prescription to complete your original prescription.  Instead, request the pharmacy to send a carrier to a nearby branch to get enough medication to provide you with your full prescription. Prescription Accuracy: You are responsible for carefully inspecting your prescriptions before leaving our office. Have the discharge nurse carefully go over each prescription with you, before taking them home. Make sure that your name is accurately spelled, that your address is correct. Check the name and dose of your medication to make sure it is accurate. Check the number of pills, and the written instructions to make sure they are clear and accurate. Make sure that you are given enough medication to last until your next medication refill appointment. Taking Medication: Take medication as prescribed. When it comes to controlled substances, taking less pills or less frequently than prescribed is permitted and encouraged. Never take more pills than instructed. Never take the medication more frequently than prescribed.  Inform other Doctors: Always inform, all of your healthcare providers, of all the medications you take. Pain  Medication from other Providers: You are not allowed to accept any additional pain medication from any other Doctor or Healthcare provider. There are two exceptions to this rule. (see below) In the event that you require additional pain medication, you are responsible for notifying us , as stated below. Cough Medicine: Often these contain an opioid, such as codeine or hydrocodone . Never accept or take cough medicine containing these opioids if you are already taking an opioid* medication. The combination may cause respiratory failure and death. Medication Agreement: You are responsible for carefully reading and following our Medication Agreement. This must be signed before receiving any prescriptions from our practice. Safely store a copy of your signed Agreement. Violations to the Agreement will result in no further prescriptions. (Additional copies of our Medication Agreement are available upon request.) Laws, Rules, & Regulations: All patients are expected to follow all [address] and [employer], Rules, [employer]. Ignorance of the Laws does not constitute a valid excuse.  Illegal drugs and Controlled Substances: The use of illegal substances (including, but not limited to marijuana and its derivatives) and/or the illegal use of any controlled substances is strictly prohibited. Violation of this rule may result in the immediate and permanent discontinuation of any and all prescriptions being written by our practice. The use of any illegal substances is prohibited. Adopted CDC guidelines & recommendations: Target dosing levels will be at or below 60 MME/day. Use of benzodiazepines** is not recommended. Urine Drug testing: Patients taking controlled substances will be required to provide a urine sample upon request. Do not void before coming to your medication management appointments.  Hold emptying your bladder until you are admitted. The admitting nurse will inform you if a sample is required. Our  practice reserves the right to call you at any time to provide a sample. Once receiving the call, you have 24 hours to comply with request. Not providing a sample upon request may result in termination of medication therapy.  Exceptions: There are only two exceptions to the rule of not receiving pain medications from other Healthcare Providers. Exception #1 (Emergencies): In the event of an emergency (i.e.: accident requiring emergency care), you are allowed to receive additional pain medication. However, you are responsible for: As soon as you are able, call our office [phone], at any time of the day or night, and leave a message stating your name, the date and nature of the emergency, and the name and dose of the medication prescribed. In the event that your call is answered by a member of our staff, make sure to document and save the date, time, and the name of the person that took your information.  Exception #2 (Planned Surgery): In the event that you are scheduled by another doctor or dentist to have any type of surgery or procedure, you are allowed (for a period no longer than 30 days), to receive additional pain medication, for the acute post-op pain. However, in this case, you are responsible for picking up a copy of our Post-op Pain Management for Surgeons handout, and giving it to your surgeon or dentist. This document is available at our office, and does not require an appointment to obtain it. Simply go to our office during business hours (Monday-Thursday from 8:00 AM to 4:00 PM) (Friday 8:00 AM to 12:00 Noon) or if you have a scheduled appointment with us , prior to your surgery, and ask for it by name. In addition, you are responsible for: calling our office [phone], at any time of the day or night, and leaving a message stating your name, name of your surgeon, type of surgery, and date of procedure or surgery. Failure to comply with your responsibilities may result in termination  of therapy involving the controlled substances.  Consequences:  Non-compliance with the above rules may result in permanent discontinuation of medication prescription therapy. All patients receiving any type of controlled substance is expected to comply with the above patient responsibilities. Not doing so may result in permanent discontinuation of medication prescription therapy. Medication Agreement Violation. Following the above rules, including your responsibilities will help you in avoiding a Medication Agreement Violation ("Breaking your Pain Medication Contract").  *Opioid medications include: morphine, codeine, oxycodone , oxymorphone, hydrocodone , hydromorphone , meperidine, tramadol , tapentadol , buprenorphine , fentanyl , methadone . **Benzodiazepine medications include: diazepam (Valium), alprazolam (Xanax), clonazepam (Klonopine), lorazepam (Ativan), clorazepate (Tranxene), chlordiazepoxide (Librium), estazolam (Prosom), oxazepam (Serax), temazepam (Restoril), triazolam (Halcion) (Last updated: 05/11/2023) ______________________________________________________________________      ______________________________________________________________________    Medication Recommendations and Reminders  Applies to: All patients receiving prescriptions (written and/or electronic).  Medication Rules & Regulations: You are responsible for reading, knowing, and following our Medication Rules document. These exist for your safety and that of others. They are not flexible and neither are we. Dismissing or ignoring them is an act of non-compliance that may result in complete and irreversible termination of such medication therapy. For safety reasons, non-compliance will not be tolerated. As with the [place] fundamental legal principle of ignorance of the law is no defense, we will accept no excuses for not having read and knowing the content of documents provided to you by  our practice.  Pharmacy of  record:  Definition: This is the pharmacy where your electronic prescriptions will be sent.  We do not endorse any particular pharmacy. It is up to you and your insurance to decide what pharmacy to use.  We do not restrict you in your choice of pharmacy. However, once we write for your prescriptions, we will NOT be re-sending more prescriptions to fix restricted supply problems created by your pharmacy, or your insurance.  The pharmacy listed in the electronic medical record should be the one where you want electronic prescriptions to be sent. If you choose to change pharmacy, simply notify our nursing staff. Changes will be made only during your regular appointments and not over the phone.  Recommendations: Keep all of your pain medications in a safe place, under lock and key, even if you live alone. We will NOT replace lost, stolen, or damaged medication. We do not accept Police Reports as proof of medications having been stolen. After you fill your prescription, take 1 week's worth of pills and put them away in a safe place. You should keep a separate, properly labeled bottle for this purpose. The remainder should be kept in the original bottle. Use this as your primary supply, until it runs out. Once it's gone, then you know that you have 1 week's worth of medicine, and it is time to come in for a prescription refill. If you do this correctly, it is unlikely that you will ever run out of medicine. To make sure that the above recommendation works, it is very important that you make sure your medication refill appointments are scheduled at least 1 week before you run out of medicine. To do this in an effective manner, make sure that you do not leave the office without scheduling your next medication management appointment. Always ask the nursing staff to show you in your prescription , when your medication will be running out. Then arrange for the receptionist to get you a return appointment, at least  7 days before you run out of medicine. Do not wait until you have 1 or 2 pills left, to come in. This is very poor planning and does not take into consideration that we may need to cancel appointments due to bad weather, sickness, or emergencies affecting our staff. DO NOT ACCEPT A Partial Fill: If for any reason your pharmacy does not have enough pills/tablets to completely fill or refill your prescription, do not allow for a partial fill. The law allows the pharmacy to complete that prescription within 72 hours, without requiring a new prescription. If they do not fill the rest of your prescription within those 72 hours, you will need a separate prescription to fill the remaining amount, which we will NOT provide. If the reason for the partial fill is your insurance, you will need to talk to the pharmacist about payment alternatives for the remaining tablets, but again, DO NOT ACCEPT A PARTIAL FILL, unless you can trust your pharmacist to obtain the remainder of the pills within 72 hours.  Prescription refills and/or changes in medication(s):  Prescription refills, and/or changes in dose or medication, will be conducted only during scheduled medication management appointments. (Applies to both, written and electronic prescriptions.) No refills on procedure days. No medication will be changed or started on procedure days. No changes, adjustments, and/or refills will be conducted on a procedure day. Doing so will interfere with the diagnostic portion of the procedure. No phone refills. No medications will be  called into the pharmacy. No Fax refills. No weekend refills. No Holliday refills. No after hours refills.  Remember:  Business hours are:  Monday to Thursday 8:00 AM to 4:00 PM Provider's Schedule: Eric Como, MD - Appointments are:  Medication management: Monday and Wednesday 8:00 AM to 4:00 PM Procedure day: Tuesday and Thursday 7:30 AM to 4:00 PM Wallie Sherry, MD -  Appointments are:  Medication management: Tuesday and Thursday 8:00 AM to 4:00 PM Procedure day: Monday and Wednesday 7:30 AM to 4:00 PM (Last update: 05/11/2022) ______________________________________________________________________      ______________________________________________________________________     Naloxone  Nasal Spray  Why am I receiving this medication? [place]  STOP ACT requires that all patients taking high dose opioids or at risk of opioids respiratory depression, be prescribed an opioid reversal agent, such as Naloxone  (AKA: Narcan ).  What is this medication? NALOXONE  (nal OX one) treats opioid overdose, which causes slow or shallow breathing, severe drowsiness, or trouble staying awake. Call emergency services after using this medication. You may need additional treatment. Naloxone  works by reversing the effects of opioids. It belongs to a group of medications called opioid blockers.  COMMON BRAND NAME(S): Kloxxado , Narcan   What should I tell my care team before I take this medication? They need to know if you have any of these conditions: Heart disease Substance use disorder An unusual or allergic reaction to naloxone , other medications, foods, dyes, or preservatives Pregnant or trying to get pregnant Breast-feeding  When to use this medication? This medication is to be used for the treatment of respiratory depression (less than 8 breaths per minute) secondary to opioid overdose.   How to use this medication? This medication is for use in the nose. Lay the person on their back. Support their neck with your hand and allow the head to tilt back before giving the medication. The nasal spray should be given into 1 nostril. After giving the medication, move the person onto their side. Do not remove or test the nasal spray until ready to use. Get emergency medical help right away after giving the first dose of this medication, even if the person wakes up. You  should be familiar with how to recognize the signs and symptoms of a narcotic overdose. If more doses are needed, give the additional dose in the other nostril. Talk to your care team about the use of this medication in children. While this medication may be prescribed for children as young as newborns for selected conditions, precautions do apply.  Naloxone  Overdosage: If you think you have taken too much of this medicine contact a poison control center or emergency room at once.  NOTE: This medicine is only for you. Do not share this medicine with others.  What if I miss a dose? This does not apply.  What may interact with this medication? This is only used during an emergency. No interactions are expected during emergency use. This list may not describe all possible interactions. Give your health care provider a list of all the medicines, herbs, non-prescription drugs, or dietary supplements you use. Also tell them if you smoke, drink alcohol, or use illegal drugs. Some items may interact with your medicine.  What should I watch for while using this medication? Keep this medication ready for use in the case of an opioid overdose. Make sure that you have the phone number of your care team and local hospital ready. You may need to have additional doses of this medication. Each nasal spray contains  a single dose. Some emergencies may require additional doses. After use, bring the treated person to the nearest hospital or call 911. Make sure the treating care team knows that the person has received a dose of this medication. You will receive additional instructions on what to do during and after use of this medication before an emergency occurs.  What side effects may I notice from receiving this medication? Side effects that you should report to your care team as soon as possible: Allergic reactions--skin rash, itching, hives, swelling of the face, lips, tongue, or throat Side effects that  usually do not require medical attention (report these to your care team if they continue or are bothersome): Constipation Dryness or irritation inside the nose Headache Increase in blood pressure Muscle spasms Stuffy nose Toothache This list may not describe all possible side effects. Call your doctor for medical advice about side effects. You may report side effects to FDA at 1-800-FDA-1088.  Where should I keep my medication? Because this is an emergency medication, you should keep it with you at all times.  Keep out of the reach of children and pets. Store between 20 and 25 degrees C (68 and 77 degrees F). Do not freeze. Throw away any unused medication after the expiration date. Keep in original box until ready to use.  NOTE: This sheet is a summary. It may not cover all possible information. If you have questions about this medicine, talk to your doctor, pharmacist, or health care provider.   2023 Elsevier/Gold Standard (2021-03-13 00:00:00)  ______________________________________________________________________      ______________________________________________________________________    Patient information on: Body mass index (BMI) and Weight Management  Dear Mr. Amsden you are receiving this information because your weight may be adversely affecting your health.   Your current Estimated body mass index is 41.1 kg/m as calculated from the following:   Height as of 07/20/23: 5' 11 (1.803 m).   Weight as of 07/20/23: 294 lb 11.2 oz (133.7 kg).  We recommend you talk to your primary care physician about providing or referring you to a supervised weight management program.  Here is some information about weight and the body mass index (BMI) classification:  BMI is a measure of obesity that's calculated by dividing a person's weight in kilograms by their height in meters squared. A person can use an online calculator to determine their BMI. Body mass index (BMI) is a  common tool for deciding whether a person has an appropriate body weight.  It measures a person's weight in relation to their height.  According to the [facility] of health (NIH): A BMI of less than 18.5 means that a person is underweight. A BMI of between 18.5 and 24.9 is ideal. A BMI of between 25 and 29.9 is overweight. A BMI over 30 indicates obesity.  Body Mass Index (BMI) Classification BMI level (kg/m2) Category Associated incidence of chronic pain  <18  Underweight   18.5-24.9 Ideal body weight   25-29.9 Overweight  20%  30-34.9 Obese (Class I)  68%  35-39.9 Severe obesity (Class II)  136%  >40 Extreme obesity (Class III)  254%    Morbidly Obese Classification: You will be considered to be Morbidly Obese if your BMI is above 30 and you have one or more of the following conditions caused or associated to obesity: 1.    Type 2 Diabetes (Leading to cardiovascular diseases (CVD), stroke, peripheral vascular diseases (PVD), retinopathy, nephropathy, and neuropathy) 2.    Cardiovascular Disease (  High Blood Pressure; Congestive Heart Failure; High Cholesterol; Coronary Artery Disease; Angina; Arrhythmias, Dysrhythmias, or Heart Attacks) 3.    Breathing problems (Asthma; obesity-hypoventilation syndrome; obstructive sleep apnea; chronic inflammatory airway disease; reactive airway disease; or shortness of breath) 4.    Chronic kidney disease 5.    Liver disease (nonalcoholic fatty liver disease) 6.    High blood pressure 7.    Acid reflux (gastroesophageal reflux disease; heartburn) 8.    Osteoarthritis (OA) (affecting the hip(s), the knee(s) and/or the lower back) (usually requiring knee and/or hip replacements, as well as back surgeries) 9.    Low back pain (Lumbar Facet Syndrome; and/or Degenerative Disc Disease) 10.  Hip pain (Osteoarthritis of hip) (For every 1 lbs of added body weight, there is a 2 lbs increase in pressure inside of each hip articulation. 1:2 mechanical  relationship) 11.  Knee pain (Osteoarthritis of knee) (For every 1 lbs of added body weight, there is a 4 lbs increase in pressure inside of each knee articulation. 1:4 mechanical relationship) (patients with a BMI>30 kg/m2 were 6.8 times more likely to develop knee OA than normal-weight individuals) 12.  Cancer: Epidemiological studies have shown that obesity is a risk factor for: post-menopausal breast cancer; cancers of the endometrium, colon and kidney cancer; malignant adenomas of the esophagus. Obese subjects have an approximately 1.5-3.5-fold increased risk of developing these cancers compared with normal-weight subjects, and it has been estimated that between 15 and 45% of these cancers can be attributed to overweight. More recent studies suggest that obesity may also increase the risk of other types of cancer, including pancreatic, hepatic and gallbladder cancer. (Ref: Obesity and cancer. Pischon T, Nthlings U, [employer] H. Proc Nutr Soc. 2008 May;67(2):128-45. doi: 10.1017/S0029665108006976.) The International Agency for Research on Cancer (IARC) has identified 13 cancers associated with overweight and obesity: meningioma, multiple myeloma, adenocarcinoma of the esophagus, and cancers of the thyroid, postmenopausal breast cancer, gallbladder, stomach, liver, pancreas, kidney, ovaries, uterus, colon and rectal (colorectal) cancers. 55 percent of all cancers diagnosed in women and 24 percent of those diagnosed in men are associated with overweight and obesity.  Recommendation: If you have any of the above conditions it is urgent that you take a step back and concentrate in losing weight. Dedicate 100% of your efforts on this task. Nothing else will improve your health more than bringing your weight down and your BMI to less than 30.   Nutritionist and/or supervised weight-management program: We are aware that most chronic pain patients are unable to exercise secondary to their pain. For this reason, you  must rely on proper nutrition and diet in order to lose the weight. We recommend you talk to a nutritionist.   Bariatric surgery: A person might be considered a candidate for bariatric surgery if they meet one of the following BMI criteria:  BMI of 40 or higher: This is considered extreme obesity (Class III). BMI of 35-39.9: This is considered obesity, and the person might also have a serious weight-related health condition, such as high blood pressure, type 2 diabetes, or severe sleep apnea  BMI of 30-34.9: This might be considered if the person has serious weight-related health problems and hasn't had substantial weight loss or improvement in co-morbidities through other methods   On your own: A realistic goal is to lose 10% of your body weight over a period of 12 months.  If over a period of six (6) months you have unsuccessfully tried to lose weight, then it is time for  you to seek professional help and to enter a medically supervised weight management program, and/or undergo bariatric surgery.   Pain management considerations and possible limitations:  1.    Pharmacological Problems: Be advised that the use of opioid analgesics (oxycodone ; hydrocodone ; morphine; methadone ; codeine; and all of their derivatives) have been associated with decreased metabolism and weight gain.  For this reason, should we see that you are unable to lose weight while taking these medications, it may become necessary for us  to taper down and indefinitely discontinue them.  2.    Technical Problems: The incidence of successful interventional therapies decreases as the patient's BMI increases. It is much more difficult to accomplish a safe and effective interventional therapy on a patient with a BMI above 35. 3.    Radiation Exposure Problems: The x-rays machine, used to accomplish injection therapies, will automatically increase their x-ray output in order to capture an appropriate bone image. This means that radiation  exposure increases exponentially with the patient's BMI. (The higher the BMI, the higher the radiation exposure.) Although the level of radiation used at a given time is still safe to the patient, it is not for the physician and/or assisting staff. Unfortunately, radiation exposure is accumulative. Because physicians and the staff have to do procedures and be exposed on a daily basis, this can result in health problems such as cancer and radiation burns. Radiation exposure to the staff is monitored by the radiation batches that they wear. The exposure levels are reported back to the staff on a quarterly basis. Depending on levels of exposure, physicians and staff may be obligated by law to decrease this exposure. This means that they have the right and obligation to refuse providing therapies where they may be overexposed to radiation. For this reason, physicians may decline to offer therapies such as radiofrequency ablation or implants to patients with a BMI above 40. 4.    Current Trends: Be advised that the current trend is to no longer offer certain therapies to patients with a BMI equal to, or above 35, due to increase perioperative risks, increased technical procedural difficulties, and excessive radiation exposure to healthcare personnel.  Last updated: 04/11/2023 ______________________________________________________________________

## 2023-07-21 NOTE — Progress Notes (Signed)
 Nursing Pain Medication Assessment:  Safety precautions to be maintained throughout the outpatient stay will include: orient to surroundings, keep bed in low position, maintain call bell within reach at all times, provide assistance with transfer out of bed and ambulation.   Medication Inspection Compliance: Pill count conducted under aseptic conditions, in front of the patient. Neither the pills nor the bottle was removed from the patient's sight at any time. Once count was completed pills were immediately returned to the patient in their original bottle.  Medication: Oxycodone  IR Pill/Patch Count:  26 of 120 pills remain Pill/Patch Appearance: Markings consistent with prescribed medication Bottle Appearance: Standard pharmacy container. Clearly labeled. Filled Date: 33 / 08 / 2024 Last Medication intake:  Today

## 2023-07-21 NOTE — ED Provider Notes (Signed)
 [place] EMERGENCY DEPARTMENT AT [facility] Provider Note   CSN: [id number] Arrival date & time: 07/20/23  2030     History  Chief Complaint  Patient presents with   Abscess    Nathan CROME Sky Mickey. is a 43 y.o. male.  The history is provided by the patient.  Patient presents for concern for an abscess under his chin.  He reports he has had a bump there for several years, but over the past 2 days it has gotten worse.  He reports some pain with swallowing.  No fevers or vomiting.     Home Medications Prior to Admission medications   Medication Sig Start Date End Date Taking? Authorizing Provider  azelastine (ASTELIN) 0.1 % nasal spray Place 2 sprays into both nostrils 2 (two) times daily. 04/03/20   [provider]  clobetasol ointment (TEMOVATE) 0.05 % Apply topically to feet and hands twice daily as needed. 11/13/19   [provider]  doxycycline  (VIBRA -TABS) 100 MG tablet Take 1 tablet (100 mg total) by mouth 2 (two) times daily. 05/24/22   Menshew, Candida LULLA Kings, PA-C  econazole nitrate 1 % cream Apply between the toes twice daily 02/04/20   [provider]  famotidine (PEPCID) 20 MG tablet Take 20 mg by mouth 2 (two) times daily. 12/01/20   [provider]  fluticasone (FLONASE) 50 MCG/ACT nasal spray Place into both nostrils. 04/03/20   [provider]  gentamicin cream (GARAMYCIN) 0.1 % Apply between the toes twice daily 02/04/20   [provider]  glipiZIDE (GLUCOTROL) 5 MG tablet Take 5 mg by mouth 2 (two) times daily before a meal.    [provider]  loratadine (CLARITIN) 10 MG tablet Take 10 mg by mouth daily as needed for allergies.    [provider]  Magnesium  500 MG CAPS Take 1 capsule (500 mg total) by mouth 2 (two) times daily at 8 am and 10 pm. Patient not taking: Reported on 02/01/2023 05/07/20 01/27/22  Naveira, Francisco, MD  Magnesium  Oxide 500 MG TABS Take by mouth. 11/24/20    [provider]  montelukast (SINGULAIR) 10 MG tablet Take 10 mg by mouth at bedtime.    [provider]  naloxone  (NARCAN ) nasal spray 4 mg/0.1 mL Place 1 spray into the nose as needed for up to 365 doses (for opioid-induced respiratory depresssion). In case of emergency (overdose), spray once into each nostril. If no response within 3 minutes, repeat application and call 911. 04/20/23 04/19/24  Tanya Glisson, MD  Oxycodone  HCl 10 MG TABS Take 1 tablet (10 mg total) by mouth every 6 (six) hours as needed. Must last 30 days 04/27/23 05/27/23  Tanya Glisson, MD  Oxycodone  HCl 10 MG TABS Take 1 tablet (10 mg total) by mouth every 6 (six) hours as needed. Must last 30 days 05/27/23 06/26/23  Tanya Glisson, MD  Oxycodone  HCl 10 MG TABS Take 1 tablet (10 mg total) by mouth every 6 (six) hours as needed. Must last 30 days 06/26/23 07/26/23  Tanya Glisson, MD  terbinafine (LAMISIL) 1 % cream SMARTSIG:1 Topical Every Night 12/25/19   [provider]  testosterone  cypionate (DEPOTESTOSTERONE CYPIONATE) 200 MG/ML injection SMARTSIG:Milliliter(s) IM 12/24/20   [provider]      Allergies    Bactrim [sulfamethoxazole-trimethoprim], Pollen extract, Sulfa antibiotics, and Penicillins    Review of Systems   Review of Systems  Physical Exam Updated Vital Signs BP 108/69   Pulse 70   Temp  98.6 F (37 C) (Oral)   Resp 16   Ht 1.803 m (5' 11)   Wt 133.7 kg   SpO2 93%   BMI 41.10 kg/m  Physical Exam CONSTITUTIONAL: Well developed/well nourished, no distress HEAD: Normocephalic/atraumatic EYES: EOMI/PERRL ENMT: Mucous membranes moist NECK: Large tender mass noted to the submandibular region. No stridor, no drooling, no dysphonia.  Floor of mouth is soft.  No angioedema LUNGS: no apparent distress NEURO: Pt is awake/alert/appropriate, moves all extremitiesx4.  No facial droop.   SKIN: warm, color normal PSYCH: no abnormalities of mood noted, alert and  oriented to situation  ED Results / Procedures / Treatments   Labs (all labs ordered are listed, but only abnormal results are displayed) Labs Reviewed  BASIC METABOLIC PANEL - Abnormal; Notable for the following components:      Result Value   Glucose, Bld 113 (*)    All other components within normal limits  CBC WITH DIFFERENTIAL/PLATELET    EKG None  Radiology CT Soft Tissue Neck W Contrast Result Date: 07/21/2023 CLINICAL DATA:  Soft tissue infection suspected, neck, xray done EXAM: CT NECK WITH CONTRAST TECHNIQUE: Multidetector CT imaging of the neck was performed using the standard protocol following the bolus administration of intravenous contrast. RADIATION DOSE REDUCTION: This exam was performed according to the departmental dose-optimization program which includes automated exposure control, adjustment of the mA and/or kV according to patient size and/or use of iterative reconstruction technique. CONTRAST:  75mL OMNIPAQUE  IOHEXOL  300 MG/ML  SOLN COMPARISON:  None Available. FINDINGS: Pharynx and larynx: Normal. No mass or swelling. Salivary glands: No inflammation, mass, or stone. Thyroid: Normal. Lymph nodes: Two paramidline mildly enlarged 1.1 cm level I lymph nodes. The left-sided node is hypodense suggesting possible suppurative change. Vascular: Nondiagnostic evaluation due to non arterial timing. Limited intracranial: Negative. Visualized orbits: Negative. Mastoids and visualized paranasal sinuses: Clear. Skeleton: No acute fracture on limited assessment. Upper chest: Visualized lung apices are clear. Other: Approximately 1.7 cm peripherally enhancing fluid collection in the superficial subcutaneous soft tissues of the chin (for example see series 2, image 83 and series 5, image 54). Surrounding edema and fat stranding. Thickening of the adjacent platysma. IMPRESSION: 1. Approximately 1.7 cm peripherally enhancing fluid collection in the superficial subcutaneous soft tissues of the  chin, compatible with abscess. Surrounding edema/cellulitis. 2. Two paramidline mildly enlarged 1.1 cm level I lymph nodes may be reactive given the above findings. The left-sided node is hypodense suggesting possible early suppurative change. Recommend clinical follow-up to ensure improvement with treatment. Electronically Signed   By: Gilmore GORMAN Molt M.D.   On: 07/21/2023 00:48    Procedures .Incision and Drainage  Date/Time: 07/21/2023 3:36 AM  Performed by: Midge Golas, MD Authorized by: Midge Golas, MD   Consent:    Consent obtained:  Verbal   Consent given by:  Patient Universal protocol:    Patient identity confirmed:  Provided demographic data Location:    Type:  Abscess   Location:  Neck Pre-procedure details:    Skin preparation:  Povidone-iodine  Anesthesia:    Anesthesia method:  Local infiltration   Local anesthetic:  Lidocaine  2% WITH epi Procedure type:    Complexity:  Complex Procedure details:    Incision types:  Single straight   Wound management:  Probed and deloculated   Drainage:  Bloody and purulent   Drainage amount:  Moderate   Wound treatment:  Wound left open Post-procedure details:    Procedure completion:  Tolerated well, no immediate complications Comments:  Patient with cutaneous abscess that was easily drained.  Patient improved.  No acute complications.     Medications Ordered in ED Medications  iohexol  (OMNIPAQUE ) 300 MG/ML solution 75 mL (75 mLs Intravenous Contrast Given 07/21/23 0015)  ketorolac  (TORADOL ) 15 MG/ML injection 15 mg (15 mg Intravenous Given 07/21/23 0028)  lidocaine -EPINEPHrine  (XYLOCAINE  W/EPI) 2 %-1:200000 (PF) injection 20 mL (20 mLs Infiltration Given 07/21/23 0102)  povidone-iodine  (BETADINE ) 10 % external solution (1 Application  Given 07/21/23 0105)  fentaNYL  (SUBLIMAZE ) injection 100 mcg (100 mcg Intravenous Given 07/21/23 0143)  fentaNYL  (SUBLIMAZE ) injection 100 mcg (100 mcg Intravenous Given 07/21/23 0316)     ED Course/ Medical Decision Making/ A&P Clinical Course as of 07/21/23 0337  Thu Jul 21, 2023  0120 Overall CT imaging reassuring but does have cutaneous abscess.  He is in no acute distress.  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  He does not wish to have that drained at this time.  He does agree to have this abscess on his neck drain.  He is already on doxycycline  chronically because he has a history of hidradenitis  Will plan to drain abscess on his anterior neck, and refer to his dermatologist [DW]    Clinical Course User Index [DW] Midge Golas, MD                                 Medical Decision Making Risk Prescription drug management.           Final Clinical Impression(s) / ED Diagnoses Final diagnoses:  Abscess  Pilonidal abscess    Rx / DC Orders ED Discharge Orders     None         Midge Golas, MD 07/21/23 [phone]

## 2023-07-21 NOTE — Progress Notes (Signed)
 PROVIDER NOTE: Information contained herein reflects review and annotations entered in association with encounter. Interpretation of such information and data should be left to medically-trained personnel. Information provided to patient can be located elsewhere in the medical record under Patient Instructions. Document created using STT-dictation technology, any transcriptional errors that may result from process are unintentional.    Patient: Nathan Lambert.  Service Category: E/M  Provider: Eric DELENA Como, MD  DOB: 1981/01/09  DOS: 07/21/2023  Referring Provider: Pecolia Senior, MD  MRN: [id number]  Specialty: Interventional Pain Management  PCP: Nathan Lambert Senior, MD  Type: Established Patient  Setting: Ambulatory outpatient    Location: Office  Delivery: Face-to-face     HPI  [name], a 43 y.o. year old male, is here today because of his Chronic pain syndrome [G89.4]. [name] primary complain today is Back Pain  Pertinent problems: 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); Chronic lower extremity pain (3ry area of Pain) (Bilateral) (L>R); Lumbar facet hypertrophy (Bilateral); Lumbar facet syndrome (Bilateral) (R>L); Neurogenic pain; Spondylosis without myelopathy or radiculopathy, lumbar region; Other specified dorsopathies, sacral and sacrococcygeal region; Lumbar spondylosis; Chronic upper extremity pain (Left); Pain and numbness of left upper extremity; Cervical radiculitis (C6/C7) (Left); DDD (degenerative disc disease), cervical; Cervicalgia;  Osteoarthritis involving multiple joints; Chronic hip pain (4th area of Pain) (Bilateral) (L>R); Greater trochanteric bursitis (Left); Lumbar back pain with radiculopathy affecting left lower extremity (L5 dermatomal distribution); Subacute lumbar radiculopathy (L5) (Left); Chronic low back pain (1ry area of Pain) (Bilateral) w/o sciatica; Decreased range of motion of both hips; Chronic hip pain (Left); Enthesopathy of hip region (Left); Other bursitis of hip (Left); Chronic groin pain (Left); Headache disorder; Cervical radiculopathy; Lumbar radiculopathy; Chronic hip pain (Right); and Lumbar facet joint pain on their pertinent problem list. Pain Assessment: Severity of Chronic pain is reported as a 4 /10. Location: Back Lower, Right, Left/Denies. Onset: More than a month ago. Quality: Constant, Aching, Throbbing, Sharp. Timing: Constant. Modifying factor(s): Pain medication and rest. Vitals:  height is 5' 11 (1.803 m) and weight is 297 lb (134.7 kg). His temporal temperature is 98.6 F (37 C). His blood pressure is 131/83 and his pulse is 78. His respiration is 16 and oxygen saturation is 97%.  BMI: Estimated body mass index is 41.42 kg/m as calculated from the following:   Height as of this encounter: 5' 11 (1.803 m).   Weight as of this encounter: 297 lb (134.7 kg). Last encounter: 04/20/2023. Last procedure: 02/01/2023.  Reason for encounter: medication management.  The patient indicates doing well with the current medication regimen. No adverse reactions or side effects reported to the medications.   Discussed the use of AI scribe software for clinical note transcription with the patient, who gave verbal consent to proceed.  History of Present Illness   The patient, diagnosed with Hidradenitis Suppurativa (HS), presented with two abscesses, one under the chin and another on the buttocks. The chin abscess had recently been drained in the emergency room due to significant swelling and pain,  particularly during swallowing, which raised concerns about potential airway obstruction. Despite drainage, the patient reported persistent discomfort and swelling, suggesting incomplete resolution. The buttock  abscess was not addressed during the emergency room visit.  The patient had been on a long-term regimen of doxycycline  for HS management. Despite this, the development of the abscesses raised concerns about potential antibiotic resistance. The patient denied any adverse reactions to their current pain medication. The patient's pharmacy information remained unchanged.     RTCB: 10/24/2023   Pharmacotherapy Assessment  Analgesic:  Oxycodone  IR 10 mg 4 times daily. MME/day: 60 mg/day.   Monitoring: [place] PMP: PDMP reviewed during this encounter.       Pharmacotherapy: No side-effects or adverse reactions reported. Compliance: No problems identified. Effectiveness: Clinically acceptable.  Nathan Norris, RN  07/21/2023 11:57 AM  Sign when Signing Visit Nursing Pain Medication Assessment:  Safety precautions to be maintained throughout the outpatient stay will include: orient to surroundings, keep bed in low position, maintain call bell within reach at all times, provide assistance with transfer out of bed and ambulation.   Medication Inspection Compliance: Pill count conducted under aseptic conditions, in front of the patient. Neither the pills nor the bottle was removed from the patient's sight at any time. Once count was completed pills were immediately returned to the patient in their original bottle.  Medication: Oxycodone  IR Pill/Patch Count:  26 of 120 pills remain Pill/Patch Appearance: Markings consistent with prescribed medication Bottle Appearance: Standard pharmacy container. Clearly labeled. Filled Date: 57 / 08 / 2024 Last Medication intake:  Today    No results found for: CBDTHCR No results found for: D8THCCBX No results found for: D9THCCBX  UDS:  Summary  Date Value  Ref Range Status  01/24/2023 Note  Final    Comment:    ==================================================================== ToxASSURE Select 13 (MW) ==================================================================== Specimen Alert ToxAssure, ToxAssure Flex or Mat drug testing: -Engineer, production- Result certification performed at [facility], [address] [place], [place] [address]. [phone] Lab Director Arnulfo Finder, PhrmD. ==================================================================== Test                             Result       Flag       Units  Drug Present   Oxycodone                       839                     ng/mg creat   Oxymorphone                    1277                    ng/mg creat   Noroxycodone                   868                     ng/mg creat   Noroxymorphone                 349                     ng/mg creat    Sources of oxycodone  are scheduled prescription medications.    Oxymorphone, noroxycodone, and noroxymorphone are expected    metabolites of oxycodone . Oxymorphone is also available as a    scheduled prescription medication.  ==================================================================== Test  Result    Flag   Units      Ref Range   Creatinine              186              mg/dL      >=79 ==================================================================== Declared Medications:  Medication list was not provided. ==================================================================== For clinical consultation, please call [phone]. ====================================================================       ROS  Constitutional: Denies any fever or chills Gastrointestinal: No reported hemesis, hematochezia, vomiting, or acute GI distress Musculoskeletal: Denies any acute onset joint swelling, redness, loss of ROM, or weakness Neurological: No reported episodes of acute onset apraxia, aphasia,  dysarthria, agnosia, amnesia, paralysis, loss of coordination, or loss of consciousness  Medication Review  Oxycodone  HCl, Semaglutide(0.25 or 0.5MG /DOS), azelastine, clobetasol ointment, doxycycline , econazole nitrate, famotidine, gentamicin cream, loratadine, naloxone , rosuvastatin, terbinafine, and testosterone  cypionate  History Review  Allergy: Mr. Gallardo is allergic to bactrim [sulfamethoxazole-trimethoprim], pollen extract, sulfa antibiotics, and penicillins. Drug: Mr. Cowin  reports no history of drug use. Alcohol:  reports no history of alcohol use. Tobacco:  reports that he has quit smoking. His smoking use included cigarettes. He has a 10 pack-year smoking history. He has never used smokeless tobacco. Social: Mr. Alcala  reports that he has quit smoking. His smoking use included cigarettes. He has a 10 pack-year smoking history. He has never used smokeless tobacco. He reports that he does not drink alcohol and does not use drugs. Medical:  has a past medical history of Allergy (2012), Chronic low back pain (Primary Area of Pain) (Bilateral) (R>L), Degeneration of lumbar or lumbosacral intervertebral disc (L4-L5) (12/27/2016), Gastroesophageal reflux disease without esophagitis (10/26/2021), Headache, Mixed hyperlipidemia (10/26/2021), and Opiate use (02/15/2017). Surgical: Mr. Shelby  has a past surgical history that includes Fracture surgery (Left); Spine surgery (Right, 06/16/2015); and Back surgery. Family: family history includes AAA (abdominal aortic aneurysm) in his mother; Diabetes in his father; Stroke in his mother.  Laboratory Chemistry Profile   Renal Lab Results  Component Value Date   BUN 11 07/20/2023   CREATININE 0.90 07/20/2023   BCR 7 (L) 12/27/2016   GFRAA >60 06/07/2019   GFRNONAA >60 07/20/2023    Hepatic Lab Results  Component Value Date   AST 16 05/23/2022   ALT 12 05/23/2022   ALBUMIN 4.0 05/23/2022   ALKPHOS 51 05/23/2022     Electrolytes Lab Results  Component Value Date   NA 138 07/20/2023   K 4.3 07/20/2023   CL 105 07/20/2023   CALCIUM  9.2 07/20/2023   MG 2.0 06/07/2019    Bone Lab Results  Component Value Date   VD25OH 15.51 (L) 06/07/2019   25OHVITD1 17 (L) 12/27/2016   25OHVITD2 <1.0 12/27/2016   25OHVITD3 17 12/27/2016   TESTOFREE 2.6 (L) 06/08/2017   TESTOSTERONE  116 (L) 06/08/2017    Inflammation (CRP: Acute Phase) (ESR: Chronic Phase) Lab Results  Component Value Date   CRP 2.7 (H) 06/07/2019   ESRSEDRATE 17 (H) 06/07/2019         Note: Above Lab results reviewed.  Recent Imaging Review  CT Soft Tissue Neck W Contrast CLINICAL DATA:  Soft tissue infection suspected, neck, xray done  EXAM: CT NECK WITH CONTRAST  TECHNIQUE: Multidetector CT imaging of the neck was performed using the standard protocol following the bolus administration of intravenous contrast.  RADIATION DOSE REDUCTION: This exam was performed according to the departmental dose-optimization program which includes automated exposure control, adjustment of the mA  and/or kV according to patient size and/or use of iterative reconstruction technique.  CONTRAST:  75mL OMNIPAQUE  IOHEXOL  300 MG/ML  SOLN  COMPARISON:  None Available.  FINDINGS: Pharynx and larynx: Normal. No mass or swelling.  Salivary glands: No inflammation, mass, or stone.  Thyroid: Normal.  Lymph nodes: Two paramidline mildly enlarged 1.1 cm level I lymph nodes. The left-sided node is hypodense suggesting possible suppurative change.  Vascular: Nondiagnostic evaluation due to non arterial timing.  Limited intracranial: Negative.  Visualized orbits: Negative.  Mastoids and visualized paranasal sinuses: Clear.  Skeleton: No acute fracture on limited assessment.  Upper chest: Visualized lung apices are clear.  Other: Approximately 1.7 cm peripherally enhancing fluid collection in the superficial subcutaneous soft tissues of the  chin (for example see series 2, image 83 and series 5, image 54). Surrounding edema and fat stranding. Thickening of the adjacent platysma.  IMPRESSION: 1. Approximately 1.7 cm peripherally enhancing fluid collection in the superficial subcutaneous soft tissues of the chin, compatible with abscess. Surrounding edema/cellulitis. 2. Two paramidline mildly enlarged 1.1 cm level I lymph nodes may be reactive given the above findings. The left-sided node is hypodense suggesting possible early suppurative change. Recommend clinical follow-up to ensure improvement with treatment.  Electronically Signed   By: Gilmore GORMAN Molt M.D.   On: 07/21/2023 00:48 Note: Reviewed        Physical Exam  General appearance: Well nourished, well developed, and well hydrated. In no apparent acute distress Mental status: Alert, oriented x 3 (person, place, & time)       Respiratory: No evidence of acute respiratory distress Eyes: PERLA Vitals: BP 131/83 (Cuff Size: Normal)   Pulse 78   Temp 98.6 F (37 C) (Temporal)   Resp 16   Ht 5' 11 (1.803 m)   Wt 297 lb (134.7 kg)   SpO2 97%   BMI 41.42 kg/m  BMI: Estimated body mass index is 41.42 kg/m as calculated from the following:   Height as of this encounter: 5' 11 (1.803 m).   Weight as of this encounter: 297 lb (134.7 kg). Ideal: Ideal body weight: 75.3 kg (166 lb 0.1 oz) Adjusted ideal body weight: 99.1 kg (218 lb 6.5 oz)  Physical Exam   SKIN: Accumulation of material under the chin noted.       Assessment   Diagnosis Status  1. Chronic pain syndrome   2. Chronic low back pain (1ry area of Pain) (Bilateral) w/o sciatica   3. Chronic knee pain (2ry area of Pain) (Left)   4. Chronic lower extremity pain (3ry area of Pain) (Bilateral) (L>R)   5. Chronic hip pain (4th area of Pain) (Bilateral) (L>R)   6. 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 Pharmacologic therapy   8. Chronic use of opiate for therapeutic purpose   9.  Encounter for medication management   10. Encounter for chronic pain management   11. Abscess of chin   12. Abscess of mandible   13. Cellulitis and abscess of buttock    Controlled Controlled Controlled   Updated Problems: Problem  Abscess of Chin  Abscess of Mandible  Cellulitis and Abscess of Buttock    Plan of Care  Problem-specific:  Assessment and Plan    Abscess Further drainage of the chin abscess is recommended due to suspected residual accumulation after partial drainage in the emergency room. The buttocks abscess, which was not treated, also requires drainage. Immediate medical attention is advised for further management and culture of both  abscesses to determine appropriate antibiotic treatment.  Hidradenitis Suppurativa (HS) They are experiencing significant pain and swelling from recurrent abscesses under the chin and on the buttocks, characteristic of their chronic condition, Hidradenitis Suppurativa. Despite drainage of the chin abscess in the emergency room, the buttocks abscess remains unaddressed. The current doxycycline  treatment may be ineffective, suggesting potential antibiotic resistance. A change in antibiotics is recommended, alongside culture and sensitivity testing to identify an effective treatment. Immediate follow-up with the emergency department or primary care provider is advised for further drainage and culture. Refills for pain medication will be sent to the pharmacy.       Mr. Tripton Ned. has a current medication list which includes the following long-term medication(s): azelastine, famotidine, loratadine, [START ON 07/26/2023] oxycodone  hcl, [START ON 08/25/2023] oxycodone  hcl, [START ON 09/24/2023] oxycodone  hcl, rosuvastatin, and naloxone .  Pharmacotherapy (Medications Ordered): Meds ordered this encounter  Medications   Oxycodone  HCl 10 MG TABS    Sig: Take 1 tablet (10 mg total) by mouth every 6 (six) hours as needed. Must last 30  days    Dispense:  120 tablet    Refill:  0    DO NOT: delete (not duplicate); no partial-fill (will deny script to complete), no refill request (F/U required). DISPENSE: 1 day early if closed on fill date. WARN: No CNS-depressants within 8 hrs of med.   Oxycodone  HCl 10 MG TABS    Sig: Take 1 tablet (10 mg total) by mouth every 6 (six) hours as needed. Must last 30 days    Dispense:  120 tablet    Refill:  0    DO NOT: delete (not duplicate); no partial-fill (will deny script to complete), no refill request (F/U required). DISPENSE: 1 day early if closed on fill date. WARN: No CNS-depressants within 8 hrs of med.   Oxycodone  HCl 10 MG TABS    Sig: Take 1 tablet (10 mg total) by mouth every 6 (six) hours as needed. Must last 30 days    Dispense:  120 tablet    Refill:  0    DO NOT: delete (not duplicate); no partial-fill (will deny script to complete), no refill request (F/U required). DISPENSE: 1 day early if closed on fill date. WARN: No CNS-depressants within 8 hrs of med.   Orders:  No orders of the defined types were placed in this encounter.  Follow-up plan:   Return in about 3 months (around 10/24/2023) for Eval-day (M,W), (F2F), (MM).      Interventional Therapies  Risk Factors  Considerations:   MO  GERD  Tobacco abuse  Hypogonadonism     Planned  Pending:      Under consideration:   Possible left SI joint RFA Diagnostic caudal ESI + diagnostic epidurogram  Possible Racz procedure  Diagnostic left IA knee injection (w/ steroid)  Possible left IA Hyalgan knee injections  Diagnostic left Genicular NB  Possible left Genicular nerve RFA    Completed:   Diagnostic/therapeutic left IA hip joint + bursa injection x1 (11/25/2020) (100/100/0/0)  Diagnostic/therapeutic right IA hip joint + bursa injection x1 (02/01/2023) (100/100/50/50)  Therapeutic right lumbar facet MBB x4 (02/01/2023) (100/100/50/50)  Therapeutic left lumbar facet MBB x3 (05/15/2020) 100/100/50/50)   Therapeutic right lumbar facet RFA x1 (10/11/2017) NR Therapeutic left lumbar facet RFA x1 (11/24/2017) NR Therapeutic right SI joint block x1 (05/19/2017)  Therapeutic left SI joint block x1 (05/19/2017)  Therapeutic right SI joint RFA x1 (10/11/2017)  Therapeutic right L4 TFES2 x1 (05/30/2018)  Therapeutic  Palliative (PRN) options:   Palliative bilateral lumbar facet block #4 Palliative bilateral SI joint block #2  Diagnostic right L4 TFES2 #2    Pharmacotherapy  Nonopioids transferred 05/07/2020: Magnesium , vitamin D3, and calcium        Recent Visits No visits were found meeting these conditions. Showing recent visits within past 90 days and meeting all other requirements Today's Visits Date Type Provider Dept  07/21/23 Office Visit Tanya Glisson, MD [facility]  Showing today's visits and meeting all other requirements Future Appointments Date Type Provider Dept  10/19/23 Appointment Tanya Glisson, MD [facility]  Showing future appointments within next 90 days and meeting all other requirements  I discussed the assessment and treatment plan with the patient. The patient was provided an opportunity to ask questions and all were answered. The patient agreed with the plan and demonstrated an understanding of the instructions.  Patient advised to call back or seek an in-person evaluation if the symptoms or condition worsens.  Duration of encounter: 36 minutes.  Total time on encounter, as per AMA guidelines included both the face-to-face and non-face-to-face time personally spent by the physician and/or other qualified health care professional(s) on the day of the encounter (includes time in activities that require the physician or other qualified health care professional and does not include time in activities normally performed by clinical staff). Physician's time may include the following activities when performed: Preparing to see the patient  (e.g., pre-charting review of records, searching for previously ordered imaging, lab work, and nerve conduction tests) Review of prior analgesic pharmacotherapies. Reviewing PMP Interpreting ordered tests (e.g., lab work, imaging, nerve conduction tests) Performing post-procedure evaluations, including interpretation of diagnostic procedures Obtaining and/or reviewing separately obtained history Performing a medically appropriate examination and/or evaluation Counseling and educating the patient/family/caregiver Ordering medications, tests, or procedures Referring and communicating with other health care professionals (when not separately reported) Documenting clinical information in the electronic or other health record Independently interpreting results (not separately reported) and communicating results to the patient/ family/caregiver Care coordination (not separately reported)  Note by: Glisson DELENA Tanya, MD Date: 07/21/2023; Time: 1:03 PM

## 2023-07-25 ENCOUNTER — Encounter: Payer: BLUE CROSS/BLUE SHIELD | Admitting: Pain Medicine

## 2023-10-17 NOTE — Progress Notes (Unsigned)
 PROVIDER NOTE: Information contained herein reflects review and annotations entered in association with encounter. Interpretation of such information and data should be left to medically-trained personnel. Information provided to patient can be located elsewhere in the medical record under "Patient Instructions". Document created using STT-dictation technology, any transcriptional errors that may result from process are unintentional.    Patient: Nathan Lambert.  Service Category: E/M  Provider: Oswaldo Done, MD  DOB: September 13, 1980  DOS: 10/19/2023  Referring Provider: Smith Robert, MD  MRN: [id number]  Specialty: Interventional Pain Management  PCP: Nathan Robert, MD  Type: Established Patient  Setting: Ambulatory outpatient    Location: Office  Delivery: Face-to-face     HPI  [name], a 43 y.o. year old male, is here today because of his No primary diagnosis found.. [name] primary complain today is No chief complaint on file.  Pertinent problems: 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); Chronic lower extremity pain (3ry area of Pain) (Bilateral) (L>R); Lumbar facet hypertrophy (Bilateral); Lumbar facet syndrome (Bilateral) (R>L); Neurogenic pain; Spondylosis without myelopathy or radiculopathy, lumbar region; Other specified dorsopathies, sacral and sacrococcygeal region; Lumbar spondylosis; Chronic upper extremity pain (Left); Pain and numbness of left upper extremity; Cervical radiculitis (C6/C7) (Left); DDD (degenerative disc disease), cervical;  Cervicalgia; Osteoarthritis involving multiple joints; Chronic hip pain (4th area of Pain) (Bilateral) (L>R); Greater trochanteric bursitis (Left); Lumbar back pain with radiculopathy affecting left lower extremity (L5 dermatomal distribution); Subacute lumbar radiculopathy (L5) (Left); Chronic low back pain (1ry area of Pain) (Bilateral) w/o sciatica; Decreased range of motion of both hips; Chronic hip pain (Left); Enthesopathy of hip region (Left); Other bursitis of hip (Left); Chronic groin pain (Left); Headache disorder; Cervical radiculopathy; Lumbar radiculopathy; Chronic hip pain (Right); and Lumbar facet joint pain on their pertinent problem list. Pain Assessment: Severity of   is reported as a  /10. Location:    / . Onset:  . Quality:  . Timing:  . Modifying factor(s):  Marland Kitchen Vitals:  vitals were not taken for this visit.  BMI: Estimated body mass index is 41.42 kg/m as calculated from the following:   Height as of 07/21/23: 5\' 11"  (1.803 m).   Weight as of 07/21/23: 297 lb (134.7 kg). Last encounter: 07/21/2023. Last procedure: 02/01/2023.  Reason for encounter: medication management. ***  Discussed the use of AI scribe software for clinical note transcription with the patient, who gave verbal consent to proceed.  History of Present Illness         RTCB: 01/22/2024   Pharmacotherapy Assessment  Analgesic: Oxycodone IR 10 mg 4 times daily. MME/day: 60 mg/day.   Monitoring: [place] PMP: PDMP reviewed during this encounter.       Pharmacotherapy: No side-effects or adverse reactions reported. Compliance: No problems identified. Effectiveness: Clinically acceptable.  No notes on file  No results found for: "CBDTHCR" No results found for: "D8THCCBX" No results found for: "D9THCCBX"  UDS:  Summary  Date Value Ref Range Status  01/24/2023 Note  Final    Comment:    ==================================================================== ToxASSURE Select 13  (MW) ==================================================================== Specimen Alert ToxAssure, ToxAssure Flex or Mat drug testing: -Technical component- Result certification performed at [facility], [address] [place], [place] [address]. [phone] Lab Director Cherylann Ratel, PhrmD. ==================================================================== Test                             Result       Flag       Units  Drug Present   Oxycodone                      839                     ng/mg creat   Oxymorphone                    1277                    ng/mg creat   Noroxycodone                   868                     ng/mg creat   Noroxymorphone                 349                     ng/mg creat    Sources of oxycodone are scheduled prescription medications.    Oxymorphone, noroxycodone, and noroxymorphone are expected    metabolites of oxycodone. Oxymorphone is also available as a    scheduled prescription medication.  ==================================================================== Test                      Result    Flag   Units      Ref Range   Creatinine              186              mg/dL      >=86 ==================================================================== Declared Medications:  Medication list was not provided. ==================================================================== For clinical consultation, please call [phone]. ====================================================================       ROS  Constitutional: Denies any fever or chills Gastrointestinal: No reported hemesis, hematochezia, vomiting, or acute GI distress Musculoskeletal: Denies any acute onset joint swelling, redness, loss of ROM, or weakness Neurological: No reported episodes of acute onset apraxia, aphasia, dysarthria, agnosia, amnesia, paralysis, loss of coordination, or loss of consciousness  Medication Review  Oxycodone HCl, Semaglutide(0.25 or  0.5MG /DOS), azelastine, clobetasol ointment, doxycycline, econazole nitrate, famotidine, gentamicin cream, loratadine, naloxone, rosuvastatin, terbinafine, and testosterone cypionate  History Review  Allergy: [name] is allergic to bactrim [sulfamethoxazole-trimethoprim], pollen extract, sulfa antibiotics, and penicillins. Drug: Mr. Micale  reports no history of drug use. Alcohol:  reports no history of alcohol use. Tobacco:  reports that he has quit smoking. His smoking use included cigarettes. He has a 10 pack-year smoking history. He has never used smokeless tobacco. Social: Mr. Altergott  reports that he has quit smoking. His smoking use included cigarettes. He has a 10 pack-year smoking history. He has never used smokeless tobacco. He reports that he does not drink alcohol and does not use drugs. Medical:  has a past medical history of Allergy (2012), Chronic low back pain (Primary Area of Pain) (Bilateral) (R>L), Degeneration of lumbar or lumbosacral intervertebral disc (L4-L5) (12/27/2016), Gastroesophageal reflux disease without esophagitis (10/26/2021), Headache, Mixed hyperlipidemia (10/26/2021), and Opiate use (  02/15/2017). Surgical: Mr. Rehman  has a past surgical history that includes Fracture surgery (Left); Spine surgery (Right, 06/16/2015); and Back surgery. Family: family history includes AAA (abdominal aortic aneurysm) in his mother; Diabetes in his father; Stroke in his mother.  Laboratory Chemistry Profile   Renal Lab Results  Component Value Date   BUN 11 07/20/2023   CREATININE 0.90 07/20/2023   BCR 7 (L) 12/27/2016   GFRAA >60 06/07/2019   GFRNONAA >60 07/20/2023    Hepatic Lab Results  Component Value Date   AST 16 05/23/2022   ALT 12 05/23/2022   ALBUMIN 4.0 05/23/2022   ALKPHOS 51 05/23/2022    Electrolytes Lab Results  Component Value Date   NA 138 07/20/2023   K 4.3 07/20/2023   CL 105 07/20/2023   CALCIUM 9.2 07/20/2023   MG 2.0  06/07/2019    Bone Lab Results  Component Value Date   VD25OH 15.51 (L) 06/07/2019   25OHVITD1 17 (L) 12/27/2016   25OHVITD2 <1.0 12/27/2016   25OHVITD3 17 12/27/2016   TESTOFREE 2.6 (L) 06/08/2017   TESTOSTERONE 116 (L) 06/08/2017    Inflammation (CRP: Acute Phase) (ESR: Chronic Phase) Lab Results  Component Value Date   CRP 2.7 (H) 06/07/2019   ESRSEDRATE 17 (H) 06/07/2019         Note: Above Lab results reviewed.  Recent Imaging Review  CT Soft Tissue Neck W Contrast CLINICAL DATA:  Soft tissue infection suspected, neck, xray Lambert  EXAM: CT NECK WITH CONTRAST  TECHNIQUE: Multidetector CT imaging of the neck was performed using the standard protocol following the bolus administration of intravenous contrast.  RADIATION DOSE REDUCTION: This exam was performed according to the departmental dose-optimization program which includes automated exposure control, adjustment of the mA and/or kV according to patient size and/or use of iterative reconstruction technique.  CONTRAST:  75mL OMNIPAQUE IOHEXOL 300 MG/ML  SOLN  COMPARISON:  None Available.  FINDINGS: Pharynx and larynx: Normal. No mass or swelling.  Salivary glands: No inflammation, mass, or stone.  Thyroid: Normal.  Lymph nodes: Two paramidline mildly enlarged 1.1 cm level I lymph nodes. The left-sided node is hypodense suggesting possible suppurative change.  Vascular: Nondiagnostic evaluation due to non arterial timing.  Limited intracranial: Negative.  Visualized orbits: Negative.  Mastoids and visualized paranasal sinuses: Clear.  Skeleton: No acute fracture on limited assessment.  Upper chest: Visualized lung apices are clear.  Other: Approximately 1.7 cm peripherally enhancing fluid collection in the superficial subcutaneous soft tissues of the chin (for example see series 2, image 83 and series 5, image 54). Surrounding edema and fat stranding. Thickening of the adjacent  platysma.  IMPRESSION: 1. Approximately 1.7 cm peripherally enhancing fluid collection in the superficial subcutaneous soft tissues of the chin, compatible with abscess. Surrounding edema/cellulitis. 2. Two paramidline mildly enlarged 1.1 cm level I lymph nodes may be reactive given the above findings. The left-sided node is hypodense suggesting possible early suppurative change. Recommend clinical follow-up to ensure improvement with treatment.  Electronically Signed   By: Feliberto Harts M.D.   On: 07/21/2023 00:48 Note: Reviewed        Physical Exam  General appearance: Well nourished, well developed, and well hydrated. In no apparent acute distress Mental status: Alert, oriented x 3 (person, place, & time)       Respiratory: No evidence of acute respiratory distress Eyes: PERLA Vitals: There were no vitals taken for this visit. BMI: Estimated body mass index is 41.42 kg/m as calculated from the  following:   Height as of 07/21/23: 5\' 11"  (1.803 m).   Weight as of 07/21/23: 297 lb (134.7 kg). Ideal: Patient weight not recorded  Assessment   Diagnosis Status  1. Chronic low back pain (1ry area of Pain) (Bilateral) w/o sciatica   2. Chronic knee pain (2ry area of Pain) (Left)   3. Chronic lower extremity pain (3ry area of Pain) (Bilateral) (L>R)   4. Chronic hip pain (4th area of Pain) (Bilateral) (L>R)   5. 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 Chronic pain syndrome   7. Pharmacologic therapy   8. Chronic use of opiate for therapeutic purpose   9. Encounter for medication management   10. Encounter for chronic pain management    Controlled Controlled Controlled   Updated Problems: No problems updated.  Plan of Care  Problem-specific:  Assessment and Plan            Mr. Isaia Hassell. has a current medication list which includes the following long-term medication(s): azelastine, famotidine, loratadine, naloxone, oxycodone hcl, and  rosuvastatin.  Pharmacotherapy (Medications Ordered): No orders of the defined types were placed in this encounter.  Orders:  No orders of the defined types were placed in this encounter.  Follow-up plan:   No follow-ups on file.      Interventional Therapies  Risk Factors  Considerations:   [place]  GERD  Tobacco abuse  Hypogonadonism     Planned  Pending:      Under consideration:   Possible left SI joint RFA Diagnostic caudal ESI + diagnostic epidurogram  Possible Racz procedure  Diagnostic left IA knee injection (w/ steroid)  Possible left IA Hyalgan knee injections  Diagnostic left Genicular NB  Possible left Genicular nerve RFA    Completed:   Diagnostic/therapeutic left IA hip joint + bursa injection x1 (11/25/2020) (100/100/0/0)  Diagnostic/therapeutic right IA hip joint + bursa injection x1 (02/01/2023) (100/100/50/50)  Therapeutic right lumbar facet MBB x4 (02/01/2023) (100/100/50/50)  Therapeutic left lumbar facet MBB x3 (05/15/2020) 100/100/50/50)  Therapeutic right lumbar facet RFA x1 (10/11/2017) NR Therapeutic left lumbar facet RFA x1 (11/24/2017) NR Therapeutic right SI joint block x1 (05/19/2017)  Therapeutic left SI joint block x1 (05/19/2017)  Therapeutic right SI joint RFA x1 (10/11/2017)  Therapeutic right L4 TFES2 x1 (05/30/2018)    Therapeutic  Palliative (PRN) options:   Palliative bilateral lumbar facet block #4 Palliative bilateral SI joint block #2  Diagnostic right L4 TFES2 #2    Pharmacotherapy  Nonopioids transferred 05/07/2020: Magnesium, vitamin D3, and calcium      Recent Visits Date Type Provider Dept  07/21/23 Office Visit Delano Metz, MD [facility]  Showing recent visits within past 90 days and meeting all other requirements Future Appointments Date Type Provider Dept  10/19/23 Appointment Delano Metz, MD [facility]  Showing future appointments within next 90 days and meeting all other  requirements  I discussed the assessment and treatment plan with the patient. The patient was provided an opportunity to ask questions and all were answered. The patient agreed with the plan and demonstrated an understanding of the instructions.  Patient advised to call back or seek an in-person evaluation if the symptoms or condition worsens.  Duration of encounter: *** minutes.  Total time on encounter, as per AMA guidelines included both the face-to-face and non-face-to-face time personally spent by the physician and/or other qualified health care professional(s) on the day of the encounter (includes time in activities that require the  physician or other qualified health care professional and does not include time in activities normally performed by clinical staff). Physician's time may include the following activities when performed: Preparing to see the patient (e.g., pre-charting review of records, searching for previously ordered imaging, lab work, and nerve conduction tests) Review of prior analgesic pharmacotherapies. Reviewing PMP Interpreting ordered tests (e.g., lab work, imaging, nerve conduction tests) Performing post-procedure evaluations, including interpretation of diagnostic procedures Obtaining and/or reviewing separately obtained history Performing a medically appropriate examination and/or evaluation Counseling and educating the patient/family/caregiver Ordering medications, tests, or procedures Referring and communicating with other health care professionals (when not separately reported) Documenting clinical information in the electronic or other health record Independently interpreting results (not separately reported) and communicating results to the patient/ family/caregiver Care coordination (not separately reported)  Note by: Nathan Done, MD Date: 10/19/2023; Time: 7:10 AM

## 2023-10-18 NOTE — Patient Instructions (Incomplete)

## 2023-10-19 ENCOUNTER — Encounter: Payer: Self-pay | Admitting: Pain Medicine

## 2023-10-19 ENCOUNTER — Ambulatory Visit: Payer: BLUE CROSS/BLUE SHIELD | Attending: Pain Medicine | Admitting: Pain Medicine

## 2023-10-19 VITALS — BP 130/84 | HR 72 | Temp 97.4°F | Resp 20 | Ht 71.0 in | Wt 297.0 lb

## 2023-10-19 DIAGNOSIS — M79605 Pain in left leg: Secondary | ICD-10-CM | POA: Insufficient documentation

## 2023-10-19 DIAGNOSIS — M25562 Pain in left knee: Secondary | ICD-10-CM | POA: Diagnosis present

## 2023-10-19 DIAGNOSIS — M25551 Pain in right hip: Secondary | ICD-10-CM | POA: Insufficient documentation

## 2023-10-19 DIAGNOSIS — M25552 Pain in left hip: Secondary | ICD-10-CM | POA: Insufficient documentation

## 2023-10-19 DIAGNOSIS — M79604 Pain in right leg: Secondary | ICD-10-CM | POA: Diagnosis not present

## 2023-10-19 DIAGNOSIS — Z79891 Long term (current) use of opiate analgesic: Secondary | ICD-10-CM | POA: Diagnosis present

## 2023-10-19 DIAGNOSIS — G894 Chronic pain syndrome: Secondary | ICD-10-CM | POA: Diagnosis present

## 2023-10-19 DIAGNOSIS — G8929 Other chronic pain: Secondary | ICD-10-CM | POA: Insufficient documentation

## 2023-10-19 DIAGNOSIS — Z79899 Other long term (current) drug therapy: Secondary | ICD-10-CM | POA: Insufficient documentation

## 2023-10-19 DIAGNOSIS — M961 Postlaminectomy syndrome, not elsewhere classified: Secondary | ICD-10-CM | POA: Diagnosis present

## 2023-10-19 DIAGNOSIS — M545 Low back pain, unspecified: Secondary | ICD-10-CM | POA: Diagnosis present

## 2023-10-19 MED ORDER — OXYCODONE HCL 10 MG PO TABS: 10.0000 mg | ORAL_TABLET | Freq: Four times a day (QID) | ORAL | 0 refills | Status: DC | PRN

## 2023-10-19 NOTE — Progress Notes (Signed)
 Nursing Pain Medication Assessment:  Safety precautions to be maintained throughout the outpatient stay will include: orient to surroundings, keep bed in low position, maintain call bell within reach at all times, provide assistance with transfer out of bed and ambulation.  Medication Inspection Compliance: Pill count conducted under aseptic conditions, in front of the patient. Neither the pills nor the bottle was removed from the patient's sight at any time. Once count was completed pills were immediately returned to the patient in their original bottle.  Medication: Oxycodone IR Pill/Patch Count:  21 of 120 pills remain Pill/Patch Appearance: Markings consistent with prescribed medication Bottle Appearance: Standard pharmacy container. Clearly labeled. Filled Date: 03 / 08 / 2025 Last Medication intake:  Today

## 2024-01-18 ENCOUNTER — Encounter: Payer: Self-pay | Admitting: Nurse Practitioner

## 2024-01-18 ENCOUNTER — Ambulatory Visit: Attending: Nurse Practitioner | Admitting: Nurse Practitioner

## 2024-01-18 DIAGNOSIS — M545 Low back pain, unspecified: Secondary | ICD-10-CM | POA: Insufficient documentation

## 2024-01-18 DIAGNOSIS — M25551 Pain in right hip: Secondary | ICD-10-CM | POA: Insufficient documentation

## 2024-01-18 DIAGNOSIS — M961 Postlaminectomy syndrome, not elsewhere classified: Secondary | ICD-10-CM | POA: Diagnosis present

## 2024-01-18 DIAGNOSIS — M25562 Pain in left knee: Secondary | ICD-10-CM | POA: Insufficient documentation

## 2024-01-18 DIAGNOSIS — G894 Chronic pain syndrome: Secondary | ICD-10-CM | POA: Diagnosis present

## 2024-01-18 DIAGNOSIS — G8929 Other chronic pain: Secondary | ICD-10-CM | POA: Diagnosis present

## 2024-01-18 DIAGNOSIS — Z79899 Other long term (current) drug therapy: Secondary | ICD-10-CM | POA: Diagnosis present

## 2024-01-18 DIAGNOSIS — M79605 Pain in left leg: Secondary | ICD-10-CM | POA: Diagnosis not present

## 2024-01-18 DIAGNOSIS — Z79891 Long term (current) use of opiate analgesic: Secondary | ICD-10-CM

## 2024-01-18 DIAGNOSIS — M25552 Pain in left hip: Secondary | ICD-10-CM | POA: Diagnosis present

## 2024-01-18 DIAGNOSIS — M79604 Pain in right leg: Secondary | ICD-10-CM | POA: Diagnosis not present

## 2024-01-18 MED ORDER — OXYCODONE HCL 10 MG PO TABS: 10.0000 mg | ORAL_TABLET | Freq: Four times a day (QID) | ORAL | 0 refills | Status: DC | PRN

## 2024-01-18 NOTE — Progress Notes (Signed)
 Nursing Pain Medication Assessment:  Safety precautions to be maintained throughout the outpatient stay will include: orient to surroundings, keep bed in low position, maintain call bell within reach at all times, provide assistance with transfer out of bed and ambulation.   Medication Inspection Compliance: Pill count conducted under aseptic conditions, in front of the patient. Neither the pills nor the bottle was removed from the patient's sight at any time. Once count was completed pills were immediately returned to the patient in their original bottle.  Medication: Oxycodone  HCL Pill/Patch Count: 14 of 120 pills/patches remain Pill/Patch Appearance: Markings consistent with prescribed medication Bottle Appearance: Standard pharmacy container. Clearly labeled. Filled Date: 06 / 06 / 2025 Last Medication intake:  Today

## 2024-01-18 NOTE — Patient Instructions (Signed)
 Med refill prior to 04/21/24

## 2024-01-18 NOTE — Progress Notes (Signed)
 PROVIDER NOTE: Interpretation of information contained herein should be left to medically-trained personnel. Specific patient instructions are provided elsewhere under Patient Instructions section of medical record. This document was created in part using AI and STT-dictation technology, any transcriptional errors that may result from this process are unintentional.  Patient: Nathan Lambert.  Service: E/M   PCP: Pecolia Senior, MD  DOB: 28-Jan-1981  DOS: 01/18/2024  Provider: Emmy MARLA Blanch, NP  MRN: [id number]  Delivery: Face-to-face  Specialty: Interventional Pain Management  Type: Established Patient  Setting: Ambulatory outpatient facility  Specialty designation: 09  Referring Prov.: Pecolia Senior, MD  Location: Outpatient office facility       History of present illness (HPI) Mr. Braxon Suder., a 43 y.o. year old male, is here today because of his No primary diagnosis found.. Mr. Dy primary complain today is Back Pain  Pertinent problems: Mr. 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  Pain Assessment: Severity of Chronic pain is reported as a 3 /10. Location: Back Lower, Mid/When certain movements pain can go into hamstrings. Onset: More than a month ago. Quality:  . Timing: Constant. Modifying factor(s): Pain helps take the edge off and ice. Vitals:  height is 5' 11 (1.803 m) and weight is 298 lb (135.2 kg). His temporal temperature is 97.5 F (36.4 C) (abnormal). His blood pressure is 125/92 (abnormal) and his pulse is 71. His respiration is 16 and oxygen saturation is 98%.  BMI: Estimated body mass index is 41.56 kg/m as calculated from the following:   Height as of this encounter: 5' 11 (1.803 m).   Weight as of this  encounter: 298 lb (135.2 kg).  Last encounter: 10/19/2023 Last procedure: 02/01/2023  Reason for encounter: medication management.  The patient indicates doing well with current medication regimen.  No adverse reaction or side effects reported to medication.  His pharmacy remains unchanged, and he has brought his medication for pill count.   Pharmacotherapy Assessment   Oxycodone  HCl (IR) 10 mg tablet every 6 hours as needed for pain. MME=60 Monitoring: [place] PMP: PDMP reviewed during this encounter.       Pharmacotherapy: No side-effects or adverse reactions reported. Compliance: No problems identified. Effectiveness: Clinically acceptable.  [name], RN  01/18/2024  1:54 PM  Sign when Signing Visit Nursing Pain Medication Assessment:  Safety precautions to be maintained throughout the outpatient stay will include: orient to surroundings, keep bed in low position, maintain call bell within reach at all times, provide assistance with transfer out of bed and ambulation.   Medication Inspection Compliance: Pill count conducted under aseptic conditions, in front of the patient. Neither the pills nor the bottle was removed from the patient's sight at any time. Once count was completed pills were immediately returned to the patient in their original bottle.  Medication: Oxycodone  HCL Pill/Patch Count: 14 of 120 pills/patches remain Pill/Patch Appearance: Markings consistent with prescribed medication Bottle Appearance: Standard pharmacy container. Clearly labeled. Filled Date: 06 / 06 / 2025 Last Medication intake:  Today    UDS:  Summary  Date Value Ref Range Status  01/24/2023 Note  Final    Comment:    ==================================================================== ToxASSURE Select 13 (MW) ==================================================================== Specimen Alert ToxAssure, ToxAssure Flex or Mat drug testing: -Technical component- Result certification performed at  [facility], [address] [place], [place] [address]. [phone] Lab Director Arnulfo Finder, PhrmD. ==================================================================== Test                             Result       Flag       Units  Drug Present   Oxycodone                       839                     ng/mg creat   Oxymorphone                    1277                    ng/mg creat   Noroxycodone                   868                     ng/mg creat   Noroxymorphone                 349                     ng/mg creat    Sources of oxycodone  are scheduled prescription medications.    Oxymorphone, noroxycodone, and noroxymorphone are expected    metabolites of oxycodone . Oxymorphone is also available as a    scheduled prescription medication.  ==================================================================== Test                      Result    Flag   Units      Ref Range   Creatinine              186              mg/dL      >=79 ==================================================================== Declared Medications:  Medication list was not provided. ==================================================================== For clinical consultation, please call [phone]. ====================================================================     No results found for: CBDTHCR No results found for: D8THCCBX No results found for: D9THCCBX  ROS  Constitutional: Denies any fever or chills Gastrointestinal: No reported hemesis, hematochezia, vomiting, or acute GI distress Musculoskeletal: Lower back pain Neurological: No reported episodes of acute onset apraxia, aphasia, dysarthria, agnosia, amnesia, paralysis, loss of coordination, or loss of consciousness  Medication Review  Oxycodone  HCl, Semaglutide(0.25 or 0.5MG /DOS), azelastine, clobetasol ointment, doxycycline , econazole nitrate, famotidine, gentamicin cream, loratadine, naloxone , rosuvastatin, terbinafine,  and testosterone  cypionate  History Review  Allergy: Mr. Germano is allergic to bactrim [sulfamethoxazole-trimethoprim], pollen extract, sulfa antibiotics, and penicillins. Drug: Mr. Nigh  reports no history of drug use. Alcohol:  reports no history of alcohol use. Tobacco:  reports that he has quit smoking. His smoking use included cigarettes. He has a 10 pack-year smoking history. He has never used smokeless tobacco. Social: Mr. Palinkas  reports that he has quit smoking. His smoking use included cigarettes. He has a 10 pack-year smoking history. He has never used smokeless tobacco. He reports that he does not drink alcohol and does not use drugs. Medical:  has a past medical history of Allergy (2012), Chronic low back pain (Primary Area of Pain) (Bilateral) (R>L), Degeneration of lumbar or lumbosacral intervertebral disc (L4-L5) (12/27/2016), Gastroesophageal reflux disease without esophagitis (10/26/2021), Headache, Mixed hyperlipidemia (10/26/2021),  and Opiate use (02/15/2017). Surgical: Mr. Gladu  has a past surgical history that includes Fracture surgery (Left); Spine surgery (Right, 06/16/2015); and Back surgery. Family: family history includes AAA (abdominal aortic aneurysm) in his mother; Diabetes in his father; Stroke in his mother.  Laboratory Chemistry Profile   Renal Lab Results  Component Value Date   BUN 11 07/20/2023   CREATININE 0.90 07/20/2023   BCR 7 (L) 12/27/2016   GFRAA >60 06/07/2019   GFRNONAA >60 07/20/2023    Hepatic Lab Results  Component Value Date   AST 16 05/23/2022   ALT 12 05/23/2022   ALBUMIN 4.0 05/23/2022   ALKPHOS 51 05/23/2022    Electrolytes Lab Results  Component Value Date   NA 138 07/20/2023   K 4.3 07/20/2023   CL 105 07/20/2023   CALCIUM  9.2 07/20/2023   MG 2.0 06/07/2019    Bone Lab Results  Component Value Date   VD25OH 15.51 (L) 06/07/2019   25OHVITD1 17 (L) 12/27/2016   25OHVITD2 <1.0 12/27/2016   25OHVITD3 17  12/27/2016   TESTOFREE 2.6 (L) 06/08/2017   TESTOSTERONE  116 (L) 06/08/2017    Inflammation (CRP: Acute Phase) (ESR: Chronic Phase) Lab Results  Component Value Date   CRP 2.7 (H) 06/07/2019   ESRSEDRATE 17 (H) 06/07/2019         Note: Above Lab results reviewed.  Recent Imaging Review  CT Soft Tissue Neck W Contrast CLINICAL DATA:  Soft tissue infection suspected, neck, xray done  EXAM: CT NECK WITH CONTRAST  TECHNIQUE: Multidetector CT imaging of the neck was performed using the standard protocol following the bolus administration of intravenous contrast.  RADIATION DOSE REDUCTION: This exam was performed according to the departmental dose-optimization program which includes automated exposure control, adjustment of the mA and/or kV according to patient size and/or use of iterative reconstruction technique.  CONTRAST:  75mL OMNIPAQUE  IOHEXOL  300 MG/ML  SOLN  COMPARISON:  None Available.  FINDINGS: Pharynx and larynx: Normal. No mass or swelling.  Salivary glands: No inflammation, mass, or stone.  Thyroid: Normal.  Lymph nodes: Two paramidline mildly enlarged 1.1 cm level I lymph nodes. The left-sided node is hypodense suggesting possible suppurative change.  Vascular: Nondiagnostic evaluation due to non arterial timing.  Limited intracranial: Negative.  Visualized orbits: Negative.  Mastoids and visualized paranasal sinuses: Clear.  Skeleton: No acute fracture on limited assessment.  Upper chest: Visualized lung apices are clear.  Other: Approximately 1.7 cm peripherally enhancing fluid collection in the superficial subcutaneous soft tissues of the chin (for example see series 2, image 83 and series 5, image 54). Surrounding edema and fat stranding. Thickening of the adjacent platysma.  IMPRESSION: 1. Approximately 1.7 cm peripherally enhancing fluid collection in the superficial subcutaneous soft tissues of the chin, compatible with abscess.  Surrounding edema/cellulitis. 2. Two paramidline mildly enlarged 1.1 cm level I lymph nodes may be reactive given the above findings. The left-sided node is hypodense suggesting possible early suppurative change. Recommend clinical follow-up to ensure improvement with treatment.  Electronically Signed   By: Gilmore GORMAN Molt M.D.   On: 07/21/2023 00:48 Note: Reviewed        Physical Exam  Vitals: BP (!) 125/92 (Cuff Size: Large)   Pulse 71   Temp (!) 97.5 F (36.4 C) (Temporal)   Resp 16   Ht 5' 11 (1.803 m)   Wt 298 lb (135.2 kg)   SpO2 98%   BMI 41.56 kg/m  BMI: Estimated body mass index is 41.56 kg/m as  calculated from the following:   Height as of this encounter: 5' 11 (1.803 m).   Weight as of this encounter: 298 lb (135.2 kg). Ideal: Ideal body weight: 75.3 kg (166 lb 0.1 oz) Adjusted ideal body weight: 99.2 kg (218 lb 12.9 oz) General appearance: Well nourished, well developed, and well hydrated. In no apparent acute distress Mental status: Alert, oriented x 3 (person, place, & time)       Respiratory: No evidence of acute respiratory distress Eyes: PERLA Assessment   Diagnosis Status  1. Chronic low back pain (1ry area of Pain) (Bilateral) w/o sciatica   2. Chronic knee pain (2ry area of Pain) (Left)   3. Chronic lower extremity pain (3ry area of Pain) (Bilateral) (L>R)   4. Chronic hip pain (4th area of Pain) (Bilateral) (L>R)   5. 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 Chronic pain syndrome   7. Pharmacologic therapy   8. Chronic use of opiate for therapeutic purpose   9. Encounter for medication management   10. Encounter for chronic pain management    Controlled Controlled Controlled   Updated Problems: No problems updated.  Plan of Care  Problem-specific:  Assessment and Plan We will continue on current medication regimen.  Prescribing drug monitoring (PDMP) reviewed; findings consistent with the use of prescribed medication and  no evidence of narcotic misuse or abuse. Routine UDS ordered today.  No new issues or problems reported to this visit.  Schedule follow-up in 90 days for medication management.   Mr. Gryphon Vanderveen. has a current medication list which includes the following long-term medication(s): azelastine, famotidine, loratadine, naloxone , rosuvastatin, [START ON 01/22/2024] oxycodone  hcl, [START ON 02/21/2024] oxycodone  hcl, and [START ON 03/22/2024] oxycodone  hcl.  Pharmacotherapy (Medications Ordered): Meds ordered this encounter  Medications   Oxycodone  HCl 10 MG TABS    Sig: Take 1 tablet (10 mg total) by mouth every 6 (six) hours as needed. Must last 30 days    Dispense:  120 tablet    Refill:  0    DO NOT: delete (not duplicate); no partial-fill (will deny script to complete), no refill request (F/U required). DISPENSE: 1 day early if closed on fill date. WARN: No CNS-depressants within 8 hrs of med.   Oxycodone  HCl 10 MG TABS    Sig: Take 1 tablet (10 mg total) by mouth every 6 (six) hours as needed. Must last 30 days    Dispense:  120 tablet    Refill:  0    DO NOT: delete (not duplicate); no partial-fill (will deny script to complete), no refill request (F/U required). DISPENSE: 1 day early if closed on fill date. WARN: No CNS-depressants within 8 hrs of med.   Oxycodone  HCl 10 MG TABS    Sig: Take 1 tablet (10 mg total) by mouth every 6 (six) hours as needed. Must last 30 days    Dispense:  120 tablet    Refill:  0    DO NOT: delete (not duplicate); no partial-fill (will deny script to complete), no refill request (F/U required). DISPENSE: 1 day early if closed on fill date. WARN: No CNS-depressants within 8 hrs of med.   Orders:  Orders Placed This Encounter  Procedures   ToxASSURE Select 13 (MW), Urine    Volume: 30 ml(s). Minimum 3 ml of urine is needed. Document temperature of fresh sample. Indications: Long term (current) use of opiate analgesic (S20.108)    Release to patient:    Immediate  Return in about 3 months (around 04/19/2024) for (F2F), (MM), Emmy Blanch NP.    Recent Visits No visits were found meeting these conditions. Showing recent visits within past 90 days and meeting all other requirements Today's Visits Date Type Provider Dept  01/18/24 Office Visit Tara Rud K, NP [facility]  Showing today's visits and meeting all other requirements Future Appointments No visits were found meeting these conditions. Showing future appointments within next 90 days and meeting all other requirements  I discussed the assessment and treatment plan with the patient. The patient was provided an opportunity to ask questions and all were answered. The patient agreed with the plan and demonstrated an understanding of the instructions.  Patient advised to call back or seek an in-person evaluation if the symptoms or condition worsens.  Duration of encounter: 30 minutes.  Total time on encounter, as per AMA guidelines included both the face-to-face and non-face-to-face time personally spent by the physician and/or other qualified health care professional(s) on the day of the encounter (includes time in activities that require the physician or other qualified health care professional and does not include time in activities normally performed by clinical staff). Physician's time may include the following activities when performed: Preparing to see the patient (e.g., pre-charting review of records, searching for previously ordered imaging, lab work, and nerve conduction tests) Review of prior analgesic pharmacotherapies. Reviewing PMP Interpreting ordered tests (e.g., lab work, imaging, nerve conduction tests) Performing post-procedure evaluations, including interpretation of diagnostic procedures Obtaining and/or reviewing separately obtained history Performing a medically appropriate examination and/or evaluation Counseling and educating the  patient/family/caregiver Ordering medications, tests, or procedures Referring and communicating with other health care professionals (when not separately reported) Documenting clinical information in the electronic or other health record Independently interpreting results (not separately reported) and communicating results to the patient/ family/caregiver Care coordination (not separately reported)  Note by: Miriam Liles K Lakeyn Dokken, NP (TTS and AI technology used. I apologize for any typographical errors that were not detected and corrected.) Date: 01/18/2024; Time: 2:29 PM

## 2024-01-23 LAB — TOXASSURE SELECT 13 (MW), URINE

## 2024-04-18 ENCOUNTER — Ambulatory Visit: Attending: Nurse Practitioner | Admitting: Nurse Practitioner

## 2024-04-18 ENCOUNTER — Encounter: Payer: Self-pay | Admitting: Nurse Practitioner

## 2024-04-18 DIAGNOSIS — Z79899 Other long term (current) drug therapy: Secondary | ICD-10-CM | POA: Diagnosis present

## 2024-04-18 DIAGNOSIS — M25562 Pain in left knee: Secondary | ICD-10-CM | POA: Diagnosis present

## 2024-04-18 DIAGNOSIS — M25551 Pain in right hip: Secondary | ICD-10-CM | POA: Insufficient documentation

## 2024-04-18 DIAGNOSIS — M25552 Pain in left hip: Secondary | ICD-10-CM | POA: Insufficient documentation

## 2024-04-18 DIAGNOSIS — M79604 Pain in right leg: Secondary | ICD-10-CM | POA: Insufficient documentation

## 2024-04-18 DIAGNOSIS — Z79891 Long term (current) use of opiate analgesic: Secondary | ICD-10-CM | POA: Insufficient documentation

## 2024-04-18 DIAGNOSIS — M545 Low back pain, unspecified: Secondary | ICD-10-CM | POA: Diagnosis present

## 2024-04-18 DIAGNOSIS — M961 Postlaminectomy syndrome, not elsewhere classified: Secondary | ICD-10-CM | POA: Diagnosis present

## 2024-04-18 DIAGNOSIS — M79605 Pain in left leg: Secondary | ICD-10-CM | POA: Insufficient documentation

## 2024-04-18 DIAGNOSIS — G8929 Other chronic pain: Secondary | ICD-10-CM | POA: Insufficient documentation

## 2024-04-18 DIAGNOSIS — G894 Chronic pain syndrome: Secondary | ICD-10-CM | POA: Diagnosis present

## 2024-04-18 MED ORDER — OXYCODONE HCL 10 MG PO TABS: 10.0000 mg | ORAL_TABLET | Freq: Four times a day (QID) | ORAL | 0 refills | Status: DC | PRN

## 2024-04-18 MED ORDER — NALOXONE HCL 4 MG/0.1ML NA LIQD: 1.0000 | NASAL | 0 refills | Status: AC | PRN

## 2024-04-18 NOTE — Progress Notes (Signed)
 PROVIDER NOTE: Interpretation of information contained herein should be left to medically-trained personnel. Specific patient instructions are provided elsewhere under Patient Instructions section of medical record. This document was created in part using AI and STT-dictation technology, any transcriptional errors that may result from this process are unintentional.  Patient: Nathan Lambert.  Service: E/M   PCP: Pecolia Senior, MD  DOB: [birth year]  DOS: 04/18/2024  Provider: Emmy MARLA Blanch, NP  MRN: [id number]  Delivery: Face-to-face  Specialty: Interventional Pain Management  Type: Established Patient  Setting: Ambulatory outpatient facility  Specialty designation: 09  Referring Prov.: Pecolia Senior, MD  Location: Outpatient office facility       History of present illness (HPI) [name], a 43 y.o. year old male, is here today because of his back pain. Nathan Lambert primary complain today is Back Pain  Pertinent problems: 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  Pain Assessment: Severity of Chronic pain is reported as a 5 /10. Location: Back Lower/Denies. Onset: More than a month ago. Quality: Aching. Timing: Constant. Modifying factor(s): Rest, Heat. Vitals:  height is 5' 11 (1.803 m) and weight is 280 lb (127 kg). His temporal temperature is 98.1 F (36.7 C). His blood pressure is 124/84 and his pulse is 82. His respiration is 20 and oxygen saturation is 100%.  BMI: Estimated body mass index is 39.05 kg/m as calculated from the following:   Height as of this encounter: 5' 11 (1.803 m).   Weight as of this encounter: 280 lb (127 kg).  Last encounter: 01/18/2024. Last procedure: Visit date not found.  Reason for  encounter: medication management.  The patient indicates doing well with current medication regimen.  No adverse reaction or side effects reported to medication.  His pharmacy remains unchanged, and he has brought his medication for pill count.  The patient continues experiencing low back pain; however it is well-managed with his current pain medication regimen.  He is also recovering from recent removal of Pilonidal cyst removed by Dr. Jerilee at [facility].  Pharmacotherapy Assessment   Oxycodone  HCl (IR) 10 mg tablet every 6 hours as needed for pain. MME=60 Monitoring: [place] PMP: PDMP reviewed during this encounter.       Pharmacotherapy: No side-effects or adverse reactions reported. Compliance: No problems identified. Effectiveness: Clinically acceptable.  Nathan Lambert, [place]  04/18/2024  1:33 PM  Sign when Signing Visit Nursing Pain Medication Assessment:  Safety precautions to be maintained throughout the outpatient stay will include: orient to surroundings, keep bed in low position, maintain call bell within reach at all times, provide assistance with transfer out of bed and ambulation.  Medication Inspection Compliance: Pill count conducted under aseptic conditions, in front of the patient. Neither the pills nor the bottle was removed from the patient's sight at any time. Once count was completed pills were immediately returned to the patient in their original bottle.  Medication: Oxycodone  IR Pill/Patch Count: 15 of 120 pills/patches remain Pill/Patch Appearance: Markings consistent with prescribed medication Bottle Appearance: Standard pharmacy container. Clearly labeled. Filled Date: 09 / 05 / 2025 Last Medication intake:  Today    UDS:  Summary  Date Value Ref Range Status  01/18/2024 FINAL  Final    Comment:    ==================================================================== [employer] [facility] 13 (MW) ==================================================================== Test  Result       Flag       Units  Drug Present and Declared for Prescription Verification   Oxycodone                       1190         EXPECTED   ng/mg creat   Oxymorphone                    550          EXPECTED   ng/mg creat   Noroxycodone                   2562         EXPECTED   ng/mg creat   Noroxymorphone                 124          EXPECTED   ng/mg creat    Sources of oxycodone  are scheduled prescription medications.    Oxymorphone, noroxycodone, and noroxymorphone are expected    metabolites of oxycodone . Oxymorphone is also available as a    scheduled prescription medication.  ==================================================================== Test                      Result    Flag   Units      Ref Range   Creatinine              186              mg/dL      >=79 ==================================================================== Declared Medications:  The flagging and interpretation on this report are based on the  following declared medications.  Unexpected results may arise from  inaccuracies in the declared medications.   **Note: The testing scope of this panel includes these medications:   Oxycodone    **Note: The testing scope of this panel does not include the  following reported medications:   Azelastine (Astelin)  Clobetasol (Temovate)  Doxycycline   Econazole  Famotidine (Pepcid)  Gentamicin  Loratadine (Claritin)  Naloxone  (Narcan )  Rosuvastatin (Crestor)  Semaglutide (Ozempic)  Terbinafine (Lamisil)  Testosterone  ==================================================================== For clinical consultation, please call [phone]. ====================================================================     No results found for: CBDTHCR No results found for: D8THCCBX No results found for: D9THCCBX  ROS  Constitutional: Denies any fever or chills Gastrointestinal: No reported hemesis, hematochezia, vomiting, or acute GI  distress Musculoskeletal: Lower back pain Neurological: No reported episodes of acute onset apraxia, aphasia, dysarthria, agnosia, amnesia, paralysis, loss of coordination, or loss of consciousness  Medication Review  Magnesium  Oxide -Mg Supplement, Oxycodone  HCl, Semaglutide(0.25 or 0.5MG /DOS), azelastine, clobetasol ointment, doxycycline , econazole nitrate, famotidine, gentamicin cream, glipiZIDE, loratadine, metFORMIN, naloxone , rosuvastatin, terbinafine, testosterone  cypionate, and triamcinolone  ointment  History Review  Allergy: Mr. Mays is allergic to bactrim [sulfamethoxazole-trimethoprim], pollen extract, sulfa antibiotics, and penicillins. Drug: Mr. Viglione  reports no history of drug use. Alcohol:  reports no history of alcohol use. Tobacco:  reports that he has quit smoking. His smoking use included cigarettes. He has a 10 pack-year smoking history. He has never used smokeless tobacco. Social: Mr. Julian  reports that he has quit smoking. His smoking use included cigarettes. He has a 10 pack-year smoking history. He has never used smokeless tobacco. He reports that he does not drink alcohol and does not use drugs. Medical:  has a past medical history of Allergy (2012), Chronic low back pain (Primary Area  of Pain) (Bilateral) (R>L), Degeneration of lumbar or lumbosacral intervertebral disc (L4-L5) (12/27/2016), Gastroesophageal reflux disease without esophagitis (10/26/2021), Headache, Mixed hyperlipidemia (10/26/2021), and Opiate use (02/15/2017). Surgical: Mr. Oyster  has a past surgical history that includes Fracture surgery (Left); Spine surgery (Right, 06/16/2015); and Back surgery. Family: family history includes AAA (abdominal aortic aneurysm) in his mother; Diabetes in his father; Stroke in his mother.  Laboratory Chemistry Profile   Renal Lab Results  Component Value Date   BUN 11 07/20/2023   CREATININE 0.90 07/20/2023   BCR 7 (L) 12/27/2016   GFRAA >60  06/07/2019   GFRNONAA >60 07/20/2023    Hepatic Lab Results  Component Value Date   AST 16 05/23/2022   ALT 12 05/23/2022   ALBUMIN 4.0 05/23/2022   ALKPHOS 51 05/23/2022    Electrolytes Lab Results  Component Value Date   NA 138 07/20/2023   K 4.3 07/20/2023   CL 105 07/20/2023   CALCIUM  9.2 07/20/2023   MG 2.0 06/07/2019    Bone Lab Results  Component Value Date   VD25OH 15.51 (L) 06/07/2019   25OHVITD1 17 (L) 12/27/2016   25OHVITD2 <1.0 12/27/2016   25OHVITD3 17 12/27/2016   TESTOFREE 2.6 (L) 06/08/2017   TESTOSTERONE  116 (L) 06/08/2017    Inflammation (CRP: Acute Phase) (ESR: Chronic Phase) Lab Results  Component Value Date   CRP 2.7 (H) 06/07/2019   ESRSEDRATE 17 (H) 06/07/2019         Note: Above Lab results reviewed.  Recent Imaging Review  CT Soft Tissue Neck W Contrast CLINICAL DATA:  Soft tissue infection suspected, neck, xray done  EXAM: CT NECK WITH CONTRAST  TECHNIQUE: Multidetector CT imaging of the neck was performed using the standard protocol following the bolus administration of intravenous contrast.  RADIATION DOSE REDUCTION: This exam was performed according to the departmental dose-optimization program which includes automated exposure control, adjustment of the mA and/or kV according to patient size and/or use of iterative reconstruction technique.  CONTRAST:  75mL OMNIPAQUE  IOHEXOL  300 MG/ML  SOLN  COMPARISON:  None Available.  FINDINGS: Pharynx and larynx: Normal. No mass or swelling.  Salivary glands: No inflammation, mass, or stone.  Thyroid: Normal.  Lymph nodes: Two paramidline mildly enlarged 1.1 cm level I lymph nodes. The left-sided node is hypodense suggesting possible suppurative change.  Vascular: Nondiagnostic evaluation due to non arterial timing.  Limited intracranial: Negative.  Visualized orbits: Negative.  Mastoids and visualized paranasal sinuses: Clear.  Skeleton: No acute fracture on limited  assessment.  Upper chest: Visualized lung apices are clear.  Other: Approximately 1.7 cm peripherally enhancing fluid collection in the superficial subcutaneous soft tissues of the chin (for example see series 2, image 83 and series 5, image 54). Surrounding edema and fat stranding. Thickening of the adjacent platysma.  IMPRESSION: 1. Approximately 1.7 cm peripherally enhancing fluid collection in the superficial subcutaneous soft tissues of the chin, compatible with abscess. Surrounding edema/cellulitis. 2. Two paramidline mildly enlarged 1.1 cm level I lymph nodes may be reactive given the above findings. The left-sided node is hypodense suggesting possible early suppurative change. Recommend clinical follow-up to ensure improvement with treatment.  Electronically Signed   By: Gilmore GORMAN Molt M.D.   On: 07/21/2023 00:48 Note: Reviewed        Physical Exam  Vitals: BP 124/84 (BP Location: Right Arm, Patient Position: Sitting)   Pulse 82   Temp 98.1 F (36.7 C) (Temporal)   Resp 20   Ht 5' 11 (1.803 m)  Wt 280 lb (127 kg)   SpO2 100%   BMI 39.05 kg/m  BMI: Estimated body mass index is 39.05 kg/m as calculated from the following:   Height as of this encounter: 5' 11 (1.803 m).   Weight as of this encounter: 280 lb (127 kg). Ideal: Ideal body weight: 75.3 kg (166 lb 0.1 oz) Adjusted ideal body weight: 96 kg (211 lb 9.7 oz) General appearance: Well nourished, well developed, and well hydrated. In no apparent acute distress Mental status: Alert, oriented x 3 (person, place, & time)       Respiratory: No evidence of acute respiratory distress Eyes: PERLA  Musculoskeletal: Lower back pain  Assessment   Diagnosis Status  1. Chronic pain syndrome   2. Chronic use of opiate for therapeutic purpose   3. Chronic low back pain (1ry area of Pain) (Bilateral) w/o sciatica   4. Chronic knee pain (2ry area of Pain) (Left)   5. Chronic lower extremity pain (3ry area of Pain)  (Bilateral) (L>R)   6. 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  Plan of Care  Problem-specific:  Assessment and Plan  Chronic pain syndrome: Patient's pain is well-controlled with oxycodone  10 mg, we will continue on current pain medication regimen.  Prescribing drug monitoring (PDMP) reviewed; findings consistent with the use of prescribed medication and no evidence of narcotic misuse or abuse.  Urine drug screening (UDS) up to date.  Schedule follow-up in 90 days for medication management.  Chronic low back pain: The patient continues experiencing low back pain; however, he is recovering from recent removal of Pilonidal cyst removed by Dr. Jerilee at [facility].  He contributes the pain from the removal of cyst.   Mr. Kailin Leu. has a current medication list which includes the following long-term medication(s): azelastine, famotidine, loratadine, naloxone , rosuvastatin, [START ON 04/22/2024] oxycodone  hcl, [START ON 05/22/2024] oxycodone  hcl, and [START ON 06/21/2024] oxycodone  hcl.  Pharmacotherapy (Medications Ordered): Meds ordered this encounter  Medications   Oxycodone  HCl 10 MG TABS    Sig: Take 1 tablet (10 mg total) by mouth every 6 (six) hours as needed. Must last 30 days    Dispense:  120 tablet    Refill:  0    DO NOT: delete (not duplicate); no partial-fill (will deny script to complete), no refill request (F/U required). DISPENSE: 1 day early if closed on fill date. WARN: No CNS-depressants within 8 hrs of med.   Oxycodone  HCl 10 MG TABS    Sig: Take 1 tablet (10 mg total) by mouth every 6 (six) hours as needed. Must last 30 days    Dispense:  120 tablet    Refill:  0    DO NOT: delete (not duplicate); no partial-fill (will deny script to complete), no refill request (F/U required). DISPENSE: 1 day early if closed on  fill date. WARN: No CNS-depressants within 8 hrs of med.   Oxycodone  HCl 10 MG TABS    Sig: Take 1 tablet (10 mg total) by mouth every 6 (six) hours as needed. Must last 30 days    Dispense:  120 tablet    Refill:  0    DO NOT: delete (not duplicate); no partial-fill (will deny script to complete), no refill request (F/U required). DISPENSE: 1 day early if closed on fill date. WARN: No CNS-depressants within 8 hrs  of med.   Orders:  No orders of the defined types were placed in this encounter.       Return in about 3 months (around 07/19/2024) for (F2F), (MM), Emmy Blanch NP.    Recent Visits No visits were found meeting these conditions. Showing recent visits within past 90 days and meeting all other requirements Today's Visits Date Type Provider Dept  04/18/24 Office Visit Amine Adelson K, NP [facility]  Showing today's visits and meeting all other requirements Future Appointments Date Type Provider Dept  07/17/24 Appointment Mykenzie Ebanks K, NP [facility]  Showing future appointments within next 90 days and meeting all other requirements  I discussed the assessment and treatment plan with the patient. The patient was provided an opportunity to ask questions and all were answered. The patient agreed with the plan and demonstrated an understanding of the instructions.  Patient advised to call back or seek an in-person evaluation if the symptoms or condition worsens.  I personally spent a total of 30 minutes in the care of the patient today including preparing to see the patient, getting/reviewing separately obtained history, performing a medically appropriate exam/evaluation, counseling and educating, placing orders, referring and communicating with other health care professionals, documenting clinical information in the EHR, independently interpreting results, communicating results, and coordinating care.   Note by: Emmy MARLA Blanch, NP  Date: 04/18/2024; Time: 2:47  PM

## 2024-04-18 NOTE — Progress Notes (Signed)
 Nursing Pain Medication Assessment:  Safety precautions to be maintained throughout the outpatient stay will include: orient to surroundings, keep bed in low position, maintain call bell within reach at all times, provide assistance with transfer out of bed and ambulation.  Medication Inspection Compliance: Pill count conducted under aseptic conditions, in front of the patient. Neither the pills nor the bottle was removed from the patient's sight at any time. Once count was completed pills were immediately returned to the patient in their original bottle.  Medication: Oxycodone  IR Pill/Patch Count: 15 of 120 pills/patches remain Pill/Patch Appearance: Markings consistent with prescribed medication Bottle Appearance: Standard pharmacy container. Clearly labeled. Filled Date: 09 / 05 / 2025 Last Medication intake:  Today

## 2024-07-15 NOTE — Progress Notes (Unsigned)
 PROVIDER NOTE: Interpretation of information contained herein should be left to medically-trained personnel. Specific patient instructions are provided elsewhere under Patient Instructions section of medical record. This document was created in part using AI and STT-dictation technology, any transcriptional errors that may result from this process are unintentional.  Patient: Nathan Lambert Nathan Lambert.  Service: E/M   PCP: Pecolia Senior, MD  DOB: 1980-07-29  DOS: 07/17/2024  Provider: Emmy MARLA Blanch, NP  MRN: [id number]  Delivery: Face-to-face  Specialty: Interventional Pain Management  Type: Established Patient  Setting: Ambulatory outpatient facility  Specialty designation: 09  Referring Prov.: Pecolia Senior, MD  Location: Outpatient office facility       History of present illness (HPI) Mr. Nathan Ruffins., a 43 y.o. year old male, is here today because of his low back pain. 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); Chronic lower extremity pain (3ry area of Pain) (Bilateral) (L>R); Long term (current) use of opiate analgesic; Long term prescription opiate use; Opiate use (60 MME/Day); Lumbar facet hypertrophy (Bilateral); Lumbar facet syndrome (Bilateral) (R>L); Neurogenic pain; Spondylosis without myelopathy or radiculopathy, lumbar region; Other specified dorsopathies, sacral and sacrococcygeal region; Lumbar spondylosis; Disorder of skeletal system; Pharmacologic therapy; Problems  influencing health status; Chronic upper extremity pain (Left); Pain and numbness of left upper extremity; DDD (degenerative disc disease), cervical; Cervicalgia; Osteoarthritis involving multiple joints; Chronic hip pain (4th area of Pain) (Bilateral) (L>R); Lumbar back pain with radiculopathy affecting left lower extremity (L5 dermatomal distribution); Subacute lumbar radiculopathy (L5) (Left); Chronic low back pain (1ry area of Pain) (Bilateral) w/o sciatica; Chronic use of opiate for therapeutic purpose; Chronic hip pain (Left); Enthesopathy of hip region (Left); Other bursitis of hip (Left); and Chronic groin pain (Left) on their pertinent problem list.  Pain Assessment: Severity of Chronic pain is reported as a 3 /10. Location: Back Lower/Denies. Onset: More than a month ago. Quality: Aching, Sharp, Stabbing. Timing: Constant. Modifying factor(s): Denies. Vitals:  height is 5' 11 (1.803 m) and weight is 300 lb (136.1 kg). His temporal temperature is 98.2 F (36.8 C). His blood pressure is 144/89 (abnormal) and his pulse is 80. His respiration is 20 and oxygen saturation is 98%.  BMI: Estimated body mass index is 41.84 kg/m as calculated from the following:   Height as of this encounter: 5' 11 (1.803 m).   Weight as of this encounter: 300 lb (136.1 kg).  Last encounter: 04/18/2024. Last procedure: Visit date not found.  Reason for encounter: medication management.  The patient indicates doing well with current medication regimen.  No adverse reaction or side effects reported to medication.  His pharmacy remains unchanged, and he has brought his medication for pill count.  The patient continues experiencing low back pain; however it is well-managed with his current pain medication regimen.   Discussed the use of AI scribe software for clinical note transcription with the patient, who gave verbal consent to proceed.  History of Present Illness   Nathan Loiseau. is a 43 year old male  who presents for pain management of chronic back pain.  He experiences chronic back  pain localized across his back, described as stabbing and exacerbated by bending and twisting movements. Coughing can lead to weakness in his knees and increased back pain, sometimes causing him to feel as though he might buckle.  He has a history of receiving lumbar facet blocks and Lumbar RFA with the last procedure performed in 2019. He underwent radiofrequency ablation in 2019, which involved the insertion of multiple needles into his back. He has had these procedures done multiple times, but he is currently managing his pain primarily with medication.  He takes pain medication every six hours and experiences constipation as a side effect. To manage this, he uses Miralax, an over-the-counter stool softener.    Pharmacotherapy Assessment   Oxycodone  HCl (IR) 10 mg tablet every 6 hours as needed for pain. MME=60 Monitoring: Nathan Lambert PMP: PDMP reviewed during this encounter.       Pharmacotherapy: No side-effects or adverse reactions reported. Compliance: No problems identified. Effectiveness: Clinically acceptable.  Nathan Lambert, Nathan Lambert  07/17/2024  9:58 AM  Sign when Signing Visit Nursing Pain Medication Assessment:  Safety precautions to be maintained throughout the outpatient stay will include: orient to surroundings, keep bed in low position, maintain call bell within reach at all times, provide assistance with transfer out of bed and ambulation.  Medication Inspection Compliance: Pill count conducted under aseptic conditions, in front of the patient. Neither the pills nor the bottle was removed from the patient's sight at any time. Once count was completed pills were immediately returned to the patient in their original bottle.  Medication: Oxycodone  IR Pill/Patch Count: 17 of 120 pills/patches remain Pill/Patch Appearance: Markings consistent with prescribed medication Bottle Appearance: Standard pharmacy  container. Clearly labeled. Filled Date: 19 / 04 / 2025 Last Medication intake:  Today    UDS:  Summary  Date Value Ref Range Status  01/18/2024 FINAL  Final    Comment:    ==================================================================== [employer] [facility] (MW) ==================================================================== Test                             Result       Flag       Units  Drug Present and Declared for Prescription Verification   Oxycodone                       1190         EXPECTED   ng/mg creat   Oxymorphone                    550          EXPECTED   ng/mg creat   Noroxycodone                   2562         EXPECTED   ng/mg creat   Noroxymorphone                 124          EXPECTED   ng/mg creat    Sources of oxycodone  are scheduled prescription medications.    Oxymorphone, noroxycodone, and noroxymorphone are expected    metabolites of oxycodone . Oxymorphone is also available as a    scheduled prescription medication.  ==================================================================== Test                      Result    Flag   Units  Ref Range   Creatinine              186              mg/dL      >=79 ==================================================================== Declared Medications:  The flagging and interpretation on this report are based on the  following declared medications.  Unexpected results may arise from  inaccuracies in the declared medications.   **Note: The testing scope of this panel includes these medications:   Oxycodone    **Note: The testing scope of this panel does not include the  following reported medications:   Azelastine (Astelin)  Clobetasol (Temovate)  Doxycycline   Econazole  Famotidine (Pepcid)  Gentamicin  Loratadine (Claritin)  Naloxone  (Narcan )  Rosuvastatin (Crestor)  Semaglutide (Ozempic)  Terbinafine (Lamisil)   Testosterone  ==================================================================== For clinical consultation, please call [phone]. ====================================================================     No results found for: CBDTHCR No results found for: D8THCCBX No results found for: D9THCCBX  ROS  Constitutional: Denies any fever or chills Gastrointestinal: No reported hemesis, hematochezia, vomiting, or acute GI distress Musculoskeletal: Lower back pain Neurological: No reported episodes of acute onset apraxia, aphasia, dysarthria, agnosia, amnesia, paralysis, loss of coordination, or loss of consciousness  Medication Review  Magnesium  Oxide -Mg Supplement, Oxycodone  HCl, Semaglutide(0.25 or 0.5MG /DOS), azelastine, clobetasol ointment, doxycycline , econazole nitrate, famotidine, gentamicin cream, glipiZIDE, loratadine, metFORMIN, naloxone , rosuvastatin, terbinafine, testosterone  cypionate, and triamcinolone  ointment  History Review  Allergy: Mr. Besecker is allergic to bactrim [sulfamethoxazole-trimethoprim], pollen extract, sulfa antibiotics, and penicillins. Drug: Mr. Soffer  reports no history of drug use. Alcohol:  reports no history of alcohol use. Tobacco:  reports that he has quit smoking. His smoking use included cigarettes. He has a 10 pack-year smoking history. He has never used smokeless tobacco. Social: Mr. Deshmukh  reports that he has quit smoking. His smoking use included cigarettes. He has a 10 pack-year smoking history. He has never used smokeless tobacco. He reports that he does not drink alcohol and does not use drugs. Medical:  has a past medical history of Allergy (2012), Chronic low back pain (Primary Area of Pain) (Bilateral) (R>L), Degeneration of lumbar or lumbosacral intervertebral disc (L4-L5) (12/27/2016), Gastroesophageal reflux disease without esophagitis (10/26/2021), Headache, Mixed hyperlipidemia (10/26/2021), and Opiate use  (02/15/2017). Surgical: Mr. Kittell  has a past surgical history that includes Fracture surgery (Left); Spine surgery (Right, 06/16/2015); and Back surgery. Family: family history includes AAA (abdominal aortic aneurysm) in his mother; Diabetes in his father; Stroke in his mother.  Laboratory Chemistry Profile   Renal Lab Results  Component Value Date   BUN 11 07/20/2023   CREATININE 0.90 07/20/2023   BCR 7 (L) 12/27/2016   GFRAA >60 06/07/2019   GFRNONAA >60 07/20/2023    Hepatic Lab Results  Component Value Date   AST 16 05/23/2022   ALT 12 05/23/2022   ALBUMIN 4.0 05/23/2022   ALKPHOS 51 05/23/2022    Electrolytes Lab Results  Component Value Date   NA 138 07/20/2023   K 4.3 07/20/2023   CL 105 07/20/2023   CALCIUM  9.2 07/20/2023   MG 2.0 06/07/2019    Bone Lab Results  Component Value Date   VD25OH 15.51 (L) 06/07/2019   25OHVITD1 17 (L) 12/27/2016   25OHVITD2 <1.0 12/27/2016   25OHVITD3 17 12/27/2016   TESTOFREE 2.6 (L) 06/08/2017   TESTOSTERONE  116 (L) 06/08/2017    Inflammation (CRP: Acute Phase) (ESR: Chronic Phase) Lab Results  Component Value Date   CRP 2.7 (H) 06/07/2019   ESRSEDRATE  17 (H) 06/07/2019         Note: Above Lab results reviewed.  Recent Imaging Review  CT Soft Tissue Neck W Contrast CLINICAL DATA:  Soft tissue infection suspected, neck, xray done  EXAM: CT NECK WITH CONTRAST  TECHNIQUE: Multidetector CT imaging of the neck was performed using the standard protocol following the bolus administration of intravenous contrast.  RADIATION DOSE REDUCTION: This exam was performed according to the departmental dose-optimization program which includes automated exposure control, adjustment of the mA and/or kV according to patient size and/or use of iterative reconstruction technique.  CONTRAST:  75mL OMNIPAQUE  IOHEXOL  300 MG/ML  SOLN  COMPARISON:  None Available.  FINDINGS: Pharynx and larynx: Normal. No mass or  swelling.  Salivary glands: No inflammation, mass, or stone.  Thyroid: Normal.  Lymph nodes: Two paramidline mildly enlarged 1.1 cm level I lymph nodes. The left-sided node is hypodense suggesting possible suppurative change.  Vascular: Nondiagnostic evaluation due to non arterial timing.  Limited intracranial: Negative.  Visualized orbits: Negative.  Mastoids and visualized paranasal sinuses: Clear.  Skeleton: No acute fracture on limited assessment.  Upper chest: Visualized lung apices are clear.  Other: Approximately 1.7 cm peripherally enhancing fluid collection in the superficial subcutaneous soft tissues of the chin (for example see series 2, image 83 and series 5, image 54). Surrounding edema and fat stranding. Thickening of the adjacent platysma.  IMPRESSION: 1. Approximately 1.7 cm peripherally enhancing fluid collection in the superficial subcutaneous soft tissues of the chin, compatible with abscess. Surrounding edema/cellulitis. 2. Two paramidline mildly enlarged 1.1 cm level I lymph nodes may be reactive given the above findings. The left-sided node is hypodense suggesting possible early suppurative change. Recommend clinical follow-up to ensure improvement with treatment.  Electronically Signed   By: Gilmore GORMAN Molt M.D.   On: 07/21/2023 00:48 Note: Reviewed        Physical Exam  Vitals: BP (!) 144/89 (BP Location: Left Arm, Patient Position: Sitting, Cuff Size: Normal)   Pulse 80   Temp 98.2 F (36.8 C) (Temporal)   Resp 20   Ht 5' 11 (1.803 m)   Wt 300 lb (136.1 kg)   SpO2 98%   BMI 41.84 kg/m  BMI: Estimated body mass index is 41.84 kg/m as calculated from the following:   Height as of this encounter: 5' 11 (1.803 m).   Weight as of this encounter: 300 lb (136.1 kg). Ideal: Ideal body weight: 75.3 kg (166 lb 0.1 oz) Adjusted ideal body weight: 99.6 kg (219 lb 9.7 oz) General appearance: Well nourished, well developed, and well hydrated.  In no apparent acute distress Mental status: Alert, oriented x 3 (person, place, & time)       Respiratory: No evidence of acute respiratory distress Eyes: PERLA  Musculoskeletal: LBP Assessment   Diagnosis Status  1. Chronic low back pain (1ry area of Pain) (Bilateral) w/o sciatica   2. Medication management   3. Chronic pain syndrome   4. Chronic knee pain (2ry area of Pain) (Left)   5. Chronic lower extremity pain (3ry area of Pain) (Bilateral) (L>R)   6. Chronic hip pain (4th area of Pain) (Bilateral) (L>R)   7. 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 Chronic use of opiate for therapeutic purpose    Controlled Controlled Controlled   Updated Problems: No problems updated.  Plan of Care  Problem-specific:  Assessment and Plan    Chronic low back pain Exacerbated by bending and twisting, likely due to facet  joint inflammation. Previous lumbar facet block and radiofrequency ablation performed. - Continue current medication regimen for pain management. - Consider repeating lumbar facet block if pain worsens.  Chronic pain syndrome Managed with opioid therapy. Pain primarily in lower back, exacerbated by certain movements. - Continue current opioid therapy regimen.  Opioid therapy for chronic pain Opioid therapy used for chronic pain management. No significant adverse reactions reported except for constipation. - Continue opioid therapy as prescribed. Patient's pain is well-controlled with oxycodone  10 mg, we will continue on current pain medication regimen.  Prescribing drug monitoring (PDMP) reviewed; findings consistent with the use of prescribed medication and no evidence of narcotic misuse or abuse.  Urine drug screening (UDS) up to date.  Schedule follow-up in 90 days for medication management.   Opioid-induced constipation Managed with over-the-counter stool softeners. Reports using Miralax. - Continue using Miralax for constipation management.        Mr. Mario Coronado. has a current medication list which includes the following long-term medication(s): azelastine, famotidine, loratadine, naloxone , rosuvastatin, [START ON 07/21/2024] oxycodone  hcl, [START ON 08/20/2024] oxycodone  hcl, and [START ON 09/19/2024] oxycodone  hcl.  Pharmacotherapy (Medications Ordered): Meds ordered this encounter  Medications   Oxycodone  HCl 10 MG TABS    Sig: Take 1 tablet (10 mg total) by mouth every 6 (six) hours as needed. Must last 30 days    Dispense:  120 tablet    Refill:  0    DO NOT: delete (not duplicate); no partial-fill (will deny script to complete), no refill request (F/U required). DISPENSE: 1 day early if closed on fill date. WARN: No CNS-depressants within 8 hrs of med.   Oxycodone  HCl 10 MG TABS    Sig: Take 1 tablet (10 mg total) by mouth every 6 (six) hours as needed. Must last 30 days    Dispense:  120 tablet    Refill:  0    DO NOT: delete (not duplicate); no partial-fill (will deny script to complete), no refill request (F/U required). DISPENSE: 1 day early if closed on fill date. WARN: No CNS-depressants within 8 hrs of med.   Oxycodone  HCl 10 MG TABS    Sig: Take 1 tablet (10 mg total) by mouth every 6 (six) hours as needed. Must last 30 days    Dispense:  120 tablet    Refill:  0    DO NOT: delete (not duplicate); no partial-fill (will deny script to complete), no refill request (F/U required). DISPENSE: 1 day early if closed on fill date. WARN: No CNS-depressants within 8 hrs of med.   Orders:  No orders of the defined types were placed in this encounter.       Return in about 3 months (around 10/15/2024) for (F2F), (MM), Emmy Blanch NP.    Recent Visits Date Type Provider Dept  04/18/24 Office Visit Zenaya Ulatowski K, NP [facility]  Showing recent visits within past 90 days and meeting all other requirements Today's Visits Date Type Provider Dept  07/17/24 Office Visit Macio Kissoon K, NP [facility]  Showing today's visits and meeting all other requirements Future Appointments Date Type Provider Dept  10/15/24 Appointment Tanaja Ganger K, NP [facility]  Showing future appointments within next 90 days and meeting all other requirements  I discussed the assessment and treatment plan with the patient. The patient was provided an opportunity to ask questions and all were answered. The patient agreed with the plan and demonstrated an understanding of  the instructions.  Patient advised to call back or seek an in-person evaluation if the symptoms or condition worsens.  I personally spent a total of 30 minutes in the care of the patient today including preparing to see the patient, getting/reviewing separately obtained history, performing a medically appropriate exam/evaluation, counseling and educating, placing orders, referring and communicating with other health care professionals, documenting clinical information in the EHR, independently interpreting results, communicating results, and coordinating care.   Note by: Carle Dargan K Everlie Eble, NP (TTS and AI technology used. I apologize for any typographical errors that were not detected and corrected.) Date: 07/17/2024; Time: 10:26 AM

## 2024-07-17 ENCOUNTER — Encounter: Payer: Self-pay | Admitting: Nurse Practitioner

## 2024-07-17 ENCOUNTER — Ambulatory Visit: Admitting: Nurse Practitioner

## 2024-07-17 VITALS — BP 144/89 | HR 80 | Temp 98.2°F | Resp 20 | Ht 71.0 in | Wt 300.0 lb

## 2024-07-17 DIAGNOSIS — G894 Chronic pain syndrome: Secondary | ICD-10-CM | POA: Insufficient documentation

## 2024-07-17 DIAGNOSIS — M961 Postlaminectomy syndrome, not elsewhere classified: Secondary | ICD-10-CM | POA: Diagnosis present

## 2024-07-17 DIAGNOSIS — Z79899 Other long term (current) drug therapy: Secondary | ICD-10-CM | POA: Insufficient documentation

## 2024-07-17 DIAGNOSIS — M25552 Pain in left hip: Secondary | ICD-10-CM | POA: Diagnosis present

## 2024-07-17 DIAGNOSIS — G8929 Other chronic pain: Secondary | ICD-10-CM | POA: Insufficient documentation

## 2024-07-17 DIAGNOSIS — M545 Low back pain, unspecified: Secondary | ICD-10-CM | POA: Diagnosis present

## 2024-07-17 DIAGNOSIS — M25562 Pain in left knee: Secondary | ICD-10-CM | POA: Diagnosis present

## 2024-07-17 DIAGNOSIS — Z79891 Long term (current) use of opiate analgesic: Secondary | ICD-10-CM | POA: Diagnosis present

## 2024-07-17 DIAGNOSIS — M25551 Pain in right hip: Secondary | ICD-10-CM | POA: Diagnosis present

## 2024-07-17 DIAGNOSIS — M79604 Pain in right leg: Secondary | ICD-10-CM | POA: Diagnosis present

## 2024-07-17 DIAGNOSIS — M79605 Pain in left leg: Secondary | ICD-10-CM | POA: Diagnosis present

## 2024-07-17 MED ORDER — OXYCODONE HCL 10 MG PO TABS: 10.0000 mg | ORAL_TABLET | Freq: Four times a day (QID) | ORAL | 0 refills | Status: AC | PRN

## 2024-07-17 NOTE — Patient Instructions (Signed)

## 2024-07-17 NOTE — Progress Notes (Signed)
 Nursing Pain Medication Assessment:  Safety precautions to be maintained throughout the outpatient stay will include: orient to surroundings, keep bed in low position, maintain call bell within reach at all times, provide assistance with transfer out of bed and ambulation.  Medication Inspection Compliance: Pill count conducted under aseptic conditions, in front of the patient. Neither the pills nor the bottle was removed from the patient's sight at any time. Once count was completed pills were immediately returned to the patient in their original bottle.  Medication: Oxycodone  IR Pill/Patch Count: 17 of 120 pills/patches remain Pill/Patch Appearance: Markings consistent with prescribed medication Bottle Appearance: Standard pharmacy container. Clearly labeled. Filled Date: 71 / 04 / 2025 Last Medication intake:  Today

## 2024-10-15 ENCOUNTER — Encounter: Admitting: Nurse Practitioner
